# Patient Record
Sex: Female | Born: 1935 | Race: White | Hispanic: No | Marital: Married | Smoking: Never smoker
Health system: Southern US, Community
[De-identification: ages and names within clinical notes are randomized; demographics above are authoritative.]

## PROBLEM LIST (undated history)

## (undated) DIAGNOSIS — N189 Chronic kidney disease, unspecified: Secondary | ICD-10-CM

## (undated) DIAGNOSIS — E876 Hypokalemia: Secondary | ICD-10-CM

## (undated) DIAGNOSIS — G4733 Obstructive sleep apnea (adult) (pediatric): Principal | ICD-10-CM

## (undated) DIAGNOSIS — I4891 Unspecified atrial fibrillation: Secondary | ICD-10-CM

## (undated) DIAGNOSIS — I499 Cardiac arrhythmia, unspecified: Secondary | ICD-10-CM

## (undated) DIAGNOSIS — C49A2 Gastrointestinal stromal tumor of stomach: Secondary | ICD-10-CM

## (undated) DIAGNOSIS — Z9989 Dependence on other enabling machines and devices: Principal | ICD-10-CM

## (undated) DIAGNOSIS — D414 Neoplasm of uncertain behavior of bladder: Secondary | ICD-10-CM

## (undated) DIAGNOSIS — I10 Essential (primary) hypertension: Secondary | ICD-10-CM

## (undated) DIAGNOSIS — R7301 Impaired fasting glucose: Secondary | ICD-10-CM

## (undated) DIAGNOSIS — N841 Polyp of cervix uteri: Secondary | ICD-10-CM

## (undated) DIAGNOSIS — H269 Unspecified cataract: Secondary | ICD-10-CM

## (undated) DIAGNOSIS — I214 Non-ST elevation (NSTEMI) myocardial infarction: Secondary | ICD-10-CM

## (undated) DIAGNOSIS — M1990 Unspecified osteoarthritis, unspecified site: Secondary | ICD-10-CM

## (undated) DIAGNOSIS — K589 Irritable bowel syndrome without diarrhea: Secondary | ICD-10-CM

## (undated) DIAGNOSIS — M8580 Other specified disorders of bone density and structure, unspecified site: Secondary | ICD-10-CM

## (undated) DIAGNOSIS — I2510 Atherosclerotic heart disease of native coronary artery without angina pectoris: Secondary | ICD-10-CM

## (undated) DIAGNOSIS — K449 Diaphragmatic hernia without obstruction or gangrene: Secondary | ICD-10-CM

## (undated) DIAGNOSIS — E785 Hyperlipidemia, unspecified: Secondary | ICD-10-CM

## (undated) DIAGNOSIS — C801 Malignant (primary) neoplasm, unspecified: Secondary | ICD-10-CM

## (undated) DIAGNOSIS — R32 Unspecified urinary incontinence: Secondary | ICD-10-CM

## (undated) DIAGNOSIS — K219 Gastro-esophageal reflux disease without esophagitis: Secondary | ICD-10-CM

## (undated) HISTORY — DX: Non-ST elevation (NSTEMI) myocardial infarction: I21.4

## (undated) HISTORY — PX: COLONOSCOPY: SHX174

## (undated) HISTORY — DX: Irritable bowel syndrome, unspecified: K58.9

## (undated) HISTORY — DX: Dependence on other enabling machines and devices: Z99.89

## (undated) HISTORY — PX: UPPER GI ENDOSCOPY: SHX6162

## (undated) HISTORY — DX: Impaired fasting glucose: R73.01

## (undated) HISTORY — DX: Neoplasm of uncertain behavior of bladder: D41.4

## (undated) HISTORY — DX: Other specified disorders of bone density and structure, unspecified site: M85.80

## (undated) HISTORY — DX: Polyp of cervix uteri: N84.1

## (undated) HISTORY — DX: Essential (primary) hypertension: I10

## (undated) HISTORY — PX: CARDIAC CATHETERIZATION: SHX172

## (undated) HISTORY — DX: Hyperlipidemia, unspecified: E78.5

## (undated) HISTORY — PX: EYE SURGERY: SHX253

## (undated) HISTORY — DX: Unspecified urinary incontinence: R32

## (undated) HISTORY — DX: Obstructive sleep apnea (adult) (pediatric): G47.33

## (undated) HISTORY — DX: Unspecified atrial fibrillation: I48.91

## (undated) HISTORY — DX: Unspecified cataract: H26.9

## (undated) HISTORY — DX: Gastrointestinal stromal tumor of stomach: C49.A2

## (undated) HISTORY — DX: Diaphragmatic hernia without obstruction or gangrene: K44.9

## (undated) HISTORY — DX: Atherosclerotic heart disease of native coronary artery without angina pectoris: I25.10

## (undated) HISTORY — DX: Hypokalemia: E87.6

---

## 1997-09-16 ENCOUNTER — Ambulatory Visit (HOSPITAL_COMMUNITY): Admission: RE | Admit: 1997-09-16 | Discharge: 1997-09-16 | Payer: Self-pay | Admitting: Cardiology

## 1997-09-21 ENCOUNTER — Other Ambulatory Visit: Admission: RE | Admit: 1997-09-21 | Discharge: 1997-09-21 | Payer: Self-pay | Admitting: Cardiology

## 1998-09-26 ENCOUNTER — Other Ambulatory Visit: Admission: RE | Admit: 1998-09-26 | Discharge: 1998-09-26 | Payer: Self-pay | Admitting: Cardiology

## 1999-08-23 ENCOUNTER — Other Ambulatory Visit: Admission: RE | Admit: 1999-08-23 | Discharge: 1999-08-23 | Payer: Self-pay | Admitting: Cardiology

## 2001-03-06 ENCOUNTER — Encounter: Payer: Self-pay | Admitting: Internal Medicine

## 2004-02-27 HISTORY — PX: ANKLE FRACTURE SURGERY: SHX122

## 2005-12-11 ENCOUNTER — Encounter: Admission: RE | Admit: 2005-12-11 | Discharge: 2005-12-11 | Payer: Self-pay | Admitting: Internal Medicine

## 2006-10-15 ENCOUNTER — Ambulatory Visit: Payer: Self-pay | Admitting: Internal Medicine

## 2006-10-22 ENCOUNTER — Ambulatory Visit: Payer: Self-pay | Admitting: Internal Medicine

## 2006-11-26 ENCOUNTER — Ambulatory Visit: Payer: Self-pay | Admitting: Internal Medicine

## 2006-12-26 ENCOUNTER — Encounter: Payer: Self-pay | Admitting: Gastroenterology

## 2006-12-26 ENCOUNTER — Ambulatory Visit (HOSPITAL_COMMUNITY): Admission: RE | Admit: 2006-12-26 | Discharge: 2006-12-26 | Payer: Self-pay | Admitting: Gastroenterology

## 2007-01-01 ENCOUNTER — Ambulatory Visit: Payer: Self-pay | Admitting: Gastroenterology

## 2007-01-03 ENCOUNTER — Ambulatory Visit: Payer: Self-pay | Admitting: Internal Medicine

## 2007-01-13 ENCOUNTER — Ambulatory Visit: Payer: Self-pay | Admitting: Cardiology

## 2007-03-26 ENCOUNTER — Inpatient Hospital Stay (HOSPITAL_COMMUNITY): Admission: RE | Admit: 2007-03-26 | Discharge: 2007-03-30 | Payer: Self-pay | Admitting: Surgery

## 2007-03-26 ENCOUNTER — Encounter (INDEPENDENT_AMBULATORY_CARE_PROVIDER_SITE_OTHER): Payer: Self-pay | Admitting: Surgery

## 2007-03-26 HISTORY — PX: OTHER SURGICAL HISTORY: SHX169

## 2007-04-16 ENCOUNTER — Ambulatory Visit: Payer: Self-pay | Admitting: Oncology

## 2007-04-25 LAB — CBC WITH DIFFERENTIAL/PLATELET
Basophils Absolute: 0 10*3/uL (ref 0.0–0.1)
Eosinophils Absolute: 0.1 10*3/uL (ref 0.0–0.5)
HCT: 38.1 % (ref 34.8–46.6)
HGB: 13 g/dL (ref 11.6–15.9)
MCV: 85.3 fL (ref 81.0–101.0)
MONO%: 6.5 % (ref 0.0–13.0)
NEUT#: 3.4 10*3/uL (ref 1.5–6.5)
Platelets: 224 10*3/uL (ref 145–400)
RDW: 13.4 % (ref 11.3–14.5)

## 2007-04-25 LAB — COMPREHENSIVE METABOLIC PANEL
Albumin: 4.1 g/dL (ref 3.5–5.2)
Alkaline Phosphatase: 59 U/L (ref 39–117)
BUN: 18 mg/dL (ref 6–23)
Calcium: 9.6 mg/dL (ref 8.4–10.5)
Glucose, Bld: 98 mg/dL (ref 70–99)
Potassium: 3.6 mEq/L (ref 3.5–5.3)

## 2007-05-06 DIAGNOSIS — K219 Gastro-esophageal reflux disease without esophagitis: Secondary | ICD-10-CM

## 2007-05-06 DIAGNOSIS — E785 Hyperlipidemia, unspecified: Secondary | ICD-10-CM | POA: Insufficient documentation

## 2007-05-06 DIAGNOSIS — I10 Essential (primary) hypertension: Secondary | ICD-10-CM

## 2007-05-06 DIAGNOSIS — K589 Irritable bowel syndrome without diarrhea: Secondary | ICD-10-CM

## 2007-07-22 ENCOUNTER — Ambulatory Visit: Payer: Self-pay | Admitting: Oncology

## 2007-07-24 ENCOUNTER — Encounter: Payer: Self-pay | Admitting: Internal Medicine

## 2007-07-24 LAB — CBC WITH DIFFERENTIAL/PLATELET
Basophils Absolute: 0.1 10*3/uL (ref 0.0–0.1)
Eosinophils Absolute: 0.1 10*3/uL (ref 0.0–0.5)
HCT: 38.5 % (ref 34.8–46.6)
HGB: 12.9 g/dL (ref 11.6–15.9)
MCH: 28.8 pg (ref 26.0–34.0)
MONO#: 0.4 10*3/uL (ref 0.1–0.9)
NEUT#: 2.1 10*3/uL (ref 1.5–6.5)
NEUT%: 47.6 % (ref 39.6–76.8)
RDW: 13.5 % (ref 11.3–14.5)
WBC: 4.3 10*3/uL (ref 3.9–10.0)
lymph#: 1.7 10*3/uL (ref 0.9–3.3)

## 2007-07-24 LAB — COMPREHENSIVE METABOLIC PANEL
AST: 18 U/L (ref 0–37)
Alkaline Phosphatase: 51 U/L (ref 39–117)
BUN: 21 mg/dL (ref 6–23)
Calcium: 9.6 mg/dL (ref 8.4–10.5)
Creatinine, Ser: 0.88 mg/dL (ref 0.40–1.20)
Total Bilirubin: 0.5 mg/dL (ref 0.3–1.2)

## 2007-08-31 ENCOUNTER — Inpatient Hospital Stay (HOSPITAL_COMMUNITY): Admission: EM | Admit: 2007-08-31 | Discharge: 2007-09-04 | Payer: Self-pay

## 2007-09-02 ENCOUNTER — Encounter (INDEPENDENT_AMBULATORY_CARE_PROVIDER_SITE_OTHER): Payer: Self-pay | Admitting: General Surgery

## 2007-09-02 ENCOUNTER — Ambulatory Visit: Payer: Self-pay | Admitting: Vascular Surgery

## 2007-09-03 ENCOUNTER — Ambulatory Visit: Payer: Self-pay | Admitting: Physical Medicine & Rehabilitation

## 2007-09-04 ENCOUNTER — Inpatient Hospital Stay (HOSPITAL_COMMUNITY)
Admission: RE | Admit: 2007-09-04 | Discharge: 2007-09-11 | Payer: Self-pay | Admitting: Physical Medicine & Rehabilitation

## 2007-09-16 ENCOUNTER — Ambulatory Visit: Admission: RE | Admit: 2007-09-16 | Discharge: 2007-09-16 | Payer: Self-pay | Admitting: Orthopedic Surgery

## 2007-09-16 ENCOUNTER — Encounter (INDEPENDENT_AMBULATORY_CARE_PROVIDER_SITE_OTHER): Payer: Self-pay | Admitting: Orthopedic Surgery

## 2007-10-22 ENCOUNTER — Ambulatory Visit: Payer: Self-pay | Admitting: Oncology

## 2007-10-24 ENCOUNTER — Encounter: Payer: Self-pay | Admitting: Gastroenterology

## 2007-10-24 LAB — CBC WITH DIFFERENTIAL/PLATELET
BASO%: 2 % (ref 0.0–2.0)
Basophils Absolute: 0.1 10*3/uL (ref 0.0–0.1)
EOS%: 4.8 % (ref 0.0–7.0)
HCT: 36.4 % (ref 34.8–46.6)
LYMPH%: 34.1 % (ref 14.0–48.0)
MCH: 28.4 pg (ref 26.0–34.0)
MCHC: 33.2 g/dL (ref 32.0–36.0)
MONO#: 0.5 10*3/uL (ref 0.1–0.9)
NEUT%: 47.7 % (ref 39.6–76.8)
Platelets: 217 10*3/uL (ref 145–400)

## 2007-10-24 LAB — COMPREHENSIVE METABOLIC PANEL
ALT: <8 U/L (ref 0–35)
BUN: 19 mg/dL (ref 6–23)
CO2: 28 mEq/L (ref 19–32)
Creatinine, Ser: 0.87 mg/dL (ref 0.40–1.20)
Total Bilirubin: 0.5 mg/dL (ref 0.3–1.2)

## 2007-10-24 LAB — LACTATE DEHYDROGENASE: LDH: 150 U/L (ref 94–250)

## 2007-12-02 ENCOUNTER — Ambulatory Visit: Payer: Self-pay | Admitting: Internal Medicine

## 2007-12-10 ENCOUNTER — Ambulatory Visit: Payer: Self-pay | Admitting: Internal Medicine

## 2007-12-22 ENCOUNTER — Ambulatory Visit: Payer: Self-pay | Admitting: Internal Medicine

## 2008-01-20 ENCOUNTER — Ambulatory Visit: Payer: Self-pay | Admitting: Oncology

## 2008-03-02 ENCOUNTER — Encounter: Payer: Self-pay | Admitting: Internal Medicine

## 2008-03-02 LAB — COMPREHENSIVE METABOLIC PANEL
ALT: 8 U/L (ref 0–35)
Alkaline Phosphatase: 70 U/L (ref 39–117)
Sodium: 141 mEq/L (ref 135–145)
Total Bilirubin: 0.5 mg/dL (ref 0.3–1.2)
Total Protein: 7.4 g/dL (ref 6.0–8.3)

## 2008-03-02 LAB — CBC WITH DIFFERENTIAL/PLATELET
Basophils Absolute: 0 10*3/uL (ref 0.0–0.1)
Eosinophils Absolute: 0.1 10*3/uL (ref 0.0–0.5)
HGB: 13.4 g/dL (ref 11.6–15.9)
LYMPH%: 35.7 % (ref 14.0–48.0)
MCV: 86.7 fL (ref 81.0–101.0)
MONO%: 8 % (ref 0.0–13.0)
NEUT#: 2.9 10*3/uL (ref 1.5–6.5)
Platelets: 217 10*3/uL (ref 145–400)
RDW: 13.8 % (ref 11.3–14.5)

## 2008-03-22 ENCOUNTER — Ambulatory Visit (HOSPITAL_COMMUNITY): Admission: RE | Admit: 2008-03-22 | Discharge: 2008-03-22 | Payer: Self-pay | Admitting: Internal Medicine

## 2008-09-01 ENCOUNTER — Ambulatory Visit: Payer: Self-pay | Admitting: Oncology

## 2008-09-16 ENCOUNTER — Encounter: Payer: Self-pay | Admitting: Internal Medicine

## 2008-09-16 LAB — CBC WITH DIFFERENTIAL/PLATELET
Basophils Absolute: 0 10*3/uL (ref 0.0–0.1)
EOS%: 2 % (ref 0.0–7.0)
HCT: 38.3 % (ref 34.8–46.6)
HGB: 13 g/dL (ref 11.6–15.9)
MCH: 29.8 pg (ref 25.1–34.0)
MCV: 88 fL (ref 79.5–101.0)
MONO%: 7.8 % (ref 0.0–14.0)
NEUT%: 54.7 % (ref 38.4–76.8)
RDW: 12.9 % (ref 11.2–14.5)

## 2008-09-16 LAB — COMPREHENSIVE METABOLIC PANEL
AST: 20 U/L (ref 0–37)
Alkaline Phosphatase: 51 U/L (ref 39–117)
BUN: 19 mg/dL (ref 6–23)
Creatinine, Ser: 0.91 mg/dL (ref 0.40–1.20)
Total Bilirubin: 0.6 mg/dL (ref 0.3–1.2)
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
## 2009-02-01 ENCOUNTER — Inpatient Hospital Stay (HOSPITAL_COMMUNITY): Admission: EM | Admit: 2009-02-01 | Discharge: 2009-02-02 | Payer: Self-pay | Admitting: Internal Medicine

## 2009-03-10 ENCOUNTER — Ambulatory Visit: Payer: Self-pay | Admitting: Oncology

## 2009-03-14 ENCOUNTER — Ambulatory Visit (HOSPITAL_COMMUNITY): Admission: RE | Admit: 2009-03-14 | Discharge: 2009-03-14 | Payer: Self-pay | Admitting: Oncology

## 2009-03-14 LAB — CBC WITH DIFFERENTIAL/PLATELET
Basophils Absolute: 0 10*3/uL (ref 0.0–0.1)
Eosinophils Absolute: 0.1 10*3/uL (ref 0.0–0.5)
HCT: 37.8 % (ref 34.8–46.6)
HGB: 12.8 g/dL (ref 11.6–15.9)
LYMPH%: 30.8 % (ref 14.0–49.7)
MCV: 89.6 fL (ref 79.5–101.0)
MONO#: 0.4 10*3/uL (ref 0.1–0.9)
MONO%: 8.7 % (ref 0.0–14.0)
NEUT#: 2.6 10*3/uL (ref 1.5–6.5)
NEUT%: 57.4 % (ref 38.4–76.8)
Platelets: 211 10*3/uL (ref 145–400)
RBC: 4.21 10*6/uL (ref 3.70–5.45)
WBC: 4.5 10*3/uL (ref 3.9–10.3)

## 2009-03-14 LAB — COMPREHENSIVE METABOLIC PANEL
Alkaline Phosphatase: 42 U/L (ref 39–117)
BUN: 22 mg/dL (ref 6–23)
CO2: 31 mEq/L (ref 19–32)
Glucose, Bld: 88 mg/dL (ref 70–99)
Total Bilirubin: 0.7 mg/dL (ref 0.3–1.2)
Total Protein: 6.8 g/dL (ref 6.0–8.3)

## 2009-03-14 LAB — LACTATE DEHYDROGENASE: LDH: 146 U/L (ref 94–250)

## 2009-03-21 ENCOUNTER — Encounter: Payer: Self-pay | Admitting: Internal Medicine

## 2009-08-11 IMAGING — CT CT CHEST W/ CM
2 of 10 series · 10 of 46 positions shown, 17 images · IV contrast (APPLIED)
Comparison: 08/31/2007.  CT abdomen 01/13/2007.

CT CHEST

Addendum Begins

The first sentence of the second paragraph of the CT abdomen
findings section should read:
"The tip of the cecum is located at the level of the anatomic
pelvis - the iliac wings, and SO scanning had to be extended to the
pelvis in order to evaluate the appendix on this exam."
Addendum Ends
CLINICAL DATA: 73-year-old female with history of lung nodules,
thyroid nodules and questionable abnormality of the distal appendix
found on post trauma CT imaging 08/31/2007.  The patient reports
history of a small tumor (not otherwise specified) removed from her
abdomen just above the umbilicus.
CT CHEST, ABDOMEN AND PELVIS WITH CONTRAST
TECHNIQUE: Multidetector CT imaging of the chest, abdomen and
pelvis was performed following the standard protocol during bolus
administration of intravenous contrast.
Contrast: 100 ml [id number].

[Series 5: abdomen 5.0 b31f · axial · 0.66mm/px · z∈[-452,-222]mm · 9 of 58 slices shown, 15 images]
[im 6/58  soft-tissue]
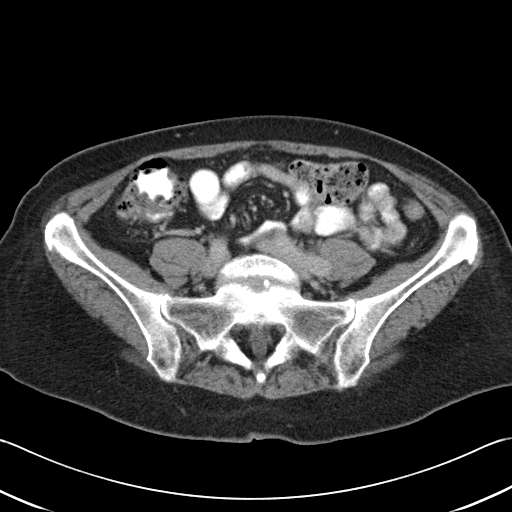
[im 6/58  bone]
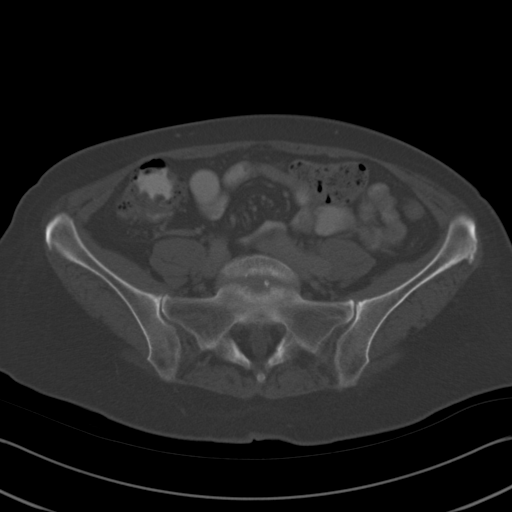
[im 11/58  soft-tissue]
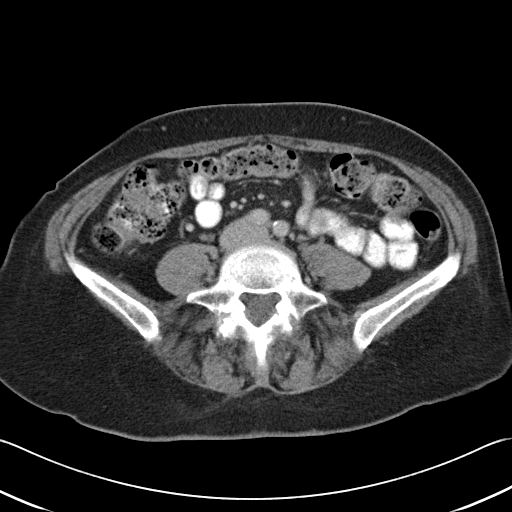
[im 16/58  soft-tissue]
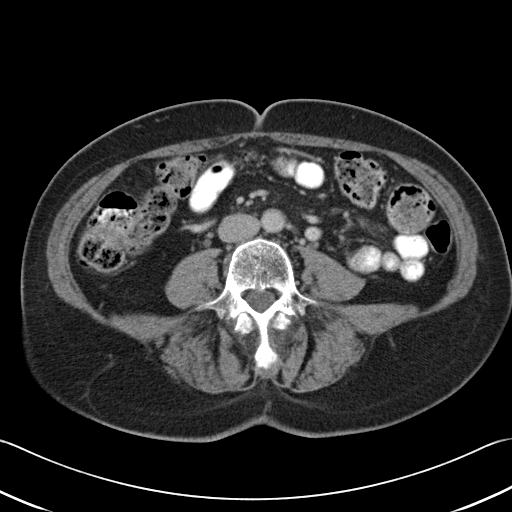
[im 21/58  soft-tissue]
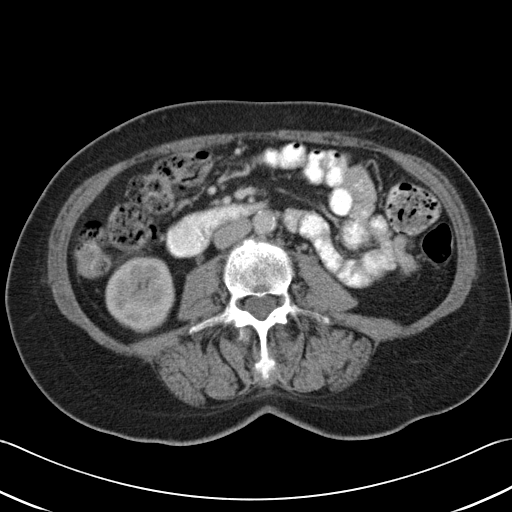
[im 32/58  soft-tissue]
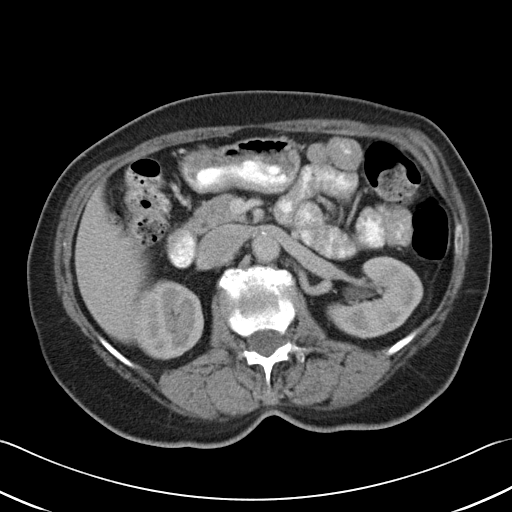
[im 37/58  soft-tissue]
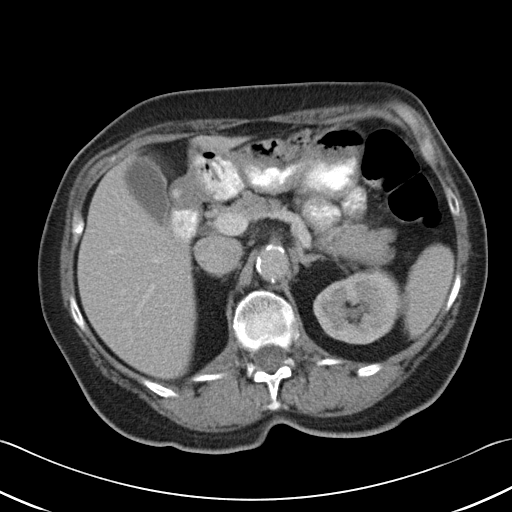
[im 37/58  lung]
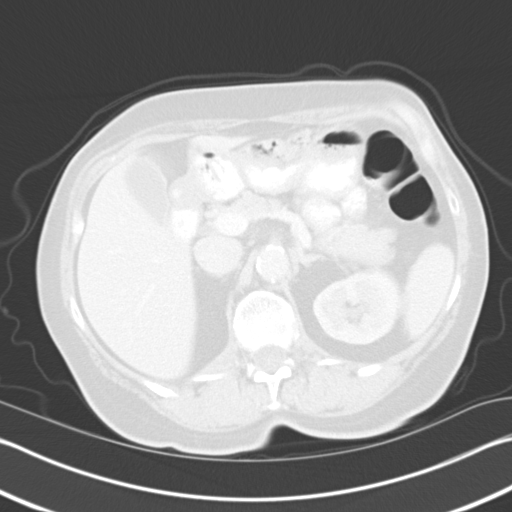
[im 42/58  soft-tissue]
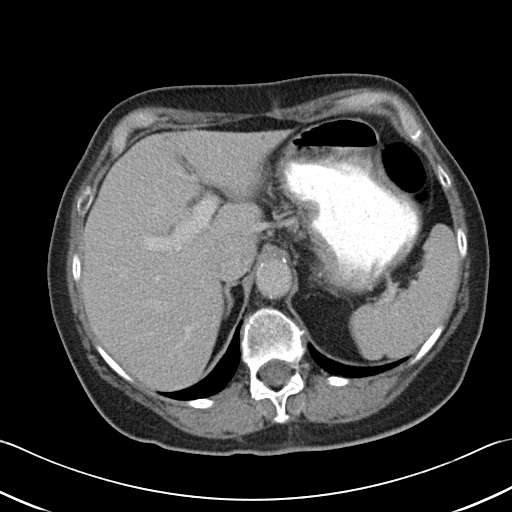
[im 42/58  lung]
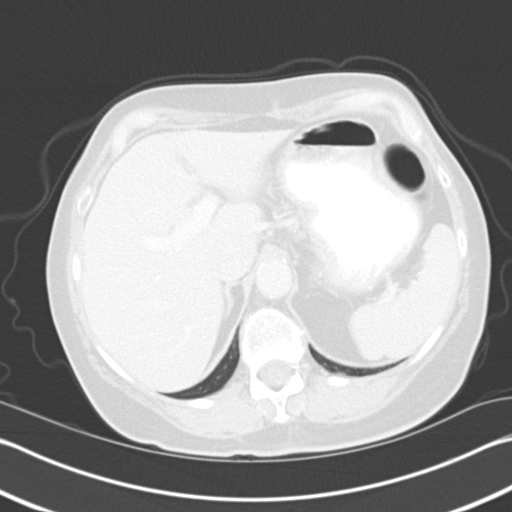
[im 47/58  soft-tissue]
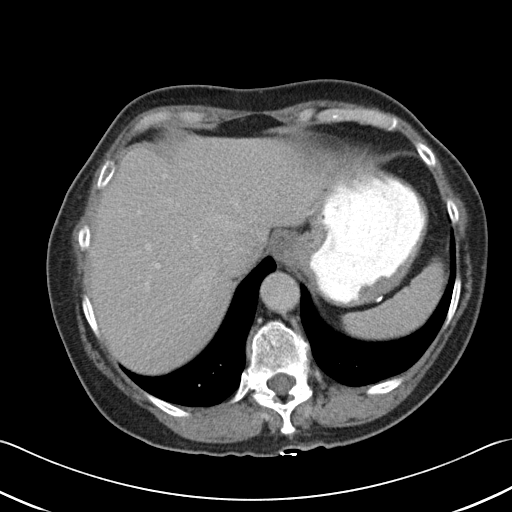
[im 47/58  lung]
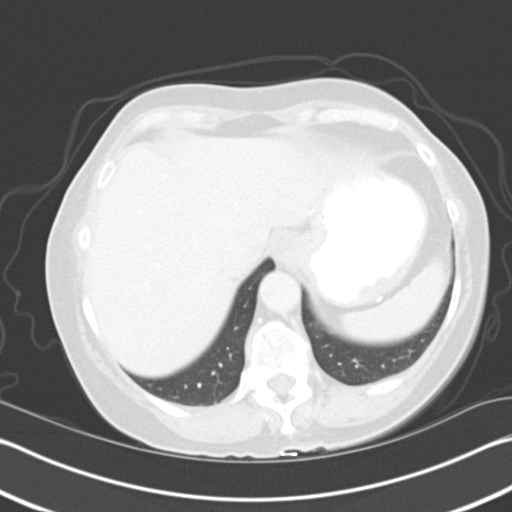
[im 52/58  soft-tissue]
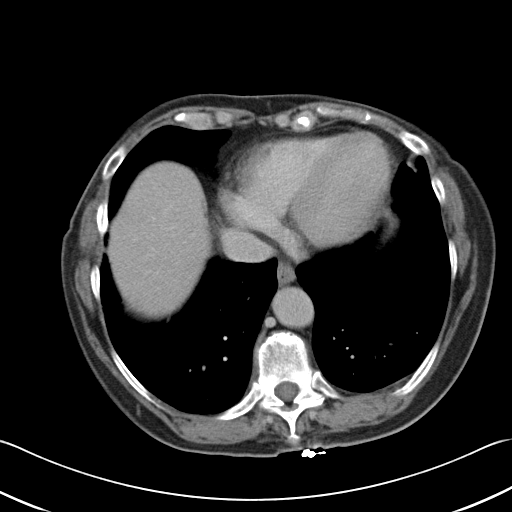
[im 52/58  lung]
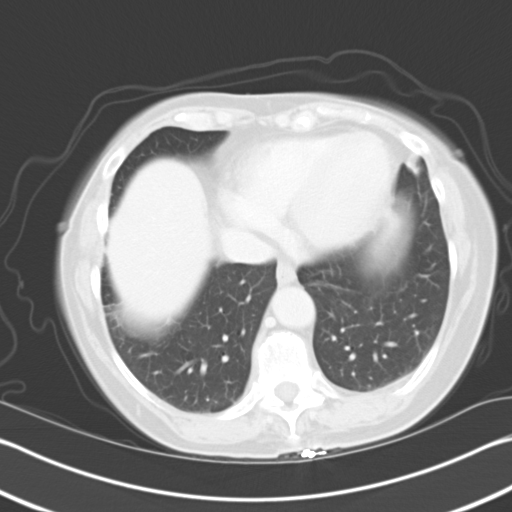
[im 52/58  bone]
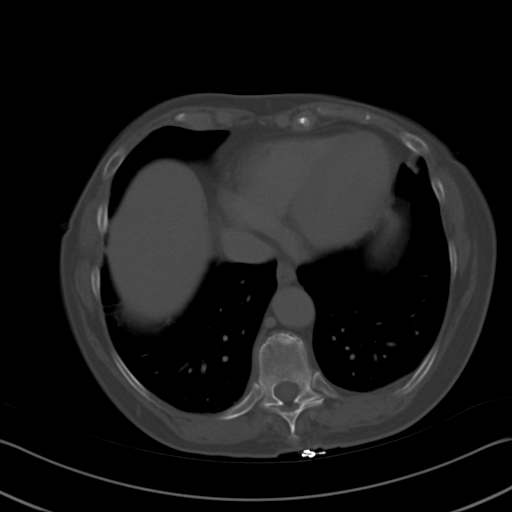

[Series 604: coronal abd. · coronal · 0.66mm/px · 1 of 66 slices shown, 2 images]
[im 33/66  soft-tissue]
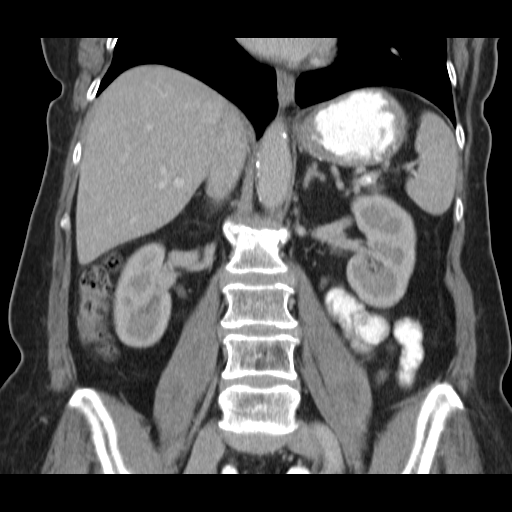
[im 33/66  bone]
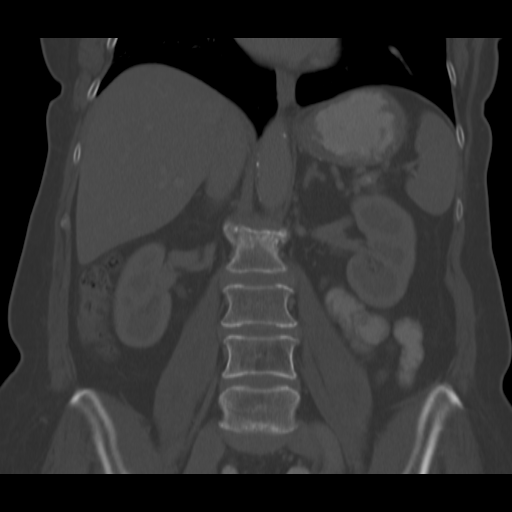

[10 of 46 positions shown; findings below may reference images not displayed]

FINDINGS: Bronchiectasis is evident in the posterior segment of
the right upper lobe, and on the prior study in these bronchi were
impacted with mucus or debris, now more clear.  Small tree in bud
opacities are now seen along the right major fissure just above the
level.  Unchanged appearance of the medial segment of the right
middle lobe with probable scarring or chronic atelectasis.  No
airspace consolidation in the right lung.  Decreased dependent
atelectasis.

Major airways are patent.

3 mm nodule in the lateral basal segment of the left lower lobe
appears new.  Decreased dependent atelectasis in the left lung.
Stable intrabronchial opacity in the superior segment of the left
lower lobe (series 3 image 23).  Stable scarring or atelectasis in
the lingula.  No airspace consolidation in the left lung.

No acute osseous abnormality identified.  Stable tiny right thyroid
nodule of doubtful clinical significance.  No pericardial or
pleural effusion.  No mediastinal or axillary lymphadenopathy.
Stable major vascular structures in the chest.  Stable breast
parenchymal soft tissue.
IMPRESSION: 1.  Changes in both lungs suggestive of post
infectious/inflammatory sequelae.  New tree in bud opacity in the
posterior segment of the right upper lobe is suspicious for acute
infection of distal airways.  Some of the nodules seen on the prior
exam appear to have represented distal impacted bronchiectatic
airways and some of those have cleared, while others have not.
Still, given the interval stability, infectious/inflammatory
etiology is favored. If the patient has a history smoking or other
known risk factors for lung cancer, follow-up chest CT is
recommended in 6 months to 1 year from now.

2.  Otherwise negative chest.
3.  See abdomen findings below.

CT ABDOMEN
FINDINGS: The liver, gallbladder, spleen, pancreas, adrenal
glands, kidneys, stomach, duodenum and proximal small bowel loops
are stable and within normal limits.  Portal venous system remains
patent.  Calcified atherosclerosis of the aorta and major branch
vessels is re-identified.  Visualized distal small bowel loops are
within normal limits.  There is retained stool throughout the colon
which is otherwise within normal limits.  Chronic spondylolisthesis
and spondylolysis at L5-L1 appears stable. No acute osseous
abnormality identified.

The tip of the cecum is located at the level of the anatomic pelvis
- the iliac wings, and no scanning had to be extended to the pelvis
in order to evaluate the appendix on this exam.  The appendix
appears within normal limits on today's exam, the tip is less
indistinct, and a remains nondilated without inflammatory changes.
No free fluid.  No lymphadenopathy.
IMPRESSION: 1.  No acute findings in the abdomen.
2.  The appendix has a normal appearance on today's exam.
3.  Stable chronic L5-S1 spondylolisthesis and spondylolysis.

CT PELVIS
FINDINGS: Contrast opacified distal small bowel loops primarily
occupies the pelvis and are within normal limits.  The uterus and
adnexa, bladder and distal colon are within normal limits.  No free
fluid.  No lymphadenopathy identified. No acute osseous abnormality
identified.
IMPRESSION: No acute findings in the pelvis.

## 2009-09-15 ENCOUNTER — Ambulatory Visit: Payer: Self-pay | Admitting: Oncology

## 2009-09-19 ENCOUNTER — Encounter: Payer: Self-pay | Admitting: Internal Medicine

## 2009-09-19 LAB — CBC WITH DIFFERENTIAL/PLATELET
BASO%: 0.9 % (ref 0.0–2.0)
Eosinophils Absolute: 0.1 10*3/uL (ref 0.0–0.5)
HCT: 37.1 % (ref 34.8–46.6)
LYMPH%: 34.6 % (ref 14.0–49.7)
MCHC: 34.3 g/dL (ref 31.5–36.0)
MONO#: 0.7 10*3/uL (ref 0.1–0.9)
NEUT#: 2.6 10*3/uL (ref 1.5–6.5)
NEUT%: 49.1 % (ref 38.4–76.8)
Platelets: 195 10*3/uL (ref 145–400)
RBC: 4.25 10*6/uL (ref 3.70–5.45)
WBC: 5.4 10*3/uL (ref 3.9–10.3)
lymph#: 1.9 10*3/uL (ref 0.9–3.3)

## 2009-09-19 LAB — COMPREHENSIVE METABOLIC PANEL
ALT: 12 U/L (ref 0–35)
CO2: 31 mEq/L (ref 19–32)
Calcium: 10 mg/dL (ref 8.4–10.5)
Chloride: 102 mEq/L (ref 96–112)
Glucose, Bld: 91 mg/dL (ref 70–99)
Sodium: 139 mEq/L (ref 135–145)
Total Bilirubin: 0.8 mg/dL (ref 0.3–1.2)
Total Protein: 7 g/dL (ref 6.0–8.3)

## 2009-09-19 LAB — LACTATE DEHYDROGENASE: LDH: 158 U/L (ref 94–250)

## 2009-11-21 ENCOUNTER — Ambulatory Visit: Payer: Self-pay | Admitting: Cardiovascular Disease

## 2010-02-01 ENCOUNTER — Ambulatory Visit: Payer: Self-pay | Admitting: Cardiovascular Disease

## 2010-02-01 DIAGNOSIS — I214 Non-ST elevation (NSTEMI) myocardial infarction: Secondary | ICD-10-CM

## 2010-02-01 HISTORY — DX: Non-ST elevation (NSTEMI) myocardial infarction: I21.4

## 2010-03-17 ENCOUNTER — Encounter
Admission: RE | Admit: 2010-03-17 | Discharge: 2010-03-17 | Payer: Self-pay | Source: Home / Self Care | Attending: Internal Medicine | Admitting: Internal Medicine

## 2010-03-17 ENCOUNTER — Ambulatory Visit: Payer: Self-pay | Admitting: Oncology

## 2010-03-19 ENCOUNTER — Encounter (HOSPITAL_COMMUNITY): Payer: Self-pay | Admitting: Oncology

## 2010-03-21 ENCOUNTER — Encounter: Payer: Self-pay | Admitting: Gastroenterology

## 2010-03-21 LAB — CBC WITH DIFFERENTIAL/PLATELET
BASO%: 1 % (ref 0.0–2.0)
EOS%: 3.1 % (ref 0.0–7.0)
LYMPH%: 33.1 % (ref 14.0–49.7)
MCH: 29 pg (ref 25.1–34.0)
MCHC: 33.2 g/dL (ref 31.5–36.0)
MONO#: 0.6 10*3/uL (ref 0.1–0.9)
Platelets: 212 10*3/uL (ref 145–400)
RBC: 4.21 10*6/uL (ref 3.70–5.45)
WBC: 4.8 10*3/uL (ref 3.9–10.3)
lymph#: 1.6 10*3/uL (ref 0.9–3.3)

## 2010-03-21 LAB — COMPREHENSIVE METABOLIC PANEL
ALT: 9 U/L (ref 0–35)
AST: 16 U/L (ref 0–37)
Alkaline Phosphatase: 44 U/L (ref 39–117)
Calcium: 9.7 mg/dL (ref 8.4–10.5)
Chloride: 102 mEq/L (ref 96–112)
Creatinine, Ser: 0.95 mg/dL (ref 0.40–1.20)

## 2010-03-21 LAB — LACTATE DEHYDROGENASE: LDH: 179 U/L (ref 94–250)

## 2010-03-28 ENCOUNTER — Ambulatory Visit (HOSPITAL_COMMUNITY)
Admission: RE | Admit: 2010-03-28 | Discharge: 2010-03-28 | Payer: Self-pay | Source: Home / Self Care | Attending: Oncology | Admitting: Oncology

## 2010-03-28 NOTE — Letter (Signed)
[facility]   Imported By: Lennie Odor 10/12/2009 14:44:04  _____________________________________________________________________  External Attachment:    Type:   Image     Comment:   External Document

## 2010-03-28 NOTE — Letter (Signed)
[facility]   Imported By: Lester Swan 04/12/2009 08:46:30  _____________________________________________________________________  External Attachment:    Type:   Image     Comment:   External Document

## 2010-04-25 NOTE — Letter (Signed)
[facility]   Imported By: Lester Rosenberg 04/18/2010 10:19:06  _____________________________________________________________________  External Attachment:    Type:   Image     Comment:   External Document

## 2010-05-30 LAB — CBC
HCT: 39.1 % (ref 36.0–46.0)
Hemoglobin: 12.6 g/dL (ref 12.0–15.0)
Hemoglobin: 13.4 g/dL (ref 12.0–15.0)
MCHC: 34.3 g/dL (ref 30.0–36.0)
MCHC: 34.7 g/dL (ref 30.0–36.0)
Platelets: 193 10*3/uL (ref 150–400)
Platelets: 197 10*3/uL (ref 150–400)
RBC: 4.07 MIL/uL (ref 3.87–5.11)
RBC: 4.22 MIL/uL (ref 3.87–5.11)
RDW: 12.8 % (ref 11.5–15.5)
WBC: 4.1 10*3/uL (ref 4.0–10.5)
WBC: 6.6 10*3/uL (ref 4.0–10.5)

## 2010-05-30 LAB — DIFFERENTIAL
Basophils Absolute: 0.1 10*3/uL (ref 0.0–0.1)
Basophils Relative: 2 % — ABNORMAL HIGH (ref 0–1)
Eosinophils Absolute: 0.2 10*3/uL (ref 0.0–0.7)
Monocytes Relative: 9 % (ref 3–12)
Neutro Abs: 4 10*3/uL (ref 1.7–7.7)
Neutrophils Relative %: 60 % (ref 43–77)

## 2010-05-30 LAB — CARDIAC PANEL(CRET KIN+CKTOT+MB+TROPI)
CK, MB: 11.4 ng/mL — ABNORMAL HIGH (ref 0.3–4.0)
CK, MB: 13 ng/mL — ABNORMAL HIGH (ref 0.3–4.0)
Relative Index: 8.1 — ABNORMAL HIGH (ref 0.0–2.5)
Troponin I: 2.31 ng/mL (ref 0.00–0.06)

## 2010-05-30 LAB — CK TOTAL AND CKMB (NOT AT ARMC)
CK, MB: 1.5 ng/mL (ref 0.3–4.0)
CK, MB: 2.2 ng/mL (ref 0.3–4.0)
CK, MB: 3.2 ng/mL (ref 0.3–4.0)
Relative Index: 3 — ABNORMAL HIGH (ref 0.0–2.5)
Relative Index: INVALID (ref 0.0–2.5)
Relative Index: INVALID (ref 0.0–2.5)
Total CK: 108 U/L (ref 7–177)
Total CK: 70 U/L (ref 7–177)
Total CK: 96 U/L (ref 7–177)

## 2010-05-30 LAB — TROPONIN I
Troponin I: 0.03 ng/mL (ref 0.00–0.06)
Troponin I: 0.82 ng/mL (ref 0.00–0.06)
Troponin I: 1.01 ng/mL (ref 0.00–0.06)

## 2010-05-30 LAB — HEPARIN LEVEL (UNFRACTIONATED)
Heparin Unfractionated: 0.21 IU/mL — ABNORMAL LOW (ref 0.30–0.70)
Heparin Unfractionated: 0.37 IU/mL (ref 0.30–0.70)

## 2010-05-30 LAB — BASIC METABOLIC PANEL
BUN: 20 mg/dL (ref 6–23)
CO2: 27 mEq/L (ref 19–32)
Calcium: 8.7 mg/dL (ref 8.4–10.5)
Calcium: 9.9 mg/dL (ref 8.4–10.5)
Creatinine, Ser: 0.77 mg/dL (ref 0.4–1.2)
Creatinine, Ser: 0.81 mg/dL (ref 0.4–1.2)
GFR calc Af Amer: >60 mL/min (ref >60)
GFR calc Af Amer: >60 mL/min (ref >60)
GFR calc non Af Amer: >60 mL/min (ref >60)
GFR calc non Af Amer: >60 mL/min (ref >60)
Sodium: 133 mEq/L — ABNORMAL LOW (ref 135–145)

## 2010-05-30 LAB — PROTIME-INR: Prothrombin Time: 12.9 seconds (ref 11.6–15.2)

## 2010-05-30 LAB — POCT CARDIAC MARKERS

## 2010-07-11 NOTE — Discharge Summary (Signed)
[name], [name]                 ACCOUNT NO.:  [id number]   MEDICAL RECORD NO.:  [id number]          PATIENT TYPE:  INP   LOCATION:  1531                         FACILITY:  [facility]   PHYSICIAN:  Wilmon Arms. Corliss Skains, M.D. DATE OF BIRTH:  Apr 12, 1934   DATE OF ADMISSION:  03/26/2007  DATE OF DISCHARGE:  03/30/2007                               DISCHARGE SUMMARY   ADMISSION DIAGNOSIS:  Gastrointestinal stromal tumor, posterior stomach.   DISCHARGE DIAGNOSIS:  Gastrointestinal stromal tumor, posterior stomach.   PROCEDURE:  Laparoscopic resection of a GI stromal tumor.   BRIEF HISTORY:  The patient is a 76 year old female who was undergoing a  workup for gastroesophageal reflux disease.  An EGD performed in May  showed a submucosal gastric nodule.  This was then examined with an  endoscopic ultrasound and fine-needle aspiration of this area.  The  pathology showed spindle cells consistent with a GI stromal tumor.  She  was then referred for surgical evaluation.   HOSPITAL COURSE:  The patient was brought to the operating room on  March 26, 2007.  She underwent a laparoscopic resection of the GI  stromal tumor.  This was performed with a stapled wedge resection.  Postoperatively the patient has done quite well.  She had some nausea  initially, which was controlled with p.r.n. pain medication.  This  subsequently improved.  The pathology confirmed that the margins were  clear.  Her diet was slowly advanced as her bowel function returned.  She was discharged on postop day #4.  She was tolerating a regular diet  and had had a bowel movement.  Her wounds were all healing well.   DISCHARGE INSTRUCTIONS:  The patient was given p.r.n. pain medication.  Follow up in 2 to 3 weeks in Dr. Fatima Sanger office.  She can resume a  regular diet.  No strenuous activity.      Wilmon Arms. Tsuei, M.D.  Electronically Signed     MKT/MEDQ  D:  04/18/2007  T:  04/19/2007  Job:  [id number]
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
## 2010-07-11 NOTE — Op Note (Signed)
NAMEGreidys, [name]                 ACCOUNT NO.:  [id number]   MEDICAL RECORD NO.:  [id number]          PATIENT TYPE:  INP   LOCATION:  0008                         FACILITY:  [facility]   PHYSICIAN:  Wilmon Arms. Corliss Skains, M.D. DATE OF BIRTH:  01-09-1935   DATE OF PROCEDURE:  03/26/2007  DATE OF DISCHARGE:                               OPERATIVE REPORT   PREOPERATIVE DIAGNOSIS:  Gastrointestinal stromal tumor posterior  stomach.   POSTOPERATIVE DIAGNOSIS:  Gastrointestinal stromal tumor posterior  stomach.   PROCEDURE PERFORMED:  Laparoscopic resection of gastrointestinal stromal  tumor of the stomach.   SURGEON:  Wilmon Arms. Corliss Skains, M.D.   ASSISTANT:  Lennie Muckle, M.D.   ANESTHESIA:  General endotracheal.   INDICATIONS:  The patient is a 76 year old female who recently underwent  a workup for gastroesophageal reflux disease.  She was noted to have a  proximal submucosal gastric nodule.  An endoscopic ultrasound was then  performed.  A fine needle aspiration was performed which showed a  spindle cell lesion consistent with a GI stromal tumor.  CT scan  confirmed these findings.  The mass seems to be about 2 cm across and  was completely submucosal.  The patient presents for elective resection.   DESCRIPTION OF PROCEDURE:  The patient was brought to the operating room  and placed in the supine position on the operating table.  After an  adequate level of general anesthesia was obtained, a Foley catheter was  placed under sterile technique.  Her legs were placed in the lithotomy  position in yellow fin stirrups.  Her abdomen was prepped with Betadine  and draped in a sterile fashion.  A time-out was taken to assure the  proper patient and proper procedure.  A 5 mm was incision was made just  to the left of midline above the umbilicus.  The 5 mm OptiVu trocar was  used to cannulate the peritoneal cavity.  CO2 was insufflated  maintaining a maximum pressure of 15 mmHg.  A 5 mm port was  then placed  in the subxiphoid position.  The trocar was then removed and a Nathanson  retractor was inserted.  This was used to retract the liver superiorly.  It was secured to the bed with the retractor holder.  An additional 5 mm  port was placed in the right upper quadrant.  An 11 mm port was placed  in the left anterior axillary line at the level of the umbilicus.  An  additional 5 mm port was placed in the left upper quadrant.  The stomach  was examined.   We began in the mid portion of the stomach and began dividing the short  gastric vessels.  We continued superiorly until we were at the left crus  of the diaphragm.  We divided some of the posterior attachments.  As we  were rotating the stomach medially to examine the posterior surface, an  exophytic lesion was identified near the greater curvature.  This was  the GI stromal tumor in question.  There seemed to be a subserosal  component to this area.  The area was grasped, clamped, and elevated.  We then used an Systems analyst with green thick tissue load to  divide the stomach to wedge out this area.  Three total loads were used.  The specimen was then placed in an EndoCatch sac and removed.  This was  sent for immediate gross examination by the pathologist.  This showed a  1.8 cm lesion with a 1.5 cm margin from the staple line.  We then  reinspected the staple line.  There was one bleeding area which was  cauterized.  Hemostasis was felt to be good.  We then sealed the entire  staple line with Tisseel.   We inspected for further bleeding.  The 11 mm as well as the 15 mm port  sites were then closed with fascial sutures of 0 Vicryl.  The trocars  were then removed.  We placed several deep sutures of 4-0 Monocryl.  Dermabond was then used to seal the skin incisions.  The patient was  then placed back in the supine position.  She was then extubated and  brought to recovery in stable condition.  All sponge, instrument, and   needle counts were correct.      Wilmon Arms. Tsuei, M.D.  Electronically Signed     MKT/MEDQ  D:  03/26/2007  T:  03/26/2007  Job:  [id number]   cc:   Loraine Leriche A. Perini, M.D.  Fax: [phone]   Rachael Fee, MD  [address]  [place], [place] [address]
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
## 2010-07-11 NOTE — Assessment & Plan Note (Signed)
[facility]                         GASTROENTEROLOGY OFFICE NOTE   NAME:[name], [name]                        MRN:          [id number]  DATE:11/26/2006                            DOB:          07-22-1934    HISTORY:  [name] presents today for followup. She is a 76 year old  evaluated October 15, 2006 for reflux symptoms. See that dictation for  details. She was also having complaints of dysphagia. On October 22, 2006, she underwent upper endoscopy. No obvious stricture noted though  the esophagus was dilated with a 54 [place] Maloney dilator. The  remainder of her examination was remarkable for an incidental proximal  gastric nodule which was submucosal and measured approximately 2-cm. She  was placed on Nexium and presents today for followup.   On Nexium, she has no heartburn or indigestion. No regurgitation. Her  dysphagia has resolved. I discussed with her the findings on endoscopy  as well as her diagnoses and treatment plan.   CURRENT MEDICATIONS:  1. Tiazac.  2. Atenolol.  3. Diovan/hydrochlorothiazide.  4. Lipitor.  5. Zetia.  6. Detrol LA.  7. Evista.  8. Nexium.  9. Aspirin.  10.Calcium.  11.Omega 3.  12.Garlic supplement.  13.Fiber Choice.   PHYSICAL EXAMINATION:  Finds a well-appearing female in no acute  distress. Blood pressure 122/80, heart rate 68. Weight is 172 pounds.  ABDOMEN: Was not reexamined.   IMPRESSION:  1. Gastroesophageal reflux disease. The patient is asymptomatic on      Nexium.  2. Problems with dysphagia. No obvious stricture. Symptoms resolved      post esophageal dilation. Suspect subtle narrowing not appreciated      on endoscopy.  3. Incidental proximal submucosal lesion, rule out a leiomyoma or      GIST.Discussed in detail with patient.   RECOMMENDATIONS:  1. Continue Nexium.  2. Reflux precautions.  3. Schedule endoscopic ultrasound with Dr.  Rob Bunting to further      characterize the  submucosal gastric lesion.She agrees.     [name]. Marina Goodell, MD  Electronically Signed    JNP/MedQ  DD: 11/27/2006  DT: 11/27/2006  Job #: [id number]   cc:   Rachael Fee, MD  [facility] Perini, [name].D.
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
## 2010-07-11 NOTE — Assessment & Plan Note (Signed)
[name]                         GASTROENTEROLOGY OFFICE NOTE   NAME:[name], [name]                        MRN:          [id number]  DATE:10/15/2006                            DOB:          1934-08-07    REASON FOR CONSULTATION:  Reflux symptoms.   HISTORY:  This is a 76 year old white female with a history of  hypertension and dyslipidemia who was referred through the courtesy of  Dr. Waynard [name] regarding reflux. The patient reports developing pyrosis with  water brash several months ago. She has also been having some problems  with chronic throat clearing. She was placed on Prilosec OTC for several  weeks without significant improvement. In May, she was placed on Nexium  40 mg daily. Heartburn and water brash resolved except for rare  occasions. Throat clearing problems continue. She does have minor  intermittent solid food dysphagia. Some occasional chest and epigastric  discomfort associated with these symptoms. She is known to have  irritable bowel syndrome and underwent colonoscopy in January 2003. This  was normal.   PAST MEDICAL HISTORY:  1. Hypertension.  2. Dyslipidemia.  3. Irritable bowel syndrome.   PAST SURGICAL HISTORY:  None.   ALLERGIES:  No known drug allergies.   CURRENT MEDICATIONS:  1. Tiazac 360 mg daily.  2. Atenolol 25 mg b.i.d.  3. Diovan HCT 160/12.5 mg b.i.d.  4. Lipitor 20 mg at night.  5. Zetia 10 mg at night.  6. Detrol LA 4 mg daily.  7. Evista 60 mg daily.  8. Nexium 40 mg before supper.  9. Aspirin 81 mg daily.  10.Calcium 1200 mg daily.  11.Omega 3 1000 mg b.i.d.  12.Garlic supplement.  13.Fiber choice.  14.Ibuprofen p.r.n.   FAMILY HISTORY:  No family history of colon cancer. Grandmother had  pancreatic cancer. Father and mother with heart disease.   SOCIAL HISTORY:  The patient is married and a Quarry manager. She  is retired from [employer]. She does not smoke. She rarely  drinks wine.   REVIEW OF SYSTEMS:  Per diagnostic evaluation form.   PHYSICAL EXAMINATION:  GENERAL:  Well appearing in no acute distress.  VITAL SIGNS:  Blood pressure 126/82, heart rate 60, weight 178 pounds.  She is 5 feet 5 inches in height.  HEENT:  Sclerae anicteric. Conjunctiva are pink. Oral mucosa is intact.  No adenopathy.  LUNGS:  Clear.  HEART:  Regular.  ABDOMEN:  Soft without tenderness, mass, or hernia.   IMPRESSION:  1. Gastroesophageal reflux disease with intermittent dysphagia. Rule      out peptic stricture. For the most part, symptoms are improved on      Nexium. It is unclear whether her throat clearing issues are      related to reflux though they certainly might be related.  2. History of irritable bowel syndrome.  3. General medical problems under the care of Dr. Waynard [name].   RECOMMENDATIONS:  1. Continue Nexium p.o. daily.  2. Reflux precautions.  3. Schedule upper endoscopy with possible esophageal dilation for      dysplasia. The nature of the  procedure, as well as the risks,      benefits, and alternatives have been reviewed. The patient      understood and agreed to proceed.  4. Ongoing general medical care with Dr. Waynard [name].     [name]. Marina Goodell, MD  Electronically Signed    JNP/MedQ  DD: 10/17/2006  DT: 10/19/2006  Job #: [id number]   cc:   Loraine Leriche A. Perini, [name].D.
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
## 2010-08-16 ENCOUNTER — Other Ambulatory Visit: Payer: Self-pay | Admitting: Cardiovascular Disease

## 2010-09-08 ENCOUNTER — Other Ambulatory Visit: Payer: Self-pay | Admitting: Internal Medicine

## 2010-09-08 DIAGNOSIS — R413 Other amnesia: Secondary | ICD-10-CM

## 2010-09-14 ENCOUNTER — Ambulatory Visit
Admission: RE | Admit: 2010-09-14 | Discharge: 2010-09-14 | Disposition: A | Discharge status: Home / Self Care | Payer: Medicare Other | Source: Ambulatory Visit | Attending: Internal Medicine | Admitting: Internal Medicine

## 2010-09-14 DIAGNOSIS — R413 Other amnesia: Secondary | ICD-10-CM

## 2010-09-15 ENCOUNTER — Encounter: Payer: Self-pay | Admitting: *Deleted

## 2010-09-15 DIAGNOSIS — E876 Hypokalemia: Secondary | ICD-10-CM | POA: Insufficient documentation

## 2010-09-15 DIAGNOSIS — C49A2 Gastrointestinal stromal tumor of stomach: Secondary | ICD-10-CM | POA: Insufficient documentation

## 2010-09-15 DIAGNOSIS — R32 Unspecified urinary incontinence: Secondary | ICD-10-CM | POA: Insufficient documentation

## 2010-09-15 DIAGNOSIS — I2510 Atherosclerotic heart disease of native coronary artery without angina pectoris: Secondary | ICD-10-CM | POA: Insufficient documentation

## 2010-09-18 ENCOUNTER — Encounter: Payer: Self-pay | Admitting: Cardiovascular Disease

## 2010-09-18 ENCOUNTER — Ambulatory Visit (INDEPENDENT_AMBULATORY_CARE_PROVIDER_SITE_OTHER): Payer: Medicare Other | Admitting: Cardiovascular Disease

## 2010-09-18 VITALS — BP 138/72 | HR 60 | Ht 64.5 in | Wt 167.4 lb

## 2010-09-18 DIAGNOSIS — R0602 Shortness of breath: Secondary | ICD-10-CM

## 2010-09-18 DIAGNOSIS — R0989 Other specified symptoms and signs involving the circulatory and respiratory systems: Secondary | ICD-10-CM

## 2010-09-18 DIAGNOSIS — R0600 Dyspnea, unspecified: Secondary | ICD-10-CM

## 2010-09-18 NOTE — Assessment & Plan Note (Signed)
And presents with episodes of shortness of breath that are somewhat atypical. She does water aerobics 3 times a week and has never had any episodes of shortness of breath. Her shortness of breath episodes seem to occur when she's bending over which I think is due to her abdomen pushing up her diaphragm. She's not  obese but I think that she is noticing some respiratory compromise due to  diaphragmatic compression.  She also notes severe shortness of breath when she is out in the heat of the day. I suspect this has more to do with the poor quality of the air during these days when it reaches 100. I've asked her to exercise when the temperature is cool. I have asked her to continue exercising in the water since she seems to do very well with that.  We'll get an echocardiogram for further evaluation of her cardiac function. We will also get a full set of pulmonary  function test. She relates being exposed to asbestos many years ago. We may need to explore that further.  We may need to perform a Myoview study if she continues to have symptoms if we do not have an explanation.

## 2010-09-18 NOTE — Progress Notes (Signed)
[name] Date of Birth  02-20-1935 [facility] / [facility] [address]     [address] [place], [place]  [address] [phone]  Fax  [phone]  History of Present Illness:  Mrs. [name] is an elderly female with a history of a non-ST segment elevation myocardial infarction. Cardiac catheterization at that time revealed only minor coronary artery irregularities. Shows a history of hypertension and hyperlipidemia.  Recently she's been having some episodes of worsening shortness of breath. This is especially pronounced when she bends over. Also is worse when she is out of heat. She denies any leg swelling. She denies any angina. She has not been eating any extra salt.  She does water aerobics 3 times a week and has never had any episodes of chest pain or shortness of breath.  She has chronic episodes of chest pain. These episodes are upper abdominal/lower chest pain. They radiate through to her back. He episodes of pain are there constantly and not associated with exercise.   Current Outpatient Prescriptions on File Prior to Visit  Medication Sig Dispense Refill  . alendronate (FOSAMAX) 70 MG tablet Take 70 mg by mouth every 7 (seven) days. Take with a full glass of water on an empty stomach.       [name] aspirin 81 MG tablet Take 81 mg by mouth daily.        [name] atenolol (TENORMIN) 25 MG tablet Take 25 mg by mouth daily.       [name] atorvastatin (LIPITOR) 20 MG tablet Take 20 mg by mouth daily.        . Calcium Carbonate (CALCIUM 600 PO) Take by mouth daily.        . cholecalciferol (VITAMIN D) 1000 UNITS tablet Take 1,000 Units by mouth daily.        [name] diltiazem (CARDIZEM CD) 360 MG 24 hr capsule Take 360 mg by mouth daily.        [name] doxazosin (CARDURA) 4 MG tablet Take 4 mg by mouth at bedtime. Taking 1/2 daily       . ezetimibe (ZETIA) 10 MG tablet Take 10 mg by mouth daily. Taking 1/2 daily       . Multiple Vitamin (MULTIVITAMIN) tablet Take 1 tablet by mouth  daily.        [name] NITROSTAT 0.4 MG SL tablet DISSOLVE 1 TABLET UNDER THE TONGUE AS NEEDED  25 tablet  3  . Nutritional Supplements (CHOICE DM FIBER-BURST PO) Take by mouth 2 (two) times daily.        . Omega-3 Fatty Acids (FISH OIL BURP-LESS) 1200 MG CAPS Take by mouth daily.        . potassium chloride (K-DUR) 10 MEQ tablet Take 10 mEq by mouth daily.        . valsartan-hydrochlorothiazide (DIOVAN HCT) 160-12.5 MG per tablet Take 1 tablet by mouth 2 (two) times daily.          No Known Allergies  Past Medical History  Diagnosis Date  . MI, acute, non ST segment elevation Dec.7,2011    cath  . Coronary artery disease     minimal  . Hypertension   . Hyperlipidemia   . Hypokalemia   . Incontinence of urine   . Stromal tumor of the stomach     Past Surgical History  Procedure Date  . Cardiac catheterization 12/07/20210    Dr.Kyro Joswick,minor cad  . Stromal stomach  tumor 03/26/2007    resection    History  Smoking status  . Never Smoker   Smokeless tobacco  . Not on file    History  Alcohol Use     No family history on file.  Reviw of Systems:  Reviewed in the HPI.  All other systems are negative.  Physical Exam: BP 138/72  Pulse 60  Ht 5' 4.5" (1.638 m)  Wt 167 lb 6.4 oz (75.932 kg)  BMI 28.29 kg/m2  SpO2 94% The patient is alert and oriented x 3.  The mood and affect are normal.   Skin: warm and dry.  Color is normal.    HEENT:   the sclera are nonicteric.  The mucous membranes are moist.  The carotids are 2+ without bruits.  There is no thyromegaly.  There is no JVD.    Lungs: clear.  The chest wall is non tender.    Heart: regular rate with a normal S1 and S2.  There are no murmurs, gallops, or rubs. The PMI is not displaced.     Abdomen: good bowel sounds.  There is no guarding or rebound.  There is no hepatosplenomegaly or tenderness.  There are no masses.   Extremities:  no clubbing, cyanosis, or edema.  The legs are without rashes.  The distal pulses are  intact.   Neuro:  Cranial nerves II - XII are intact.  Motor and sensory functions are intact.    The gait is normal.  ECG: Sinus bradycardia. She has poor R-wave progression consistent with possible anterior wall myocardial infarction. There are no significant changes from her previous EKG.  Assessment / Plan:

## 2010-09-19 ENCOUNTER — Other Ambulatory Visit (HOSPITAL_COMMUNITY): Payer: Self-pay | Admitting: Oncology

## 2010-09-19 ENCOUNTER — Encounter (HOSPITAL_BASED_OUTPATIENT_CLINIC_OR_DEPARTMENT_OTHER): Payer: Medicare Other | Admitting: Oncology

## 2010-09-19 ENCOUNTER — Encounter: Payer: Self-pay | Admitting: Cardiovascular Disease

## 2010-09-19 ENCOUNTER — Telehealth: Payer: Self-pay | Admitting: *Deleted

## 2010-09-19 DIAGNOSIS — J984 Other disorders of lung: Secondary | ICD-10-CM

## 2010-09-19 DIAGNOSIS — C166 Malignant neoplasm of greater curvature of stomach, unspecified: Secondary | ICD-10-CM

## 2010-09-19 LAB — CBC WITH DIFFERENTIAL/PLATELET
BASO%: 0.8 % (ref 0.0–2.0)
EOS%: 3.6 % (ref 0.0–7.0)
HCT: 37.8 % (ref 34.8–46.6)
MCH: 28.7 pg (ref 25.1–34.0)
MCHC: 33.1 g/dL (ref 31.5–36.0)
MONO#: 0.6 10*3/uL (ref 0.1–0.9)
NEUT%: 53.7 % (ref 38.4–76.8)
RBC: 4.35 10*6/uL (ref 3.70–5.45)
WBC: 5 10*3/uL (ref 3.9–10.3)
lymph#: 1.5 10*3/uL (ref 0.9–3.3)

## 2010-09-19 LAB — COMPREHENSIVE METABOLIC PANEL
ALT: 11 U/L (ref 0–35)
AST: 20 U/L (ref 0–37)
Chloride: 102 mEq/L (ref 96–112)
Creatinine, Ser: 1.03 mg/dL (ref 0.50–1.10)
Sodium: 140 mEq/L (ref 135–145)
Total Bilirubin: 0.5 mg/dL (ref 0.3–1.2)
Total Protein: 6.7 g/dL (ref 6.0–8.3)

## 2010-09-19 NOTE — Telephone Encounter (Signed)
PFT's 10/04/10 @11 :30 at [place], pt informed to be there 15 min early.Pt verbalized understanding. Alfonso Ramus RN

## 2010-09-27 ENCOUNTER — Encounter: Payer: Self-pay | Admitting: *Deleted

## 2010-10-02 ENCOUNTER — Ambulatory Visit (HOSPITAL_COMMUNITY): Payer: Medicare Other | Attending: Cardiovascular Disease | Admitting: Radiology

## 2010-10-02 DIAGNOSIS — E785 Hyperlipidemia, unspecified: Secondary | ICD-10-CM | POA: Insufficient documentation

## 2010-10-02 DIAGNOSIS — R5383 Other fatigue: Secondary | ICD-10-CM | POA: Insufficient documentation

## 2010-10-02 DIAGNOSIS — R5381 Other malaise: Secondary | ICD-10-CM | POA: Insufficient documentation

## 2010-10-02 DIAGNOSIS — R079 Chest pain, unspecified: Secondary | ICD-10-CM | POA: Insufficient documentation

## 2010-10-02 DIAGNOSIS — I10 Essential (primary) hypertension: Secondary | ICD-10-CM | POA: Insufficient documentation

## 2010-10-02 DIAGNOSIS — R0609 Other forms of dyspnea: Secondary | ICD-10-CM | POA: Insufficient documentation

## 2010-10-02 DIAGNOSIS — R0989 Other specified symptoms and signs involving the circulatory and respiratory systems: Secondary | ICD-10-CM | POA: Insufficient documentation

## 2010-10-02 DIAGNOSIS — I252 Old myocardial infarction: Secondary | ICD-10-CM | POA: Insufficient documentation

## 2010-10-02 DIAGNOSIS — R0602 Shortness of breath: Secondary | ICD-10-CM

## 2010-10-04 ENCOUNTER — Ambulatory Visit (HOSPITAL_COMMUNITY)
Admission: RE | Admit: 2010-10-04 | Discharge: 2010-10-04 | Disposition: A | Discharge status: Home / Self Care | Payer: Medicare Other | Source: Ambulatory Visit | Attending: Cardiovascular Disease | Admitting: Cardiovascular Disease

## 2010-10-04 DIAGNOSIS — R0602 Shortness of breath: Secondary | ICD-10-CM | POA: Insufficient documentation

## 2010-10-09 ENCOUNTER — Telehealth: Payer: Self-pay | Admitting: *Deleted

## 2010-10-09 DIAGNOSIS — R002 Palpitations: Secondary | ICD-10-CM

## 2010-10-09 DIAGNOSIS — R0602 Shortness of breath: Secondary | ICD-10-CM

## 2010-10-09 NOTE — Telephone Encounter (Signed)
Pt informed of results and will come in for event monitor

## 2010-10-09 NOTE — Telephone Encounter (Signed)
Message copied by Antony Odea on Mon Oct 09, 2010  1:16 PM ------      Message from: Vesta Mixer      Created: Mon Oct 09, 2010 11:23 AM       In reviewing patients notes, pulmonary funtion test is fairly normal.  Echo is fairly normal but notes that she was in A-Fib during the echo.  She was in sinus rhythm ( documented by ECG) during the last visit.  Perhaps she is having her episodes of dyspnea with a-fib.            Rec: event monitor. 30 day

## 2010-10-12 ENCOUNTER — Encounter (INDEPENDENT_AMBULATORY_CARE_PROVIDER_SITE_OTHER): Payer: Medicare Other | Admitting: *Deleted

## 2010-10-12 DIAGNOSIS — I498 Other specified cardiac arrhythmias: Secondary | ICD-10-CM

## 2010-10-23 ENCOUNTER — Encounter: Payer: Self-pay | Admitting: Cardiovascular Disease

## 2010-10-24 ENCOUNTER — Telehealth: Payer: Self-pay | Admitting: *Deleted

## 2010-10-24 NOTE — Telephone Encounter (Signed)
ecardio called with a 3 sec pause. Will fax here to office.

## 2010-10-24 NOTE — Telephone Encounter (Signed)
Ecardio called office/ Pt contacted/ denies feeling 3 sec pause. States her pulse has been low for awhile and just doesn't feel well in general, she feels it is too low for her in 56-60. Consulted Dawayne Patricia NP and then informed pt to hold atenolol and diltiazem till sees Dr [name],  ecardio results of pause and intermittent afib. Pt fit into tomorrows schedule and informed not to drive for safety of heart pause. Pt verbalized understanding. Alfonso Ramus RN

## 2010-10-25 ENCOUNTER — Encounter: Payer: Self-pay | Admitting: Cardiovascular Disease

## 2010-10-25 ENCOUNTER — Ambulatory Visit (INDEPENDENT_AMBULATORY_CARE_PROVIDER_SITE_OTHER): Payer: Medicare Other | Admitting: Cardiovascular Disease

## 2010-10-25 VITALS — BP 156/90 | HR 74 | Ht 75.0 in | Wt 164.2 lb

## 2010-10-25 DIAGNOSIS — R001 Bradycardia, unspecified: Secondary | ICD-10-CM

## 2010-10-25 DIAGNOSIS — R5383 Other fatigue: Secondary | ICD-10-CM

## 2010-10-25 DIAGNOSIS — I10 Essential (primary) hypertension: Secondary | ICD-10-CM

## 2010-10-25 DIAGNOSIS — I498 Other specified cardiac arrhythmias: Secondary | ICD-10-CM

## 2010-10-25 MED ORDER — AMLODIPINE BESYLATE 5 MG PO TABS: 5.0000 mg | ORAL_TABLET | Freq: Every day | ORAL | Status: DC

## 2010-10-25 NOTE — Assessment & Plan Note (Addendum)
She had a 3 second pause on her event monitor. She's been having lots of fatigue. She has measured her heart rate in the 40-50 range. We'll discontinue the diltiazem.  We'll start her on amlodipine 5 mg a day. I'll see her back in the office in several weeks.

## 2010-10-25 NOTE — Assessment & Plan Note (Signed)
We'll be discontinuing the diltiazem. We'll add amlodipine 5 mg a day

## 2010-10-25 NOTE — Progress Notes (Signed)
[name] Date of Birth  1934/09/08 [facility] / [facility] [address]     [address] [place], [place]  [address] [phone]  Fax  [phone]  History of Present Illness:  76 year old female with a history of hyperlipidemia and a history of a non-ST segment elevation myocardial infarction. She has normal coronary arteries by heart catheterization.  She's been having some problems with generalized fatigue.  She has noted that her heart rate has been a lot slower the summer.   She has  Measured it  in the 50 range and sometimes in the 40s.  She had a [employer] monitor and she was found to have a 3 second pause. Her Cardizem and her atenolol were held yesterday and she returns today for followup visit.  She is actually feeling a little bit better today.  Current Outpatient Prescriptions on File Prior to Visit  Medication Sig Dispense Refill  . alendronate (FOSAMAX) 70 MG tablet Take 70 mg by mouth every 7 (seven) days. Take with a full glass of water on an empty stomach.       [name] aspirin 81 MG tablet Take 81 mg by mouth daily.        [name] atenolol (TENORMIN) 25 MG tablet Take 25 mg by mouth daily.       [name] atorvastatin (LIPITOR) 20 MG tablet Take 20 mg by mouth daily.        . Calcium Carbonate (CALCIUM 600 PO) Take by mouth daily.        . cholecalciferol (VITAMIN D) 1000 UNITS tablet Take 1,000 Units by mouth daily.        [name] diltiazem (CARDIZEM CD) 360 MG 24 hr capsule Take 360 mg by mouth daily.        [name] doxazosin (CARDURA) 4 MG tablet Take 4 mg by mouth at bedtime. Taking 1/2 daily       . Esomeprazole Magnesium (NEXIUM PO) Take by mouth 4 (four) times a week.        . ezetimibe (ZETIA) 10 MG tablet Take 10 mg by mouth daily. Taking 1/2 daily       . Multiple Vitamin (MULTIVITAMIN) tablet Take 1 tablet by mouth daily.        [name] NITROSTAT 0.4 MG SL tablet DISSOLVE 1 TABLET UNDER THE TONGUE AS NEEDED  25 tablet  3  . Nutritional Supplements (CHOICE DM  FIBER-BURST PO) Take by mouth 2 (two) times daily.        . Omega-3 Fatty Acids (FISH OIL BURP-LESS) 1200 MG CAPS Take by mouth daily.        . potassium chloride (K-DUR) 10 MEQ tablet Take 10 mEq by mouth daily.        . valsartan-hydrochlorothiazide (DIOVAN HCT) 160-12.5 MG per tablet Take 1 tablet by mouth 2 (two) times daily.          No Known Allergies  Past Medical History  Diagnosis Date  . MI, acute, non ST segment elevation Dec.7,2011    cath  . Coronary artery disease     minimal  . Hypertension   . Hyperlipidemia   . Hypokalemia   . Incontinence of urine   . Stromal tumor of the stomach     Past Surgical History  Procedure Date  . Cardiac catheterization 12/07/20210    Dr.Nahser,minor cad  . Stromal stomach  tumor 03/26/2007    resection    History  Smoking status  . Never Smoker  Smokeless tobacco  . Not on file    History  Alcohol Use     No family history on file.  Reviw of Systems:  Reviewed in the HPI.  All other systems are negative.  Physical Exam: BP 156/90  Pulse 74  Ht 6\' 3"  (1.905 m)  Wt 164 lb 3.2 oz (74.481 kg)  BMI 20.52 kg/m2  SpO2 97% The patient is alert and oriented x 3.  The mood and affect are normal.   Skin: warm and dry.  Color is normal.    HEENT:   the sclera are nonicteric.  The mucous membranes are moist.  The carotids are 2+ without bruits.  There is no thyromegaly.  There is no JVD.    Lungs: clear.  The chest wall is non tender.    Heart: regular rate with a normal S1 and S2.  There are no murmurs, gallops, or rubs. The PMI is not displaced.     Abdomen: good bowel sounds.  There is no guarding or rebound.  There is no hepatosplenomegaly or tenderness.  There are no masses.   Extremities:  no clubbing, cyanosis, or edema.  The legs are without rashes.  The distal pulses are intact.   Neuro:  Cranial nerves II - XII are intact.  Motor and sensory functions are intact.    The gait is normal.  ECG:  Assessment  / Plan:

## 2010-10-25 NOTE — Assessment & Plan Note (Signed)
Her fatigue is fairly ill-defined but it is certainly possible that it is due to her bradycardia. We have discontinued her Cardizem. We may end up stopping her atenolol as well. I'll see her back in several weeks to see how she's doing with a slightly high heart rate.

## 2010-11-01 ENCOUNTER — Encounter: Payer: Self-pay | Admitting: Cardiovascular Disease

## 2010-11-14 ENCOUNTER — Encounter: Payer: Self-pay | Admitting: Cardiovascular Disease

## 2010-11-16 LAB — CBC
HCT: 33.9 — ABNORMAL LOW
HCT: 35.5 — ABNORMAL LOW
MCHC: 33.8
MCHC: 34.1
MCV: 84.6
MCV: 85.6
Platelets: 201
Platelets: 203
Platelets: 230
RBC: 4.01
RBC: 4.28
RDW: 13.8
WBC: 10.4
WBC: 5.7

## 2010-11-16 LAB — COMPREHENSIVE METABOLIC PANEL
ALT: 11
AST: 19
Albumin: 3.6
Calcium: 9.5
Creatinine, Ser: 0.91
GFR calc Af Amer: >60
Sodium: 136
Total Protein: 6.6

## 2010-11-16 LAB — BASIC METABOLIC PANEL
BUN: 5 — ABNORMAL LOW
BUN: 9
CO2: 25
Chloride: 90 — ABNORMAL LOW
Chloride: 98
Creatinine, Ser: 0.65
Creatinine, Ser: 0.79
GFR calc Af Amer: >60
GFR calc Af Amer: >60
GFR calc non Af Amer: >60
Potassium: 3 — ABNORMAL LOW
Potassium: 3.3 — ABNORMAL LOW

## 2010-11-16 LAB — TYPE AND SCREEN

## 2010-11-16 LAB — DIFFERENTIAL
Eosinophils Absolute: 0.2
Eosinophils Relative: 4
Lymphocytes Relative: 37
Lymphs Abs: 2.1
Monocytes Relative: 10

## 2010-11-16 LAB — ABO/RH: ABO/RH(D): A POS

## 2010-11-23 ENCOUNTER — Ambulatory Visit: Payer: Medicare Other | Admitting: Cardiovascular Disease

## 2010-11-23 LAB — BASIC METABOLIC PANEL
BUN: 15
BUN: 7
CO2: 27
CO2: 30
Calcium: 8.4
GFR calc non Af Amer: >60
GFR calc non Af Amer: >60
Glucose, Bld: 105 — ABNORMAL HIGH
Glucose, Bld: 156 — ABNORMAL HIGH
Potassium: 3.1 — ABNORMAL LOW
Potassium: 3.5

## 2010-11-23 LAB — CBC
HCT: 25.1 — ABNORMAL LOW
HCT: 25.8 — ABNORMAL LOW
HCT: 28.6 — ABNORMAL LOW
Hemoglobin: 8.8 — ABNORMAL LOW
Hemoglobin: 9.6 — ABNORMAL LOW
MCHC: 33.5
MCHC: 33.8
MCHC: 35.1
MCV: 87.4
MCV: 87.5
MCV: 88.1
Platelets: 143 — ABNORMAL LOW
Platelets: 144 — ABNORMAL LOW
Platelets: 220
Platelets: 237
RBC: 3.23 — ABNORMAL LOW
RDW: 13.4
RDW: 13.4
RDW: 13.5
WBC: 8.5

## 2010-11-23 LAB — POCT CARDIAC MARKERS: CKMB, poc: 4.6

## 2010-11-23 LAB — URINALYSIS, MICROSCOPIC ONLY
Hgb urine dipstick: NEGATIVE
Leukocytes, UA: NEGATIVE
Protein, ur: NEGATIVE
Specific Gravity, Urine: 1.011
Urobilinogen, UA: 0.2

## 2010-11-23 LAB — URINALYSIS, ROUTINE W REFLEX MICROSCOPIC
Bilirubin Urine: NEGATIVE
Hgb urine dipstick: NEGATIVE
Ketones, ur: NEGATIVE
Nitrite: NEGATIVE
pH: 6.5

## 2010-11-23 LAB — COMPREHENSIVE METABOLIC PANEL
ALT: 18
Albumin: 2.5 — ABNORMAL LOW
Albumin: 2.7 — ABNORMAL LOW
Alkaline Phosphatase: 36 — ABNORMAL LOW
Alkaline Phosphatase: 37 — ABNORMAL LOW
BUN: 12
Calcium: 8.5
Calcium: 8.8
Creatinine, Ser: 0.57
GFR calc Af Amer: >60
Glucose, Bld: 109 — ABNORMAL HIGH
Potassium: 2.9 — ABNORMAL LOW
Potassium: 4
Sodium: 130 — ABNORMAL LOW
Total Protein: 5.3 — ABNORMAL LOW
Total Protein: 5.4 — ABNORMAL LOW

## 2010-11-23 LAB — DIFFERENTIAL
Basophils Relative: 1
Basophils Relative: 1
Eosinophils Absolute: 0
Lymphocytes Relative: 21
Lymphs Abs: 1.3
Monocytes Absolute: 0.8
Monocytes Absolute: 0.9
Monocytes Relative: 12
Monocytes Relative: 14 — ABNORMAL HIGH
Neutro Abs: 4.1
Neutrophils Relative %: 63

## 2010-11-23 LAB — URINE CULTURE
Colony Count: 2000
Colony Count: NO GROWTH
Culture: NO GROWTH

## 2010-11-23 LAB — PROTIME-INR: Prothrombin Time: 13

## 2010-11-23 LAB — POCT I-STAT, CHEM 8
Glucose, Bld: 112 — ABNORMAL HIGH
HCT: 41
Hemoglobin: 13.9
Potassium: 3.2 — ABNORMAL LOW
Sodium: 141

## 2010-11-24 LAB — BASIC METABOLIC PANEL
BUN: 9
CO2: 29
Chloride: 98
GFR calc non Af Amer: >60
Glucose, Bld: 104 — ABNORMAL HIGH
Potassium: 3.9
Sodium: 133 — ABNORMAL LOW

## 2010-12-15 ENCOUNTER — Ambulatory Visit (INDEPENDENT_AMBULATORY_CARE_PROVIDER_SITE_OTHER): Payer: Medicare Other | Admitting: Cardiovascular Disease

## 2010-12-15 ENCOUNTER — Encounter: Payer: Self-pay | Admitting: Cardiovascular Disease

## 2010-12-15 DIAGNOSIS — I4891 Unspecified atrial fibrillation: Secondary | ICD-10-CM

## 2010-12-15 DIAGNOSIS — R000 Tachycardia, unspecified: Secondary | ICD-10-CM

## 2010-12-15 MED ORDER — DABIGATRAN ETEXILATE MESYLATE 150 MG PO CAPS: 150.0000 mg | ORAL_CAPSULE | Freq: Two times a day (BID) | ORAL | Status: DC

## 2010-12-15 NOTE — Patient Instructions (Signed)
Your physician wants you to follow-up in: 3 months with ekg You will receive a reminder letter in the mail two months in advance. If you don't receive a letter, please call our office to schedule the follow-up appointment.  Your physician has recommended you make the following change in your medication:   1) start Pradaxa 150 mg one tablet twice a day.  Your physician has requested that you have an echocardiogram. Echocardiography is a painless test that uses sound waves to create images of your heart. It provides your doctor with information about the size and shape of your heart and how well your heart's chambers and valves are working. This procedure takes approximately one hour. There are no restrictions for this procedure.

## 2010-12-15 NOTE — Progress Notes (Signed)
[name] Date of Birth  03-03-1934 [name] [address]    [address] [place], [place]  [address] [phone]  Fax  [phone]  History of Present Illness:  [name] is a 76 year old female with a history of hypertension and hypercholesterolemia. She has a history of a non-ST segment elevation myocardial infarction but had minimal coronary artery disease by heart catheterization.  She's done fairly well. Her heart rate has been little bit elevated today but otherwise she feels fine.  Current Outpatient Prescriptions  Medication Sig Dispense Refill  . alendronate (FOSAMAX) 70 MG tablet Take 70 mg by mouth every 7 (seven) days. Take with a full glass of water on an empty stomach.       [name] aspirin 81 MG tablet Take 81 mg by mouth daily.        [name] atenolol (TENORMIN) 25 MG tablet Take 50 mg by mouth daily.       [name] atorvastatin (LIPITOR) 20 MG tablet Take 20 mg by mouth daily.        . cholecalciferol (VITAMIN D) 1000 UNITS tablet Take 1,000 Units by mouth daily.        [name] DILTIAZEM HCL PO Take by mouth daily.        [name] doxazosin (CARDURA) 4 MG tablet Take 4 mg by mouth at bedtime. Taking 1/2 daily       . Esomeprazole Magnesium (NEXIUM PO) Take 40 mg by mouth 4 (four) times a week.       . ezetimibe (ZETIA) 10 MG tablet Take 10 mg by mouth daily. Taking 1/2 daily       . Multiple Vitamin (MULTIVITAMIN) tablet Take 1 tablet by mouth daily.        [name] NITROSTAT 0.4 MG SL tablet DISSOLVE 1 TABLET UNDER THE TONGUE AS NEEDED  25 tablet  3  . Nutritional Supplements (CHOICE DM FIBER-BURST PO) Take by mouth 2 (two) times daily.        . Omega-3 Fatty Acids (FISH OIL BURP-LESS) 1200 MG CAPS Take by mouth daily.        . potassium chloride (K-DUR) 10 MEQ tablet Take 10 mEq by mouth daily.        . valsartan-hydrochlorothiazide (DIOVAN HCT) 160-12.5 MG per tablet Take 1 tablet by mouth 2 (two) times daily.        [name] amLODipine (NORVASC) 5 MG tablet Take 1 tablet (5 mg total) by mouth daily.   30 tablet  11     No Known Allergies  Past Medical History  Diagnosis Date  . MI, acute, non ST segment elevation Dec.7,2011    cath  . Coronary artery disease     minimal  . Hypertension   . Hyperlipidemia   . Hypokalemia   . Incontinence of urine   . Stromal tumor of the stomach     Past Surgical History  Procedure Date  . Cardiac catheterization 12/07/20210    Dr.Kayia Billinger,minor cad  . Stromal stomach  tumor 03/26/2007    resection    History  Smoking status  . Never Smoker   Smokeless tobacco  . Not on file    History  Alcohol Use     No family history on file.  Reviw of Systems:  Reviewed in the HPI.  All other systems are negative.  Physical Exam: BP 130/80  Pulse 101  Ht 5' 4.5" (1.638 m)  Wt 166 lb (75.297 kg)  BMI 28.05 kg/m2 The patient is alert and oriented  x 3.  The mood and affect are normal.   Skin: warm and dry.  Color is normal.    HEENT:   Normal carotids, No JVD  Lungs: clear   Heart: Irregularly Irregularly, slightly tachycardic    Abdomen: normal , Good BS, no HSM  Extremities:  No edema  Neuro:  nonfocal     ECG:  Assessment / Plan:

## 2010-12-15 NOTE — Assessment & Plan Note (Addendum)
[name] presents today with unexplained tachycardia. Her rhythm was irregular on exam and a subsequent EKG reveals atrial fibrillation. She had a brief episode of transient atrial ablation during an event monitor last August. She was asymptomatic at the time. She clearly has had atrial ablation for the past 6 or 7 hours.  We discussed the scoring system for her atrial ablation (CHADS) . She has a history of hypertension and she 76 years old. She does not have any history of congestive heart failure, stroke, or diabetes mellitus.  We'll get an echocardiogram.  We will have her take an extra atenolol tablet for the next several days.  She will call us back in a week or so to let us know how her heart rate is.  We'll start her on Pradaxa 150 mg twice a day. I'll see her again in 3 months for followup visit and an EKG.

## 2010-12-26 ENCOUNTER — Encounter: Payer: Self-pay | Admitting: *Deleted

## 2010-12-27 ENCOUNTER — Ambulatory Visit (HOSPITAL_COMMUNITY): Payer: Medicare Other | Attending: Cardiology | Admitting: Radiology

## 2010-12-27 DIAGNOSIS — E785 Hyperlipidemia, unspecified: Secondary | ICD-10-CM | POA: Insufficient documentation

## 2010-12-27 DIAGNOSIS — I10 Essential (primary) hypertension: Secondary | ICD-10-CM | POA: Insufficient documentation

## 2010-12-27 DIAGNOSIS — R000 Tachycardia, unspecified: Secondary | ICD-10-CM | POA: Insufficient documentation

## 2010-12-27 DIAGNOSIS — I4891 Unspecified atrial fibrillation: Secondary | ICD-10-CM | POA: Insufficient documentation

## 2010-12-28 ENCOUNTER — Telehealth: Payer: Self-pay | Admitting: *Deleted

## 2010-12-28 NOTE — Telephone Encounter (Signed)
Patient called with echo results. Pt verbalized understanding. F/u app set, Alfonso Ramus RN

## 2010-12-28 NOTE — Telephone Encounter (Signed)
Message copied by Antony Odea on Thu Dec 28, 2010  4:27 PM ------      Message from: Vesta Mixer      Created: Thu Dec 28, 2010  9:37 AM       Nl LV systolic function.  Diastolic dysfunction.  No significant abn.

## 2011-01-01 ENCOUNTER — Telehealth: Payer: Self-pay | Admitting: Cardiovascular Disease

## 2011-01-01 NOTE — Telephone Encounter (Signed)
New message-pt was here last week and she was put on pradaxa. She is also taking low dose aspirin. Does she need to continue taking  Please call

## 2011-01-01 NOTE — Telephone Encounter (Signed)
Pt called, she is to take pradaxa and asa 81 mg

## 2011-01-15 ENCOUNTER — Other Ambulatory Visit: Payer: Self-pay | Admitting: Cardiovascular Disease

## 2011-04-03 ENCOUNTER — Ambulatory Visit (INDEPENDENT_AMBULATORY_CARE_PROVIDER_SITE_OTHER): Payer: Medicare Other | Admitting: Cardiovascular Disease

## 2011-04-03 ENCOUNTER — Encounter: Payer: Self-pay | Admitting: Cardiovascular Disease

## 2011-04-03 VITALS — BP 148/72 | HR 59 | Ht 64.5 in | Wt 166.8 lb

## 2011-04-03 DIAGNOSIS — I10 Essential (primary) hypertension: Secondary | ICD-10-CM

## 2011-04-03 DIAGNOSIS — E785 Hyperlipidemia, unspecified: Secondary | ICD-10-CM

## 2011-04-03 NOTE — Patient Instructions (Signed)
Your physician wants you to follow-up in: 6 MONTHS You will receive a reminder letter in the mail two months in advance. If you don't receive a letter, please call our office to schedule the follow-up appointment.  Your physician recommends that you return for a FASTING lipid profile: 6 MONTHS    

## 2011-04-03 NOTE — Assessment & Plan Note (Addendum)
[name]'s blood pressure is mildly elevated. I do not know the dose of her diltiazem. She is also on amlodipine. We will have her call us back later today or tomorrow to give [place] the exact dose of her diltiazem. I've encouraged her to stick to a good diet and exercise program and to watch her salt. I think that this will help control her blood pressure.

## 2011-04-03 NOTE — Progress Notes (Addendum)
[name] Date of Birth  [birth year] [facility]     [facility]  [address]    [address]   [address] [place], [place]  [address]    [place], [place]  [address] [phone]  Fax  [phone]  [phone]  Fax [phone]  Problem List: 1. Small NSTEMI by cardiac enzymes but minimal CAD by cath 2. LVH 3.HTN 4. Hyperlipidemia 5.  Hypokalemia 6. Atrial Fibrillation  History of Present Illness:  [name] is doing fairly well. She is walking and exercising some. She notes that her heart rate is still quite variable. She has a hx of  PAF but typically does not notice if her HR is irregular.  Current Outpatient Prescriptions on File Prior to Visit  Medication Sig Dispense Refill  . alendronate (FOSAMAX) 70 MG tablet Take 70 mg by mouth every 7 (seven) days. Take with a full glass of water on an empty stomach.       [name] amLODipine (NORVASC) 5 MG tablet Take 1 tablet (5 mg total) by mouth daily.  30 tablet  11  . aspirin 81 MG tablet Take 81 mg by mouth daily.        [name] atenolol (TENORMIN) 25 MG tablet Take 50 mg by mouth daily.       [name] atorvastatin (LIPITOR) 20 MG tablet Take 20 mg by mouth daily.        . cholecalciferol (VITAMIN D) 1000 UNITS tablet Take 1,000 Units by mouth daily.        . dabigatran (PRADAXA) 150 MG CAPS Take 1 capsule (150 mg total) by mouth every 12 (twelve) hours.  60 capsule  5  . DILTIAZEM HCL PO Take by mouth daily.        [name] doxazosin (CARDURA) 4 MG tablet TAKE 1 TABLET AT BEDTIME  30 tablet  5  . Esomeprazole Magnesium (NEXIUM PO) Take 40 mg by mouth 4 (four) times a week.       . ezetimibe (ZETIA) 10 MG tablet Take 10 mg by mouth daily. Taking 1/2 daily       . Multiple Vitamin (MULTIVITAMIN) tablet Take 1 tablet by mouth daily.        [name] NITROSTAT 0.4 MG SL tablet DISSOLVE 1 TABLET UNDER THE TONGUE AS NEEDED  25 tablet  3  . Nutritional Supplements (CHOICE DM FIBER-BURST PO) Take by mouth 2 (two) times daily.        . Omega-3 Fatty  Acids (FISH OIL BURP-LESS) 1200 MG CAPS Take by mouth daily.        . potassium chloride (K-DUR) 10 MEQ tablet Take 10 mEq by mouth daily.        . valsartan-hydrochlorothiazide (DIOVAN HCT) 160-12.5 MG per tablet Take 1 tablet by mouth 2 (two) times daily.          No Known Allergies  Past Medical History  Diagnosis Date  . MI, acute, non ST segment elevation Dec.7,2011    cath  . Coronary artery disease     minimal  . Hypertension   . Hyperlipidemia   . Hypokalemia   . Incontinence of urine   . Stromal tumor of the stomach     Past Surgical History  Procedure Date  . Cardiac catheterization 12/07/20210    Dr.Kewon Statler,minor cad  . Stromal stomach  tumor 03/26/2007    resection    History  Smoking status  . Never Smoker   Smokeless tobacco  . Not on  file    History  Alcohol Use     No family history on file.  Reviw of Systems:  Reviewed in the HPI.  All other systems are negative.  Physical Exam: Blood pressure 148/72, pulse 59, height 5' 4.5" (1.638 m), weight 166 lb 12.8 oz (75.66 kg). General: Well developed, well nourished, in no acute distress.  Head: Normocephalic, atraumatic, sclera non-icteric, mucus membranes are moist,   Neck: Supple. Negative for carotid bruits. JVD not elevated.  Lungs: Clear bilaterally to auscultation without wheezes, rales, or rhonchi. Breathing is unlabored.  Heart: RRR with S1 S2. No murmurs, rubs, or gallops appreciated.  Abdomen: Soft, non-tender, non-distended with normoactive bowel sounds. No hepatomegaly. No rebound/guarding. No obvious abdominal masses.  Msk:  Strength and tone appear normal for age.  Extremities: No clubbing or cyanosis. No edema.  Distal pedal pulses are 2+ and equal bilaterally.  Neuro: Alert and oriented X 3. Moves all extremities spontaneously.  Psych:  Responds to questions appropriately with a normal affect.  ECG:  Assessment / Plan:

## 2011-06-14 ENCOUNTER — Encounter: Payer: Self-pay | Admitting: Internal Medicine

## 2011-07-13 ENCOUNTER — Encounter: Payer: Self-pay | Admitting: Internal Medicine

## 2011-07-13 ENCOUNTER — Ambulatory Visit (INDEPENDENT_AMBULATORY_CARE_PROVIDER_SITE_OTHER): Payer: Medicare Other | Admitting: Internal Medicine

## 2011-07-13 VITALS — BP 126/62 | HR 100 | Ht 64.25 in | Wt 160.2 lb

## 2011-07-13 DIAGNOSIS — R195 Other fecal abnormalities: Secondary | ICD-10-CM

## 2011-07-13 DIAGNOSIS — R1310 Dysphagia, unspecified: Secondary | ICD-10-CM

## 2011-07-13 DIAGNOSIS — I482 Chronic atrial fibrillation, unspecified: Secondary | ICD-10-CM

## 2011-07-13 DIAGNOSIS — D689 Coagulation defect, unspecified: Secondary | ICD-10-CM

## 2011-07-13 DIAGNOSIS — K219 Gastro-esophageal reflux disease without esophagitis: Secondary | ICD-10-CM

## 2011-07-13 DIAGNOSIS — I4891 Unspecified atrial fibrillation: Secondary | ICD-10-CM

## 2011-07-13 MED ORDER — MOVIPREP 100 G PO SOLR: 1.0000 | Freq: Once | ORAL | Status: DC

## 2011-07-13 NOTE — Patient Instructions (Signed)
You have been scheduled for an endoscopy and colonoscopy with propofol. Please follow the written instructions given to you at your visit today. Please pick up your prep at the pharmacy within the next 1-3 days.  You may continue to take your aspirin before the procedure

## 2011-07-13 NOTE — Progress Notes (Signed)
HISTORY OF PRESENT ILLNESS:  [name] is a 77 y.o. female with multiple medical problems including hypertension, hyperlipidemia, coronary artery disease, GI stromal tumor of the stomach for which she underwent resection, and atrial fibrillation, diagnosed earlier this year, for which she is on Pradaxa. She is sent today by Dr. Waynard Edwards regarding Hemoccult-positive stool on outpatient home testing. This is a new problem. Patient denies abdominal pain, change in bowel habits, weight loss, melena, or hematochezia. She does have GERD for which she takes Nexium every other day. This seems to help. Occasional breakthrough. She also reports a new problem of dysphagia. She describes this vaguely. This has been going on for several years. More noticeable recently. She describes this as "strangling. Not clearly food sticking. Occasional coughing. Her last upper endoscopy was performed in 2009. This was for surveillance after removal of GI stromal tumor. The exam was unremarkable. Her last colonoscopy was in 2003. This was normal. No family history of colon cancer. Outside records have been requested for review (pending as the referring physicians faxed machine is apparently temporarily disabled). However, was able to pull records from July of 2012. Specifically, laboratories. Comprehensive metabolic panel was normal. CBC was normal with a hemoglobin of 12.5. In addition to Pradaxa. She is on aspirin.  REVIEW OF SYSTEMS:  All non-GI ROS negative except for Back pain, hearing problems, fatigue, irregular heart rate, shortness of breath, urinary frequency, urinary leakage  Past Medical History  Diagnosis Date  . MI, acute, non ST segment elevation Dec.7,2011    cath  . Coronary artery disease     minimal  . Hypertension   . Hyperlipidemia   . Hypokalemia   . Incontinence of urine   . Stromal tumor of the stomach     cancer  . Atrial fibrillation     Past Surgical History  Procedure Date  . Cardiac  catheterization 12/07/20210    Dr.Nahser,minor cad  . Stromal stomach  tumor 03/26/2007    resection  . Ankle fracture surgery     right, plate     Social History [name]  reports that she has never smoked. She has never used smokeless tobacco. She reports that she drinks alcohol. She reports that she does not use illicit drugs.  family history includes Diabetes in her paternal aunt; Heart disease in her paternal grandfather; and Pancreatic cancer in her paternal grandmother.  No Known Allergies     PHYSICAL EXAMINATION: Vital signs: BP 126/62  Pulse 100  Ht 5' 4.25" (1.632 m)  Wt 160 lb 4 oz (72.689 kg)  BMI 27.29 kg/m2  Constitutional: generally well-appearing, no acute distress Psychiatric: alert and oriented x3, cooperative Eyes: extraocular movements intact, anicteric, conjunctiva pink Mouth: oral pharynx moist, no lesions Neck: supple no lymphadenopathy Cardiovascular: irregular rate and rhythm, no murmur. Heart rate 80 by my assessment Lungs: clear to auscultation bilaterally Abdomen: soft, nontender, nondistended, no obvious ascites, no peritoneal signs, normal bowel sounds, no organomegaly Rectal:deferred until colonoscopy Extremities: no lower extremity edema bilaterally Skin: no lesions on visible extremities Neuro: No focal deficits. Normal reflexes     ASSESSMENT:  #1. Hemoccult-positive stool (new problem). Etiology unclear. Further workup warranted #2. GERD. Chronic. Stable on PPI. #3. Vague dysphagia. (New problem). Etiology unclear. Does not sound obstructive. Maybe spasm. Further workup warranted #4. Multiple general medical problems including atrial fibrillation for which she is on Pradaxa and aspirin  PLAN:  #1. Colonoscopy to evaluate Hemoccult-positive stool. The patient is high-risk given the need  to address Pradaxa and her chronic atrial fibrillation.The nature of the procedure, as well as the risks, benefits, and alternatives were carefully  and thoroughly reviewed with the patient. Ample time for discussion and questions allowed. The patient understood, was satisfied, and agreed to proceed. #2. Movi prep prescribed. The patient instructed on its use #3. Upper endoscopy with possible esophageal dilation.The nature of the procedure, as well as the risks, benefits, and alternatives were carefully and thoroughly reviewed with the patient. Ample time for discussion and questions allowed. The patient understood, was satisfied, and agreed to proceed.  #4. Continue PPI to control GERD symptoms. #5. Long discussion today regarding the pros and cons of interrupted Pradaxa therapy for her procedures. As they are potentially therapeutic, this would be best. I would recommend coming off her Pradaxa for 3 days prior to the procedures, while remaining on aspirin. However, the ultimate recommendation will be approved by her cardiologist, Dr. [name]. We will correspond with him directly.

## 2011-07-16 ENCOUNTER — Telehealth: Payer: Self-pay

## 2011-07-16 NOTE — Telephone Encounter (Signed)
  07/16/2011    RE: [name] DOB: Oct 07, 1934 MRN: [id number]   Dear [name],    We have scheduled the above patient for an endoscopic procedure. Our records show that she is on anticoagulation therapy.   Please advise as to how long the patient may come off her therapy of Pradaxa prior to the procedure, which is scheduled for 08-23-11.  Please fax back/ or route the completed form to [place] at [phone].   Sincerely,  Gracy Racer

## 2011-07-17 ENCOUNTER — Encounter: Payer: Self-pay | Admitting: Gastroenterology

## 2011-07-17 ENCOUNTER — Encounter: Payer: Self-pay | Admitting: *Deleted

## 2011-07-17 ENCOUNTER — Encounter: Payer: Self-pay | Admitting: Internal Medicine

## 2011-08-22 ENCOUNTER — Telehealth: Payer: Self-pay | Admitting: Oncology

## 2011-08-22 NOTE — Telephone Encounter (Signed)
lmonvm for pt re appt for 8/6. Schedule mailed.

## 2011-08-23 ENCOUNTER — Encounter: Payer: Medicare Other | Admitting: Internal Medicine

## 2011-08-27 ENCOUNTER — Other Ambulatory Visit: Payer: Self-pay | Admitting: *Deleted

## 2011-08-27 MED ORDER — DABIGATRAN ETEXILATE MESYLATE 150 MG PO CAPS: 150.0000 mg | ORAL_CAPSULE | Freq: Two times a day (BID) | ORAL | Status: DC

## 2011-08-27 NOTE — Telephone Encounter (Signed)
Fax Received. Refill Completed. [name] (R.M.A)   

## 2011-09-04 ENCOUNTER — Ambulatory Visit (AMBULATORY_SURGERY_CENTER): Payer: Medicare Other | Admitting: Internal Medicine

## 2011-09-04 ENCOUNTER — Encounter: Payer: Self-pay | Admitting: Internal Medicine

## 2011-09-04 VITALS — BP 138/75 | HR 92 | Temp 98.4°F | Resp 20 | Ht 64.0 in | Wt 160.0 lb

## 2011-09-04 DIAGNOSIS — Z1211 Encounter for screening for malignant neoplasm of colon: Secondary | ICD-10-CM

## 2011-09-04 DIAGNOSIS — K219 Gastro-esophageal reflux disease without esophagitis: Secondary | ICD-10-CM

## 2011-09-04 DIAGNOSIS — R195 Other fecal abnormalities: Secondary | ICD-10-CM

## 2011-09-04 DIAGNOSIS — R1310 Dysphagia, unspecified: Secondary | ICD-10-CM

## 2011-09-04 MED ORDER — SODIUM CHLORIDE 0.9 % IV SOLN: 500.0000 mL | INTRAVENOUS | Status: DC

## 2011-09-04 NOTE — Op Note (Signed)
[facility] [address] [place], [place]  [address]  COLONOSCOPY PROCEDURE REPORT  PATIENT:  [name], [name]  MR#:  [id number] BIRTHDATE:  [birth year], 76 yrs. old  GENDER:  female ENDOSCOPIST:  [name]. Eda Keys, MD REF. BY:  Rodrigo Ran, M.D. PROCEDURE DATE:  09/04/2011 PROCEDURE:  Average-risk screening colonoscopy G0121 ASA CLASS:  Class III INDICATIONS:  Screening, heme positive stool ; last exam 2003 - PATIENT DID NOT STOP PRADAXA MEDICATIONS:   MAC sedation, administered by CRNA, propofol (Diprivan) 100 mg IV  DESCRIPTION OF PROCEDURE:   After the risks benefits and alternatives of the procedure were thoroughly explained, informed consent was obtained.  Digital rectal exam was performed and revealed no abnormalities.   The LB [id number] endoscope was introduced through the anus and advanced to the cecum, which was identified by both the appendix and ileocecal valve, without limitations.  The quality of the prep was excellent, using MoviPrep.  The instrument was then slowly withdrawn as the colon was fully examined. <<PROCEDUREIMAGES>>  FINDINGS:  A normal appearing cecum, ileocecal valve, and appendiceal orifice were identified. The ascending, hepatic flexure, transverse, splenic flexure, descending, sigmoid colon, and rectum appeared unremarkable.  No polyps or cancers were seen. Retroflexed views in the rectum revealed no abnormalities.    The time to cecum =   4:33  minutes. The scope was then withdrawn in 8:10  minutes from the cecum and the procedure completed.  COMPLICATIONS:  None  ENDOSCOPIC IMPRESSION: 1) Normal colon 2) No polyps or cancers  RECOMMENDATIONS: 1) Upper endoscopy TODAY  ______________________________ [name]. Eda Keys, MD  CC:  Rodrigo Ran, MD;  The Patient  n. eSIGNED:   [name]. Eda Keys at 09/04/2011 02:55 PM  Alen Bleacher, [phone]

## 2011-09-04 NOTE — Progress Notes (Signed)
Patient did not experience any of the following events: a burn prior to discharge; a fall within the facility; wrong site/side/patient/procedure/implant event; or a hospital transfer or hospital admission upon discharge from the facility. (G8907) Patient did not have preoperative order for IV antibiotic SSI prophylaxis. (G8918)  

## 2011-09-04 NOTE — Patient Instructions (Addendum)
Discharge instructions given with verbal understanding. Handout on a hiatal hernia. Resume previous medications. YOU HAD AN ENDOSCOPIC PROCEDURE TODAY AT THE [facility]: Refer to the procedure report that was given to you for any specific questions about what was found during the examination.  If the procedure report does not answer your questions, please call your gastroenterologist to clarify.  If you requested that your care partner not be given the details of your procedure findings, then the procedure report has been included in a sealed envelope for you to review at your convenience later.  YOU SHOULD EXPECT: Some feelings of bloating in the abdomen. Passage of more gas than usual.  Walking can help get rid of the air that was put into your GI tract during the procedure and reduce the bloating. If you had a lower endoscopy (such as a colonoscopy or flexible sigmoidoscopy) you may notice spotting of blood in your stool or on the toilet paper. If you underwent a bowel prep for your procedure, then you may not have a normal bowel movement for a few days.  DIET: Your first meal following the procedure should be a light meal and then it is ok to progress to your normal diet.  A half-sandwich or bowl of soup is an example of a good first meal.  Heavy or fried foods are harder to digest and may make you feel nauseous or bloated.  Likewise meals heavy in dairy and vegetables can cause extra gas to form and this can also increase the bloating.  Drink plenty of fluids but you should avoid alcoholic beverages for 24 hours.  ACTIVITY: Your care partner should take you home directly after the procedure.  You should plan to take it easy, moving slowly for the rest of the day.  You can resume normal activity the day after the procedure however you should NOT DRIVE or use heavy machinery for 24 hours (because of the sedation medicines used during the test).    SYMPTOMS TO REPORT IMMEDIATELY: A  gastroenterologist can be reached at any hour.  During normal business hours, 8:30 AM to 5:00 PM Monday through Friday, call [phone].  After hours and on weekends, please call the GI answering service at [phone] who will take a message and have the physician on call contact you.   Following lower endoscopy (colonoscopy or flexible sigmoidoscopy):  Excessive amounts of blood in the stool  Significant tenderness or worsening of abdominal pains  Swelling of the abdomen that is new, acute  Fever of 100F or higher  Following upper endoscopy (EGD)  Vomiting of blood or coffee ground material  New chest pain or pain under the shoulder blades  Painful or persistently difficult swallowing  New shortness of breath  Fever of 100F or higher  Black, tarry-looking stools  FOLLOW UP: If any biopsies were taken you will be contacted by phone or by letter within the next 1-3 weeks.  Call your gastroenterologist if you have not heard about the biopsies in 3 weeks.  Our staff will call the home number listed on your records the next business day following your procedure to check on you and address any questions or concerns that you may have at that time regarding the information given to you following your procedure. This is a courtesy call and so if there is no answer at the home number and we have not heard from you through the emergency physician on call, we will assume that you have  have returned to your regular daily activities without incident.  SIGNATURES/CONFIDENTIALITY: You and/or your care partner have signed paperwork which will be entered into your electronic medical record.  These signatures attest to the fact that that the information above on your After Visit Summary has been reviewed and is understood.  Full responsibility of the confidentiality of this discharge information lies with you and/or your care-partner.  

## 2011-09-04 NOTE — Op Note (Signed)
[facility] [address] [place], [place]  [address]  ENDOSCOPY PROCEDURE REPORT  PATIENT:  [name], [name]  MR#:  [id number] BIRTHDATE:  [birth year], 76 yrs. old  GENDER:  female  ENDOSCOPIST:  [name]. Eda Keys, MD Referred by:  Rodrigo Ran, M.D.  PROCEDURE DATE:  09/04/2011 PROCEDURE:  EGD, diagnostic 43235 ASA CLASS:  Class III INDICATIONS:  Vague dysphagia, hemoccult positive stool . History of Gastric GIST resected  MEDICATIONS:   MAC sedation, administered by CRNA, propofol (Diprivan) 100 mg IV TOPICAL ANESTHETIC:  none  DESCRIPTION OF PROCEDURE:   After the risks benefits and alternatives of the procedure were thoroughly explained, informed consent was obtained.  The LB-GIF Q180 [id number] endoscope was introduced through the mouth and advanced to the second portion of the duodenum, without limitations.  The instrument was slowly withdrawn as the mucosa was fully examined. <<PROCEDUREIMAGES>>  The upper, middle, and distal third of the esophagus were carefully inspected and no abnormalities were noted. The z-line was well seen at the GEJ. The endoscope was pushed into the fundus which was normal including a retroflexed view. Evidence of prior resection of portion of proximal stomach. The antrum,gastric body, first and second part of the duodenum were unremarkable. Retroflexed views revealed a hiatal hernia.    The scope was then withdrawn from the patient and the procedure completed.  COMPLICATIONS:  None  ENDOSCOPIC IMPRESSION: 1) Normal EGD (save prior post-op changes) 2) A small hiatal hernia  RECOMMENDATIONS: 1) GI follow-up as needed  ______________________________ [name]. Eda Keys, MD  CC:  Rodrigo Ran, MD;  The Patient  n. eSIGNED:   [name]. Eda Keys at 09/04/2011 03:03 PM  Alen Bleacher, [id number]

## 2011-09-05 ENCOUNTER — Telehealth: Payer: Self-pay | Admitting: *Deleted

## 2011-09-05 NOTE — Telephone Encounter (Signed)
  Follow up Call-  Call back number 09/04/2011  Post procedure Call Back phone  # [phone]  Permission to leave phone message Yes     Patient questions:  Do you have a fever, pain , or abdominal swelling? no Pain Score  0 *  Have you tolerated food without any problems? yes  Have you been able to return to your normal activities? yes  Do you have any questions about your discharge instructions: Diet   no Medications  no Follow up visit  no  Do you have questions or concerns about your Care? no  Actions: * If pain score is 4 or above: No action needed, pain <4.

## 2011-10-02 ENCOUNTER — Other Ambulatory Visit: Payer: Medicare Other | Admitting: Lab

## 2011-10-02 ENCOUNTER — Ambulatory Visit: Payer: Medicare Other | Admitting: Oncology

## 2011-10-04 ENCOUNTER — Telehealth: Payer: Self-pay | Admitting: Oncology

## 2011-10-04 NOTE — Telephone Encounter (Signed)
pt had l/,m to r/s missed appt,l/m with new appt 9/26     aom

## 2011-10-30 ENCOUNTER — Encounter: Payer: Self-pay | Admitting: Cardiovascular Disease

## 2011-10-30 ENCOUNTER — Other Ambulatory Visit (INDEPENDENT_AMBULATORY_CARE_PROVIDER_SITE_OTHER): Payer: Medicare Other

## 2011-10-30 ENCOUNTER — Ambulatory Visit (INDEPENDENT_AMBULATORY_CARE_PROVIDER_SITE_OTHER): Payer: Medicare Other | Admitting: Cardiovascular Disease

## 2011-10-30 VITALS — BP 148/88 | HR 65 | Ht 64.0 in | Wt 157.1 lb

## 2011-10-30 DIAGNOSIS — I10 Essential (primary) hypertension: Secondary | ICD-10-CM

## 2011-10-30 DIAGNOSIS — E785 Hyperlipidemia, unspecified: Secondary | ICD-10-CM

## 2011-10-30 DIAGNOSIS — I4891 Unspecified atrial fibrillation: Secondary | ICD-10-CM

## 2011-10-30 LAB — LIPID PANEL
LDL Cholesterol: 59 mg/dL (ref 0–99)
Total CHOL/HDL Ratio: 2

## 2011-10-30 LAB — HEPATIC FUNCTION PANEL
AST: 19 U/L (ref 0–37)
Albumin: 3.8 g/dL (ref 3.5–5.2)
Alkaline Phosphatase: 33 U/L — ABNORMAL LOW (ref 39–117)
Bilirubin, Direct: 0.2 mg/dL (ref 0.0–0.3)
Total Bilirubin: 0.8 mg/dL (ref 0.3–1.2)

## 2011-10-30 LAB — BASIC METABOLIC PANEL
BUN: 19 mg/dL (ref 6–23)
CO2: 27 mEq/L (ref 19–32)
Calcium: 9.5 mg/dL (ref 8.4–10.5)
Chloride: 99 mEq/L (ref 96–112)
Creatinine, Ser: 1 mg/dL (ref 0.4–1.2)
Glucose, Bld: 89 mg/dL (ref 70–99)

## 2011-10-30 MED ORDER — RIVAROXABAN 20 MG PO TABS: 20.0000 mg | ORAL_TABLET | Freq: Every day | ORAL | Status: DC

## 2011-10-30 NOTE — Patient Instructions (Addendum)
Your physician recommends that you schedule a follow-up appointment in: 3 MONTHS   Your physician has recommended you make the following change in your medication:   STOP ATENOLOL DUE TO FATIGUE STOP PRADAXA DUE TO ESOPHAGEAL REFLUX START XARELTO 89M MG DAILY   DILTIAZEM WAS TAKEN OFF YOUR LIST, PHARMACY CONFIRMED YOU HAVE NOT BEEN ON IT FOR OVER 6 MONTHS

## 2011-10-30 NOTE — Progress Notes (Signed)
[name] Date of Birth  1934/09/08 [facility]     [place] Office  [address]    [address]   [address] [place], [place]  [address]    [place], [place]  [address] [phone]  Fax  [phone]  [phone]  Fax [phone]  Problem List: 1. Small NSTEMI by cardiac enzymes but minimal CAD by cath 2. LVH 3.HTN 4. Hyperlipidemia 5.  Hypokalemia 6. Atrial Fibrillation  History of Present Illness:  [name] is doing fairly well. She is walking and exercising some. She notes that her heart rate is still quite variable. She has a hx of  PAF but typically does not notice if her HR is irregular.  She continues to have fatigue.  We tried cutting her atenolol to 25 mg daily which may have helped.    Current Outpatient Prescriptions on File Prior to Visit  Medication Sig Dispense Refill  . alendronate (FOSAMAX) 70 MG tablet Take 70 mg by mouth every 7 (seven) days. Take with a full glass of water on an empty stomach.       [name] aspirin 81 MG tablet Take 81 mg by mouth daily.        [name] atenolol (TENORMIN) 25 MG tablet Take 25 mg by mouth daily.       [name] atorvastatin (LIPITOR) 20 MG tablet Take 20 mg by mouth daily.        . cholecalciferol (VITAMIN D) 1000 UNITS tablet Take 1,000 Units by mouth daily.        . dabigatran (PRADAXA) 150 MG CAPS Take 1 capsule (150 mg total) by mouth every 12 (twelve) hours.  60 capsule  5  . DILTIAZEM HCL PO Take by mouth daily.        [name] doxazosin (CARDURA) 4 MG tablet TAKE 1 TABLET AT BEDTIME  30 tablet  5  . Esomeprazole Magnesium (NEXIUM PO) Take 40 mg by mouth 4 (four) times a week.       . ezetimibe (ZETIA) 10 MG tablet Take 10 mg by mouth daily. Taking 1/2 daily       . KLOR-CON M10 10 MEQ tablet Take 10 mEq by mouth.       . Multiple Vitamin (MULTIVITAMIN) tablet Take 1 tablet by mouth daily.        [name] NITROSTAT 0.4 MG SL tablet DISSOLVE 1 TABLET UNDER THE TONGUE AS NEEDED  25 tablet  3  . Nutritional Supplements (CHOICE DM  FIBER-BURST PO) Take by mouth 2 (two) times daily.        . Omega-3 Fatty Acids (FISH OIL BURP-LESS) 1200 MG CAPS Take by mouth daily.       . potassium chloride (K-DUR) 10 MEQ tablet Take 10 mEq by mouth daily.        . valsartan-hydrochlorothiazide (DIOVAN HCT) 160-12.5 MG per tablet Take 1 tablet by mouth 2 (two) times daily.          No Known Allergies  Past Medical History  Diagnosis Date  . MI, acute, non ST segment elevation Dec.7,2011    cath  . Coronary artery disease     minimal  . Hypertension   . Hyperlipidemia   . Hypokalemia   . Incontinence of urine   . Stromal tumor of the stomach     cancer  . Atrial fibrillation     Past Surgical History  Procedure Date  . Cardiac catheterization 12/07/20210    Dr.Derrick Orris,minor cad  . Stromal stomach  tumor 03/26/2007    resection  . Ankle fracture surgery     right, plate     History  Smoking status  . Never Smoker   Smokeless tobacco  . Never Used    History  Alcohol Use  . 1.2 oz/week  . 2 Glasses of wine per week    one per week    Family History  Problem Relation Age of Onset  . Pancreatic cancer Paternal Grandmother   . Diabetes Paternal Aunt   . Heart disease Paternal Grandfather     both sides of the family    Reviw of Systems:  Reviewed in the HPI.  All other systems are negative.  Physical Exam: Blood pressure 148/88, pulse 65, height 5\' 4"  (1.626 m), weight 157 lb 1.9 oz (71.269 kg). General: Well developed, well nourished, in no acute distress.  Head: Normocephalic, atraumatic, sclera non-icteric, mucus membranes are moist,   Neck: Supple. Negative for carotid bruits. JVD not elevated.  Lungs: Clear bilaterally to auscultation without wheezes, rales, or rhonchi. Breathing is unlabored.  Heart: RRR with S1 S2. No murmurs, rubs, or gallops appreciated.  Abdomen: Soft, non-tender, non-distended with normoactive bowel sounds. No hepatomegaly. No rebound/guarding. No obvious abdominal  masses.  Msk:  Strength and tone appear normal for age.  Extremities: No clubbing or cyanosis. No edema.  Distal pedal pulses are 2+ and equal bilaterally.  Neuro: Alert and oriented X 3. Moves all extremities spontaneously.  Psych:  Responds to questions appropriately with a normal affect.  ECG:  Assessment / Plan:

## 2011-10-30 NOTE — Assessment & Plan Note (Addendum)
[name] seems to be doing well. She's not had any significant palpitations. She does have some fatigue. We'll try holding the atenolol to see if that helps. We'll continue with the diltiazem. I'll see her in 3 months for followup visit

## 2011-10-31 ENCOUNTER — Other Ambulatory Visit: Payer: Self-pay | Admitting: Medical Oncology

## 2011-10-31 ENCOUNTER — Other Ambulatory Visit: Payer: Medicare Other

## 2011-11-01 ENCOUNTER — Telehealth: Payer: Self-pay | Admitting: *Deleted

## 2011-11-01 NOTE — Telephone Encounter (Signed)
Patient confirmed over the phone the same date just different time

## 2011-11-08 ENCOUNTER — Other Ambulatory Visit: Payer: Self-pay | Admitting: Cardiovascular Disease

## 2011-11-08 NOTE — Telephone Encounter (Signed)
Fax Received. Refill Completed. [name] (R.M.A)   

## 2011-11-22 ENCOUNTER — Encounter: Payer: Self-pay | Admitting: Family

## 2011-11-22 ENCOUNTER — Telehealth: Payer: Self-pay | Admitting: Medical Oncology

## 2011-11-22 ENCOUNTER — Telehealth: Payer: Self-pay | Admitting: Oncology

## 2011-11-22 ENCOUNTER — Ambulatory Visit (HOSPITAL_BASED_OUTPATIENT_CLINIC_OR_DEPARTMENT_OTHER): Payer: Medicare Other | Admitting: Family

## 2011-11-22 ENCOUNTER — Other Ambulatory Visit (HOSPITAL_BASED_OUTPATIENT_CLINIC_OR_DEPARTMENT_OTHER): Payer: Medicare Other | Admitting: Lab

## 2011-11-22 ENCOUNTER — Ambulatory Visit: Payer: Medicare Other | Admitting: Oncology

## 2011-11-22 VITALS — BP 137/81 | HR 96 | Temp 97.0°F | Resp 18 | Ht 64.0 in | Wt 159.2 lb

## 2011-11-22 DIAGNOSIS — C49A2 Gastrointestinal stromal tumor of stomach: Secondary | ICD-10-CM

## 2011-11-22 DIAGNOSIS — C166 Malignant neoplasm of greater curvature of stomach, unspecified: Secondary | ICD-10-CM

## 2011-11-22 LAB — CBC WITH DIFFERENTIAL/PLATELET
BASO%: 1.1 % (ref 0.0–2.0)
Basophils Absolute: 0.1 10*3/uL (ref 0.0–0.1)
EOS%: 1.1 % (ref 0.0–7.0)
HCT: 39.4 % (ref 34.8–46.6)
HGB: 13.3 g/dL (ref 11.6–15.9)
LYMPH%: 23.2 % (ref 14.0–49.7)
MCH: 29.7 pg (ref 25.1–34.0)
MCHC: 33.8 g/dL (ref 31.5–36.0)
NEUT%: 63.8 % (ref 38.4–76.8)
Platelets: 215 10*3/uL (ref 145–400)
lymph#: 1.3 10*3/uL (ref 0.9–3.3)

## 2011-11-22 LAB — COMPREHENSIVE METABOLIC PANEL (CC13)
ALT: 10 U/L (ref 0–55)
AST: 19 U/L (ref 5–34)
Alkaline Phosphatase: 47 U/L (ref 40–150)
BUN: 20 mg/dL (ref 7.0–26.0)
Chloride: 100 mEq/L (ref 98–107)
Creatinine: 1 mg/dL (ref 0.6–1.1)
Total Bilirubin: 0.9 mg/dL (ref 0.20–1.20)

## 2011-11-22 NOTE — Patient Instructions (Addendum)
Talk with Dr. Waynard Edwards about shortness of breath with mild exertion, possible sleep apnea, chronic fatigue and nocturia.

## 2011-11-22 NOTE — Progress Notes (Signed)
Not a billable encounter.  A repeat LDH was order due to today's LDH being 252.

## 2011-11-22 NOTE — Telephone Encounter (Signed)
I called pt to inform her that her potassium is 3.4. She states that she is only take 5 meq of K-dur daily. She will increase to 10 meq for a couple of days.

## 2011-11-22 NOTE — Telephone Encounter (Signed)
PRN per 9/26 pof

## 2011-11-22 NOTE — Progress Notes (Signed)
Patient ID: [name], female   DOB: 20-Sep-1934, 77 y.o.   MRN: [id number] CSN: [id number]  Cc:  Sabrina Pastures, MD [name]. Sabrina Skains, MD [name]. Sabrina Edwards, MD [name]. Sabrina Goodell, MD Sabrina Fee, MD  Problem List: [name] is a 77 y.o. Caucasian female with a problem list consisting of:  1.  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  The tumor measured 3 cm.  there was no vascular or perineural invasion. Margins were negative. There was 7 mitotic figures per 50 high-powered fields. There was no necrosis. There was no involvement of the overlying mucosa. It was felt that the patient did not require any adjuvant therapy. 2.  MI (non ST elevation) 3.  HTN 4.  A-fib 5.  Incontinence of urine/nocturia 6.  Hypokalemia 7.  Dyslipidemia 8.  Chronic fatigue 9.   Cardiomegaly 10.  IBS  [name] is a 77 year-old Caucasian female with GIST who presents for follow up today.  The patient was last seen by [place] on 09/19/2010.  Since her last office visit the patient states that she has experienced constant fatigue, SOB with mild exertion, nocturia (urinates approximately 5 times nightly affecting her sleep) and she states that her husband tells her she is missing breaths when she does sleep. The patient has had endoscopy and a colonoscopy on 10/01/2011 in which both are essentially unremarkable.  The patient denies any other symptomatology including no unusual bleeding/bruising, no pain, no chills, no fever, no N/V/D or constipation.   Past Medical History: Past Medical History  Diagnosis Date  . MI, acute, non ST segment elevation Dec.7,2011    cath  . Coronary artery disease     minimal  . Hypertension   . Hyperlipidemia   . Hypokalemia   . Incontinence of urine   . Stromal tumor of the stomach     cancer  . Atrial fibrillation     Surgical History: Past Surgical History  Procedure Date  . Cardiac catheterization 12/07/20210      Dr.Nahser,minor cad  . Stromal stomach  tumor 03/26/2007    resection  . Ankle fracture surgery     right, plate     Current Medications: Current Outpatient Prescriptions  Medication Sig Dispense Refill  . alendronate (FOSAMAX) 70 MG tablet Take 70 mg by mouth every 7 (seven) days. Take with a full glass of water on an empty stomach.       [name] amLODipine (NORVASC) 5 MG tablet TAKE 1 TABLET BY MOUTH EVERY DAY  30 tablet  5  . aspirin 81 MG tablet Take 81 mg by mouth daily.        [name] atorvastatin (LIPITOR) 20 MG tablet Take 20 mg by mouth daily.        . cholecalciferol (VITAMIN D) 1000 UNITS tablet Take 1,000 Units by mouth daily.        [name] doxazosin (CARDURA) 4 MG tablet TAKE 1 TABLET AT BEDTIME  30 tablet  5  . Esomeprazole Magnesium (NEXIUM PO) Take 40 mg by mouth 4 (four) times a week.       . ezetimibe (ZETIA) 10 MG tablet Take 10 mg by mouth daily. Taking 1/2 daily       . KLOR-CON M10 10 MEQ tablet Take 5 mEq by mouth daily.       . Multiple Vitamin (MULTIVITAMIN) tablet Take 1 tablet by mouth daily.        [name]  NITROSTAT 0.4 MG SL tablet DISSOLVE 1 TABLET UNDER THE TONGUE AS NEEDED  25 tablet  3  . Nutritional Supplements (CHOICE DM FIBER-BURST PO) Take by mouth 2 (two) times daily.        . Omega-3 Fatty Acids (FISH OIL BURP-LESS) 1200 MG CAPS Take by mouth daily.       . Rivaroxaban (XARELTO) 20 MG TABS Take 1 tablet (20 mg total) by mouth daily.  30 tablet  5  . valsartan-hydrochlorothiazide (DIOVAN HCT) 160-12.5 MG per tablet Take 1 tablet by mouth 2 (two) times daily.          Allergies: No Known Allergies  Family History: Family History  Problem Relation Age of Onset  . Pancreatic cancer Paternal Grandmother   . Diabetes Paternal Aunt   . Heart disease Paternal Grandfather     both sides of the family    Social History: History  Substance Use Topics  . Smoking status: Never Smoker   . Smokeless tobacco: Never Used  . Alcohol Use: 1.2 oz/week    2 Glasses of wine  per week     one per week    Review of Systems: 10 Point review of systems was completed and is negative except as noted above.   Physical Exam:   Blood pressure 137/81, pulse 96, temperature 97 F (36.1 C), temperature source Oral, resp. rate 18, height 5\' 4"  (1.626 m), weight 159 lb 3.2 oz (72.213 kg).  General appearance: Alert, cooperative, well nourished, no apparent distress Head: Normocephalic, without obvious abnormality, atraumatic Eyes: Conjunctivae/corneas clear, PERRLA, EOMI Nose: Nares, septum and mucosa are normal, no drainage or sinus tenderness Neck: No adenopathy, supple, symmetrical, trachea midline, no tenderness Resp: Diminished bibasilar breath sounds, CTA Cardio: Irregularly irregular rhythm GI: Soft, non-tender, distended, hypoactive bowel sounds, no organomegaly Extremities: Extremities normal, atraumatic, no cyanosis or edema Lymph nodes: Cervical, supraclavicular, and axillary nodes normal Neurologic: Grossly normal   Laboratory Data: Results for orders placed in visit on 11/22/11 (from the past 48 hour(s))  CBC WITH DIFFERENTIAL     Status: Normal   Collection Time   11/22/11  2:30 PM      Component Value Range Comment   WBC 5.7  3.9 - 10.3 10e3/uL    NEUT# 3.6  1.5 - 6.5 10e3/uL    HGB 13.3  11.6 - 15.9 g/dL    HCT 45.4  09.8 - 11.9 %    Platelets 215  145 - 400 10e3/uL    MCV 87.8  79.5 - 101.0 fL    MCH 29.7  25.1 - 34.0 pg    MCHC 33.8  31.5 - 36.0 g/dL    RBC 1.47  8.29 - 5.62 10e6/uL    RDW 13.8  11.2 - 14.5 %    lymph# 1.3  0.9 - 3.3 10e3/uL    MONO# 0.6  0.1 - 0.9 10e3/uL    Eosinophils Absolute 0.1  0.0 - 0.5 10e3/uL    Basophils Absolute 0.1  0.0 - 0.1 10e3/uL    NEUT% 63.8  38.4 - 76.8 %    LYMPH% 23.2  14.0 - 49.7 %    MONO% 10.8  0.0 - 14.0 %    EOS% 1.1  0.0 - 7.0 %    BASO% 1.1  0.0 - 2.0 %   COMPREHENSIVE METABOLIC PANEL (CC13)     Status: Abnormal   Collection Time   11/22/11  2:30 PM      Component Value Range Comment  Sodium 138  136 - 145 mEq/L    Potassium 3.4 (*) 3.5 - 5.1 mEq/L    Chloride 100  98 - 107 mEq/L    CO2 26  22 - 29 mEq/L    Glucose 103 (*) 70 - 99 mg/dl    BUN 29.5  7.0 - 28.4 mg/dL    Creatinine 1.0  0.6 - 1.1 mg/dL    Total Bilirubin 1.32  0.20 - 1.20 mg/dL    Alkaline Phosphatase 47  40 - 150 U/L    AST 19  5 - 34 U/L    ALT 10  0 - 55 U/L    Total Protein 6.8  6.4 - 8.3 g/dL    Albumin 3.9  3.5 - 5.0 g/dL    Calcium 44.0  8.4 - 10.4 mg/dL      Imaging Studies: 1.  03/28/2010  Diagnostic Chest Xray - Cardiomegaly without edema. Stable. 2.  03/28/2010 CT Abd/Pelvis with Contrast -  No recurrent malignancy identified. Cannot exclude pancreas divisum.  Pars defects at L5 with grade 1 anterolisthesis of L5 on S1. 3.  09/14/2010 MR Brain w/o Contrast - Tiny foci of chronic hemorrhage as described without interval development of hydrocephalus. Chronic microvascular ischemic change has progressed since 2009.  No acute intracranial findings. 4. 10/01/2011  EGD - The antrum, gastric body, first and second part of the duodenum were unremarkable.  Retroflexed views revealed a hiatal hernia. 5. 10/01/2011  Colonoscopy - Normal colon, no polyps or cancers.  Impression/Plan: [name] is a 77 year old, Caucasian female who is now almost 5 years from the time of diagnosis. There is no evidence for recurrent disease and she has an excellent prognosis. Dr. Arline Asp gave the patient the option of continuing annual GIST surveillance or contacting us on an as-needed basis if she has questions or concerns. The patient opted for the latter and will contact us on an as-needed basis if she has any questions or concerns.  The patient was asked to contact her primary care physician regarding her chronic fatigue, shortness of breath with mild exertion, nocturia and possible sleep apnea.   Larina Bras, NP-C 11/22/2011, 4:47 PM

## 2011-11-23 ENCOUNTER — Telehealth: Payer: Self-pay | Admitting: *Deleted

## 2011-11-23 NOTE — Telephone Encounter (Signed)
Gave patient appointment for lab only appointment for 12-20-2011 at 10:15am

## 2011-12-11 ENCOUNTER — Telehealth: Payer: Self-pay | Admitting: Internal Medicine

## 2011-12-11 NOTE — Telephone Encounter (Signed)
LMTCB

## 2011-12-11 NOTE — Telephone Encounter (Signed)
New problem:  Need clarification of low dose -ASA  & Xarelto.

## 2011-12-18 NOTE — Telephone Encounter (Signed)
Left msg, she is to be on both per Dr [name].

## 2011-12-18 NOTE — Telephone Encounter (Signed)
Pt states she has called three time and no one answered the phone, so what she needs to know is if she is to continue to take her 81mg  asa with her xarelto ? pls call [phone] ok to leave message

## 2011-12-20 ENCOUNTER — Other Ambulatory Visit: Payer: Medicare Other | Admitting: Lab

## 2011-12-26 ENCOUNTER — Other Ambulatory Visit (HOSPITAL_BASED_OUTPATIENT_CLINIC_OR_DEPARTMENT_OTHER): Payer: Medicare Other | Admitting: Lab

## 2011-12-26 DIAGNOSIS — D481 Neoplasm of uncertain behavior of connective and other soft tissue: Secondary | ICD-10-CM

## 2011-12-26 DIAGNOSIS — C49A2 Gastrointestinal stromal tumor of stomach: Secondary | ICD-10-CM

## 2011-12-28 ENCOUNTER — Telehealth: Payer: Self-pay | Admitting: *Deleted

## 2011-12-28 NOTE — Telephone Encounter (Signed)
Called pt at home and left message on voice mail re:  LDH level ok now  209  as per md's instructions.

## 2012-01-08 ENCOUNTER — Other Ambulatory Visit: Payer: Self-pay | Admitting: *Deleted

## 2012-01-08 MED ORDER — DOXAZOSIN MESYLATE 4 MG PO TABS: 4.0000 mg | ORAL_TABLET | Freq: Every day | ORAL | Status: DC

## 2012-01-08 NOTE — Telephone Encounter (Signed)
Pt needs appointment then refill can be made Fax Received. Refill Completed. [name] (R.M.A)   

## 2012-01-29 ENCOUNTER — Ambulatory Visit: Payer: Medicare Other | Admitting: Cardiovascular Disease

## 2012-03-11 ENCOUNTER — Telehealth: Payer: Self-pay | Admitting: Cardiovascular Disease

## 2012-03-11 NOTE — Telephone Encounter (Signed)
Walk in Pt Form " Pt Dropped off papers For Dr.Nahser Sent to [facility] 03/11/12/KM

## 2012-03-18 ENCOUNTER — Other Ambulatory Visit: Payer: Self-pay | Admitting: *Deleted

## 2012-03-18 NOTE — Telephone Encounter (Signed)
Prior auth obtained for xarelto 20 mg daily ref # [id number], pt was left a msg and i called a msg to [facility] they needed to call the Help desk of her insurance for correct billing numbers @ [phone].

## 2012-05-06 ENCOUNTER — Other Ambulatory Visit: Payer: Self-pay | Admitting: *Deleted

## 2012-05-06 DIAGNOSIS — E785 Hyperlipidemia, unspecified: Secondary | ICD-10-CM

## 2012-05-06 DIAGNOSIS — I10 Essential (primary) hypertension: Secondary | ICD-10-CM

## 2012-05-06 DIAGNOSIS — I4891 Unspecified atrial fibrillation: Secondary | ICD-10-CM

## 2012-05-06 MED ORDER — RIVAROXABAN 20 MG PO TABS: 20.0000 mg | ORAL_TABLET | Freq: Every day | ORAL | Status: DC

## 2012-05-06 NOTE — Telephone Encounter (Signed)
Fax Received. Refill Completed. [place] Miyoshi Chowoe (R.M.A)   

## 2012-05-13 ENCOUNTER — Other Ambulatory Visit: Payer: Self-pay | Admitting: *Deleted

## 2012-05-13 MED ORDER — AMLODIPINE BESYLATE 5 MG PO TABS: 5.0000 mg | ORAL_TABLET | Freq: Every day | ORAL | Status: DC

## 2012-05-13 NOTE — Telephone Encounter (Signed)
Fax Received. Refill Completed. [name] (R.M.A)   

## 2012-05-19 ENCOUNTER — Encounter: Payer: Self-pay | Admitting: *Deleted

## 2012-05-22 ENCOUNTER — Other Ambulatory Visit: Payer: Self-pay | Admitting: Orthopedic Surgery

## 2012-05-22 ENCOUNTER — Encounter (HOSPITAL_BASED_OUTPATIENT_CLINIC_OR_DEPARTMENT_OTHER): Payer: Self-pay | Admitting: *Deleted

## 2012-05-22 NOTE — Progress Notes (Signed)
To come in for bmet-ekg-sees dr Melburn Popper for a small mi yrs ago-still some fatigue-hx AF-is on xarelto-to stop 3 days proir to surgery ,echo 10/12-ef 60% Does snore-had sleep study but never heard back-will ck

## 2012-05-23 ENCOUNTER — Encounter (HOSPITAL_BASED_OUTPATIENT_CLINIC_OR_DEPARTMENT_OTHER)
Admission: RE | Admit: 2012-05-23 | Discharge: 2012-05-23 | Disposition: A | Discharge status: Home / Self Care | Payer: Medicare Other | Source: Ambulatory Visit | Attending: Orthopedic Surgery | Admitting: Orthopedic Surgery

## 2012-05-23 LAB — BASIC METABOLIC PANEL
CO2: 31 mEq/L (ref 19–32)
Calcium: 9.9 mg/dL (ref 8.4–10.5)
Chloride: 98 mEq/L (ref 96–112)
GFR calc Af Amer: 70 mL/min — ABNORMAL LOW (ref >90)
Sodium: 136 mEq/L (ref 135–145)

## 2012-05-23 NOTE — Progress Notes (Signed)
EKG reviewed by Dr Crews. 

## 2012-05-23 NOTE — Progress Notes (Signed)
Got sleep study from dr perini-it did have severe sleep apnea-was not able to tolerate mask-she said she never heard back from dr perini-will bring all meds may need to stay post op if any resp problems Pt aware

## 2012-05-28 ENCOUNTER — Encounter (HOSPITAL_BASED_OUTPATIENT_CLINIC_OR_DEPARTMENT_OTHER): Payer: Self-pay | Admitting: *Deleted

## 2012-05-28 ENCOUNTER — Encounter (HOSPITAL_BASED_OUTPATIENT_CLINIC_OR_DEPARTMENT_OTHER)
Admission: RE | Disposition: A | Discharge status: Home / Self Care | Payer: Self-pay | Source: Ambulatory Visit | Attending: Orthopedic Surgery

## 2012-05-28 ENCOUNTER — Ambulatory Visit (HOSPITAL_BASED_OUTPATIENT_CLINIC_OR_DEPARTMENT_OTHER)
Admission: RE | Admit: 2012-05-28 | Discharge: 2012-05-28 | Disposition: A | Discharge status: Home / Self Care | Payer: Medicare Other | Source: Ambulatory Visit | Attending: Orthopedic Surgery | Admitting: Orthopedic Surgery

## 2012-05-28 ENCOUNTER — Encounter (HOSPITAL_BASED_OUTPATIENT_CLINIC_OR_DEPARTMENT_OTHER): Payer: Self-pay | Admitting: Anesthesiology

## 2012-05-28 ENCOUNTER — Ambulatory Visit (HOSPITAL_BASED_OUTPATIENT_CLINIC_OR_DEPARTMENT_OTHER): Payer: Medicare Other | Admitting: Anesthesiology

## 2012-05-28 DIAGNOSIS — I2510 Atherosclerotic heart disease of native coronary artery without angina pectoris: Secondary | ICD-10-CM | POA: Insufficient documentation

## 2012-05-28 DIAGNOSIS — I252 Old myocardial infarction: Secondary | ICD-10-CM | POA: Insufficient documentation

## 2012-05-28 DIAGNOSIS — I4891 Unspecified atrial fibrillation: Secondary | ICD-10-CM | POA: Insufficient documentation

## 2012-05-28 DIAGNOSIS — M23329 Other meniscus derangements, posterior horn of medial meniscus, unspecified knee: Secondary | ICD-10-CM | POA: Insufficient documentation

## 2012-05-28 DIAGNOSIS — M2240 Chondromalacia patellae, unspecified knee: Secondary | ICD-10-CM | POA: Insufficient documentation

## 2012-05-28 DIAGNOSIS — M23302 Other meniscus derangements, unspecified lateral meniscus, unspecified knee: Secondary | ICD-10-CM | POA: Insufficient documentation

## 2012-05-28 DIAGNOSIS — I10 Essential (primary) hypertension: Secondary | ICD-10-CM | POA: Insufficient documentation

## 2012-05-28 HISTORY — PX: KNEE ARTHROSCOPY: SHX127

## 2012-05-28 HISTORY — DX: Unspecified osteoarthritis, unspecified site: M19.90

## 2012-05-28 HISTORY — DX: Chronic kidney disease, unspecified: N18.9

## 2012-05-28 HISTORY — DX: Gastro-esophageal reflux disease without esophagitis: K21.9

## 2012-05-28 LAB — POCT HEMOGLOBIN-HEMACUE: Hemoglobin: 12.6 g/dL (ref 12.0–15.0)

## 2012-05-28 SURGERY — ARTHROSCOPY, KNEE
Anesthesia: General | Site: Knee | Laterality: Right | Wound class: Clean

## 2012-05-28 MED ORDER — MIDAZOLAM HCL 2 MG/2ML IJ SOLN: 1.0000 mg | INTRAMUSCULAR | Status: DC | PRN

## 2012-05-28 MED ORDER — ONDANSETRON HCL 4 MG/2ML IJ SOLN
INTRAMUSCULAR | Status: DC | PRN
  Administered 2012-05-28: 4 mg via INTRAVENOUS

## 2012-05-28 MED ORDER — KETOROLAC TROMETHAMINE 30 MG/ML IJ SOLN
INTRAMUSCULAR | Status: DC | PRN
  Administered 2012-05-28: 30 mg via INTRAVENOUS

## 2012-05-28 MED ORDER — CEFAZOLIN SODIUM-DEXTROSE 2-3 GM-% IV SOLR: 2.0000 g | INTRAVENOUS | Status: DC

## 2012-05-28 MED ORDER — FENTANYL CITRATE 0.05 MG/ML IJ SOLN: 50.0000 ug | INTRAMUSCULAR | Status: DC | PRN

## 2012-05-28 MED ORDER — DEXAMETHASONE SODIUM PHOSPHATE 4 MG/ML IJ SOLN
INTRAMUSCULAR | Status: DC | PRN
  Administered 2012-05-28: 5 mg via INTRAVENOUS

## 2012-05-28 MED ORDER — OXYCODONE-ACETAMINOPHEN 5-325 MG PO TABS: 1.0000 | ORAL_TABLET | Freq: Four times a day (QID) | ORAL | Status: DC | PRN

## 2012-05-28 MED ORDER — LACTATED RINGERS IV SOLN
INTRAVENOUS | Status: DC
  Administered 2012-05-28 (×2): via INTRAVENOUS

## 2012-05-28 MED ORDER — PROPOFOL 10 MG/ML IV BOLUS
INTRAVENOUS | Status: DC | PRN
  Administered 2012-05-28: 120 mg via INTRAVENOUS

## 2012-05-28 MED ORDER — BUPIVACAINE HCL (PF) 0.5 % IJ SOLN
INTRAMUSCULAR | Status: DC | PRN
  Administered 2012-05-28: 30 mL

## 2012-05-28 MED ORDER — CEFAZOLIN SODIUM-DEXTROSE 2-3 GM-% IV SOLR
INTRAVENOUS | Status: DC | PRN
  Administered 2012-05-28: 2 g via INTRAVENOUS

## 2012-05-28 MED ORDER — SODIUM CHLORIDE 0.9 % IR SOLN
Status: DC | PRN
  Administered 2012-05-28: 3000 mL

## 2012-05-28 MED ORDER — OXYCODONE HCL 5 MG/5ML PO SOLN: 5.0000 mg | Freq: Once | ORAL | Status: DC | PRN

## 2012-05-28 MED ORDER — PROPOFOL INFUSION 10 MG/ML OPTIME
INTRAVENOUS | Status: DC | PRN
  Administered 2012-05-28: 100 ug/kg/min via INTRAVENOUS

## 2012-05-28 MED ORDER — OXYCODONE HCL 5 MG PO TABS: 5.0000 mg | ORAL_TABLET | Freq: Once | ORAL | Status: DC | PRN

## 2012-05-28 MED ORDER — LIDOCAINE HCL (CARDIAC) 20 MG/ML IV SOLN
INTRAVENOUS | Status: DC | PRN
  Administered 2012-05-28: 40 mg via INTRAVENOUS

## 2012-05-28 MED ORDER — HYDROMORPHONE HCL PF 1 MG/ML IJ SOLN: 0.2500 mg | INTRAMUSCULAR | Status: DC | PRN

## 2012-05-28 MED ORDER — ACETAMINOPHEN 10 MG/ML IV SOLN
1000.0000 mg | Freq: Once | INTRAVENOUS | Status: AC
  Administered 2012-05-28: 1000 mg via INTRAVENOUS

## 2012-05-28 MED ORDER — POVIDONE-IODINE 7.5 % EX SOLN: Freq: Once | CUTANEOUS | Status: DC

## 2012-05-28 MED ORDER — FENTANYL CITRATE 0.05 MG/ML IJ SOLN
INTRAMUSCULAR | Status: DC | PRN
  Administered 2012-05-28: 50 ug via INTRAVENOUS
  Administered 2012-05-28: 25 ug via INTRAVENOUS

## 2012-05-28 SURGICAL SUPPLY — 40 items
BANDAGE ELASTIC 6 VELCRO ST LF (GAUZE/BANDAGES/DRESSINGS) ×2 IMPLANT
BLADE 4.2CUDA (BLADE) IMPLANT
BLADE GREAT WHITE 4.2 (BLADE) ×2 IMPLANT
CANISTER OMNI JUG 16 LITER (MISCELLANEOUS) IMPLANT
CANISTER SUCTION 2500CC (MISCELLANEOUS) IMPLANT
CLOTH BEACON ORANGE TIMEOUT ST (SAFETY) ×2 IMPLANT
CUTTER MENISCUS  4.2MM (BLADE)
CUTTER MENISCUS 4.2MM (BLADE) IMPLANT
DRAPE ARTHROSCOPY W/POUCH 114 (DRAPES) ×2 IMPLANT
DRSG EMULSION OIL 3X3 NADH (GAUZE/BANDAGES/DRESSINGS) ×2 IMPLANT
DURAPREP 26ML APPLICATOR (WOUND CARE) ×2 IMPLANT
ELECT MENISCUS 165MM 90D (ELECTRODE) IMPLANT
ELECT REM PT RETURN 9FT ADLT (ELECTROSURGICAL)
ELECTRODE REM PT RTRN 9FT ADLT (ELECTROSURGICAL) IMPLANT
GLOVE BIO SURGEON STRL SZ 6.5 (GLOVE) ×2 IMPLANT
GLOVE BIO SURGEON STRL SZ7.5 (GLOVE) ×2 IMPLANT
GLOVE BIOGEL PI IND STRL 8 (GLOVE) ×3 IMPLANT
GLOVE BIOGEL PI INDICATOR 8 (GLOVE) ×3
GLOVE ECLIPSE 7.5 STRL STRAW (GLOVE) ×4 IMPLANT
GLOVE INDICATOR 7.0 STRL GRN (GLOVE) ×2 IMPLANT
GOWN BRE IMP PREV XXLGXLNG (GOWN DISPOSABLE) IMPLANT
GOWN PREVENTION PLUS XLARGE (GOWN DISPOSABLE) ×4 IMPLANT
GOWN PREVENTION PLUS XXLARGE (GOWN DISPOSABLE) ×4 IMPLANT
HOLDER KNEE FOAM BLUE (MISCELLANEOUS) ×2 IMPLANT
KNEE WRAP E Z 3 GEL PACK (MISCELLANEOUS) ×2 IMPLANT
NDL SAFETY ECLIPSE 18X1.5 (NEEDLE) IMPLANT
NEEDLE HYPO 18GX1.5 SHARP (NEEDLE)
PACK ARTHROSCOPY DSU (CUSTOM PROCEDURE TRAY) ×2 IMPLANT
PACK BASIN DAY SURGERY FS (CUSTOM PROCEDURE TRAY) ×2 IMPLANT
PAD CAST 4YDX4 CTTN HI CHSV (CAST SUPPLIES) ×1 IMPLANT
PADDING CAST COTTON 4X4 STRL (CAST SUPPLIES) ×1
PENCIL BUTTON HOLSTER BLD 10FT (ELECTRODE) IMPLANT
SET ARTHROSCOPY TUBING (MISCELLANEOUS) ×1
SET ARTHROSCOPY TUBING LN (MISCELLANEOUS) ×1 IMPLANT
SPONGE GAUZE 4X4 12PLY (GAUZE/BANDAGES/DRESSINGS) ×2 IMPLANT
SUT ETHILON 4 0 PS 2 18 (SUTURE) IMPLANT
SYR 5ML LL (SYRINGE) ×2 IMPLANT
TOWEL OR 17X24 6PK STRL BLUE (TOWEL DISPOSABLE) ×2 IMPLANT
TOWEL OR NON WOVEN STRL DISP B (DISPOSABLE) ×2 IMPLANT
WATER STERILE IRR 1000ML POUR (IV SOLUTION) ×2 IMPLANT

## 2012-05-28 NOTE — Anesthesia Preprocedure Evaluation (Addendum)
Anesthesia Evaluation  Patient identified by MRN, date of birth, ID band Patient awake    Reviewed: Allergy & Precautions, H&P , NPO status , Patient's Chart, lab work & pertinent test results  Airway Mallampati: II TM Distance: >3 FB Neck ROM: Full    Dental no notable dental hx. (+) Teeth Intact and Dental Advisory Given   Pulmonary shortness of breath,  breath sounds clear to auscultation  Pulmonary exam normal       Cardiovascular hypertension, On Medications + CAD and + Past MI + dysrhythmias Atrial Fibrillation Rhythm:Irregular Rate:Normal     Neuro/Psych negative neurological ROS  negative psych ROS   GI/Hepatic Neg liver ROS, GERD-  Medicated and Controlled,  Endo/Other  negative endocrine ROS  Renal/GU negative Renal ROS  negative genitourinary   Musculoskeletal   Abdominal   Peds  Hematology negative hematology ROS (+)   Anesthesia Other Findings   Reproductive/Obstetrics negative OB ROS                          Anesthesia Physical Anesthesia Plan  ASA: III  Anesthesia Plan: General   Post-op Pain Management:    Induction: Intravenous  Airway Management Planned: LMA  Additional Equipment:   Intra-op Plan:   Post-operative Plan: Extubation in OR  Informed Consent: I have reviewed the patients History and Physical, chart, labs and discussed the procedure including the risks, benefits and alternatives for the proposed anesthesia with the patient or authorized representative who has indicated his/her understanding and acceptance.   Dental advisory given  Plan Discussed with: CRNA  Anesthesia Plan Comments:         Anesthesia Quick Evaluation

## 2012-05-28 NOTE — Anesthesia Procedure Notes (Signed)
Procedure Name: LMA Insertion Date/Time: 05/28/2012 11:42 AM Performed by: Meyer Russel Pre-anesthesia Checklist: Patient identified, Emergency Drugs available, Suction available and Patient being monitored Patient Re-evaluated:Patient Re-evaluated prior to inductionOxygen Delivery Method: Circle System Utilized Preoxygenation: Pre-oxygenation with 100% oxygen Intubation Type: IV induction Ventilation: Mask ventilation without difficulty LMA: LMA inserted LMA Size: 4.0 Number of attempts: 1 Airway Equipment and Method: bite block Placement Confirmation: positive ETCO2 and breath sounds checked- equal and bilateral Tube secured with: Tape Dental Injury: Teeth and Oropharynx as per pre-operative assessment

## 2012-05-28 NOTE — Anesthesia Postprocedure Evaluation (Signed)
  Anesthesia Post-op Note  [name]  Procedure(s) Performed: Procedure(s): RIGHT KNEE ARTHROSCOPY PARTIAL MEDIAL AND LATERAL MENISECTOMY WITH CHONDROPLASTY (Right)  Patient Location: PACU  Anesthesia Type:General  Level of Consciousness: awake and alert   Airway and Oxygen Therapy: Patient Spontanous Breathing and Patient connected to face mask oxygen  Post-op Pain: none  Post-op Assessment: Post-op Vital signs reviewed, Patient's Cardiovascular Status Stable, Respiratory Function Stable, Patent Airway and No signs of Nausea or vomiting  Post-op Vital Signs: Reviewed and stable  Complications: No apparent anesthesia complications

## 2012-05-28 NOTE — Transfer of Care (Signed)
Immediate Anesthesia Transfer of Care Note  [name]  Procedure(s) Performed: Procedure(s): RIGHT KNEE ARTHROSCOPY PARTIAL MEDIAL AND LATERAL MENISECTOMY WITH CHONDROPLASTY (Right)  Patient Location: PACU  Anesthesia Type:General  Level of Consciousness: sedated and unresponsive  Airway & Oxygen Therapy: Patient Spontanous Breathing and Patient connected to face mask oxygen  Post-op Assessment: Report given to PACU RN and Post -op Vital signs reviewed and stable  Post vital signs: Reviewed and stable  Complications: No apparent anesthesia complications

## 2012-05-28 NOTE — H&P (Signed)
PREOPERATIVE H&P  Chief Complaint: r knee pain  HPI: [name] is a 78 y.o. female who presents for evaluation of r. Knee pain. It has been present for greater than 3 months and has been worsening. She has failed conservative measures. Pain is rated as moderate.  Past Medical History  Diagnosis Date  . MI, acute, non ST segment elevation Dec.7,2011    cath  . Coronary artery disease     minimal  . Hypertension   . Hyperlipidemia   . Hypokalemia   . Incontinence of urine   . Stromal tumor of the stomach     cancer  . Atrial fibrillation   . Arthritis   . GERD (gastroesophageal reflux disease)   . Chronic kidney disease     urinary incont-   Past Surgical History  Procedure Laterality Date  . Stromal stomach  tumor  03/26/2007    resection  . Ankle fracture surgery  2006    right, plate   . Cardiac catheterization  12/07/20210    Dr.Nahser,minor cad  . Eye surgery      both cataracts  . Colonoscopy    . Upper gi endoscopy     History   Social History  . Marital Status: Married    Spouse Name: N/A    Number of Children: 2  . Years of Education: N/A   Occupational History  . retired    Social History Main Topics  . Smoking status: Never Smoker   . Smokeless tobacco: Never Used  . Alcohol Use: 1.2 oz/week    2 Glasses of wine per week     Comment: one per week  . Drug Use: No  . Sexually Active: None   Other Topics Concern  . None   Social History Narrative  . None   Family History  Problem Relation Age of Onset  . Pancreatic cancer Paternal Grandmother   . Diabetes Paternal Aunt   . Heart disease Paternal Grandfather     both sides of the family   Allergies  Allergen Reactions  . Pradaxa (Dabigatran Etexilate Mesylate) Nausea Only   Prior to Admission medications   Medication Sig Start Date End Date Taking? Authorizing Provider  alendronate (FOSAMAX) 70 MG tablet Take 70 mg by mouth every 7 (seven) days. Take with a full glass of water on  an empty stomach.    Yes Historical Provider, MD  amLODipine (NORVASC) 5 MG tablet Take 1 tablet (5 mg total) by mouth daily. 05/13/12  Yes Vesta Mixer, MD  aspirin 81 MG tablet Take 81 mg by mouth daily.     Yes Historical Provider, MD  atorvastatin (LIPITOR) 20 MG tablet Take 20 mg by mouth daily.     Yes Historical Provider, MD  cholecalciferol (VITAMIN D) 1000 UNITS tablet Take 1,000 Units by mouth daily.     Yes Historical Provider, MD  doxazosin (CARDURA) 4 MG tablet Take 1 tablet (4 mg total) by mouth at bedtime. 01/08/12  Yes Vesta Mixer, MD  Esomeprazole Magnesium (NEXIUM PO) Take 40 mg by mouth 4 (four) times a week.    Yes Historical Provider, MD  ezetimibe (ZETIA) 10 MG tablet Take 10 mg by mouth daily. Taking 1/2 daily    Yes Historical Provider, MD  KLOR-CON M10 10 MEQ tablet Take 5 mEq by mouth daily.  08/16/11  Yes Historical Provider, MD  Multiple Vitamin (MULTIVITAMIN) tablet Take 1 tablet by mouth daily.     Yes Historical Provider,  MD  Nutritional Supplements (CHOICE DM FIBER-BURST PO) Take by mouth 2 (two) times daily.     Yes Historical Provider, MD  Rivaroxaban (XARELTO) 20 MG TABS Take 1 tablet (20 mg total) by mouth daily. 05/06/12  Yes Vesta Mixer, MD  valsartan-hydrochlorothiazide (DIOVAN HCT) 160-12.5 MG per tablet Take 1 tablet by mouth 2 (two) times daily.     Yes Historical Provider, MD  NITROSTAT 0.4 MG SL tablet DISSOLVE 1 TABLET UNDER THE TONGUE AS NEEDED 08/16/10   Vesta Mixer, MD  Omega-3 Fatty Acids (FISH OIL BURP-LESS) 1200 MG CAPS Take by mouth daily.     Historical Provider, MD     Positive ROS: none  All other systems have been reviewed and were otherwise negative with the exception of those mentioned in the HPI and as above.  Physical Exam: Filed Vitals:   05/28/12 0916  BP: 144/95  Pulse: 88  Temp: 98.3 F (36.8 C)  Resp: 18    General: Alert, no acute distress Cardiovascular: No pedal edema Respiratory: No cyanosis, no use of  accessory musculature GI: No organomegaly, abdomen is soft and non-tender Skin: No lesions in the area of chief complaint Neurologic: Sensation intact distally Psychiatric: Patient is competent for consent with normal mood and affect Lymphatic: No axillary or cervical lymphadenopathy  MUSCULOSKELETAL: R knee pain:with med jt line tender  MRI + for med and lat meniscal tears  Assessment/Plan: MEDIAL AND LATERAL MENISCUS TEARS RIGHT KNEE  Plan for Procedure(s): RIGHT KNEE ARTHROSCOPY  The risks benefits and alternatives were discussed with the patient including but not limited to the risks of nonoperative treatment, versus surgical intervention including infection, bleeding, nerve injury, malunion, nonunion, hardware prominence, hardware failure, need for hardware removal, blood clots, cardiopulmonary complications, morbidity, mortality, among others, and they were willing to proceed.  Predicted outcome is good, although there will be at least a six to nine month expected recovery.  [name] L, MD 05/28/2012 11:02 AM

## 2012-05-28 NOTE — Brief Op Note (Signed)
05/28/2012  12:15 PM  PATIENT:  [name]  78 y.o. female  PRE-OPERATIVE DIAGNOSIS:  MEDIAL AND LATERAL MENISCUS TEARS RIGHT KNEE   POST-OPERATIVE DIAGNOSIS:  MEDIAL AND LATERAL MENISCUS TEARS WITH CHONDROMALACIA RIGHT KNEE  PROCEDURE:  Procedure(s): RIGHT KNEE ARTHROSCOPY PARTIAL MEDIAL AND LATERAL MENISECTOMY WITH CHONDROPLASTY (Right)  SURGEON:  Surgeon(s) and Role:    * Harvie Junior, MD - Primary  PHYSICIAN ASSISTANT:   ASSISTANTS: bethune   ANESTHESIA:   general  EBL:  Total I/O In: 1000 [I.V.:1000] Out: -   BLOOD ADMINISTERED:none  DRAINS: none   LOCAL MEDICATIONS USED:  MARCAINE     SPECIMEN:  No Specimen  DISPOSITION OF SPECIMEN:  N/A  COUNTS:  YES  TOURNIQUET:  * No tourniquets in log *  DICTATION: .Other Dictation: Dictation Number [id number]  PLAN OF CARE: Discharge to home after PACU  PATIENT DISPOSITION:  PACU - hemodynamically stable.   Delay start of Pharmacological VTE agent (>24hrs) due to surgical blood loss or risk of bleeding: no

## 2012-05-29 ENCOUNTER — Encounter (HOSPITAL_BASED_OUTPATIENT_CLINIC_OR_DEPARTMENT_OTHER): Payer: Self-pay | Admitting: Orthopedic Surgery

## 2012-05-30 NOTE — Op Note (Signed)
[name], [name]               ACCOUNT NO.:  [id number]  MEDICAL RECORD NO.:  [id number]  LOCATION:                               FACILITY:  [facility]  PHYSICIAN:  Harvie Junior, M.D.   DATE OF BIRTH:  08/25/1934  DATE OF PROCEDURE:  05/28/2012 DATE OF DISCHARGE:  05/28/2012                              OPERATIVE REPORT   PREOPERATIVE DIAGNOSIS:  Medial and lateral meniscal tears with chondromalacia patella.  POSTOPERATIVE DIAGNOSES:  Medial and lateral meniscal tears, chondromalacia patella, and chondromalacia in both the medial lateral compartments.  PROCEDURE: 1. Partial medial and partial lateral meniscectomy with corresponding     debridement of medial and lateral compartments. 2. Chondroplasty patellofemoral joint.  SURGEON:  Harvie Junior, M.D.  ASSISTANT:  Marshia Ly, P.A.  ANESTHESIA:  General.  BRIEF HISTORY:  Ms. Petrak is a 78 year old female with a history of having significant complaints of right knee pain.  We treated conservatively for a period of time.  After failure of all conservative care, she was taken to the operating room for operative knee arthroscopy.  Preoperative MRI showed she had both medial and lateral meniscus tears.  Preoperative weightbearing film showed narrowing, but no bone-on-bone changes.  She understood that there was a possibility that intraoperatively we will find significant bone-on-bone change and she was willing to take this risk, and came to the operating room for operative knee arthroscopy.  PROCEDURE IN DETAIL:  The patient was taken to the operating room. After adequate anesthesia was obtained with general anesthetic, the patient was placed supine on the operating table.  The right leg was prepped and draped in the usual sterile fashion.  Following this routine arthroscopic examination of the knee revealed there was an obvious posterior horn medial meniscal tear which was debrided back to a smooth and stable rim.   Attention was turned to the anterior cruciate which was normal.  Attention was turned to the lateral side which had a mid body to posterior horn medial meniscal tear which was debrided back to a smooth and stable rim.  There was chondromalacia both in the medial and lateral compartments which were debrided back to a smooth and stable rim.  There was no bone-on-bone change in either medial or lateral compartment.  Attention was turned to the patellofemoral compartment, there was grade 2 and 3 change which was debrided back to a smooth and stable rim. At this point, the knee was copiously and thoroughly lavaged, suctioned, dried all sterile portals were closed with bandage.  Sterile compressive dressing was applied.  The patient was taken to recovery, was noted to be in satisfactory condition.  Estimated blood loss for the procedure was none.     Harvie Junior, M.D.     Ranae Plumber  D:  05/28/2012  T:  05/29/2012  Job:  [id number]

## 2012-09-09 ENCOUNTER — Ambulatory Visit (INDEPENDENT_AMBULATORY_CARE_PROVIDER_SITE_OTHER): Payer: Medicare Other | Admitting: Internal Medicine

## 2012-09-09 ENCOUNTER — Encounter: Payer: Self-pay | Admitting: Internal Medicine

## 2012-09-09 VITALS — BP 122/66 | HR 95 | Ht 64.0 in | Wt 164.0 lb

## 2012-09-09 DIAGNOSIS — K219 Gastro-esophageal reflux disease without esophagitis: Secondary | ICD-10-CM

## 2012-09-09 DIAGNOSIS — R195 Other fecal abnormalities: Secondary | ICD-10-CM

## 2012-09-09 NOTE — Progress Notes (Signed)
HISTORY OF PRESENT ILLNESS:  Sabrina ABBRUZZESE is a 78 y.o. female with multiple significant medical problems who is sent today by Dr. Waynard Edwards regarding Hemoccult-positive stool on repeat outpatient testing. The patient was evaluated in May of 2013 for the same. See that dictation for details. At that time she was on aspirin and Pradaxa. She does have a history of a GI stromal tumor resection remotely. Her most recent colonoscopy and upper endoscopy were performed 09/04/2011. Complete colonoscopy with excellent preparation was found to be normal. Upper endoscopy was normal except for expected postoperative changes. Her hemoglobin last year was 12.5. Review of laboratories from June of 2014 reveal normal hemoglobin of 13.1 and normal MCV of 87.2. Hemoccult testing returned positive on 2 separate occasions. She is currently on Xarelto in addition to aspirin for atrial fibrillation. She takes Nexium daily for GERD.[name] Her GI review of systems is entirely negative.  REVIEW OF SYSTEMS:  All non-GI ROS negative except upon review  Past Medical History  Diagnosis Date  . MI, acute, non ST segment elevation Dec.7,2011    cath  . Coronary artery disease     minimal  . Hypertension   . Hyperlipidemia   . Hypokalemia   . Incontinence of urine   . Stromal tumor of the stomach     cancer  . Atrial fibrillation   . Arthritis   . GERD (gastroesophageal reflux disease)   . Chronic kidney disease     urinary incont-  . Hiatal hernia   . Osteopenia   . Bladder polyps   . IBS (irritable bowel syndrome)   . Cervical polyp   . Cataracts, bilateral   . IFG (impaired fasting glucose)     Past Surgical History  Procedure Laterality Date  . Stromal stomach  tumor  03/26/2007    resection  . Ankle fracture surgery  2006    right, plate   . Cardiac catheterization  12/07/20210    Dr.Nahser,minor cad  . Eye surgery      both cataracts  . Colonoscopy    . Upper gi endoscopy    . Knee arthroscopy Right  05/28/2012    Procedure: RIGHT KNEE ARTHROSCOPY PARTIAL MEDIAL AND LATERAL MENISECTOMY WITH CHONDROPLASTY;  Surgeon: Harvie Junior, MD;  Location: [facility];  Service: Orthopedics;  Laterality: Right;    Social History [name]  reports that she has never smoked. She has never used smokeless tobacco. She reports that she drinks about 1.2 ounces of alcohol per week. She reports that she does not use illicit drugs.  family history includes Diabetes in her paternal aunt; Heart disease in her paternal grandfather; and Pancreatic cancer in her paternal grandmother.  Allergies  Allergen Reactions  . Pradaxa (Dabigatran Etexilate Mesylate) Nausea Only       PHYSICAL EXAMINATION: Vital signs: BP 122/66  Pulse 95  Ht 5\' 4"  (1.626 m)  Wt 164 lb (74.39 kg)  BMI 28.14 kg/m2  SpO2 96% General: Well-developed, well-nourished, no acute distress HEENT: Sclerae are anicteric, conjunctiva pink. Oral mucosa intact Lungs: Clear Heart: Regular Abdomen: soft, nontender, nondistended, no obvious ascites, no peritoneal signs, normal bowel sounds. No organomegaly. Extremities: No edema Psychiatric: alert and oriented x3. Cooperative   ASSESSMENT:  #1. Hemoccult-positive stool. This in an asymptomatic 78 year old on Xarelto and aspirin. Normal colonoscopy and upper endoscopy one year ago to evaluate the same. Continues with normal hemoglobin and MCV. As well, she remains on chronic PPI therapy which provides protection to  the upper GI mucosa. I suspect that she may have nonspecific mucosal oozing from her blood thinning agents. We have observed this in recent years with the advent of capsule endoscopy. Generally, this phenomenon occurs in the small bowel. Fortunately, for this patient, she is having no clinically relevant issues (such as bleeding or anemia). We discussed in detail today the risks, benefits, and alternatives to repeating endoscopic procedures and/or considering capsule  endoscopy. Mutually, we have agreed for a strategy of expectant management. As such, she will resume general medical care with Dr. Waynard Edwards. He will monitor her clinically for any evidence of significant GI bleeding and/or the development of significant anemia. In either circumstance, she could be reassessed, if needed. I agree with Dr. Waynard Edwards, that in patients like this, routine Hemoccult studies as part of the annual physical exam are probably not helpful or indicated.

## 2012-09-09 NOTE — Patient Instructions (Addendum)
Please follow up with Dr. Perry as needed 

## 2012-09-16 ENCOUNTER — Telehealth: Payer: Self-pay | Admitting: *Deleted

## 2012-09-16 NOTE — Telephone Encounter (Signed)
Spoke with this patient today regarding her sleep study results from November 2013.  She had a split night study which documented an AHI of 46.8/hr, CPAP was applied but [name] proved intolerant to CPAP therapy and was never able to sleep while wearing it.  Dr. Vickey Huger recommended a desensitization procedure by a DME company and then a full night of titration after this had been done.  Patient never heard from anyone.  I was apologetic today and explained that's not how we do things.  We discussed her initial study and some of her issues, she explains she has trouble during dental appointments as well, because she feels like she has trouble breathing through her nose at times.  She also explains she has quite a bit of claustrophobia.  We discussed Dr. Oliva Bustard recommendations and I explained the desensitization procedure.  She sounded agreeable and liked that we would take plenty of time with her to help her and that there was no pressure to sleep.  She will check her schedule (because she has some company coming in the next two weeks) and call me back with a time that works best for her.  I explained that depending on the outcome of this desensitization, we would then proceed to CPAP titration in the sleep lab to try and find an ideal pressure setting or discussion with Dr. Vickey Huger about alternative therapies which would not be ideal treatment but would still be treatment.    Due to Medicare criteria for CPAP, [name] will need to meet with Dr. Vickey Huger prior to her second sleep study (CPAP Titration appointment), so I will be sure and schedule that after our desensitization.  This will be a good opportunity for her to have all questions answered and to discuss possible alternatives to CPAP if necesesary.   I mailed a copy of study to patient's home today and enclosed my card so she can call when she is ready to schedule the desensitization.

## 2012-11-10 ENCOUNTER — Other Ambulatory Visit: Payer: Self-pay | Admitting: *Deleted

## 2012-11-10 DIAGNOSIS — I10 Essential (primary) hypertension: Secondary | ICD-10-CM

## 2012-11-10 DIAGNOSIS — I4891 Unspecified atrial fibrillation: Secondary | ICD-10-CM

## 2012-11-10 DIAGNOSIS — E785 Hyperlipidemia, unspecified: Secondary | ICD-10-CM

## 2012-11-10 MED ORDER — RIVAROXABAN 20 MG PO TABS: 20.0000 mg | ORAL_TABLET | Freq: Every day | ORAL | Status: DC

## 2012-11-14 ENCOUNTER — Other Ambulatory Visit: Payer: Self-pay

## 2012-11-14 MED ORDER — AMLODIPINE BESYLATE 5 MG PO TABS: 5.0000 mg | ORAL_TABLET | Freq: Every day | ORAL | Status: DC

## 2012-12-08 ENCOUNTER — Telehealth: Payer: Self-pay | Admitting: Cardiovascular Disease

## 2012-12-08 DIAGNOSIS — I4891 Unspecified atrial fibrillation: Secondary | ICD-10-CM

## 2012-12-08 DIAGNOSIS — I10 Essential (primary) hypertension: Secondary | ICD-10-CM

## 2012-12-08 DIAGNOSIS — E785 Hyperlipidemia, unspecified: Secondary | ICD-10-CM

## 2012-12-08 MED ORDER — RIVAROXABAN 20 MG PO TABS: 20.0000 mg | ORAL_TABLET | Freq: Every day | ORAL | Status: DC

## 2012-12-08 NOTE — Telephone Encounter (Signed)
New problem      Pt has a question about her Xarelto and pt thinks it is working very well.    Please call pt.    Thanks!

## 2012-12-08 NOTE — Telephone Encounter (Signed)
Pt was made an app/ refills to get her to app.

## 2013-01-16 ENCOUNTER — Other Ambulatory Visit: Payer: Self-pay | Admitting: Cardiovascular Disease

## 2013-01-27 ENCOUNTER — Ambulatory Visit (INDEPENDENT_AMBULATORY_CARE_PROVIDER_SITE_OTHER): Payer: Medicare Other | Admitting: Cardiovascular Disease

## 2013-01-27 ENCOUNTER — Encounter: Payer: Self-pay | Admitting: Cardiovascular Disease

## 2013-01-27 VITALS — BP 134/80 | HR 74 | Ht 64.0 in | Wt 162.1 lb

## 2013-01-27 DIAGNOSIS — I2510 Atherosclerotic heart disease of native coronary artery without angina pectoris: Secondary | ICD-10-CM

## 2013-01-27 DIAGNOSIS — I10 Essential (primary) hypertension: Secondary | ICD-10-CM

## 2013-01-27 DIAGNOSIS — I4891 Unspecified atrial fibrillation: Secondary | ICD-10-CM

## 2013-01-27 NOTE — Patient Instructions (Signed)
Your physician has recommended you make the following change in your medication: STOP ASPIRIN  Your physician wants you to follow-up in: 1 YEAR You will receive a reminder letter in the mail two months in advance. If you don't receive a letter, please call our office to schedule the follow-up appointment.

## 2013-01-27 NOTE — Assessment & Plan Note (Signed)
[name] is back in atrial fibrillation today. She really cannot tell whether or not she is in atrial fibrillation versus normal sinus rhythm. I suspect that she will he is basically symptomatically she does have some generalized fatigue but I'm not convinced that this is related to her atrial fib.  She does relate that she's having a little bit of blood in her stool. Given that information, we will DC the aspirin and continue the Xarelto.  Continue with her other medications. I'll see her back in the office in one year for followup visit.

## 2013-01-27 NOTE — Assessment & Plan Note (Signed)
Stable

## 2013-01-27 NOTE — Progress Notes (Signed)
[name] Date of Birth  [birth year] [facility]     [place] [facility]  [address]    [address]   [address] [place], [place]  [address]    [place], [place]  [address] [phone]  Fax  [phone]  [phone]  Fax [phone]  Problem List: 1. Small NSTEMI by cardiac enzymes but minimal CAD by cath 2. LVH 3.HTN 4. Hyperlipidemia 5.  Hypokalemia 6. Atrial Fibrillation  History of Present Illness:  Bela is doing fairly well. She is walking and exercising some. She notes that her heart rate is still quite variable. She has a hx of  PAF but typically does not notice if her HR is irregular.  She continues to have fatigue.  We tried cutting her atenolol to 25 mg daily which may have helped.    Dec. 2, 2014:  Shawnita is doing well.   No specific cardiac issues.  Occasionally has some dyspnea.   She is more fatigued than she would like to be.  She has had a sleep study and is being considered for CPAP.  She has had some bleeding in her stool - just occult bleeding.  GI work up was negative.    She cannot tell when she is in A-fib or NSR  Current Outpatient Prescriptions on File Prior to Visit  Medication Sig Dispense Refill  . amLODipine (NORVASC) 5 MG tablet Take 1 tablet (5 mg total) by mouth daily.  30 tablet  3  . aspirin 81 MG tablet Take 81 mg by mouth daily.        [name] atorvastatin (LIPITOR) 20 MG tablet Take 20 mg by mouth daily. Patient states that she has only been taking 10mg  daily  Patient decided to cut dose in half herself      . cholecalciferol (VITAMIN D) 1000 UNITS tablet Take 1,000 Units by mouth daily.        [name] doxazosin (CARDURA) 4 MG tablet TAKE 1 TABLET BY MOUTH EVERY DAY AT BEDTIME  30 tablet  0  . Esomeprazole Magnesium (NEXIUM PO) Take 40 mg by mouth 4 (four) times a week.       . ezetimibe (ZETIA) 10 MG tablet Take 10 mg by mouth daily. Taking 1/2 daily       . KLOR-CON M10 10 MEQ tablet Take 5 mEq by mouth daily.       . Multiple  Vitamin (MULTIVITAMIN) tablet Take 1 tablet by mouth daily.        [name] NITROSTAT 0.4 MG SL tablet DISSOLVE 1 TABLET UNDER THE TONGUE AS NEEDED  25 tablet  3  . Nutritional Supplements (CHOICE DM FIBER-BURST PO) Take by mouth daily.       [name] oxyCODONE-acetaminophen (PERCOCET/ROXICET) 5-325 MG per tablet Take 1-2 tablets by mouth every 6 (six) hours as needed for pain.  30 tablet  0  . Rivaroxaban (XARELTO) 20 MG TABS tablet Take 1 tablet (20 mg total) by mouth daily.  30 tablet  3  . valsartan-hydrochlorothiazide (DIOVAN HCT) 160-12.5 MG per tablet Take 1 tablet by mouth 2 (two) times daily.         No current facility-administered medications on file prior to visit.    Allergies  Allergen Reactions  . Pradaxa [Dabigatran Etexilate Mesylate] Nausea Only    Past Medical History  Diagnosis Date  . MI, acute, non ST segment elevation Dec.7,2011    cath  . Coronary artery disease     minimal  .  Hypertension   . Hyperlipidemia   . Hypokalemia   . Incontinence of urine   . Stromal tumor of the stomach     cancer  . Atrial fibrillation   . Arthritis   . GERD (gastroesophageal reflux disease)   . Chronic kidney disease     urinary incont-  . Hiatal hernia   . Osteopenia   . Bladder polyps   . IBS (irritable bowel syndrome)   . Cervical polyp   . Cataracts, bilateral   . IFG (impaired fasting glucose)     Past Surgical History  Procedure Laterality Date  . Stromal stomach  tumor  03/26/2007    resection  . Ankle fracture surgery  2006    right, plate   . Cardiac catheterization  12/07/20210    Dr.Nahser,minor cad  . Eye surgery      both cataracts  . Colonoscopy    . Upper gi endoscopy    . Knee arthroscopy Right 05/28/2012    Procedure: RIGHT KNEE ARTHROSCOPY PARTIAL MEDIAL AND LATERAL MENISECTOMY WITH CHONDROPLASTY;  Surgeon: Harvie Junior, MD;  Location: [facility];  Service: Orthopedics;  Laterality: Right;    History  Smoking status  . Never Smoker    Smokeless tobacco  . Never Used    History  Alcohol Use  . 1.2 oz/week  . 2 Glasses of wine per week    Comment: one per week    Family History  Problem Relation Age of Onset  . Pancreatic cancer Paternal Grandmother   . Diabetes Paternal Aunt   . Heart disease Paternal Grandfather     both sides of the family    Reviw of Systems:  Reviewed in the HPI.  All other systems are negative.  Physical Exam: Blood pressure 134/80, pulse 74, height 5\' 4"  (1.626 m), weight 162 lb 1.9 oz (73.537 kg). General: Well developed, well nourished, in no acute distress.  Head: Normocephalic, atraumatic, sclera non-icteric, mucus membranes are moist,   Neck: Supple. Negative for carotid bruits. JVD not elevated.  Lungs: Clear bilaterally to auscultation without wheezes, rales, or rhonchi. Breathing is unlabored.  Heart: Irreg. Irreg.  with S1 S2. No murmurs, rubs, or gallops appreciated.  Abdomen: Soft, non-tender, non-distended with normoactive bowel sounds. No hepatomegaly. No rebound/guarding. No obvious abdominal masses.  Msk:  Strength and tone appear normal for age.  Extremities: No clubbing or cyanosis. No edema.  Distal pedal pulses are 2+ and equal bilaterally.  Neuro: Alert and oriented X 3. Moves all extremities spontaneously.  Psych:  Responds to questions appropriately with a normal affect.  ECG: Dec. 2, 2014:  Atrial fib with rate of 74.  No ST or T wave changes.  Assessment / Plan:

## 2013-02-02 ENCOUNTER — Encounter: Payer: Self-pay | Admitting: Neurology

## 2013-02-02 ENCOUNTER — Ambulatory Visit (INDEPENDENT_AMBULATORY_CARE_PROVIDER_SITE_OTHER): Payer: Medicare Other | Admitting: Neurology

## 2013-02-02 VITALS — BP 146/90 | HR 81 | Resp 17 | Ht 65.0 in | Wt 161.0 lb

## 2013-02-02 DIAGNOSIS — G4733 Obstructive sleep apnea (adult) (pediatric): Secondary | ICD-10-CM

## 2013-02-02 NOTE — Patient Instructions (Signed)

## 2013-02-02 NOTE — Progress Notes (Signed)
[facility]  Provider:  Melvyn Novas, M D  Referring Provider: Ezequiel Kayser, MD Primary Care Physician:  Ezequiel Kayser, MD  UNTREATED SEVERE SLEEP APNEA, CLAUSTROPHOBIA.  HPI:  [name] is a 78 y.o. female  Is seen here as a referral/ revisit  from Dr. Waynard Edwards for a sleep visit  , follow up after intended SPLIT night in 12-2011.      Underwent a polysomnographic study on 01-01-12. She had presented with dyspnea on exertion some chest pain memory loss incontinence and nocturia IBS fatigue but not necessarily a high degree of Epworth sleepiness. Dr. Yehuda Budd referred the patient following a normal pulse oximetry which showed hypoxemia. The sleep study showed that the patient indeed had frequent apneas her AHI was 46.8 RDI 51.2 there was milligrams 16 to all events were current in non-REM sleep and for a significantly accelerated during supine sleep with an AHI of 66.2. The patient's oxygen saturation was its lowest at 86% at night. She stayed desaturated was 17.3 minutes which is not a very long time was a conative sleep time. The technician appropriately placed the patient on CPAP at night but 3 different marks were interfaces were tried and could not be tolerated by the patient the patient did awake for over 3 hours and finally would only only go back to sleep without any interface. Intended to send the patient for a desensitization. The patient states today that she never received the results and that a desensitization was therefore not initiated. I explained to her that a CPAP based on a study but could not sure the benefit of CPAP would be would not have been covered in 2013. Important as also that she had mainly hypotony and is sore her apnea is obstructive in nature.  My plan for the patient is  to use  different interfaces at home and attached to a CPAP machine to get used to wearing them. If this can be achieved I would then like for her to be titrated. This could be done  in the sleep lab or up as an  auto titration , the according  machine can be ordered for a month.  The patient describes her sleep time habits as follows she would go to bed between 10 and 11 PM some nights of take 30 minutes some nights is listening to books at night. Pulse would not keep her from falling asleep there is no late in the dense. She does not use a sleep aid at home. She wakes up between 3 and 6 times at night to go to the bathroom. Overall sleep time estimated subjectively at 6 hours. She will rise in the morning between 7:30 and 8 AM, specie break up spontaneously on days where she may have an early appointment she could use a clock radio. She does deny to have any morning headaches, he wakes up with a very dry mouth. She will notice a dry mouth even during her bathroom break. There is no nocturnal diaphoresis, no palpitations that she is aware of.  Do feel a cold during daytime but not at night. She will drink decaffeinated beverages only decaffeinated coffee in the morning decaffeinated tea or sodas during the afternoons. She drinks rarely alcohol, she does not smoke and is not exposed to passive smoke. She has no history of shift working, and her 10 days in retirement allow a regular sleep/wake times. There is no family history of sleep disorders.    medication reviewed.  No recent surgeries or hospitalization. She underwent an arthroscopic right knee surgery in May 2014 .   Review of Systems: Out of a complete 14 system review, the patient complains of only the following symptoms, and all other reviewed systems are negative. Epworth sleepiness score today is and was at 9 points. The patient further and Doris to fatigue I itching, light sensitivity, slight shortness of breath, frequent waking, snoring, urge incontinence cold intolerance, back pain and some memory loss.  History   Social History  . Marital Status: Married    Spouse Name: N/A    Number of Children: 2  . Years  of Education: N/A   Occupational History  . retired    Social History Main Topics  . Smoking status: Never Smoker   . Smokeless tobacco: Never Used  . Alcohol Use: 1.2 oz/week    2 Glasses of wine per week     Comment: one per week  . Drug Use: No  . Sexual Activity: No   Other Topics Concern  . Not on file   Social History Narrative  . No narrative on file    Family History  Problem Relation Age of Onset  . Pancreatic cancer Paternal Grandmother   . Diabetes Paternal Aunt   . Heart disease Paternal Grandfather     both sides of the family    Past Medical History  Diagnosis Date  . MI, acute, non ST segment elevation Dec.7,2011    cath  . Coronary artery disease     minimal  . Hypertension   . Hyperlipidemia   . Hypokalemia   . Incontinence of urine   . Stromal tumor of the stomach     cancer  . Atrial fibrillation   . Arthritis   . GERD (gastroesophageal reflux disease)   . Chronic kidney disease     urinary incont-  . Hiatal hernia   . Osteopenia   . Bladder polyps   . IBS (irritable bowel syndrome)   . Cervical polyp   . Cataracts, bilateral   . IFG (impaired fasting glucose)   . OSA (obstructive sleep apnea)     hypoxia , not CO 2 retainer,     Past Surgical History  Procedure Laterality Date  . Stromal stomach  tumor  03/26/2007    resection  . Ankle fracture surgery  2006    right, plate   . Cardiac catheterization  12/07/20210    Dr.Nahser,minor cad  . Eye surgery      both cataracts  . Colonoscopy    . Upper gi endoscopy    . Knee arthroscopy Right 05/28/2012    Procedure: RIGHT KNEE ARTHROSCOPY PARTIAL MEDIAL AND LATERAL MENISECTOMY WITH CHONDROPLASTY;  Surgeon: Harvie Junior, MD;  Location: [facility];  Service: Orthopedics;  Laterality: Right;    Current Outpatient Prescriptions  Medication Sig Dispense Refill  . alendronate (FOSAMAX) 70 MG tablet       . amLODipine (NORVASC) 5 MG tablet Take 1 tablet (5 mg total) by  mouth daily.  30 tablet  3  . atenolol (TENORMIN) 25 MG tablet Take 25 mg by mouth daily.      [name] atorvastatin (LIPITOR) 20 MG tablet Take 20 mg by mouth daily. Patient states that she has only been taking 10mg  daily  Patient decided to cut dose in half herself      . cholecalciferol (VITAMIN D) 1000 UNITS tablet Take 1,000 Units by mouth daily.        [name]  doxazosin (CARDURA) 4 MG tablet TAKE 1 TABLET BY MOUTH EVERY DAY AT BEDTIME  30 tablet  0  . Esomeprazole Magnesium (NEXIUM PO) Take 40 mg by mouth 4 (four) times a week.       . ezetimibe (ZETIA) 10 MG tablet Take 10 mg by mouth daily. Taking 1/2 daily       . KLOR-CON M10 10 MEQ tablet Take 5 mEq by mouth daily.       . Multiple Vitamin (MULTIVITAMIN) tablet Take 1 tablet by mouth daily.        [name] NITROSTAT 0.4 MG SL tablet DISSOLVE 1 TABLET UNDER THE TONGUE AS NEEDED  25 tablet  3  . Nutritional Supplements (CHOICE DM FIBER-BURST PO) Take by mouth daily.       [name] oxyCODONE-acetaminophen (PERCOCET/ROXICET) 5-325 MG per tablet Take 1-2 tablets by mouth every 6 (six) hours as needed for pain.  30 tablet  0  . Rivaroxaban (XARELTO) 20 MG TABS tablet Take 1 tablet (20 mg total) by mouth daily.  30 tablet  3  . valsartan-hydrochlorothiazide (DIOVAN HCT) 160-12.5 MG per tablet Take 1 tablet by mouth 2 (two) times daily.         No current facility-administered medications for this visit.    Allergies as of 02/02/2013 - Review Complete 02/02/2013  Allergen Reaction Noted  . Pradaxa [dabigatran etexilate mesylate] Nausea Only 10/30/2011    Vitals: BP 146/90  Pulse 81  Resp 17  Ht 5\' 5"  (1.651 m)  Wt 161 lb (73.029 kg)  BMI 26.79 kg/m2 Last Weight:  Wt Readings from Last 1 Encounters:  02/02/13 161 lb (73.029 kg)   Last Height:   Ht Readings from Last 1 Encounters:  02/02/13 5\' 5"  (1.651 m)    Physical exam:  General: The patient is awake, alert and appears not in acute distress. The patient is well groomed. Head: Normocephalic,  atraumatic. Neck is supple. Mallampati 2 , neck circumference: Cardiovascular:  Regular rate and rhythm, without  murmurs or carotid bruit, and without distended neck veins. Respiratory: Lungs are clear to auscultation. Skin:  Without evidence of edema, or rash Trunk: BMI is  elevated and patient  has normal posture.  Neurologic exam : The patient is awake and alert, oriented to place and time.  Memory subjective described as impaired for short term memory , recall .  There is a normal attention span & concentration ability. Speech is fluent without  dysarthria, dysphonia or aphasia.  Mood and affect are appropriate.  Cranial nerves: Pupils are equal and briskly reactive to light. Funduscopic exam without evidence of pallor or edema. Extraocular movements in vertical and horizontal planes with nystagmus. Patient has a history of diplopia- corrected by  glasses, r esotropia.  Horizontal diplopia.   Visual fields by finger perimetry are intact. Hearing to finger rub intact.   Facial sensation intact to fine touch. Facial motor strength is symmetric and tongue and uvula move midline.mallompoti 3-4 , large uvula . TMJ- clicking .   Motor exam:  * tone and muscle bulk are symmetric , normal strength in all extremities.  Sensory:  Fine touch, pinprick and vibration were tested in all extremities. Proprioception is  normal.  Coordination: Rapid alternating movements in the fingers/hands is tested and normal.  Finger-to-nose maneuver tested and normal without evidence of ataxia, dysmetria or tremor.  Gait and station: Patient walks without assistive device . Strength within normal limits. Stance is stable and normal. Steps are unfragmented. Romberg testing is  normal.  Deep tendon reflexes: in the  upper and lower extremities are symmetric and intact. Babinski maneuver  downgoing.   Assessment:  After physical and neurologic examination, review of laboratory studies, imaging, neurophysiology  testing and pre-existing records, assessment is   1) OSA confirmed in PSG 12-2011, hypoxia by ONO before confirmed. Claustrophobia.   Will send for densensitization to interface , she will take a nasal pillow with her to use at home, unattached to CPAP, nasal P10 preferred .  after 14 days visit with [facility] and arrange for titration ether in lab or with auto-set, depending on insurance Coverage.

## 2013-02-04 ENCOUNTER — Telehealth: Payer: Self-pay | Admitting: *Deleted

## 2013-02-04 NOTE — Telephone Encounter (Signed)
Message copied by Daryll Drown on Wed Feb 04, 2013  1:11 PM ------      Message from: Janey Greaser      Created: Mon Feb 02, 2013 12:36 PM       Hi, Dr.Dohmeier wants this patient to have a P10 mask between small and medium.  Please call patient.                        Thanks            Jasmine December ------

## 2013-02-04 NOTE — Telephone Encounter (Signed)
Called and LM for patient, explained the situation of Dr. Vickey Huger wanting her to try one mask in particular.  Asked her to call me to arrange a time that we could fit it for her and allow her to take it home and use it for a trial period.

## 2013-02-11 ENCOUNTER — Telehealth: Payer: Self-pay | Admitting: *Deleted

## 2013-02-11 DIAGNOSIS — G4733 Obstructive sleep apnea (adult) (pediatric): Secondary | ICD-10-CM

## 2013-02-11 DIAGNOSIS — F419 Anxiety disorder, unspecified: Secondary | ICD-10-CM

## 2013-02-11 NOTE — Telephone Encounter (Signed)
Tried patient at home and left a detailed message.  Tried Counselling psychologist hadn't been setup yet.  Asked pt to call me or come in tomorrow between 7 am and 2 pm and I could fit her for a mask.  Informed her of time off for the holidays so she was aware of my schedule and when I would return if she can't come in tomorrow. -sh

## 2013-02-11 NOTE — Telephone Encounter (Signed)
Message copied by Daryll Drown on Wed Feb 11, 2013  4:30 PM ------      Message from: [facility]      Created: Wed Feb 11, 2013  3:09 PM      Regarding: Titus Mould, please return call to pt.  She was out of town when you called the last time.  Either today, or tomorrow before 10am or after 3pm. ------

## 2013-02-12 ENCOUNTER — Encounter: Payer: Self-pay | Admitting: *Deleted

## 2013-02-12 MED ORDER — ZOLPIDEM TARTRATE ER 6.25 MG PO TBCR: 6.2500 mg | EXTENDED_RELEASE_TABLET | Freq: Every evening | ORAL | Status: DC | PRN

## 2013-02-12 NOTE — Telephone Encounter (Signed)
Met with patient today and fit her for a P10 mask size XS and SM were given.  The message I received stated that she needed to try this mask, I didn't realize at the time that she did not yet have a CPAP to try it with.  So, during our conversation, I explained that we would just need to forward orders for CPAP for her and she could try with this mask for a trial period to see how she does.   I went back to put the orders in and her study was done in November 2013 and she will be required by her insurance company to have new testing done in order to receive CPAP equipment.  This will be in her best interests however because she experienced great difficulty with CPAP therapy in 2013 and we now have to opportunity to help her in the sleep lab to desensitize knowing of her previous experience.  Dr. Vickey Huger also recommended the use of a sleep aid during the study which was not used previously, perhaps she would order one for her to use as needed for the return visit.  I called and LM for patient relaying this information to her and explaining that a CPAP Titration would be necessary and was an insurance requirement that testing had to be obtained 6 months prior to starting CPAP therapy.  She wanted to wait until after Christmas to get a CPAP due to a trip she is taking.  I expressed that we can schedule her study in January at her convenience and that once the study was done, getting the CPAP ordered wouldn't be an issue.  She will likely have to have a Split Night study done and obtain new diagnostic information for Medicare, but a thorough desensitization should be tried prior to bedtime.  She may be a good one on one patient on a Sunday in order to give her plenty of time and care prior to setup. -sh  Orders have been entered for Split Night study for this patient.

## 2013-02-12 NOTE — Addendum Note (Signed)
Addended by: Bonita Quin B on: 02/12/2013 10:08 AM   Modules accepted: Orders

## 2013-02-12 NOTE — Addendum Note (Signed)
Addended by: Melvyn Novas on: 02/12/2013 10:55 AM   Modules accepted: Orders

## 2013-02-13 ENCOUNTER — Telehealth: Payer: Self-pay

## 2013-02-13 NOTE — Telephone Encounter (Signed)
Rx faxed Ambien CR

## 2013-03-02 ENCOUNTER — Telehealth: Payer: Self-pay | Admitting: *Deleted

## 2013-03-02 NOTE — Telephone Encounter (Signed)
Message copied by Cathi Roan on Mon Mar 02, 2013  1:49 PM ------      Message from: Dory Horn      Created: Mon Mar 02, 2013  8:50 AM      Regarding: Pt returning your call       Please return call to [phone] ------

## 2013-03-02 NOTE — Telephone Encounter (Signed)
Spoke to patient and scheduled her sleep study for Sunday 11th at 9:30 PM on my schedule.  She did not have a good experience previously and was reluctant to schedule.  She felt better once we put her on a Sunday with me.  She will bring the mask I gave her and come prepared.  I will contact insurance company and send pt a packet Tuesday 03/03/12. -sh

## 2013-03-08 ENCOUNTER — Ambulatory Visit (INDEPENDENT_AMBULATORY_CARE_PROVIDER_SITE_OTHER): Payer: Medicare Other | Admitting: Neurology

## 2013-03-08 DIAGNOSIS — G4733 Obstructive sleep apnea (adult) (pediatric): Secondary | ICD-10-CM

## 2013-03-08 DIAGNOSIS — G4761 Periodic limb movement disorder: Secondary | ICD-10-CM

## 2013-03-09 NOTE — Sleep Study (Signed)
See media tab for full report  

## 2013-03-17 ENCOUNTER — Other Ambulatory Visit: Payer: Self-pay | Admitting: Cardiovascular Disease

## 2013-03-19 ENCOUNTER — Telehealth: Payer: Self-pay | Admitting: Neurology

## 2013-03-19 DIAGNOSIS — G4733 Obstructive sleep apnea (adult) (pediatric): Secondary | ICD-10-CM

## 2013-03-19 NOTE — Telephone Encounter (Signed)
I called and left  a message for the patient about her recent sleep study results. I informed the patient that the study confirmed the diagnosis of severe obstructive sleep apnea and Dr. Brett Fairy recommend CPAP therapy. I will send the order to [facility] who will contact her once they receive benefits for the CPAP. I will fax a copy of the report to Dr. Silvestre Mesi office and mail a copy of the report along with a follow instruction letter to the patient.

## 2013-03-20 ENCOUNTER — Other Ambulatory Visit: Payer: Self-pay

## 2013-03-20 MED ORDER — AMLODIPINE BESYLATE 5 MG PO TABS: 5.0000 mg | ORAL_TABLET | Freq: Every day | ORAL | Status: DC

## 2013-03-23 ENCOUNTER — Encounter: Payer: Self-pay | Admitting: *Deleted

## 2013-04-02 ENCOUNTER — Telehealth: Payer: Self-pay | Admitting: Cardiovascular Disease

## 2013-04-02 NOTE — Telephone Encounter (Signed)
Walk In pt Form "[employer]" letter Dropped Off gave to [facility]

## 2013-04-06 ENCOUNTER — Telehealth: Payer: Self-pay | Admitting: *Deleted

## 2013-04-06 MED ORDER — HYDROCHLOROTHIAZIDE 12.5 MG PO CAPS: 12.5000 mg | ORAL_CAPSULE | Freq: Two times a day (BID) | ORAL | Status: DC

## 2013-04-06 MED ORDER — VALSARTAN 160 MG PO TABS: 160.0000 mg | ORAL_TABLET | Freq: Two times a day (BID) | ORAL | Status: DC

## 2013-04-06 NOTE — Telephone Encounter (Signed)
Pt dropped off [employer] letter stating they will not pay for valsart/hctz 160-12.5,  I will send as separate meds, to see if it can be covered. I left a msg for the pt to update her.

## 2013-04-17 ENCOUNTER — Other Ambulatory Visit: Payer: Self-pay

## 2013-04-17 DIAGNOSIS — I4891 Unspecified atrial fibrillation: Secondary | ICD-10-CM

## 2013-04-17 DIAGNOSIS — E785 Hyperlipidemia, unspecified: Secondary | ICD-10-CM

## 2013-04-17 DIAGNOSIS — I10 Essential (primary) hypertension: Secondary | ICD-10-CM

## 2013-04-17 MED ORDER — RIVAROXABAN 20 MG PO TABS: 20.0000 mg | ORAL_TABLET | Freq: Every day | ORAL | Status: DC

## 2013-05-04 ENCOUNTER — Telehealth: Payer: Self-pay | Admitting: *Deleted

## 2013-05-04 NOTE — Telephone Encounter (Signed)
PA to [employer] for [employer]

## 2013-05-06 NOTE — Telephone Encounter (Signed)
Error from previous note med is xarelto not pradaxa  Xarelto approved by [employer]  through 05/05/2014, PA # [id number]

## 2013-05-19 ENCOUNTER — Encounter: Payer: Self-pay | Admitting: Neurology

## 2013-06-16 ENCOUNTER — Ambulatory Visit (INDEPENDENT_AMBULATORY_CARE_PROVIDER_SITE_OTHER): Payer: Medicare Other | Admitting: Neurology

## 2013-06-16 ENCOUNTER — Encounter (INDEPENDENT_AMBULATORY_CARE_PROVIDER_SITE_OTHER): Payer: Self-pay

## 2013-06-16 ENCOUNTER — Encounter: Payer: Self-pay | Admitting: Neurology

## 2013-06-16 VITALS — BP 132/80 | HR 84 | Resp 16 | Ht 65.0 in | Wt 165.0 lb

## 2013-06-16 DIAGNOSIS — Z9989 Dependence on other enabling machines and devices: Principal | ICD-10-CM

## 2013-06-16 DIAGNOSIS — G4733 Obstructive sleep apnea (adult) (pediatric): Secondary | ICD-10-CM

## 2013-06-16 HISTORY — DX: Obstructive sleep apnea (adult) (pediatric): G47.33

## 2013-06-16 NOTE — Progress Notes (Signed)
[facility]  Provider:  Larey Seat, M D  Referring Provider: Jerlyn Ly, MD Primary Care Physician:  Jerlyn Ly, MD   SEVERE SLEEP APNEA, CLAUSTROPHOBIA.  HPI:  [name] is a 79 y.o. female  Is seen here as a  revisit  from Dr. Joylene Draft for a sleep visit , follow up after intended SPLIT night in 03-08-13, with [facility] ,    Last visit note : Underwent a polysomnographic study on 01-01-12. She had presented with dyspnea on exertion some chest pain memory loss incontinence and nocturia IBS fatigue but not necessarily a high degree of Epworth sleepiness. Dr. Lawanda Cousins referred the patient following a normal pulse oximetry which showed hypoxemia. The sleep study showed that the patient indeed had frequent apneas her AHI was 46.8 RDI 51.2 there was milligrams 16 to all events were current in non-REM sleep and for a significantly accelerated during supine sleep with an AHI of 66.2. The patient's oxygen saturation was its lowest at 86% at night. She stayed desaturated was 17.3 minutes which is not a very long time was a conative sleep time. The technician appropriately placed the patient on CPAP at night but 3 different marks were interfaces were tried and could not be tolerated by the patient the patient did awake for over 3 hours and finally would only only go back to sleep without any interface. Intended to send the patient for a desensitization. The patient states today that she never received the results and that a desensitization was therefore not initiated. I explained to her that a CPAP based on a study but could not sure the benefit of CPAP would be would not have been covered in 2013. Important as also that she had mainly hypotony and is sore her apnea is obstructive in nature.  My plan for the patient is  to use  different interfaces at home and attached to a CPAP machine to get used to wearing them. If this can be achieved I would then like for her to be titrated.  This could be done in the sleep lab or up as an  auto titration , the according  machine can be ordered for a month.  The patient describes her sleep time habits as follows she would go to bed between 10 and 11 PM some nights of take 30 minutes some nights is listening to books at night. Pulse would not keep her from falling asleep there is no late in the dense. She does not use a sleep aid at home. She wakes up between 3 and 6 times at night to go to the bathroom. Overall sleep time estimated subjectively at 6 hours. She will rise in the morning between 7:30 and 8 AM, specie break up spontaneously on days where she may have an early appointment she could use a clock radio. She does deny to have any morning headaches, he wakes up with a very dry mouth. She will notice a dry mouth even during her bathroom break. There is no nocturnal diaphoresis, no palpitations that she is aware of.  Do feel a cold during daytime but not at night. She will drink decaffeinated beverages only decaffeinated coffee in the morning decaffeinated tea or sodas during the afternoons. She drinks rarely alcohol, she does not smoke and is not exposed to passive smoke. She has no history of shift working, and her 10 days in retirement allow a regular sleep/wake times. There is no family history of sleep disorders.  Today, on 06-16-13, [name] is here for a revisit. In the meantime she has undergone annual split night polysomnography. The date of the study was 03-08-13. Her Epworth sleepiness score was endorsed at 11 points, she tested again positive for sleep apnea with an AHI of 61.7 and an RDI of 69.7. The lowest oxygen saturation was 85% with a total of 27.9 minutes of desaturation. She had fewer periodic limb movements with arousal index of 4.6 per hour she also had atrial fibrillation with irregular heart beat is green she was attached to the original study. She was titrated to 9 cm water with 2 cm EPR and has done very well  at the setting.  Today's download is reviewed for a period of 30 days, her compliance is 97%. She missed one single day of CPAP use 2 to a rhinitis and nasal congestion.  The residual AHI is 2.0, she has no central apneas, the setting of still 9 cm water. She uses her CPAP for 7 hours and 27 minutes nightly on average.  The machine is quiet and she has just started to travel with it.  The mask is comfortable and leaves no pressure marks.  All medication reviewed.  No recent surgeries or hospitalization. She underwent an arthroscopic right knee surgery in May 2014 .   Review of Systems: Out of a complete 14 system review, the patient complains of only the following symptoms, and all other reviewed systems are negative. , light sensitivity, slight shortness of breath, frequent waking,  urge incontinence , cold intolerance, back pain and some memory loss.  Epworth 8, FSS 40 points, GDS  2.5 points.   History   Social History  . Marital Status: Married    Spouse Name: [name]    Number of Children: 2  . Years of Education: Masters   Occupational History  . retired    Social History Main Topics  . Smoking status: Never Smoker   . Smokeless tobacco: Never Used  . Alcohol Use: 1.2 oz/week    2 Glasses of wine per week     Comment: one per week  . Drug Use: No  . Sexual Activity: No   Other Topics Concern  . Not on file   Social History Narrative   Patient is married [name]) and lives at home with her husband.   Patient is retired.   Patient has a Oceanographer in Education   Patient is right-handed.   Patient does not drink any caffeine.   Patient has one living child and one child is deceased.    Family History  Problem Relation Age of Onset  . Pancreatic cancer Paternal Grandmother   . Diabetes Paternal Aunt   . Heart disease Paternal Grandfather     both sides of the family    Past Medical History  Diagnosis Date  . MI, acute, non ST segment elevation Dec.7,2011     cath  . Coronary artery disease     minimal  . Hypertension   . Hyperlipidemia   . Hypokalemia   . Incontinence of urine   . Stromal tumor of the stomach     cancer  . Atrial fibrillation   . Arthritis   . GERD (gastroesophageal reflux disease)   . Chronic kidney disease     urinary incont-  . Hiatal hernia   . Osteopenia   . Bladder polyps   . IBS (irritable bowel syndrome)   . Cervical polyp   . Cataracts, bilateral   .  IFG (impaired fasting glucose)   . OSA (obstructive sleep apnea)     hypoxia , not CO 2 retainer,   . OSA on CPAP 06/16/2013     Sleep study 1- 11-15 with an AHI of 61 , now titrated to CPAP 9 cm water with 2 cm EPR.     Past Surgical History  Procedure Laterality Date  . Stromal stomach  tumor  03/26/2007    resection  . Ankle fracture surgery  2006    right, plate   . Cardiac catheterization  12/07/20210    Dr.Nahser,minor cad  . Eye surgery      both cataracts  . Colonoscopy    . Upper gi endoscopy    . Knee arthroscopy Right 05/28/2012    Procedure: RIGHT KNEE ARTHROSCOPY PARTIAL MEDIAL AND LATERAL MENISECTOMY WITH CHONDROPLASTY;  Surgeon: Alta Corning, MD;  Location: [facility];  Service: Orthopedics;  Laterality: Right;    Current Outpatient Prescriptions  Medication Sig Dispense Refill  . alendronate (FOSAMAX) 70 MG tablet       . amLODipine (NORVASC) 5 MG tablet Take 1 tablet (5 mg total) by mouth daily.  30 tablet  6  . atenolol (TENORMIN) 25 MG tablet Take 25 mg by mouth daily.      [name] atorvastatin (LIPITOR) 20 MG tablet Take 20 mg by mouth daily. Patient states that she has only been taking 10mg  daily  Patient decided to cut dose in half herself      . cholecalciferol (VITAMIN D) 1000 UNITS tablet Take 1,000 Units by mouth daily.        [name] doxazosin (CARDURA) 4 MG tablet TAKE 1 TABLET BY MOUTH EVERY DAY AT BEDTIME  30 tablet  5  . Esomeprazole Magnesium (NEXIUM PO) Take 40 mg by mouth 4 (four) times a week.       . ezetimibe  (ZETIA) 10 MG tablet Take 10 mg by mouth daily. Taking 1/2 daily       . hydrochlorothiazide (MICROZIDE) 12.5 MG capsule Take 1 capsule (12.5 mg total) by mouth 2 (two) times daily.  180 capsule  3  . KLOR-CON M10 10 MEQ tablet Take 5 mEq by mouth daily.       . Multiple Vitamin (MULTIVITAMIN) tablet Take 1 tablet by mouth daily.        [name] NITROSTAT 0.4 MG SL tablet DISSOLVE 1 TABLET UNDER THE TONGUE AS NEEDED  25 tablet  3  . Nutritional Supplements (CHOICE DM FIBER-BURST PO) Take by mouth daily.       . Omega 3 1200 MG CAPS Take 1 capsule by mouth daily.      . Rivaroxaban (XARELTO) 20 MG TABS tablet Take 1 tablet (20 mg total) by mouth daily.  30 tablet  6  . valsartan (DIOVAN) 160 MG tablet Take 1 tablet (160 mg total) by mouth 2 (two) times daily.  180 tablet  3  . zolpidem (AMBIEN CR) 6.25 MG CR tablet Take 1 tablet (6.25 mg total) by mouth at bedtime as needed for sleep.  30 tablet  0   No current facility-administered medications for this visit.    Allergies as of 06/16/2013 - Review Complete 06/16/2013  Allergen Reaction Noted  . Pradaxa [dabigatran etexilate mesylate] Nausea Only 10/30/2011    Vitals: BP 132/80  Pulse 84  Resp 16  Ht 5\' 5"  (1.651 m)  Wt 165 lb (74.844 kg)  BMI 27.46 kg/m2 Last Weight:  Wt Readings from Last 1 Encounters:  06/16/13 165 lb (74.844 kg)   Last Height:   Ht Readings from Last 1 Encounters:  06/16/13 5\' 5"  (1.651 m)    Physical exam:  General: The patient is awake, alert and appears not in acute distress. The patient is well groomed. Head: Normocephalic, atraumatic. Neck is supple. Mallampati 3 , neck circumference: 15.5. No nasal obstruction.   Cardiovascular:  Regular rate and rhythm, without  murmurs or carotid bruit, and without distended neck veins. Respiratory: Lungs are clear to auscultation. Skin:  Without evidence of  Rash, right ankle swelling.  Trunk: BMI is  elevated and patient  has normal posture.  Neurologic exam : The  patient is awake and alert, oriented to place and time.  Memory subjective described as improved with better sleep.   There is a normal attention span & concentration ability.  Speech is fluent without  dysarthria, dysphonia or aphasia.  Mood and affect are appropriate.  Cranial nerves: Pupils are equal and briskly reactive to light. Funduscopic exam without evidence of pallor or edema.  Extraocular movements in vertical and horizontal planes with nystagmus. Patient has a history of diplopia- corrected by  glasses,  esotropia.  Horizontal diplopia.   Visual fields by finger perimetry are intact. Hearing to finger rub intact.   Facial sensation intact to fine touch. Facial motor strength is symmetric and tongue and uvula move midline.mallompoti 3-4 , large uvula . TMJ- clicking .   Motor exam: Muscle tone and muscle bulk are symmetric ,  She has crepitation over both knees and the right ankle . Sensory:  Fine touch, pinprick and vibration were tested in all extremities. Proprioception is  normal.  Coordination: Rapid alternating movements in the fingers/hands is tested and normal.  Finger-to-nose maneuver tested and normal without evidence of ataxia, dysmetria or tremor.  Gait and station: Patient walks without assistive device . Strength within normal limits.  Stance is stable and normal. Steps are unfragmented. Romberg testing is  normal.  Deep tendon reflexes: in the  upper and lower extremities are symmetric and intact. Babinski maneuver  downgoing.   Assessment:  After physical and neurologic examination, review of laboratory studies, imaging, neurophysiology testing and pre-existing records, assessment is   1) OSA confirmed in PSG 03-08-13 , AHi 61 , RDI 67, titrated ot 9 cm water CPAP, now excellent compliance.  hypoxia by ONO . She feels that she sleeps better, longer, deeper and has no nocturia, but she likes to be evaluated for memory loss in the next visit.    RV in 6 month with  NP for compliance , CPAP and MMSE /MOCA .

## 2013-07-23 ENCOUNTER — Other Ambulatory Visit: Payer: Self-pay | Admitting: *Deleted

## 2013-07-23 MED ORDER — NITROGLYCERIN 0.4 MG SL SUBL: SUBLINGUAL_TABLET | SUBLINGUAL | Status: DC

## 2013-07-27 ENCOUNTER — Encounter (HOSPITAL_COMMUNITY): Payer: Self-pay

## 2013-07-28 ENCOUNTER — Encounter (HOSPITAL_COMMUNITY)
Admission: RE | Admit: 2013-07-28 | Discharge: 2013-07-28 | Disposition: A | Discharge status: Home / Self Care | Payer: Medicare Other | Source: Ambulatory Visit | Attending: Orthopedic Surgery | Admitting: Orthopedic Surgery

## 2013-07-28 ENCOUNTER — Telehealth: Payer: Self-pay | Admitting: Internal Medicine

## 2013-07-28 ENCOUNTER — Other Ambulatory Visit (HOSPITAL_COMMUNITY): Payer: Medicare Other

## 2013-07-28 ENCOUNTER — Encounter (HOSPITAL_COMMUNITY): Payer: Self-pay

## 2013-07-28 ENCOUNTER — Other Ambulatory Visit: Payer: Self-pay | Admitting: Orthopedic Surgery

## 2013-07-28 DIAGNOSIS — Z01812 Encounter for preprocedural laboratory examination: Secondary | ICD-10-CM | POA: Insufficient documentation

## 2013-07-28 DIAGNOSIS — Z01818 Encounter for other preprocedural examination: Secondary | ICD-10-CM | POA: Insufficient documentation

## 2013-07-28 DIAGNOSIS — J438 Other emphysema: Secondary | ICD-10-CM | POA: Insufficient documentation

## 2013-07-28 DIAGNOSIS — I10 Essential (primary) hypertension: Secondary | ICD-10-CM | POA: Insufficient documentation

## 2013-07-28 HISTORY — DX: Malignant (primary) neoplasm, unspecified: C80.1

## 2013-07-28 HISTORY — DX: Cardiac arrhythmia, unspecified: I49.9

## 2013-07-28 LAB — BASIC METABOLIC PANEL
BUN: 16 mg/dL (ref 6–23)
CHLORIDE: 98 meq/L (ref 96–112)
CO2: 29 mEq/L (ref 19–32)
Calcium: 10 mg/dL (ref 8.4–10.5)
Creatinine, Ser: 0.78 mg/dL (ref 0.50–1.10)
GFR calc non Af Amer: 79 mL/min — ABNORMAL LOW (ref >90)
GLUCOSE: 86 mg/dL (ref 70–99)
Potassium: 3.2 mEq/L — ABNORMAL LOW (ref 3.7–5.3)
SODIUM: 138 meq/L (ref 137–147)

## 2013-07-28 LAB — CBC
HCT: 37.7 % (ref 36.0–46.0)
HEMOGLOBIN: 12.6 g/dL (ref 12.0–15.0)
MCH: 28.7 pg (ref 26.0–34.0)
MCHC: 33.4 g/dL (ref 30.0–36.0)
MCV: 85.9 fL (ref 78.0–100.0)
Platelets: 199 10*3/uL (ref 150–400)
RBC: 4.39 MIL/uL (ref 3.87–5.11)
RDW: 13.5 % (ref 11.5–15.5)
WBC: 5.3 10*3/uL (ref 4.0–10.5)

## 2013-07-28 LAB — PROTIME-INR
INR: 1.49 (ref 0.00–1.49)
Prothrombin Time: 17.6 seconds — ABNORMAL HIGH (ref 11.6–15.2)

## 2013-07-28 LAB — SURGICAL PCR SCREEN
MRSA, PCR: NEGATIVE
Staphylococcus aureus: NEGATIVE

## 2013-07-28 LAB — APTT: aPTT: 41 seconds — ABNORMAL HIGH (ref 24–37)

## 2013-07-28 NOTE — Telephone Encounter (Signed)
lmtcb

## 2013-07-28 NOTE — Pre-Procedure Instructions (Signed)
[name]  07/28/2013   Your procedure is scheduled on:  08/07/13  Report to [facility] Admitting at 845 AM.  Call this number if you have problems the morning of surgery: [phone]   Remember:   Do not eat food or drink liquids after midnight.   Take these medicines the morning of surgery with A SIP OF WATER: norvasc,atenolol,cardura,nexium   Do not wear jewelry, make-up or nail polish.  Do not wear lotions, powders, or perfumes. You may wear deodorant.  Do not shave 48 hours prior to surgery. Men may shave face and neck.  Do not bring valuables to the hospital.  [facility] is not responsible                  for any belongings or valuables.               Contacts, dentures or bridgework may not be worn into surgery.  Leave suitcase in the car. After surgery it may be brought to your room.  For patients admitted to the hospital, discharge time is determined by your                treatment team.               Patients discharged the day of surgery will not be allowed to drive  home.  Name and phone number of your driver: family  Special Instructions: Shower using CHG 2 nights before surgery and the night before surgery.  If you shower the day of surgery use CHG.  Use special wash - you have one bottle of CHG for all showers.  You should use approximately 1/3 of the bottle for each shower.   Please read over the following fact sheets that you were given: Pain Booklet, Coughing and Deep Breathing, Blood Transfusion Information, MRSA Information and Surgical Site Infection Prevention

## 2013-07-28 NOTE — Telephone Encounter (Signed)
New problem    Pt called to report she is scheduled to have a knee  Replacement 6/12 Dr Berenice Primas.  Pt needs a call back on what she should not do medication.

## 2013-07-29 ENCOUNTER — Encounter (HOSPITAL_COMMUNITY): Payer: Self-pay

## 2013-07-29 ENCOUNTER — Encounter (HOSPITAL_COMMUNITY): Payer: Medicare Other | Admitting: Vascular Surgery

## 2013-07-29 ENCOUNTER — Telehealth: Payer: Self-pay | Admitting: Cardiovascular Disease

## 2013-07-29 ENCOUNTER — Inpatient Hospital Stay (HOSPITAL_COMMUNITY): Payer: Medicare Other | Admitting: Anesthesiology

## 2013-07-29 NOTE — Telephone Encounter (Signed)
Patient has questions regarding medication. She is having a knee replacement 08/07/13. Please call and advise.

## 2013-07-29 NOTE — Telephone Encounter (Signed)
Spoke with patient and reviewed Dr. Elmarie Shiley instructions regarding Xarelto in relation to knee surgery.  I advised patient that I have faxed a copy to Dr. Berenice Primas office via epic and advised her to call back if anything else is needed.  Patient thanked me for the call.

## 2013-07-29 NOTE — Telephone Encounter (Signed)
I received a separate telephone message from patient requesting instructions regarding Xarelto for orthopedic surgery and this issue has been resolved

## 2013-07-29 NOTE — Telephone Encounter (Signed)
Patient may hold xarelto for 2 days prior to ortho surgery.  Restart when OK with Ortho - presumably the day after surgery.

## 2013-07-29 NOTE — Telephone Encounter (Signed)
Spoke with patient who is scheduled for knee replacement 6/12 and was asked at her preop visit to follow up with Dr. Acie Fredrickson about taking Xarelto pre and post-operatively.  I advised patient that I will route message to Dr. Acie Fredrickson for advice and we will fax his note to Dr. Berenice Primas at [facility] per patient's request.  Patient also requests a call back when this is complete.

## 2013-07-29 NOTE — Progress Notes (Addendum)
Anesthesia Chart Review:  Patient is a 79 year old female scheduled for right TKA on 08/07/13 by Dr. Berenice Primas.  History includes non-smoker, NSTEMI 01/2009 with minimal CAD by cath, HTN, HLD, afib, GERD, arthritis, hiatal hernia, CKD, IBS, OSA with CPAP, stromal gastric tumor resection, impaired fasting glucose. Dr. Acie Fredrickson is her cardiologist and instructed her to hold Xarelto 2 days prior to her surgery. PCP is listed as Dr. Crist Infante.  EKG on 01/27/13 showed: Afib at 74 bpm, RSR prime or QR pattern in V1 suggests RV conduction delay.  Echo on 12/27/10 showed: - Left ventricle: The cavity size was normal. Wall thickness was increased in a pattern of mild LVH. Systolic function was normal. The estimated ejection fraction was in the range of 55% to 60%. Wall motion was normal; there were no regional wall motion abnormalities. Features are consistent with a pseudonormal left ventricular filling pattern, with concomitant abnormal relaxation and increased filling pressure (grade 2 diastolic dysfunction). - Aortic valve: Trivial regurgitation. - Mitral valve: Moderately calcified annulus. - Left atrium: The atrium was mildly dilated.  Cardiac cath on 02/01/09 showed: NL LM, 10-20% proximal LAD, mild irregularities proximal LCX and RCA. Normal LVF, EF 65%.  CXR on 07/28/13 showed: The underlying emphysematous change. No edema or consolidation. Heart is mildly prominent but stable.  Preoperative labs noted. Repeat PT/PTT on arrival.  Orders were pending at the time of her PAT visit, so additional surgeon orders will be done on the day of surgery as well.  If no acute changes then I would anticipate that she could proceed as planned.  George Hugh [facility] Short Stay Center/Anesthesiology Phone [phone] 07/29/2013 4:21 PM  Addendum: 08/04/2013 4:55 PM Received a call from Manuela Schwartz at Dr. Berenice Primas office.  Patient called requesting spinal anesthesia.  Patient is on Xarelto for afib, but by notes was  told to hold 2 days prior to surgery. I reviewed above with anesthesiologist Dr. Ermalene Postin. She will be getting a repeat PT/PTT on arrival, so spinal anesthesia may be contraindicated based on those results. If those results are WNL, then Dr. Ermalene Postin felt spinal anesthesia could at least be considered, but would ultimately defer decision to her assigned anesthesiologist who may decide that GA is the safer option due to her history of Xarelto use. I left a voice message with Manuela Schwartz relaying this.

## 2013-08-04 NOTE — Anesthesia Preprocedure Evaluation (Addendum)
Anesthesia Evaluation    Airway       Dental   Pulmonary          Cardiovascular hypertension,     Neuro/Psych    GI/Hepatic   Endo/Other    Renal/GU      Musculoskeletal   Abdominal   Peds  Hematology   Anesthesia Other Findings   Reproductive/Obstetrics                         Anesthesia Physical Anesthesia Plan  ASA:   Anesthesia Plan:    Post-op Pain Management:    Induction:   Airway Management Planned:   Additional Equipment:   Intra-op Plan:   Post-operative Plan:   Informed Consent:   Plan Discussed with:   Anesthesia Plan Comments: (Requests spinal anesthesia. See my note.  Myra Gianotti, PA-C)       Anesthesia Quick Evaluation

## 2013-08-06 MED ORDER — CEFAZOLIN SODIUM-DEXTROSE 2-3 GM-% IV SOLR
2.0000 g | INTRAVENOUS | Status: DC
  Filled 2013-08-06: qty 50

## 2013-08-07 ENCOUNTER — Encounter (HOSPITAL_COMMUNITY)
Admission: RE | Disposition: A | Discharge status: Home / Self Care | Payer: Self-pay | Source: Ambulatory Visit | Attending: Orthopedic Surgery

## 2013-08-07 ENCOUNTER — Ambulatory Visit (HOSPITAL_COMMUNITY)
Admission: RE | Admit: 2013-08-07 | Discharge: 2013-08-07 | Disposition: A | Discharge status: Home / Self Care | Payer: Medicare Other | Source: Ambulatory Visit | Attending: Orthopedic Surgery | Admitting: Orthopedic Surgery

## 2013-08-07 ENCOUNTER — Encounter (HOSPITAL_COMMUNITY): Payer: Self-pay | Admitting: Pharmacy Technician

## 2013-08-07 ENCOUNTER — Encounter (HOSPITAL_COMMUNITY): Payer: Self-pay | Admitting: *Deleted

## 2013-08-07 DIAGNOSIS — G4733 Obstructive sleep apnea (adult) (pediatric): Secondary | ICD-10-CM | POA: Insufficient documentation

## 2013-08-07 DIAGNOSIS — I2510 Atherosclerotic heart disease of native coronary artery without angina pectoris: Secondary | ICD-10-CM | POA: Insufficient documentation

## 2013-08-07 DIAGNOSIS — K219 Gastro-esophageal reflux disease without esophagitis: Secondary | ICD-10-CM | POA: Insufficient documentation

## 2013-08-07 DIAGNOSIS — I10 Essential (primary) hypertension: Secondary | ICD-10-CM | POA: Insufficient documentation

## 2013-08-07 DIAGNOSIS — M1710 Unilateral primary osteoarthritis, unspecified knee: Secondary | ICD-10-CM | POA: Insufficient documentation

## 2013-08-07 DIAGNOSIS — E785 Hyperlipidemia, unspecified: Secondary | ICD-10-CM | POA: Insufficient documentation

## 2013-08-07 DIAGNOSIS — Z538 Procedure and treatment not carried out for other reasons: Secondary | ICD-10-CM | POA: Insufficient documentation

## 2013-08-07 DIAGNOSIS — K589 Irritable bowel syndrome without diarrhea: Secondary | ICD-10-CM | POA: Insufficient documentation

## 2013-08-07 DIAGNOSIS — I252 Old myocardial infarction: Secondary | ICD-10-CM | POA: Insufficient documentation

## 2013-08-07 LAB — CBC WITH DIFFERENTIAL/PLATELET
BASOS ABS: 0.1 10*3/uL (ref 0.0–0.1)
Basophils Relative: 1 % (ref 0–1)
EOS PCT: 10 % — AB (ref 0–5)
Eosinophils Absolute: 0.5 10*3/uL (ref 0.0–0.7)
HCT: 38.7 % (ref 36.0–46.0)
Hemoglobin: 12.7 g/dL (ref 12.0–15.0)
LYMPHS PCT: 31 % (ref 12–46)
Lymphs Abs: 1.4 10*3/uL (ref 0.7–4.0)
MCH: 28.3 pg (ref 26.0–34.0)
MCHC: 32.8 g/dL (ref 30.0–36.0)
MCV: 86.4 fL (ref 78.0–100.0)
MONO ABS: 0.5 10*3/uL (ref 0.1–1.0)
MONOS PCT: 12 % (ref 3–12)
Neutro Abs: 2.1 10*3/uL (ref 1.7–7.7)
Neutrophils Relative %: 46 % (ref 43–77)
Platelets: 201 10*3/uL (ref 150–400)
RBC: 4.48 MIL/uL (ref 3.87–5.11)
RDW: 13.8 % (ref 11.5–15.5)
WBC: 4.5 10*3/uL (ref 4.0–10.5)

## 2013-08-07 LAB — TYPE AND SCREEN
ABO/RH(D): A POS
Antibody Screen: NEGATIVE

## 2013-08-07 LAB — ABO/RH: ABO/RH(D): A POS

## 2013-08-07 LAB — COMPREHENSIVE METABOLIC PANEL
ALK PHOS: 62 U/L (ref 39–117)
ALT: 8 U/L (ref 0–35)
AST: 18 U/L (ref 0–37)
Albumin: 3.7 g/dL (ref 3.5–5.2)
BILIRUBIN TOTAL: 0.7 mg/dL (ref 0.3–1.2)
BUN: 20 mg/dL (ref 6–23)
CALCIUM: 9.8 mg/dL (ref 8.4–10.5)
CO2: 24 meq/L (ref 19–32)
CREATININE: 0.76 mg/dL (ref 0.50–1.10)
Chloride: 102 mEq/L (ref 96–112)
GFR calc non Af Amer: 80 mL/min — ABNORMAL LOW (ref >90)
Glucose, Bld: 104 mg/dL — ABNORMAL HIGH (ref 70–99)
Potassium: 3.9 mEq/L (ref 3.7–5.3)
Sodium: 140 mEq/L (ref 137–147)
Total Protein: 7 g/dL (ref 6.0–8.3)

## 2013-08-07 LAB — APTT: aPTT: 32 seconds (ref 24–37)

## 2013-08-07 LAB — PROTIME-INR
INR: 1.11 (ref 0.00–1.49)
PROTHROMBIN TIME: 14.1 s (ref 11.6–15.2)

## 2013-08-07 SURGERY — ARTHROPLASTY, KNEE, TOTAL
Anesthesia: Spinal | Laterality: Right

## 2013-08-07 MED ORDER — SODIUM CHLORIDE 0.9 % IV SOLN: 1000.0000 mg | INTRAVENOUS | Status: DC

## 2013-08-07 MED ORDER — CHLORHEXIDINE GLUCONATE 4 % EX LIQD
60.0000 mL | Freq: Once | CUTANEOUS | Status: DC
  Filled 2013-08-07: qty 60

## 2013-08-07 MED ORDER — FENTANYL CITRATE 0.05 MG/ML IJ SOLN
INTRAMUSCULAR | Status: AC
  Filled 2013-08-07: qty 5

## 2013-08-07 MED ORDER — MIDAZOLAM HCL 2 MG/2ML IJ SOLN
INTRAMUSCULAR | Status: AC
  Filled 2013-08-07: qty 2

## 2013-08-07 SURGICAL SUPPLY — 56 items
BANDAGE ESMARK 6X9 LF (GAUZE/BANDAGES/DRESSINGS) ×1 IMPLANT
BENZOIN TINCTURE PRP APPL 2/3 (GAUZE/BANDAGES/DRESSINGS) ×3 IMPLANT
BLADE SAGITTAL 25.0X1.19X90 (BLADE) ×2 IMPLANT
BLADE SAGITTAL 25.0X1.19X90MM (BLADE) ×1
BLADE SAW SAG 90X13X1.27 (BLADE) ×3 IMPLANT
BNDG ESMARK 6X9 LF (GAUZE/BANDAGES/DRESSINGS) ×3
BOWL SMART MIX CTS (DISPOSABLE) ×3 IMPLANT
CLOSURE WOUND 1/2 X4 (GAUZE/BANDAGES/DRESSINGS) ×1
COVER SURGICAL LIGHT HANDLE (MISCELLANEOUS) ×3 IMPLANT
CUFF TOURNIQUET SINGLE 34IN LL (TOURNIQUET CUFF) ×3 IMPLANT
CUFF TOURNIQUET SINGLE 44IN (TOURNIQUET CUFF) IMPLANT
DRAPE EXTREMITY T 121X128X90 (DRAPE) ×3 IMPLANT
DRAPE U-SHAPE 47X51 STRL (DRAPES) ×3 IMPLANT
DRSG PAD ABDOMINAL 8X10 ST (GAUZE/BANDAGES/DRESSINGS) ×3 IMPLANT
DURAPREP 26ML APPLICATOR (WOUND CARE) ×3 IMPLANT
ELECT REM PT RETURN 9FT ADLT (ELECTROSURGICAL) ×3
ELECTRODE REM PT RTRN 9FT ADLT (ELECTROSURGICAL) ×1 IMPLANT
EVACUATOR 1/8 PVC DRAIN (DRAIN) ×3 IMPLANT
FACESHIELD WRAPAROUND (MASK) ×3 IMPLANT
GAUZE XEROFORM 5X9 LF (GAUZE/BANDAGES/DRESSINGS) ×3 IMPLANT
GLOVE BIOGEL PI IND STRL 8 (GLOVE) ×2 IMPLANT
GLOVE BIOGEL PI INDICATOR 8 (GLOVE) ×4
GLOVE ECLIPSE 7.5 STRL STRAW (GLOVE) ×6 IMPLANT
GOWN STRL REUS W/ TWL LRG LVL3 (GOWN DISPOSABLE) ×1 IMPLANT
GOWN STRL REUS W/ TWL XL LVL3 (GOWN DISPOSABLE) ×2 IMPLANT
GOWN STRL REUS W/TWL LRG LVL3 (GOWN DISPOSABLE) ×2
GOWN STRL REUS W/TWL XL LVL3 (GOWN DISPOSABLE) ×4
HANDPIECE INTERPULSE COAX TIP (DISPOSABLE) ×2
HOOD PEEL AWAY FACE SHEILD DIS (HOOD) ×9 IMPLANT
IMMOBILIZER KNEE 20 (SOFTGOODS) IMPLANT
IMMOBILIZER KNEE 22 UNIV (SOFTGOODS) ×3 IMPLANT
KIT BASIN OR (CUSTOM PROCEDURE TRAY) ×3 IMPLANT
KIT ROOM TURNOVER OR (KITS) ×3 IMPLANT
MANIFOLD NEPTUNE II (INSTRUMENTS) ×3 IMPLANT
NEEDLE HYPO 25GX1X1/2 BEV (NEEDLE) IMPLANT
NS IRRIG 1000ML POUR BTL (IV SOLUTION) ×3 IMPLANT
PACK TOTAL JOINT (CUSTOM PROCEDURE TRAY) ×3 IMPLANT
PAD ARMBOARD 7.5X6 YLW CONV (MISCELLANEOUS) ×6 IMPLANT
PAD CAST 4YDX4 CTTN HI CHSV (CAST SUPPLIES) ×1 IMPLANT
PADDING CAST COTTON 4X4 STRL (CAST SUPPLIES) ×2
SET HNDPC FAN SPRY TIP SCT (DISPOSABLE) ×1 IMPLANT
SPONGE GAUZE 4X4 12PLY (GAUZE/BANDAGES/DRESSINGS) ×3 IMPLANT
STAPLER VISISTAT 35W (STAPLE) IMPLANT
STRIP CLOSURE SKIN 1/2X4 (GAUZE/BANDAGES/DRESSINGS) ×2 IMPLANT
SUCTION FRAZIER TIP 10 FR DISP (SUCTIONS) ×3 IMPLANT
SUT MNCRL AB 3-0 PS2 18 (SUTURE) IMPLANT
SUT VIC AB 0 CTB1 27 (SUTURE) ×6 IMPLANT
SUT VIC AB 1 CT1 27 (SUTURE) ×4
SUT VIC AB 1 CT1 27XBRD ANBCTR (SUTURE) ×2 IMPLANT
SUT VIC AB 2-0 CTB1 (SUTURE) ×6 IMPLANT
SYR 50ML LL SCALE MARK (SYRINGE) ×3 IMPLANT
SYR CONTROL 10ML LL (SYRINGE) IMPLANT
TOWEL OR 17X24 6PK STRL BLUE (TOWEL DISPOSABLE) ×3 IMPLANT
TOWEL OR 17X26 10 PK STRL BLUE (TOWEL DISPOSABLE) ×3 IMPLANT
TRAY FOLEY CATH 16FRSI W/METER (SET/KITS/TRAYS/PACK) ×3 IMPLANT
WATER STERILE IRR 1000ML POUR (IV SOLUTION) ×6 IMPLANT

## 2013-08-07 NOTE — Pre-Procedure Instructions (Signed)
[name]  08/07/2013   Your procedure is scheduled on:  *Monday, June 15th  Report to [facility] at 0945 AM.  Call this number if you have problems the morning of surgery: [phone]   Remember:   Do not eat food or drink liquids after midnight.   Take these medicines the morning of surgery with A SIP OF WATER: norvasc, atenolol, nexium   Do not wear jewelry, make-up or nail polish.  Do not wear lotions, powders, or perfumes. You may wear deodorant.  Do not shave 48 hours prior to surgery. Men may shave face and neck.  Do not bring valuables to the hospital.  [facility] is not responsible   for any belongings or valuables.               Contacts, dentures or bridgework may not be worn into surgery.  Leave suitcase in the car. After surgery it may be brought to your room.  For patients admitted to the hospital, discharge time is determined by your  treatment team.             [facility] - Preparing for Surgery  Before surgery, you can play an important role.  Because skin is not sterile, your skin needs to be as free of germs as possible.  You can reduce the number of germs on you skin by washing with CHG (chlorahexidine gluconate) soap before surgery.  CHG is an antiseptic cleaner which kills germs and bonds with the skin to continue killing germs even after washing.  Please DO NOT use if you have an allergy to CHG or antibacterial soaps.  If your skin becomes reddened/irritated stop using the CHG and inform your nurse when you arrive at Short Stay.  Do not shave (including legs and underarms) for at least 48 hours prior to the first CHG shower.  You may shave your face.  Please follow these instructions carefully:   1.  Shower with CHG Soap the night before surgery and the morning of Surgery.  2.  If you choose to wash your hair, wash your hair first as usual with your normal shampoo.  3.  After you shampoo, rinse your hair and body thoroughly to  remove the shampoo.  4.  Use CHG as you would any other liquid soap.  You can apply CHG directly to the skin and wash gently with scrungie or a clean washcloth.  5.  Apply the CHG Soap to your body ONLY FROM THE NECK DOWN.  Do not use on open wounds or open sores.  Avoid contact with your eyes, ears, mouth and genitals (private parts).  Wash genitals (private parts) with your normal soap.  6.  Wash thoroughly, paying special attention to the area where your surgery will be performed.  7.  Thoroughly rinse your body with warm water from the neck down.  8.  DO NOT shower/wash with your normal soap after using and rinsing off the CHG Soap.  9.  Pat yourself dry with a clean towel.            10.  Wear clean pajamas.            11.  Place clean sheets on your bed the night of your first shower and do not sleep with pets.  Day of Surgery  Do not apply any lotions/deoderants the morning of surgery.  Please wear clean clothes to the hospital/surgery center.

## 2013-08-07 NOTE — Progress Notes (Signed)
Patient given all new instructions for surgery on Monday. Given new chg soap and instructions reviewed. patient verbalized understanding of when to arrive.

## 2013-08-07 NOTE — H&P (Signed)
TOTAL KNEE ADMISSION H&P Filed Vitals:   08/07/13 0838  BP: 142/81  Pulse: 81  Temp: 97.7 F (36.5 C)  Resp: 18   Patient is being admitted for right total knee arthroplasty.  Subjective:  Chief Complaint:right knee pain.  HPI: [name], 79 y.o. female, has a history of pain and functional disability in the right knee due to arthritis and has failed non-surgical conservative treatments for greater than 12 weeks to includeNSAID's and/or analgesics, corticosteriod injections, viscosupplementation injections, flexibility and strengthening excercises, weight reduction as appropriate and activity modification.  Onset of symptoms was gradual, starting 8 years ago with gradually worsening course since that time. The patient noted no past surgery on the right knee(s).  Patient currently rates pain in the right knee(s) at 5 out of 10 with activity. Patient has night pain, worsening of pain with activity and weight bearing, pain that interferes with activities of daily living, pain with passive range of motion, crepitus and joint swelling.  Patient has evidence of subchondral sclerosis, periarticular osteophytes and joint space narrowing by imaging studies. This patient has had failure of all conservative care. There is no active infection.  Patient Active Problem List   Diagnosis Date Noted  . OSA on CPAP 06/16/2013  . OSA (obstructive sleep apnea)   . Atrial fibrillation 12/15/2010  . Bradycardia 10/25/2010  . Fatigue 10/25/2010  . Dyspnea 09/18/2010  . MI, acute, non ST segment elevation   . Coronary artery disease   . Hypokalemia   . Incontinence of urine   . Stromal tumor of the stomach   . DYSLIPIDEMIA 05/06/2007  . HYPERTENSION 05/06/2007  . GERD 05/06/2007  . IRRITABLE BOWEL SYNDROME 05/06/2007   Past Medical History  Diagnosis Date  . MI, acute, non ST segment elevation Dec.7,2011    cath  . Coronary artery disease     minimal  . Hypertension   . Hyperlipidemia   .  Hypokalemia   . Incontinence of urine   . Stromal tumor of the stomach     cancer  . Atrial fibrillation   . Arthritis   . GERD (gastroesophageal reflux disease)   . Chronic kidney disease     urinary incont-  . Hiatal hernia   . Osteopenia   . Bladder polyps   . IBS (irritable bowel syndrome)   . Cervical polyp   . Cataracts, bilateral   . IFG (impaired fasting glucose)   . OSA (obstructive sleep apnea)     hypoxia , not CO 2 retainer,   . OSA on CPAP 06/16/2013     Sleep study 1- 11-15 with an AHI of 61 , now titrated to CPAP 9 cm water with 2 cm EPR.   . Cancer     stomach  . Dysrhythmia      AF  hx     dr Acie Fredrickson    Past Surgical History  Procedure Laterality Date  . Stromal stomach  tumor  03/26/2007    resection  . Ankle fracture surgery  2006    right, plate   . Cardiac catheterization  12/07/20210    Dr.Nahser,minor cad  . Eye surgery      both cataracts  . Colonoscopy    . Upper gi endoscopy    . Knee arthroscopy Right 05/28/2012    Procedure: RIGHT KNEE ARTHROSCOPY PARTIAL MEDIAL AND LATERAL MENISECTOMY WITH CHONDROPLASTY;  Surgeon: Alta Corning, MD;  Location: [place];  Service: Orthopedics;  Laterality: Right;  No prescriptions prior to admission   Allergies  Allergen Reactions  . Pradaxa [Dabigatran Etexilate Mesylate] Nausea Only    History  Substance Use Topics  . Smoking status: Never Smoker   . Smokeless tobacco: Never Used  . Alcohol Use: 1.2 oz/week    2 Glasses of wine per week     Comment: one per week    Family History  Problem Relation Age of Onset  . Pancreatic cancer Paternal Grandmother   . Diabetes Paternal Aunt   . Heart disease Paternal Grandfather     both sides of the family     ROS ROS: I have reviewed the patient's review of systems thoroughly and there are no positive responses as relates to the HPI. Objective:  Physical Exam  Vital signs in last 24 hours:   Well-developed well-nourished patient in  no acute distress. Alert and oriented x3 HEENT:within normal limits Cardiac: Regular rate and rhythm Pulmonary: Lungs clear to auscultation Abdomen: Soft and nontender.  Normal active bowel sounds  Musculoskeletal: (right knee: Tender to palpation over the medial joint line.  Mild varus malalignment.  Pain grinding through range of motion.  Range of motion 0-95. Labs: Recent Results (from the past 2160 hour(s))  SURGICAL PCR SCREEN     Status: None   Collection Time    07/28/13  2:30 PM      Result Value Ref Range   MRSA, PCR NEGATIVE  NEGATIVE   Staphylococcus aureus NEGATIVE  NEGATIVE   Comment:            The Xpert SA Assay (FDA     approved for NASAL specimens     in patients over 75 years of age),     is one component of     a comprehensive surveillance     program.  Test performance has     been validated by [employer] for patients greater     than or equal to 35 year old.     It is not intended     to diagnose infection nor to     guide or monitor treatment.  BASIC METABOLIC PANEL     Status: Abnormal   Collection Time    07/28/13  2:30 PM      Result Value Ref Range   Sodium 138  137 - 147 mEq/L   Potassium 3.2 (*) 3.7 - 5.3 mEq/L   Chloride 98  96 - 112 mEq/L   CO2 29  19 - 32 mEq/L   Glucose, Bld 86  70 - 99 mg/dL   BUN 16  6 - 23 mg/dL   Creatinine, Ser 0.78  0.50 - 1.10 mg/dL   Calcium 10.0  8.4 - 10.5 mg/dL   GFR calc non Af Amer 79 (*) >90 mL/min   GFR calc Af Amer >90  >90 mL/min   Comment: (NOTE)     The eGFR has been calculated using the CKD EPI equation.     This calculation has not been validated in all clinical situations.     eGFR's persistently <90 mL/min signify possible Chronic Kidney     Disease.  CBC     Status: None   Collection Time    07/28/13  2:30 PM      Result Value Ref Range   WBC 5.3  4.0 - 10.5 K/uL   RBC 4.39  3.87 - 5.11 MIL/uL   Hemoglobin 12.6  12.0 - 15.0 g/dL  HCT 37.7  36.0 - 46.0 %   MCV 85.9  78.0 - 100.0 fL    MCH 28.7  26.0 - 34.0 pg   MCHC 33.4  30.0 - 36.0 g/dL   RDW 13.5  11.5 - 15.5 %   Platelets 199  150 - 400 K/uL  PROTIME-INR     Status: Abnormal   Collection Time    07/28/13  2:30 PM      Result Value Ref Range   Prothrombin Time 17.6 (*) 11.6 - 15.2 seconds   INR 1.49  0.00 - 1.49  APTT     Status: Abnormal   Collection Time    07/28/13  2:30 PM      Result Value Ref Range   aPTT 41 (*) 24 - 37 seconds   Comment:            IF BASELINE aPTT IS ELEVATED,     SUGGEST PATIENT RISK ASSESSMENT     BE USED TO DETERMINE APPROPRIATE     ANTICOAGULANT THERAPY.    Estimated body mass index is 27.46 kg/(m^2) as calculated from the following:   Height as of 06/16/13: $RemoveBef'5\' 5"'[id number]$  (1.651 m).   Weight as of 06/16/13: 165 lb (74.844 kg).   Imaging Review Plain radiographs demonstrate severe degenerative joint disease of the right knee(s). The overall alignment ismild varus. The bone quality appears to be good for age and reported activity level.  Assessment/Plan:  End stage arthritis, right knee   The patient history, physical examination, clinical judgment of the provider and imaging studies are consistent with end stage degenerative joint disease of the right knee(s) and total knee arthroplasty is deemed medically necessary. The treatment options including medical management, injection therapy arthroscopy and arthroplasty were discussed at length. The risks and benefits of total knee arthroplasty were presented and reviewed. The risks due to aseptic loosening, infection, stiffness, patella tracking problems, thromboembolic complications and other imponderables were discussed. The patient acknowledged the explanation, agreed to proceed with the plan and consent was signed. Patient is being admitted for inpatient treatment for surgery, pain control, PT, OT, prophylactic antibiotics, VTE prophylaxis, progressive ambulation and ADL's and discharge planning. The patient is planning to be discharged home  with home health services

## 2013-08-10 ENCOUNTER — Encounter (HOSPITAL_COMMUNITY): Payer: Medicare Other | Admitting: Certified Registered"

## 2013-08-10 ENCOUNTER — Encounter (HOSPITAL_COMMUNITY): Payer: Self-pay | Admitting: *Deleted

## 2013-08-10 ENCOUNTER — Inpatient Hospital Stay (HOSPITAL_COMMUNITY)
Admission: RE | Admit: 2013-08-10 | Discharge: 2013-08-12 | DRG: 470 | Disposition: A | Discharge status: Home Health | Payer: Medicare Other | Source: Ambulatory Visit | Attending: Orthopedic Surgery | Admitting: Orthopedic Surgery

## 2013-08-10 ENCOUNTER — Inpatient Hospital Stay (HOSPITAL_COMMUNITY): Payer: Medicare Other | Admitting: Certified Registered"

## 2013-08-10 ENCOUNTER — Encounter (HOSPITAL_COMMUNITY)
Admission: RE | Disposition: A | Discharge status: Home Health | Payer: Self-pay | Source: Ambulatory Visit | Attending: Orthopedic Surgery

## 2013-08-10 DIAGNOSIS — I2510 Atherosclerotic heart disease of native coronary artery without angina pectoris: Secondary | ICD-10-CM | POA: Diagnosis present

## 2013-08-10 DIAGNOSIS — I129 Hypertensive chronic kidney disease with stage 1 through stage 4 chronic kidney disease, or unspecified chronic kidney disease: Secondary | ICD-10-CM | POA: Diagnosis present

## 2013-08-10 DIAGNOSIS — I4891 Unspecified atrial fibrillation: Secondary | ICD-10-CM | POA: Diagnosis present

## 2013-08-10 DIAGNOSIS — R7301 Impaired fasting glucose: Secondary | ICD-10-CM | POA: Diagnosis present

## 2013-08-10 DIAGNOSIS — E876 Hypokalemia: Secondary | ICD-10-CM | POA: Diagnosis present

## 2013-08-10 DIAGNOSIS — E785 Hyperlipidemia, unspecified: Secondary | ICD-10-CM | POA: Diagnosis present

## 2013-08-10 DIAGNOSIS — G4733 Obstructive sleep apnea (adult) (pediatric): Secondary | ICD-10-CM | POA: Diagnosis present

## 2013-08-10 DIAGNOSIS — M1711 Unilateral primary osteoarthritis, right knee: Secondary | ICD-10-CM | POA: Diagnosis present

## 2013-08-10 DIAGNOSIS — N189 Chronic kidney disease, unspecified: Secondary | ICD-10-CM | POA: Diagnosis present

## 2013-08-10 DIAGNOSIS — I252 Old myocardial infarction: Secondary | ICD-10-CM

## 2013-08-10 DIAGNOSIS — M1710 Unilateral primary osteoarthritis, unspecified knee: Principal | ICD-10-CM | POA: Diagnosis present

## 2013-08-10 DIAGNOSIS — K219 Gastro-esophageal reflux disease without esophagitis: Secondary | ICD-10-CM | POA: Diagnosis present

## 2013-08-10 HISTORY — PX: TOTAL KNEE ARTHROPLASTY: SHX125

## 2013-08-10 SURGERY — ARTHROPLASTY, KNEE, TOTAL
Anesthesia: Monitor Anesthesia Care | Site: Knee | Laterality: Right

## 2013-08-10 MED ORDER — NITROGLYCERIN 0.4 MG SL SUBL: 0.4000 mg | SUBLINGUAL_TABLET | SUBLINGUAL | Status: DC | PRN

## 2013-08-10 MED ORDER — RIVAROXABAN 20 MG PO TABS
20.0000 mg | ORAL_TABLET | Freq: Every day | ORAL | Status: DC
  Administered 2013-08-11: 20 mg via ORAL
  Filled 2013-08-10 (×2): qty 1

## 2013-08-10 MED ORDER — ACETAMINOPHEN 325 MG PO TABS: 650.0000 mg | ORAL_TABLET | Freq: Four times a day (QID) | ORAL | Status: DC | PRN

## 2013-08-10 MED ORDER — EPHEDRINE SULFATE 50 MG/ML IJ SOLN
INTRAMUSCULAR | Status: DC | PRN
  Administered 2013-08-10 (×3): 10 mg via INTRAVENOUS

## 2013-08-10 MED ORDER — BUPIVACAINE LIPOSOME 1.3 % IJ SUSP
20.0000 mL | INTRAMUSCULAR | Status: DC
  Filled 2013-08-10: qty 20

## 2013-08-10 MED ORDER — OXYCODONE-ACETAMINOPHEN 5-325 MG PO TABS: 1.0000 | ORAL_TABLET | Freq: Four times a day (QID) | ORAL | Status: DC | PRN

## 2013-08-10 MED ORDER — SODIUM CHLORIDE 0.9 % IR SOLN
Status: DC | PRN
  Administered 2013-08-10: 3000 mL

## 2013-08-10 MED ORDER — PHENYLEPHRINE 40 MCG/ML (10ML) SYRINGE FOR IV PUSH (FOR BLOOD PRESSURE SUPPORT)
PREFILLED_SYRINGE | INTRAVENOUS | Status: AC
  Filled 2013-08-10: qty 10

## 2013-08-10 MED ORDER — OXYCODONE HCL 5 MG PO TABS
ORAL_TABLET | ORAL | Status: AC
  Filled 2013-08-10: qty 1

## 2013-08-10 MED ORDER — POTASSIUM CHLORIDE CRYS ER 10 MEQ PO TBCR
5.0000 meq | EXTENDED_RELEASE_TABLET | Freq: Every day | ORAL | Status: DC
  Administered 2013-08-10: 5 meq via ORAL
  Filled 2013-08-10 (×4): qty 1

## 2013-08-10 MED ORDER — DOCUSATE SODIUM 100 MG PO CAPS
100.0000 mg | ORAL_CAPSULE | Freq: Two times a day (BID) | ORAL | Status: DC
  Administered 2013-08-10 – 2013-08-12 (×4): 100 mg via ORAL
  Filled 2013-08-10 (×5): qty 1

## 2013-08-10 MED ORDER — BUPIVACAINE IN DEXTROSE 0.75-8.25 % IT SOLN
INTRATHECAL | Status: DC | PRN
  Administered 2013-08-10: 1.8 mL via INTRATHECAL

## 2013-08-10 MED ORDER — OXYCODONE HCL 5 MG/5ML PO SOLN: 5.0000 mg | Freq: Once | ORAL | Status: AC | PRN

## 2013-08-10 MED ORDER — ACETAMINOPHEN 160 MG/5ML PO SOLN
325.0000 mg | ORAL | Status: DC | PRN
  Filled 2013-08-10: qty 20.3

## 2013-08-10 MED ORDER — MIDAZOLAM HCL 2 MG/2ML IJ SOLN
INTRAMUSCULAR | Status: AC
  Filled 2013-08-10: qty 2

## 2013-08-10 MED ORDER — METHOCARBAMOL 500 MG PO TABS
500.0000 mg | ORAL_TABLET | Freq: Four times a day (QID) | ORAL | Status: DC | PRN
  Administered 2013-08-11: 500 mg via ORAL
  Filled 2013-08-10 (×2): qty 1

## 2013-08-10 MED ORDER — FENTANYL CITRATE 0.05 MG/ML IJ SOLN
INTRAMUSCULAR | Status: AC
  Administered 2013-08-10: 50 ug via INTRAVENOUS
  Filled 2013-08-10: qty 2

## 2013-08-10 MED ORDER — BISACODYL 5 MG PO TBEC: 5.0000 mg | DELAYED_RELEASE_TABLET | Freq: Every day | ORAL | Status: DC | PRN

## 2013-08-10 MED ORDER — FENTANYL CITRATE 0.05 MG/ML IJ SOLN
25.0000 ug | INTRAMUSCULAR | Status: DC | PRN
  Administered 2013-08-10 (×2): 50 ug via INTRAVENOUS

## 2013-08-10 MED ORDER — MIDAZOLAM HCL 5 MG/5ML IJ SOLN
INTRAMUSCULAR | Status: DC | PRN
  Administered 2013-08-10 (×2): 1 mg via INTRAVENOUS

## 2013-08-10 MED ORDER — ATENOLOL 25 MG PO TABS
25.0000 mg | ORAL_TABLET | Freq: Every day | ORAL | Status: DC
  Administered 2013-08-11 – 2013-08-12 (×2): 25 mg via ORAL
  Filled 2013-08-10 (×2): qty 1

## 2013-08-10 MED ORDER — BUPIVACAINE LIPOSOME 1.3 % IJ SUSP
INTRAMUSCULAR | Status: DC | PRN
  Administered 2013-08-10: 20 mL

## 2013-08-10 MED ORDER — ACETAMINOPHEN 325 MG PO TABS: 325.0000 mg | ORAL_TABLET | ORAL | Status: DC | PRN

## 2013-08-10 MED ORDER — LACTATED RINGERS IV SOLN
INTRAVENOUS | Status: DC
  Administered 2013-08-10 (×2): via INTRAVENOUS

## 2013-08-10 MED ORDER — PANTOPRAZOLE SODIUM 40 MG PO TBEC
80.0000 mg | DELAYED_RELEASE_TABLET | Freq: Every day | ORAL | Status: DC
  Administered 2013-08-11 – 2013-08-12 (×2): 80 mg via ORAL
  Filled 2013-08-10 (×2): qty 2

## 2013-08-10 MED ORDER — SODIUM CHLORIDE 0.9 % IV SOLN
INTRAVENOUS | Status: DC | PRN
  Administered 2013-08-10: 40 mL via INTRAMUSCULAR

## 2013-08-10 MED ORDER — ONDANSETRON HCL 4 MG PO TABS
4.0000 mg | ORAL_TABLET | Freq: Four times a day (QID) | ORAL | Status: DC | PRN
  Administered 2013-08-10: 4 mg via ORAL
  Filled 2013-08-10: qty 1

## 2013-08-10 MED ORDER — ALUM & MAG HYDROXIDE-SIMETH 200-200-20 MG/5ML PO SUSP: 30.0000 mL | ORAL | Status: DC | PRN

## 2013-08-10 MED ORDER — FENTANYL CITRATE 0.05 MG/ML IJ SOLN: 50.0000 ug | INTRAMUSCULAR | Status: DC | PRN

## 2013-08-10 MED ORDER — CEFAZOLIN SODIUM-DEXTROSE 2-3 GM-% IV SOLR
INTRAVENOUS | Status: DC | PRN
  Administered 2013-08-10: 2 g via INTRAVENOUS

## 2013-08-10 MED ORDER — ACETAMINOPHEN 650 MG RE SUPP: 650.0000 mg | Freq: Four times a day (QID) | RECTAL | Status: DC | PRN

## 2013-08-10 MED ORDER — HYDROCHLOROTHIAZIDE 12.5 MG PO CAPS
12.5000 mg | ORAL_CAPSULE | Freq: Two times a day (BID) | ORAL | Status: DC
  Administered 2013-08-10 – 2013-08-12 (×4): 12.5 mg via ORAL
  Filled 2013-08-10 (×5): qty 1

## 2013-08-10 MED ORDER — OXYCODONE-ACETAMINOPHEN 5-325 MG PO TABS
1.0000 | ORAL_TABLET | ORAL | Status: DC | PRN
  Administered 2013-08-10: 1 via ORAL
  Administered 2013-08-11 – 2013-08-12 (×3): 2 via ORAL
  Filled 2013-08-10: qty 2
  Filled 2013-08-10: qty 1
  Filled 2013-08-10 (×2): qty 2

## 2013-08-10 MED ORDER — BUPIVACAINE HCL (PF) 0.5 % IJ SOLN
INTRAMUSCULAR | Status: DC | PRN
  Administered 2013-08-10: 15 mL via PERINEURAL

## 2013-08-10 MED ORDER — PHENYLEPHRINE HCL 10 MG/ML IJ SOLN
INTRAMUSCULAR | Status: DC | PRN
  Administered 2013-08-10: 120 ug via INTRAVENOUS
  Administered 2013-08-10: 160 ug via INTRAVENOUS
  Administered 2013-08-10: 120 ug via INTRAVENOUS

## 2013-08-10 MED ORDER — METHOCARBAMOL 500 MG PO TABS: 750.0000 mg | ORAL_TABLET | Freq: Three times a day (TID) | ORAL | Status: DC | PRN

## 2013-08-10 MED ORDER — METHOCARBAMOL 1000 MG/10ML IJ SOLN
500.0000 mg | Freq: Four times a day (QID) | INTRAVENOUS | Status: DC | PRN
  Filled 2013-08-10: qty 5

## 2013-08-10 MED ORDER — ONDANSETRON HCL 4 MG/2ML IJ SOLN
4.0000 mg | Freq: Four times a day (QID) | INTRAMUSCULAR | Status: DC | PRN
  Administered 2013-08-11: 4 mg via INTRAVENOUS
  Filled 2013-08-10: qty 2

## 2013-08-10 MED ORDER — ATORVASTATIN CALCIUM 20 MG PO TABS
20.0000 mg | ORAL_TABLET | Freq: Every day | ORAL | Status: DC
  Administered 2013-08-11 – 2013-08-12 (×2): 20 mg via ORAL
  Filled 2013-08-10 (×3): qty 1

## 2013-08-10 MED ORDER — MIDAZOLAM HCL 2 MG/2ML IJ SOLN: 1.0000 mg | INTRAMUSCULAR | Status: DC | PRN

## 2013-08-10 MED ORDER — PROPOFOL INFUSION 10 MG/ML OPTIME
INTRAVENOUS | Status: DC | PRN
  Administered 2013-08-10: 75 ug/kg/min via INTRAVENOUS

## 2013-08-10 MED ORDER — POLYETHYLENE GLYCOL 3350 17 G PO PACK: 17.0000 g | PACK | Freq: Every day | ORAL | Status: DC | PRN

## 2013-08-10 MED ORDER — IRBESARTAN 150 MG PO TABS
150.0000 mg | ORAL_TABLET | Freq: Two times a day (BID) | ORAL | Status: DC
  Administered 2013-08-10 – 2013-08-12 (×4): 150 mg via ORAL
  Filled 2013-08-10 (×6): qty 1

## 2013-08-10 MED ORDER — CEFAZOLIN SODIUM-DEXTROSE 2-3 GM-% IV SOLR
2.0000 g | Freq: Four times a day (QID) | INTRAVENOUS | Status: AC
  Administered 2013-08-10 – 2013-08-11 (×2): 2 g via INTRAVENOUS
  Filled 2013-08-10 (×2): qty 50

## 2013-08-10 MED ORDER — SODIUM CHLORIDE 0.9 % IV SOLN
INTRAVENOUS | Status: DC
  Administered 2013-08-10 – 2013-08-11 (×2): via INTRAVENOUS

## 2013-08-10 MED ORDER — FENTANYL CITRATE 0.05 MG/ML IJ SOLN
INTRAMUSCULAR | Status: AC
  Filled 2013-08-10: qty 5

## 2013-08-10 MED ORDER — PROMETHAZINE HCL 25 MG/ML IJ SOLN: 6.2500 mg | INTRAMUSCULAR | Status: DC | PRN

## 2013-08-10 MED ORDER — AMLODIPINE BESYLATE 5 MG PO TABS
5.0000 mg | ORAL_TABLET | Freq: Every day | ORAL | Status: DC
  Administered 2013-08-11 – 2013-08-12 (×2): 5 mg via ORAL
  Filled 2013-08-10 (×2): qty 1

## 2013-08-10 MED ORDER — DOXAZOSIN MESYLATE 4 MG PO TABS
4.0000 mg | ORAL_TABLET | Freq: Every day | ORAL | Status: DC
  Administered 2013-08-10 – 2013-08-11 (×2): 4 mg via ORAL
  Filled 2013-08-10 (×3): qty 1

## 2013-08-10 MED ORDER — DIPHENHYDRAMINE HCL 12.5 MG/5ML PO ELIX: 12.5000 mg | ORAL_SOLUTION | ORAL | Status: DC | PRN

## 2013-08-10 MED ORDER — HYDROMORPHONE HCL PF 1 MG/ML IJ SOLN: 0.5000 mg | INTRAMUSCULAR | Status: DC | PRN

## 2013-08-10 MED ORDER — FENTANYL CITRATE 0.05 MG/ML IJ SOLN
INTRAMUSCULAR | Status: DC | PRN
  Administered 2013-08-10: 50 ug via INTRAVENOUS

## 2013-08-10 MED ORDER — OXYCODONE HCL 5 MG PO TABS
5.0000 mg | ORAL_TABLET | Freq: Once | ORAL | Status: AC | PRN
  Administered 2013-08-10: 5 mg via ORAL

## 2013-08-10 SURGICAL SUPPLY — 60 items
BANDAGE ESMARK 6X9 LF (GAUZE/BANDAGES/DRESSINGS) ×1 IMPLANT
BENZOIN TINCTURE PRP APPL 2/3 (GAUZE/BANDAGES/DRESSINGS) ×3 IMPLANT
BLADE SAGITTAL 25.0X1.19X90 (BLADE) ×2 IMPLANT
BLADE SAGITTAL 25.0X1.19X90MM (BLADE) ×1
BLADE SAW SAG 90X13X1.27 (BLADE) ×3 IMPLANT
BNDG ESMARK 6X9 LF (GAUZE/BANDAGES/DRESSINGS) ×3
BOWL SMART MIX CTS (DISPOSABLE) ×3 IMPLANT
CAPT RP KNEE ×3 IMPLANT
CEMENT HV SMART SET (Cement) ×6 IMPLANT
CLOSURE WOUND 1/2 X4 (GAUZE/BANDAGES/DRESSINGS) ×1
COVER SURGICAL LIGHT HANDLE (MISCELLANEOUS) ×3 IMPLANT
CUFF TOURNIQUET SINGLE 34IN LL (TOURNIQUET CUFF) ×3 IMPLANT
CUFF TOURNIQUET SINGLE 44IN (TOURNIQUET CUFF) IMPLANT
DRAPE EXTREMITY T 121X128X90 (DRAPE) ×3 IMPLANT
DRAPE U-SHAPE 47X51 STRL (DRAPES) ×3 IMPLANT
DRSG MEPILEX BORDER 4X12 (GAUZE/BANDAGES/DRESSINGS) ×3 IMPLANT
DRSG PAD ABDOMINAL 8X10 ST (GAUZE/BANDAGES/DRESSINGS) ×3 IMPLANT
DURAPREP 26ML APPLICATOR (WOUND CARE) ×3 IMPLANT
ELECT REM PT RETURN 9FT ADLT (ELECTROSURGICAL) ×3
ELECTRODE REM PT RTRN 9FT ADLT (ELECTROSURGICAL) ×1 IMPLANT
EVACUATOR 1/8 PVC DRAIN (DRAIN) ×3 IMPLANT
FACESHIELD WRAPAROUND (MASK) ×3 IMPLANT
GAUZE XEROFORM 1X8 LF (GAUZE/BANDAGES/DRESSINGS) ×3 IMPLANT
GAUZE XEROFORM 5X9 LF (GAUZE/BANDAGES/DRESSINGS) ×3 IMPLANT
GLOVE BIOGEL PI IND STRL 8 (GLOVE) ×2 IMPLANT
GLOVE BIOGEL PI INDICATOR 8 (GLOVE) ×4
GLOVE ECLIPSE 7.5 STRL STRAW (GLOVE) ×6 IMPLANT
GOWN STRL REUS W/ TWL LRG LVL3 (GOWN DISPOSABLE) ×1 IMPLANT
GOWN STRL REUS W/ TWL XL LVL3 (GOWN DISPOSABLE) ×2 IMPLANT
GOWN STRL REUS W/TWL LRG LVL3 (GOWN DISPOSABLE) ×2
GOWN STRL REUS W/TWL XL LVL3 (GOWN DISPOSABLE) ×4
HANDPIECE INTERPULSE COAX TIP (DISPOSABLE) ×2
HOOD PEEL AWAY FACE SHEILD DIS (HOOD) ×9 IMPLANT
IMMOBILIZER KNEE 20 (SOFTGOODS) IMPLANT
IMMOBILIZER KNEE 22 UNIV (SOFTGOODS) ×3 IMPLANT
KIT BASIN OR (CUSTOM PROCEDURE TRAY) ×3 IMPLANT
KIT ROOM TURNOVER OR (KITS) ×3 IMPLANT
MANIFOLD NEPTUNE II (INSTRUMENTS) ×3 IMPLANT
NEEDLE HYPO 25GX1X1/2 BEV (NEEDLE) IMPLANT
NS IRRIG 1000ML POUR BTL (IV SOLUTION) ×3 IMPLANT
PACK TOTAL JOINT (CUSTOM PROCEDURE TRAY) ×3 IMPLANT
PAD ARMBOARD 7.5X6 YLW CONV (MISCELLANEOUS) ×6 IMPLANT
PAD CAST 4YDX4 CTTN HI CHSV (CAST SUPPLIES) ×1 IMPLANT
PADDING CAST COTTON 4X4 STRL (CAST SUPPLIES) ×2
SET HNDPC FAN SPRY TIP SCT (DISPOSABLE) ×1 IMPLANT
SPONGE GAUZE 4X4 12PLY (GAUZE/BANDAGES/DRESSINGS) ×3 IMPLANT
STAPLER VISISTAT 35W (STAPLE) IMPLANT
STRIP CLOSURE SKIN 1/2X4 (GAUZE/BANDAGES/DRESSINGS) ×2 IMPLANT
SUCTION FRAZIER TIP 10 FR DISP (SUCTIONS) ×3 IMPLANT
SUT MNCRL AB 3-0 PS2 18 (SUTURE) IMPLANT
SUT VIC AB 0 CTB1 27 (SUTURE) ×6 IMPLANT
SUT VIC AB 1 CT1 27 (SUTURE) ×4
SUT VIC AB 1 CT1 27XBRD ANBCTR (SUTURE) ×2 IMPLANT
SUT VIC AB 2-0 CTB1 (SUTURE) ×6 IMPLANT
SYR 50ML LL SCALE MARK (SYRINGE) ×3 IMPLANT
SYR CONTROL 10ML LL (SYRINGE) IMPLANT
TOWEL OR 17X24 6PK STRL BLUE (TOWEL DISPOSABLE) ×3 IMPLANT
TOWEL OR 17X26 10 PK STRL BLUE (TOWEL DISPOSABLE) ×3 IMPLANT
TRAY FOLEY CATH 16FRSI W/METER (SET/KITS/TRAYS/PACK) ×3 IMPLANT
WATER STERILE IRR 1000ML POUR (IV SOLUTION) ×6 IMPLANT

## 2013-08-10 NOTE — Brief Op Note (Signed)
08/10/2013  3:37 PM  PATIENT:  [name]  79 y.o. female  PRE-OPERATIVE DIAGNOSIS:  DJD   POST-OPERATIVE DIAGNOSIS:  degenerative joint disease right knee  PROCEDURE:  Procedure(s): TOTAL KNEE ARTHROPLASTY (Right)  SURGEON:  Surgeon(s) and Role:    * Alta Corning, MD - Primary  PHYSICIAN ASSISTANT:   ASSISTANTS: bethune    ANESTHESIA:   spinal  EBL:  Total I/O In: 1400 [I.V.:1400] Out: 400 [Urine:350; Blood:50]  BLOOD ADMINISTERED:none  DRAINS: (1) Hemovact drain(s) in the r knee with  Suction Open   LOCAL MEDICATIONS USED:  OTHER experel   SPECIMEN:  No Specimen  DISPOSITION OF SPECIMEN:  N/A  COUNTS:  YES  TOURNIQUET:   Total Tourniquet Time Documented: Thigh (Right) - 55 minutes Total: Thigh (Right) - 55 minutes   DICTATION: .Other Dictation: Dictation Number [id number]  PLAN OF CARE: Admit to inpatient   PATIENT DISPOSITION:  PACU - hemodynamically stable.   Delay start of Pharmacological VTE agent (>24hrs) due to surgical blood loss or risk of bleeding: no

## 2013-08-10 NOTE — Anesthesia Preprocedure Evaluation (Addendum)
Anesthesia Evaluation  Patient identified by MRN, date of birth, ID band Patient awake    Reviewed: Allergy & Precautions, H&P , NPO status , Patient's Chart, lab work & pertinent test results  History of Anesthesia Complications Negative for: history of anesthetic complications  Airway Mallampati: II TM Distance: >3 FB Neck ROM: Full    Dental  (+) Teeth Intact   Pulmonary shortness of breath and with exertion, sleep apnea and Continuous Positive Airway Pressure Ventilation ,  breath sounds clear to auscultation        Cardiovascular hypertension, Pt. on medications - angina+ CAD, + Past MI and +CHF + dysrhythmias Atrial Fibrillation Rhythm:Regular     Neuro/Psych negative neurological ROS  negative psych ROS   GI/Hepatic Neg liver ROS, hiatal hernia, GERD-  Medicated and Controlled,  Endo/Other  negative endocrine ROS  Renal/GU negative Renal ROS     Musculoskeletal   Abdominal   Peds  Hematology   Anesthesia Other Findings   Reproductive/Obstetrics                          Anesthesia Physical Anesthesia Plan  ASA: III  Anesthesia Plan: MAC, Regional and Spinal   Post-op Pain Management:    Induction:   Airway Management Planned: Natural Airway  Additional Equipment: None  Intra-op Plan:   Post-operative Plan:   Informed Consent: I have reviewed the patients History and Physical, chart, labs and discussed the procedure including the risks, benefits and alternatives for the proposed anesthesia with the patient or authorized representative who has indicated his/her understanding and acceptance.   Dental advisory given  Plan Discussed with: CRNA and Surgeon  Anesthesia Plan Comments:         Anesthesia Quick Evaluation
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
## 2013-08-10 NOTE — Anesthesia Procedure Notes (Addendum)
Procedure Name: MAC Date/Time: 08/10/2013 11:55 AM Performed by: Julian Reil Pre-anesthesia Checklist: Patient identified, Emergency Drugs available, Suction available and Patient being monitored Patient Re-evaluated:Patient Re-evaluated prior to inductionOxygen Delivery Method: Simple face mask Intubation Type: IV induction Placement Confirmation: positive ETCO2 and breath sounds checked- equal and bilateral   Anesthesia Regional Block:  Adductor canal block  Pre-Anesthetic Checklist: ,, timeout performed, Correct Patient, Correct Site, Correct Laterality, Correct Procedure, Correct Position, site marked, Risks and benefits discussed,  Surgical consent,  Pre-op evaluation,  At surgeon's request and post-op pain management  Laterality: Lower, Left and Right  Prep: chloraprep       Needles:  Injection technique: Single-shot  Needle Type: Echogenic Needle          Additional Needles:  Procedures: ultrasound guided (picture in chart) Adductor canal block Narrative:  Start time: 08/10/2013 12:07 PM End time: 08/10/2013 12:11 PM Injection made incrementally with aspirations every 5 mL.  Performed by: Personally  Anesthesiologist: Analyah Mcconnon  Additional Notes: H+P and labs reviewed, risks and benefits discussed with patient, procedure tolerated well without complications   Spinal  Patient location during procedure: OB Start time: 08/10/2013 11:58 AM End time: 08/10/2013 12:02 PM Staffing Anesthesiologist: Sukari Grist, CHRIS Preanesthetic Checklist Completed: patient identified, surgical consent, pre-op evaluation, timeout performed, IV checked, risks and benefits discussed and monitors and equipment checked Spinal Block Patient position: sitting Prep: site prepped and draped and DuraPrep Patient monitoring: heart rate, cardiac monitor, continuous pulse ox and blood pressure Approach: midline Location: L3-4 Injection technique: single-shot Needle Needle type: Pencan  Needle  gauge: 24 G Needle length: 10 cm Assessment Sensory level: T6

## 2013-08-10 NOTE — Discharge Instructions (Signed)
Total Knee Replacement Care After Refer to this sheet in the next few weeks. These instructions provide you with information on caring for yourself after your procedure. Your caregiver also may give you specific instructions. Your treatment has been planned according to the most current medical practices, but problems sometimes occur. Call your caregiver if you have any problems or questions after your procedure. HOME CARE INSTRUCTIONS   See a physical therapist as directed by your caregiver.  Take over-the-counter or prescription medicines for pain, discomfort, or fever only as directed by your caregiver.  Avoid lifting or driving until you are instructed otherwise.  If you have been sent home with a continuous passive motion machine, use it as directed by your caregiver. SEEK MEDICAL CARE IF:  You have difficulty breathing.  Your wound is red, swollen, or has become increasingly painful.  You have pus draining from your wound.  You have a bad smell coming from your wound.  You have persistent bleeding from your wound.  Your wound breaks open after sutures (stitches) or staples have been removed. SEEK IMMEDIATE MEDICAL CARE IF:   You have a fever.  You have a rash.  You have pain or swelling in your calf or thigh.  You have shortness of breath or chest pain.  Your range of motion in your knee is decreasing rather than increasing. MAKE SURE YOU:   Understand these instructions.  Will watch your condition.  Will get help right away if you are not doing well or get worse. Document Released: 09/01/2004 Document Revised: 08/14/2011 Document Reviewed: 04/03/2011 [facility] Patient Information 2014 [employer].

## 2013-08-10 NOTE — Progress Notes (Signed)
Orthopedic Tech Progress Note Patient Details:  [name] January 02, 1935 [id number] CPM applied to RLE with appropriate settings. OHF applied to bed. Footsie roll provided. CPM Right Knee CPM Right Knee: On Right Knee Flexion (Degrees): 60 Right Knee Extension (Degrees): 0   Asia R Thompson 08/10/2013, 2:54 PM

## 2013-08-10 NOTE — Progress Notes (Signed)
Utilization review completed.  

## 2013-08-10 NOTE — Anesthesia Postprocedure Evaluation (Signed)
  Anesthesia Post-op Note  [name]  Procedure(s) Performed: Procedure(s): TOTAL KNEE ARTHROPLASTY (Right)  Patient Location: PACU  Anesthesia Type:Spinal  Level of Consciousness: awake, alert  and oriented  Airway and Oxygen Therapy: Patient Spontanous Breathing  Post-op Pain: none  Post-op Assessment: Post-op Vital signs reviewed, Patient's Cardiovascular Status Stable and Respiratory Function Stable  Post-op Vital Signs: Reviewed and stable  Last Vitals:  Filed Vitals:   08/10/13 1445  BP: 125/72  Pulse: 76  Temp:   Resp: 18    Complications: No apparent anesthesia complications

## 2013-08-10 NOTE — Transfer of Care (Signed)
Immediate Anesthesia Transfer of Care Note  [name]  Procedure(s) Performed: Procedure(s): TOTAL KNEE ARTHROPLASTY (Right)  Patient Location: PACU  Anesthesia Type:Spinal and MAC combined with regional for post-op pain  Level of Consciousness: awake, alert , oriented and patient cooperative  Airway & Oxygen Therapy: Patient Spontanous Breathing and Patient connected to nasal cannula oxygen  Post-op Assessment: Report given to PACU RN and Post -op Vital signs reviewed and stable  Post vital signs: Reviewed and stable  Complications: No apparent anesthesia complications

## 2013-08-11 ENCOUNTER — Encounter (HOSPITAL_COMMUNITY): Payer: Self-pay | Admitting: Orthopedic Surgery

## 2013-08-11 LAB — BASIC METABOLIC PANEL
BUN: 14 mg/dL (ref 6–23)
CHLORIDE: 97 meq/L (ref 96–112)
CO2: 25 meq/L (ref 19–32)
CREATININE: 0.63 mg/dL (ref 0.50–1.10)
Calcium: 8.5 mg/dL (ref 8.4–10.5)
GFR calc Af Amer: >90 mL/min (ref >90)
GFR calc non Af Amer: 85 mL/min — ABNORMAL LOW (ref >90)
GLUCOSE: 135 mg/dL — AB (ref 70–99)
Potassium: 3.4 mEq/L — ABNORMAL LOW (ref 3.7–5.3)
Sodium: 135 mEq/L — ABNORMAL LOW (ref 137–147)

## 2013-08-11 LAB — CBC
HCT: 29.2 % — ABNORMAL LOW (ref 36.0–46.0)
Hemoglobin: 9.5 g/dL — ABNORMAL LOW (ref 12.0–15.0)
MCH: 27.9 pg (ref 26.0–34.0)
MCHC: 32.5 g/dL (ref 30.0–36.0)
MCV: 85.6 fL (ref 78.0–100.0)
Platelets: 177 10*3/uL (ref 150–400)
RBC: 3.41 MIL/uL — AB (ref 3.87–5.11)
RDW: 13.9 % (ref 11.5–15.5)
WBC: 7.6 10*3/uL (ref 4.0–10.5)

## 2013-08-11 MED ORDER — POTASSIUM CHLORIDE CRYS ER 10 MEQ PO TBCR
10.0000 meq | EXTENDED_RELEASE_TABLET | ORAL | Status: DC
  Administered 2013-08-11: 10 meq via ORAL
  Filled 2013-08-11: qty 1
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
## 2013-08-11 NOTE — Progress Notes (Signed)
Per RN Patient does not to wear a CPAP here at the hospital because she does not want to wear our CPAP from the hospital.

## 2013-08-11 NOTE — Op Note (Signed)
NAMEMIKAILI, FLIPPIN                 ACCOUNT NO.:  [id number]  MEDICAL RECORD NO.:  [id number]  LOCATION:  5N19C                        FACILITY:  [facility]  PHYSICIAN:  Alta Corning, M.D.   DATE OF BIRTH:  [birth year]  DATE OF PROCEDURE:  08/10/2013 DATE OF DISCHARGE:                              OPERATIVE REPORT   PREOPERATIVE DIAGNOSIS:  End-stage degenerative joint disease, right knee.  POSTOPERATIVE DIAGNOSIS:  End-stage degenerative joint disease, right knee.  PROCEDURE:  Right total knee replacement with Sigma system, size 4 narrow femur, size 4 tibia, 35-mm all polyethylene patella, and a 10-mm bridging bearing.  SURGEON:  Alta Corning, M.D.  ASSISTANT:  Gary Fleet, P.A.  ANESTHESIA:  Spinal.  BRIEF HISTORY:  Ms. Mcginnis is a 79 year old female with a history of having significant complaints of right degenerative joint disease of the knee.  She had failed all conservative care, and she was scheduled for right total knee replacement.  She had concerns about general anesthesia, wanted to have a spinal anesthetic.  She was taken off her Xarelto 2 days prior to presenting for surgery and then Anesthesia was uncomfortable doing a spinal in that scenario.  She was cancelled on Friday and moved to Monday where she would be off for 5 days which they felt more comfortable with, and she was brought to the operating room on Monday for right total knee replacement.  DESCRIPTION OF PROCEDURE:  The patient was taken to the operating room. After adequate anesthesia was obtained with the spinal anesthetic, the patient was placed supine on the operating table.  The right leg was prepped and draped in the usual sterile fashion.  Following this, the leg was exsanguinated.  Blood pressure tourniquet inflated to 350 mmHg. Following this, midline incision was made in the subcutaneous tissue, dissected down to the level of extensor mechanism and a medial parapatellar arthrotomy was  undertaken.  Following this, medial and lateral menisci were removed, retropatellar fat pad, synovium in the anterior aspect of the femur, and anterior and posterior cruciates.  At this point, the tibia was exposed and cut perpendicular to its long axis.  Intramedullary hole was then drilled in the femur and it was cut perpendicular to anatomic axis with a 5-degree valgus alignment and 10 mm of bone was resected.  Once this was done, a spacer block was put in place and a 10 spacer block went in place.  At this point, attention was turned to the femur, sized to a 4.  It did not fit as well in the AP plane and felt that 4 narrow would be appropriate, sized to a 4, and 4 block was chosen.  Anterior and posterior cuts were made, chamfer and box.  Attention was then turned to the tibia, sized to a 4, was drilled and keeled.  Trial 4s were put in place, 4 narrow on the femur, this is a very nice fit.  A 10-mm bridging bearing place was trialed and this gave easy full extension.  The lugs were drilled for the femur. Attention was turned to the patella, cut down to a level of 14 mm and a 35 paddle was chosen and the  lugs were drilled for the patella and the trial patella was put in place.  Knee was put through a range of motion. Excellent stability at this point was achieved and at this point, the trial components were all removed.  The knee was copiously and thoroughly lavaged and suctioned dry.  The final components were then cemented into place, size 4 narrow femur, size 4 tibia, 10 mm bridging bearing trial was placed, and a 35 all poly patella was placed and held with a clamp.  All excess bone cement was removed.  Then, the cement was allowed to completely harden.  At this point, tourniquet was let down. All bleeding was controlled with electrocautery.  The trial poly was removed.  Trialed to 12.5, was too tight, went with a 10 and at this point, the medium Hemovac drain was placed.  60 mL of  Exparel which was 20 mL of Exparel diluted to 60 with saline was instilled into the tissues around the knee for help control postoperative pain.  The medial parapatellar arthrotomy was closed with 1 Vicryl running, the skin with 0 and 2-0 Vicryl and 3-0 Monocryl subcuticular.  Benzoin and Steri- Strips were applied.  Sterile compressive dressing was applied.  The patient was taken to the recovery room and she was noted to be in satisfactory condition.  Estimated blood loss for the procedure was minimal.     Alta Corning, M.D.     Corliss Skains  D:  08/10/2013  T:  08/11/2013  Job:  [id number]

## 2013-08-11 NOTE — Plan of Care (Signed)
Problem: Consults Goal: Diagnosis- Total Joint Replacement Primary Total Knee     

## 2013-08-11 NOTE — Progress Notes (Signed)
Subjective: 1 Day Post-Op Procedure(s) (LRB): TOTAL KNEE ARTHROPLASTY (Right) Patient reports pain as 3 on 0-10 scale.  The patient has some nausea.  It does not seem to be related to medication.  She is taking fluids and voiding okay.  Objective: Vital signs in last 24 hours: Temp:  [97.4 F (36.3 C)-98.5 F (36.9 C)] 98.5 F (36.9 C) (06/16 0614) Pulse Rate:  [64-121] 96 (06/16 0932) Resp:  [9-24] 18 (06/16 0614) BP: (102-136)/(57-95) 113/57 mmHg (06/16 0932) SpO2:  [95 %-100 %] 95 % (06/16 0614) Weight:  [73.936 kg (163 lb)] 73.936 kg (163 lb) (06/16 0100)  Intake/Output from previous day: 06/15 0701 - 06/16 0700 In: 1400 [I.V.:1400] Out: 2075 [Urine:1550; Drains:475; Blood:50] Intake/Output this shift:     Recent Labs  08/11/13 0616  HGB 9.5*    Recent Labs  08/11/13 0616  WBC 7.6  RBC 3.41*  HCT 29.2*  PLT 177    Recent Labs  08/11/13 0616  NA 135*  K 3.4*  CL 97  CO2 25  BUN 14  CREATININE 0.63  GLUCOSE 135*  CALCIUM 8.5   No results found for this basename: LABPT, INR,  in the last 72 hours Right knee exam: Neurovascular intact Sensation intact distally Intact pulses distally Dorsiflexion/Plantar flexion intact Incision: dressing C/D/I Compartment soft  Assessment/Plan: 1 Day Post-Op Procedure(s) (LRB): TOTAL KNEE ARTHROPLASTY (Right) Plan: Drain pulled Up with therapy Discharge home with home health tomorrow. Continue Xarelto which she was taking preoperativelywith SCDs for DVT prophylaxis.  BETHUNE,JAMES G 08/11/2013, 9:55 AM

## 2013-08-11 NOTE — Progress Notes (Signed)
Clinical Social Worker received referral for possible ST-SNF placement.  Chart reviewed.  PT/OT recommending home with home health.  CSW will pass this information on to the Case Manager who will follow up with patient to discuss home health needs.    CSW signing off - please re consult if social work needs arise.  Jeanette Caprice, MSW, [place]

## 2013-08-12 LAB — CBC
HEMATOCRIT: 27.1 % — AB (ref 36.0–46.0)
Hemoglobin: 8.9 g/dL — ABNORMAL LOW (ref 12.0–15.0)
MCH: 28.1 pg (ref 26.0–34.0)
MCHC: 32.8 g/dL (ref 30.0–36.0)
MCV: 85.5 fL (ref 78.0–100.0)
PLATELETS: 148 10*3/uL — AB (ref 150–400)
RBC: 3.17 MIL/uL — ABNORMAL LOW (ref 3.87–5.11)
RDW: 13.6 % (ref 11.5–15.5)
WBC: 8.3 10*3/uL (ref 4.0–10.5)

## 2013-08-12 NOTE — Progress Notes (Signed)
Patient is discharged from room 5N19 at this time. Alert and in stable condition. IV site d/c'd. Instructions read to patient and understanding verbalized. Left unit via wheelchair with husband and belongings at side.
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
## 2013-08-12 NOTE — Progress Notes (Signed)
Subjective: 2 Days Post-Op Procedure(s) (LRB): TOTAL KNEE ARTHROPLASTY (Right) Patient reports pain as 3 on 0-10 scale.  The patient is taking by mouth and voiding okay. Good progress with physical therapy. She is ready for discharge home. Her knee dressing has been changed.  Objective: Vital signs in last 24 hours: Temp:  [99.4 F (37.4 C)-100.1 F (37.8 C)] 99.4 F (37.4 C) (06/17 0700) Pulse Rate:  [110-111] 110 (06/17 0700) Resp:  [16-18] 16 (06/17 0700) BP: (115-119)/(52-65) 115/52 mmHg (06/17 0700) SpO2:  [90 %-94 %] 94 % (06/17 0700)  Intake/Output from previous day: 06/16 0701 - 06/17 0700 In: 480 [P.O.:480] Out: -  Intake/Output this shift: Total I/O In: 240 [P.O.:240] Out: -    Recent Labs  08/11/13 0616 08/12/13 0500  HGB 9.5* 8.9*    Recent Labs  08/11/13 0616 08/12/13 0500  WBC 7.6 8.3  RBC 3.41* 3.17*  HCT 29.2* 27.1*  PLT 177 148*    Recent Labs  08/11/13 0616  NA 135*  K 3.4*  CL 97  CO2 25  BUN 14  CREATININE 0.63  GLUCOSE 135*  CALCIUM 8.5   Right knee exam: Neurovascular intact Sensation intact distally Intact pulses distally Dorsiflexion/Plantar flexion intact Incision: no drainage Compartment soft  Assessment/Plan: 2 Days Post-Op Procedure(s) (LRB): TOTAL KNEE ARTHROPLASTY (Right) Plan: Dressing changed Discharge home with home health Followup with Dr. Berenice Primas in 2 weeks. [name] Heeney G 08/12/2013, 2:00 PM
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
## 2013-08-13 NOTE — Care Management Note (Signed)
CARE MANAGEMENT NOTE 08/13/2013  Patient:  [name], [name]   Account Number:  [id number]  Date Initiated:  08/11/2013  Documentation initiated by:  Ricki Miller  Subjective/Objective Assessment:   79 yr old female s/p right total knee arthroplasty.     Action/Plan:   Case manager spoke with patient concerning home health and DME needs at discharge. Patient already has rolling walker. 3in1 and CPM will be delivered.   Anticipated DC Date:  08/12/2013   Anticipated DC Plan:  [place]  CM consult      [facility] Choice  HOME HEALTH  DURABLE MEDICAL EQUIPMENT   Choice offered to / List presented to:  C-1 Patient   DME arranged  3-N-1  CPM      DME agency  [employer]     [place] arranged  HH-2 PT      [place].   Status of service:  Completed, signed off Medicare Important Message given?  NA - LOS <3 / Initial given by admissions (If response is "NO", the following Medicare IM given date fields will be blank) Date Medicare IM given:  08/10/2013 Date Additional Medicare IM given:    Discharge Disposition:  [place]  Per UR Regulation:  Reviewed for med. necessity/level of care/duration of stay

## 2013-10-01 ENCOUNTER — Telehealth: Payer: Self-pay

## 2013-10-05 ENCOUNTER — Encounter: Payer: Self-pay | Admitting: Cardiovascular Disease

## 2013-10-05 MED ORDER — NITROGLYCERIN 0.4 MG SL SUBL: 0.4000 mg | SUBLINGUAL_TABLET | SUBLINGUAL | Status: DC | PRN

## 2013-10-05 NOTE — Telephone Encounter (Signed)
It will be OK for her to have NTG on hand

## 2013-10-05 NOTE — Telephone Encounter (Signed)
This encounter was created in error - please disregard.

## 2013-10-05 NOTE — Telephone Encounter (Signed)
Advised patient and sent to pharmacy  

## 2013-10-05 NOTE — Telephone Encounter (Signed)
Left message to call back  

## 2013-10-05 NOTE — Telephone Encounter (Signed)
Patient is returning your call. Please call back.  °

## 2013-10-31 ENCOUNTER — Other Ambulatory Visit: Payer: Self-pay | Admitting: Cardiovascular Disease

## 2013-12-19 ENCOUNTER — Other Ambulatory Visit: Payer: Self-pay | Admitting: Cardiovascular Disease

## 2013-12-23 ENCOUNTER — Telehealth: Payer: Self-pay | Admitting: *Deleted

## 2013-12-23 NOTE — Telephone Encounter (Signed)
Tried calling patient no answer. Left a message that her appt scheduled for 11-2 with cm needs to be r/s. Patient can be scheduled with MM because CM has never seen the patient before. Left message for patient to call the office back.

## 2013-12-28 ENCOUNTER — Ambulatory Visit: Payer: Medicare Other | Admitting: Nurse Practitioner

## 2013-12-28 NOTE — Telephone Encounter (Signed)
Patient scheduled with MM

## 2013-12-29 ENCOUNTER — Other Ambulatory Visit: Payer: Self-pay | Admitting: Cardiovascular Disease

## 2013-12-31 ENCOUNTER — Other Ambulatory Visit: Payer: Self-pay

## 2013-12-31 MED ORDER — RIVAROXABAN 20 MG PO TABS: 20.0000 mg | ORAL_TABLET | Freq: Every day | ORAL | Status: DC

## 2013-12-31 NOTE — Telephone Encounter (Signed)
Refill sent for xarelto 20

## 2014-01-24 ENCOUNTER — Other Ambulatory Visit: Payer: Self-pay | Admitting: Cardiovascular Disease

## 2014-01-28 ENCOUNTER — Encounter: Payer: Self-pay | Admitting: Cardiovascular Disease

## 2014-01-28 ENCOUNTER — Ambulatory Visit (INDEPENDENT_AMBULATORY_CARE_PROVIDER_SITE_OTHER): Payer: Medicare Other | Admitting: Cardiovascular Disease

## 2014-01-28 VITALS — BP 126/70 | HR 92 | Ht 64.0 in | Wt 156.5 lb

## 2014-01-28 DIAGNOSIS — I482 Chronic atrial fibrillation, unspecified: Secondary | ICD-10-CM

## 2014-01-28 DIAGNOSIS — I10 Essential (primary) hypertension: Secondary | ICD-10-CM

## 2014-01-28 MED ORDER — METOPROLOL SUCCINATE ER 25 MG PO TB24: 25.0000 mg | ORAL_TABLET | Freq: Every day | ORAL | Status: DC

## 2014-01-28 NOTE — Progress Notes (Signed)
[name] Date of Birth  [birth year] [place] [address]    [place], [place]  [address]    [place], [place]  [phone]  Fax  [phone]  [phone]  Fax [phone]  Problem List: 1. Small NSTEMI by cardiac enzymes but minimal CAD by cath 2. LVH 3.HTN 4. Hyperlipidemia 5.  Hypokalemia 6. Atrial Fibrillation  History of Present Illness:  Noriah is doing fairly well. She is walking and exercising some. She notes that her heart rate is still quite variable. She has a hx of  PAF but typically does not notice if her HR is irregular.  She continues to have fatigue.  We tried cutting her atenolol to 25 mg daily which may have helped.    Dec. 2, 2014:  Yasmine is doing well.   No specific cardiac issues.  Occasionally has some dyspnea.   She is more fatigued than she would like to be.  She has had a sleep study and is being considered for CPAP.  She has had some bleeding in her stool - just occult bleeding.  GI work up was negative.    She cannot tell when she is in A-fib or NSR  Dec. 3, 2015: She is seen for follow up of her atrial fib.  . Fatigue seems to be better off the atenolol.     Current Outpatient Prescriptions on File Prior to Visit  Medication Sig Dispense Refill  . alendronate (FOSAMAX) 70 MG tablet Take 70 mg by mouth once a week. Every Wednesdays    . amLODipine (NORVASC) 5 MG tablet TAKE 1 TABLET EVERY DAY 30 tablet 11  . atenolol (TENORMIN) 25 MG tablet Take 25 mg by mouth daily.    [name] atorvastatin (LIPITOR) 20 MG tablet Take 20 mg by mouth daily.     [name] CALCIUM-VITAMIN D PO Take 1 tablet by mouth daily.    [name] doxazosin (CARDURA) 4 MG tablet TAKE 1 TABLET BY MOUTH EVERY DAY AT BEDTIME 30 tablet 5  . esomeprazole (NEXIUM) 40 MG capsule Take 40 mg by mouth 4 (four) times a week.    . ezetimibe (ZETIA) 10 MG tablet Take 5 mg by mouth daily.     . hydrochlorothiazide (MICROZIDE) 12.5 MG  capsule Take 1 capsule (12.5 mg total) by mouth 2 (two) times daily. 180 capsule 3  . KLOR-CON M10 10 MEQ tablet Take 5 mEq by mouth daily.     . Multiple Vitamin (MULTIVITAMIN) tablet Take 1 tablet by mouth daily.      . Multiple Vitamins-Minerals (ICAPS) CAPS Take 1 capsule by mouth daily.    . nitroGLYCERIN (NITROSTAT) 0.4 MG SL tablet Place 1 tablet (0.4 mg total) under the tongue every 5 (five) minutes as needed for chest pain. 25 tablet prn  . Nutritional Supplements (CHOICE DM FIBER-BURST PO) Take by mouth daily.     . Omega 3 1200 MG CAPS Take 1 capsule by mouth daily.    . rivaroxaban (XARELTO) 20 MG TABS tablet Take 1 tablet (20 mg total) by mouth daily. 30 tablet 6  . valsartan (DIOVAN) 160 MG tablet Take 1 tablet (160 mg total) by mouth 2 (two) times daily. 180 tablet 3  . cholecalciferol (VITAMIN D) 1000 UNITS tablet Take 1,000 Units by mouth daily.      . methocarbamol (ROBAXIN-750) 500 MG tablet Take 1.5 tablets (750 mg total) by mouth every  8 (eight) hours as needed for muscle spasms. (Patient not taking: Reported on 01/28/2014) 40 tablet 0  . oxyCODONE-acetaminophen (PERCOCET/ROXICET) 5-325 MG per tablet Take 1-2 tablets by mouth every 6 (six) hours as needed for severe pain. (Patient not taking: Reported on 01/28/2014) 50 tablet 0  . zolpidem (AMBIEN CR) 6.25 MG CR tablet Take 1 tablet (6.25 mg total) by mouth at bedtime as needed for sleep. (Patient not taking: Reported on 01/28/2014) 30 tablet 0   No current facility-administered medications on file prior to visit.    Allergies  Allergen Reactions  . Pradaxa [Dabigatran Etexilate Mesylate] Nausea Only  . Tranexamic Acid     Patient ineligible for Tranexamic acid due to hx of NSTEMI and CAD.    Past Medical History  Diagnosis Date  . MI, acute, non ST segment elevation Dec.7,2011    cath  . Coronary artery disease     minimal  . Hypertension   . Hyperlipidemia   . Hypokalemia   . Incontinence of urine   . Stromal  tumor of the stomach     cancer  . Atrial fibrillation   . Arthritis   . GERD (gastroesophageal reflux disease)   . Hiatal hernia   . Osteopenia   . Bladder polyps   . IBS (irritable bowel syndrome)   . Cervical polyp   . Cataracts, bilateral   . IFG (impaired fasting glucose)   . OSA (obstructive sleep apnea)     hypoxia , not CO 2 retainer,   . OSA on CPAP 06/16/2013     Sleep study 1- 11-15 with an AHI of 61 , now titrated to CPAP 9 cm water with 2 cm EPR.   . Cancer     stomach  . Dysrhythmia      AF  hx     dr Acie Fredrickson  . Chronic kidney disease     urinary incont-    Past Surgical History  Procedure Laterality Date  . Stromal stomach  tumor  03/26/2007    resection  . Ankle fracture surgery  2006    right, plate   . Cardiac catheterization  12/07/20210    Dr.Nahser,minor cad  . Eye surgery      both cataracts  . Colonoscopy    . Upper gi endoscopy    . Knee arthroscopy Right 05/28/2012    Procedure: RIGHT KNEE ARTHROSCOPY PARTIAL MEDIAL AND LATERAL MENISECTOMY WITH CHONDROPLASTY;  Surgeon: Alta Corning, MD;  Location: [facility];  Service: Orthopedics;  Laterality: Right;  . Total knee arthroplasty Right 08/10/2013    DR GRAVES  . Total knee arthroplasty Right 08/10/2013    Procedure: TOTAL KNEE ARTHROPLASTY;  Surgeon: Alta Corning, MD;  Location: [facility];  Service: Orthopedics;  Laterality: Right;    History  Smoking status  . Never Smoker   Smokeless tobacco  . Never Used    History  Alcohol Use  . 1.2 oz/week  . 2 Glasses of wine per week    Comment: one per week    Family History  Problem Relation Age of Onset  . Pancreatic cancer Paternal Grandmother   . Diabetes Paternal Aunt   . Heart disease Paternal Grandfather     both sides of the family    Reviw of Systems:  Reviewed in the HPI.  All other systems are negative.  Physical Exam: Blood pressure 126/70, pulse 92, height 5\' 4"  (1.626 m), weight 156 lb 8 oz (70.988  kg). General:  Well developed, well nourished, in no acute distress.  Head: Normocephalic, atraumatic, sclera non-icteric, mucus membranes are moist,   Neck: Supple. Negative for carotid bruits. JVD not elevated.  Lungs: Clear bilaterally to auscultation without wheezes, rales, or rhonchi. Breathing is unlabored.  Heart: Irreg. Irreg.  with S1 S2. No murmurs, rubs, or gallops appreciated.  Abdomen: Soft, non-tender, non-distended with normoactive bowel sounds. No hepatomegaly. No rebound/guarding. No obvious abdominal masses.  Msk:  Strength and tone appear normal for age.  Extremities: No clubbing or cyanosis. No edema.  Distal pedal pulses are 2+ and equal bilaterally.  Neuro: Alert and oriented X 3. Moves all extremities spontaneously.  Psych:  Responds to questions appropriately with a normal affect.  ECG: Dec. 3, 2015:  Atrial fib at rate if 92. No ST or T wave changes Assessment / Plan:

## 2014-01-28 NOTE — Assessment & Plan Note (Signed)
[name] is doing well. She was having some fatigue and seemed to improve as we decreased the atenolol dose. Her rate however has increased a little bit when what would like.  We will try her on Toprol-XL 25 mg a day and see if that helps control her rate better while hopefully avoiding severe fatigue.  I'll see her again in one year. I've advised her to call me sooner if she has  fatigue or an abnormal HR.

## 2014-01-28 NOTE — Patient Instructions (Addendum)
Your physician has recommended you make the following change in your medication:  START Toprol XL 25 mg once daily STOP Atenolol   Your physician wants you to follow-up in: 1 year with Dr. Acie Fredrickson.  You will receive a reminder letter in the mail two months in advance. If you don't receive a letter, please call our office to schedule the follow-up appointment.

## 2014-02-10 ENCOUNTER — Ambulatory Visit (INDEPENDENT_AMBULATORY_CARE_PROVIDER_SITE_OTHER): Payer: Medicare Other | Admitting: Adult Health

## 2014-02-10 ENCOUNTER — Encounter: Payer: Self-pay | Admitting: Adult Health

## 2014-02-10 ENCOUNTER — Encounter: Payer: Self-pay | Admitting: Neurology

## 2014-02-10 VITALS — BP 131/72 | HR 94 | Temp 98.2°F | Ht 64.0 in | Wt 156.0 lb

## 2014-02-10 DIAGNOSIS — R413 Other amnesia: Secondary | ICD-10-CM

## 2014-02-10 DIAGNOSIS — Z9989 Dependence on other enabling machines and devices: Principal | ICD-10-CM

## 2014-02-10 DIAGNOSIS — G4733 Obstructive sleep apnea (adult) (pediatric): Secondary | ICD-10-CM

## 2014-02-10 NOTE — Progress Notes (Signed)
[name] DOB: 11-Aug-1934  REASON FOR VISIT: follow up HISTORY FROM: patient  HISTORY OF PRESENT ILLNESS: [name] is a 79 year old female with history of obstructive sleep Apnea. She returns today for a 90 day compliance download. She brought her machine with her today and the reports shows an AHI of 0.6 at 9 cm of water with EPR 2, uses her machine for  6 hours 49 minutes a night, with 87% compliance.   Patient reports that she gets about  6-7 hours of sleep a night. She goes to bed around 10:30-11:30 PM and arises at 7:30. She denies having trouble falling asleep or staying a sleep. States that the she gets up about 3 times a night to urinate. Since the last visit the patient has had no new medical issues. The patient states that she has been having some memory issues. She notices that she can't remember street names and has been living in [place] for years. She can read a book but not be able to tell someone else about it. She has trouble with people's names as well. She does still operate a motor vehicle but has not gotten lost. She is able to complete all ADLs without difficulty. She cooks at home without difficulty.  HISTORY 06/16/13 [facility]): Underwent a polysomnographic study on 01-01-12. She had presented with dyspnea on exertion some chest pain memory loss incontinence and nocturia IBS fatigue but not necessarily a high degree of Epworth sleepiness. Dr. Lawanda Cousins referred the patient following a normal pulse oximetry which showed hypoxemia. The sleep study showed that the patient indeed had frequent apneas her AHI was 46.8 RDI 51.2 there was milligrams 16 to all events were current in non-REM sleep and for a significantly accelerated during supine sleep with an AHI of 66.2. The patient's oxygen saturation was its lowest at 86% at night. She stayed desaturated was 17.3 minutes which is not a very long time was a conative sleep time. The technician appropriately placed the  patient on CPAP at night but 3 different marks were interfaces were tried and could not be tolerated by the patient the patient did awake for over 3 hours and finally would only only go back to sleep without any interface. Intended to send the patient for a desensitization. The patient states today that she never received the results and that a desensitization was therefore not initiated. I explained to her that a CPAP based on a study but could not sure the benefit of CPAP would be would not have been covered in 2013. Important as also that she had mainly hypotony and is sore her apnea is obstructive in nature. My plan for the patient is to use different interfaces at home and attached to a CPAP machine to get used to wearing them. If this can be achieved I would then like for her to be titrated. This could be done in the sleep lab or up as an auto titration , the according machine can be ordered for a month.The patient describes her sleep time habits as follows she would go to bed between 10 and 11 PM some nights of take 30 minutes some nights is listening to books at night. Pulse would not keep her from falling asleep there is no late in the dense. She does not use a sleep aid at home. She wakes up between 3 and 6 times at night to go to the bathroom. Overall sleep time estimated subjectively at 6 hours. She  will rise in the morning between 7:30 and 8 AM, specie break up spontaneously on days where she may have an early appointment she could use a clock radio.She does deny to have any morning headaches, he wakes up with a very dry mouth. She will notice a dry mouth even during her bathroom break. There is no nocturnal diaphoresis, no palpitations that she is aware of.Do feel a cold during daytime but not at night. She will drink decaffeinated beverages only decaffeinated coffee in the morning decaffeinated tea or sodas during the afternoons.She drinks rarely alcohol, she does not smoke and is not exposed  to passive smoke. She has no history of shift working, and her 10 days in retirement allow a regular sleep/wake times. There is no family history of sleep disorders.   Today, on 06-16-13, [name] is here for a revisit. In the meantime she has undergone annual split night polysomnography. The date of the study was 03-08-13. Her Epworth sleepiness score was endorsed at 11 points, she tested again positive for sleep apnea with an AHI of 61.7 and an RDI of 69.7. The lowest oxygen saturation was 85% with a total of 27.9 minutes of desaturation. She had fewer periodic limb movements with arousal index of 4.6 per hour she also had atrial fibrillation with irregular heart beat is green she was attached to the original study. She was titrated to 9 cm water with 2 cm EPR and has done very well at the setting.  Today's download is reviewed for a period of 30 days, her compliance is 97%. She missed one single day of CPAP use 2 to a rhinitis and nasal congestion. The residual AHI is 2.0, she has no central apneas, the setting of still 9 cm water. She uses her CPAP for 7 hours and 27 minutes nightly on average.  The machine is quiet and she has just started to travel with it. The mask is comfortable and leaves no pressure marks.All medication reviewed. No recent surgeries or hospitalization. She underwent an arthroscopic right knee surgery in May 2014 .    REVIEW OF SYSTEMS: Out of a complete 14 system review of symptoms, the patient complains only of the following symptoms, and all other reviewed systems are negative.  Incontinence of bladder Memory loss Cold intolerance  ALLERGIES: Allergies  Allergen Reactions  . Pradaxa [Dabigatran Etexilate Mesylate] Nausea Only  . Tranexamic Acid     Patient ineligible for Tranexamic acid due to hx of NSTEMI and CAD.    HOME MEDICATIONS: Outpatient Prescriptions Prior to Visit  Medication Sig Dispense Refill  . alendronate (FOSAMAX) 70 MG tablet Take 70 mg  by mouth once a week. Every Wednesdays    . amLODipine (NORVASC) 5 MG tablet TAKE 1 TABLET EVERY DAY 30 tablet 11  . atorvastatin (LIPITOR) 20 MG tablet Take 20 mg by mouth daily.     [name] CALCIUM-VITAMIN D PO Take 1 tablet by mouth daily.    . cholecalciferol (VITAMIN D) 1000 UNITS tablet Take 1,000 Units by mouth daily.      [name] doxazosin (CARDURA) 4 MG tablet TAKE 1 TABLET BY MOUTH EVERY DAY AT BEDTIME 30 tablet 5  . esomeprazole (NEXIUM) 40 MG capsule Take 40 mg by mouth 4 (four) times a week.    . ezetimibe (ZETIA) 10 MG tablet Take 5 mg by mouth daily.     . hydrochlorothiazide (MICROZIDE) 12.5 MG capsule Take 1 capsule (12.5 mg total) by mouth 2 (two) times daily. 180 capsule 3  .  KLOR-CON M10 10 MEQ tablet Take 5 mEq by mouth daily.     . methocarbamol (ROBAXIN-750) 500 MG tablet Take 1.5 tablets (750 mg total) by mouth every 8 (eight) hours as needed for muscle spasms. 40 tablet 0  . metoprolol succinate (TOPROL XL) 25 MG 24 hr tablet Take 1 tablet (25 mg total) by mouth daily. 31 tablet 11  . Multiple Vitamin (MULTIVITAMIN) tablet Take 1 tablet by mouth daily.      . Multiple Vitamins-Minerals (ICAPS) CAPS Take 1 capsule by mouth daily.    . nitroGLYCERIN (NITROSTAT) 0.4 MG SL tablet Place 1 tablet (0.4 mg total) under the tongue every 5 (five) minutes as needed for chest pain. 25 tablet prn  . Nutritional Supplements (CHOICE DM FIBER-BURST PO) Take by mouth daily.     . Omega 3 1200 MG CAPS Take 1 capsule by mouth daily.    [name] oxyCODONE-acetaminophen (PERCOCET/ROXICET) 5-325 MG per tablet Take 1-2 tablets by mouth every 6 (six) hours as needed for severe pain. 50 tablet 0  . rivaroxaban (XARELTO) 20 MG TABS tablet Take 1 tablet (20 mg total) by mouth daily. 30 tablet 6  . valsartan (DIOVAN) 160 MG tablet Take 1 tablet (160 mg total) by mouth 2 (two) times daily. 180 tablet 3  . zolpidem (AMBIEN CR) 6.25 MG CR tablet Take 1 tablet (6.25 mg total) by mouth at bedtime as needed for sleep. 30  tablet 0   No facility-administered medications prior to visit.    PAST MEDICAL HISTORY: Past Medical History  Diagnosis Date  . MI, acute, non ST segment elevation Dec.7,2011    cath  . Coronary artery disease     minimal  . Hypertension   . Hyperlipidemia   . Hypokalemia   . Incontinence of urine   . Stromal tumor of the stomach     cancer  . Atrial fibrillation   . Arthritis   . GERD (gastroesophageal reflux disease)   . Hiatal hernia   . Osteopenia   . Bladder polyps   . IBS (irritable bowel syndrome)   . Cervical polyp   . Cataracts, bilateral   . IFG (impaired fasting glucose)   . OSA (obstructive sleep apnea)     hypoxia , not CO 2 retainer,   . OSA on CPAP 06/16/2013     Sleep study 1- 11-15 with an AHI of 61 , now titrated to CPAP 9 cm water with 2 cm EPR.   . Cancer     stomach  . Dysrhythmia      AF  hx     dr Acie Fredrickson  . Chronic kidney disease     urinary incont-    PAST SURGICAL HISTORY: Past Surgical History  Procedure Laterality Date  . Stromal stomach  tumor  03/26/2007    resection  . Ankle fracture surgery  2006    right, plate   . Cardiac catheterization  12/07/20210    Dr.Nahser,minor cad  . Eye surgery      both cataracts  . Colonoscopy    . Upper gi endoscopy    . Knee arthroscopy Right 05/28/2012    Procedure: RIGHT KNEE ARTHROSCOPY PARTIAL MEDIAL AND LATERAL MENISECTOMY WITH CHONDROPLASTY;  Surgeon: Alta Corning, MD;  Location: [facility];  Service: Orthopedics;  Laterality: Right;  . Total knee arthroplasty Right 08/10/2013    DR GRAVES  . Total knee arthroplasty Right 08/10/2013    Procedure: TOTAL KNEE ARTHROPLASTY;  Surgeon: Alta Corning,  MD;  Location: [facility];  Service: Orthopedics;  Laterality: Right;    FAMILY HISTORY: Family History  Problem Relation Age of Onset  . Pancreatic cancer Paternal Grandmother   . Diabetes Paternal Aunt   . Heart disease Paternal Grandfather     both sides of the family    SOCIAL  HISTORY: History   Social History  . Marital Status: Married    Spouse Name: Joneen Boers    Number of Children: 2  . Years of Education: Masters   Occupational History  . retired    Social History Main Topics  . Smoking status: Never Smoker   . Smokeless tobacco: Never Used  . Alcohol Use: 1.2 oz/week    2 Glasses of wine per week     Comment: one per week  . Drug Use: No  . Sexual Activity: No   Other Topics Concern  . Not on file   Social History Narrative   Patient is married Joneen Boers) and lives at home with her husband.   Patient is retired.   Patient has a Oceanographer in Education   Patient is right-handed.   Patient does not drink any caffeine.   Patient has one living child and one child is deceased.      PHYSICAL EXAM  Filed Vitals:   02/10/14 1316  BP: 131/72  Pulse: 94  Temp: 98.2 F (36.8 C)  TempSrc: Oral  Height: 5\' 4"  (1.626 m)  Weight: 156 lb (70.761 kg)   Body mass index is 26.76 kg/(m^2).  Generalized: Well developed, in no acute distress Neck: Circumference 15 inches, Mallampati 3+   Neurological examination  Mentation: Alert oriented to time, place, history taking. Follows all commands speech and language fluent. MMSE is 28/30, MOCA 24/30. Cranial nerve II-XII: Pupils were equal round reactive to light. Extraocular movements were full, visual field were full on confrontational test. Facial sensation and strength were normal. Uvula tongue midline. Head turning and shoulder shrug  were normal and symmetric. Motor: The motor testing reveals 5 over 5 strength of all 4 extremities. Good symmetric motor tone is noted throughout.  Sensory: Sensory testing is intact to soft touch on all 4 extremities. No evidence of extinction is noted.  Coordination: Cerebellar testing reveals good finger-nose-finger and heel-to-shin bilaterally.  Gait and station: Gait is normal. Tandem gait is normal. Romberg is negative. No drift is seen.  Reflexes: Deep tendon reflexes  are symmetric and normal bilaterally.     DIAGNOSTIC DATA (LABS, IMAGING, TESTING) - I reviewed patient records, labs, notes, testing and imaging myself where available.  Lab Results  Component Value Date   WBC 8.3 08/12/2013   HGB 8.9* 08/12/2013   HCT 27.1* 08/12/2013   MCV 85.5 08/12/2013   PLT 148* 08/12/2013      Component Value Date/Time   NA 135* 08/11/2013 0616   NA 138 11/22/2011 1430   K 3.4* 08/11/2013 0616   K 3.4* 11/22/2011 1430   CL 97 08/11/2013 0616   CL 100 11/22/2011 1430   CO2 25 08/11/2013 0616   CO2 26 11/22/2011 1430   GLUCOSE 135* 08/11/2013 0616   GLUCOSE 103* 11/22/2011 1430   BUN 14 08/11/2013 0616   BUN 20.0 11/22/2011 1430   CREATININE 0.63 08/11/2013 0616   CREATININE 1.0 11/22/2011 1430   CALCIUM 8.5 08/11/2013 0616   CALCIUM 10.0 11/22/2011 1430   PROT 7.0 08/07/2013 0835   PROT 6.8 11/22/2011 1430   ALBUMIN 3.7 08/07/2013 0835   ALBUMIN 3.9 11/22/2011  1430   AST 18 08/07/2013 0835   AST 19 11/22/2011 1430   ALT 8 08/07/2013 0835   ALT 10 11/22/2011 1430   ALKPHOS 62 08/07/2013 0835   ALKPHOS 47 11/22/2011 1430   BILITOT 0.7 08/07/2013 0835   BILITOT 0.90 11/22/2011 1430   GFRNONAA 85* 08/11/2013 0616   GFRAA >90 08/11/2013 0616   Lab Results  Component Value Date   CHOL 123 10/30/2011   HDL 56.60 10/30/2011   LDLCALC 59 10/30/2011   TRIG 38.0 10/30/2011   CHOLHDL 2 10/30/2011      ASSESSMENT AND PLAN 79 y.o. year old female  has a past medical history of MI, acute, non ST segment elevation (Dec.7,2011); Coronary artery disease; Hypertension; Hyperlipidemia; Hypokalemia; Incontinence of urine; Stromal tumor of the stomach; Atrial fibrillation; Arthritis; GERD (gastroesophageal reflux disease); Hiatal hernia; Osteopenia; Bladder polyps; IBS (irritable bowel syndrome); Cervical polyp; Cataracts, bilateral; IFG (impaired fasting glucose); OSA (obstructive sleep apnea); OSA on CPAP (06/16/2013); Cancer; Dysrhythmia; and Chronic  kidney disease. here with:  1. Obstructive sleep apnea on CPAP 2. Memory deficit  The patient CPAP download is excellent today. She had 87% compliance however she missed using it for 4 days due being out of town. Her AHI is 0.6. The patient's memory has remained relatively stable. Her MMSE is 27/30 and her MOCA is 24/30. We will continue to monitor this over time. In the future the patient may be considered for memory medication. If the patient's symptoms worsen or she develops new symptoms she should let us know. Otherwise she'll follow up in 6 months.    Ward Givens, MSN, NP-C 02/10/2014, 1:30 PM [facility] [address] [place], [place] [address] [phone]  Note: This document was prepared with digital dictation and possible smart phrase technology. Any transcriptional errors that result from this process are unintentional.

## 2014-02-10 NOTE — Patient Instructions (Signed)
Continue to use CPAP machine.  We will continue to check/ monitor your memory at the next visit.  If your symptoms worsen or you develop new symptoms please let us know.

## 2014-02-10 NOTE — Progress Notes (Signed)
I agree with the assessment and plan as directed by NP .The patient is known to me .   Orvil Faraone, MD  

## 2014-03-11 ENCOUNTER — Encounter (HOSPITAL_COMMUNITY): Payer: Self-pay | Admitting: Orthopedic Surgery

## 2014-04-26 ENCOUNTER — Other Ambulatory Visit: Payer: Self-pay | Admitting: Cardiovascular Disease

## 2014-05-04 ENCOUNTER — Telehealth: Payer: Self-pay | Admitting: Cardiovascular Disease

## 2014-05-04 NOTE — Telephone Encounter (Signed)
Spoke with patient who reports feeling extremely exhausted when stepping out of shower this morning at approximately 1115; states she sat down and rested in the bathroom for a few minutes   States she then attempted to go around corner to closet to put robe on and as she reached for robe she felt like she was going down; states went down to the ground but did not faint and was able to get herself down safely Patient states she laid on floor for a little while then felt the urge to vomit; vomited x 1 States after resting awhile, she was able to get up and move around the house slowly and without difficulty Denies loss of consciousness, memory or any other motor functions; states she called her husband who came home and took her BP -  120/57 at 1400; patient does not know heart rate Patient reports she had taken Valsartan 160 mg, HCTZ, Amlodipine, Toprol this morning approximately 2 hours before this episode Patient reports she is feeling well now; states just concerned that this event occurred and would like to be evaluated tomorrow  Also reports recently taking Coricidin recently for sneezing States she has never been able to tell when her heart is in atrial fibrillation; states she is uncertain as to whether her heart rate is irregular at present I advised patient that Dr. Acie Fredrickson is not in the office again until Friday and scheduled her to see Tarri Fuller, PA-C tomorrow at 74.  Patient states she will rest today and will have her husband drive her tomorrow.

## 2014-05-04 NOTE — Telephone Encounter (Signed)
New message       Talk to the nurse regarding an "episode" she had earlier today

## 2014-05-05 ENCOUNTER — Ambulatory Visit (INDEPENDENT_AMBULATORY_CARE_PROVIDER_SITE_OTHER): Payer: Medicare Other | Admitting: Physician Assistant

## 2014-05-05 ENCOUNTER — Encounter: Payer: Self-pay | Admitting: Physician Assistant

## 2014-05-05 VITALS — BP 140/86 | HR 86 | Ht 64.0 in | Wt 156.2 lb

## 2014-05-05 DIAGNOSIS — I482 Chronic atrial fibrillation, unspecified: Secondary | ICD-10-CM

## 2014-05-05 DIAGNOSIS — R55 Syncope and collapse: Secondary | ICD-10-CM

## 2014-05-05 DIAGNOSIS — I2510 Atherosclerotic heart disease of native coronary artery without angina pectoris: Secondary | ICD-10-CM

## 2014-05-05 DIAGNOSIS — E785 Hyperlipidemia, unspecified: Secondary | ICD-10-CM

## 2014-05-05 DIAGNOSIS — I10 Essential (primary) hypertension: Secondary | ICD-10-CM

## 2014-05-05 NOTE — Assessment & Plan Note (Signed)
Continue statin and zetia ?

## 2014-05-05 NOTE — Patient Instructions (Signed)
Your physician has recommended that you wear an event monitor for 2 weeks. Event monitors are medical devices that record the heart's electrical activity. Doctors most often [place] these monitors to diagnose arrhythmias. Arrhythmias are problems with the speed or rhythm of the heartbeat. The monitor is a small, portable device. You can wear one while you do your normal daily activities. This is usually used to diagnose what is causing palpitations/syncope (passing out).  Your physician recommends that you schedule a follow-up appointment in: 3-4 weeks with Dr. Acie Fredrickson  Your physician recommends that you continue on your current medications as directed. Please refer to the Current Medication list given to you today.

## 2014-05-05 NOTE — Progress Notes (Signed)
Patient ID: [name], female   DOB: [birth year], 80 y.o.   MRN: [id number]    Date:  05/05/2014   ID:  [name], DOB 1934/11/12, MRN [id number]  PCP:  Jerlyn Ly, MD  Primary Cardiologist:  Nahser   CC:  Generalized weakness   History of Present Illness: [name] is a 80 y.o. female  With a history of chronic afib, OSA, HTN, HLD, CAD, MI 2011, CKD.  Yesterday around 11 AM the patient was taking a hot shower.  She got out of the shower and when reaching for her robe in the closet she developed a "sinking feeling".  Her knees got weak.  She sat down on the floor where she developed nausea and vomiting once.  She remained there for about 20-30 minutes before getting up.  She feels as though she has returned to baseline although she has had some fatigue for the last few months.  Her atenolol was changed to metoprolol during the [place].    The patient currently denies fever, chest pain, shortness of breath, orthopnea, PND, cough, congestion, abdominal pain, hematochezia, melena, lower extremity edema, claudication.  Wt Readings from Last 3 Encounters:  05/05/14 156 lb 3.2 oz (70.852 kg)  02/10/14 156 lb (70.761 kg)  01/28/14 156 lb 8 oz (70.988 kg)     Past Medical History  Diagnosis Date  . MI, acute, non ST segment elevation Dec.7,2011    cath  . Coronary artery disease     minimal  . Hypertension   . Hyperlipidemia   . Hypokalemia   . Incontinence of urine   . Stromal tumor of the stomach     cancer  . Atrial fibrillation   . Arthritis   . GERD (gastroesophageal reflux disease)   . Hiatal hernia   . Osteopenia   . Bladder polyps   . IBS (irritable bowel syndrome)   . Cervical polyp   . Cataracts, bilateral   . IFG (impaired fasting glucose)   . OSA (obstructive sleep apnea)     hypoxia , not CO 2 retainer,   . OSA on CPAP 06/16/2013     Sleep study 1- 11-15 with an AHI of 61 , now titrated to CPAP 9 cm water with 2 cm EPR.   . Cancer     stomach  .  Dysrhythmia      AF  hx     dr Acie Fredrickson  . Chronic kidney disease     urinary incont-    Current Outpatient Prescriptions  Medication Sig Dispense Refill  . alendronate (FOSAMAX) 70 MG tablet Take 70 mg by mouth once a week. Every Wednesdays    . amLODipine (NORVASC) 5 MG tablet TAKE 1 TABLET EVERY DAY 30 tablet 11  . amoxicillin (AMOXIL) 500 MG capsule Take 500 mg by mouth as needed. TAKE 4 CAPSULES 30 MINUTES BEFORE DENTAL PROCEDURES  1  . atorvastatin (LIPITOR) 20 MG tablet Take 20 mg by mouth daily.     [name] CALCIUM-VITAMIN D PO Take 1 tablet by mouth daily.    [name] doxazosin (CARDURA) 4 MG tablet TAKE 1 TABLET BY MOUTH EVERY DAY AT BEDTIME 30 tablet 5  . ezetimibe (ZETIA) 10 MG tablet Take 5 mg by mouth daily.     . hydrochlorothiazide (MICROZIDE) 12.5 MG capsule TAKE 1 CAPSULE (12.5 MG TOTAL) BY MOUTH 2 (TWO) TIMES DAILY. 180 capsule 3  . KLOR-CON M10 10 MEQ tablet Take 5 mEq by mouth daily.     [name]  metoprolol succinate (TOPROL XL) 25 MG 24 hr tablet Take 1 tablet (25 mg total) by mouth daily. 31 tablet 11  . Multiple Vitamin (MULTIVITAMIN) tablet Take 1 tablet by mouth daily.      . nitroGLYCERIN (NITROSTAT) 0.4 MG SL tablet Place 1 tablet (0.4 mg total) under the tongue every 5 (five) minutes as needed for chest pain. 25 tablet prn  . Nutritional Supplements (CHOICE DM FIBER-BURST PO) Take by mouth daily.     . Omega 3 1200 MG CAPS Take 1 capsule by mouth daily.    [name] oxyCODONE-acetaminophen (PERCOCET/ROXICET) 5-325 MG per tablet Take 1-2 tablets by mouth every 6 (six) hours as needed for severe pain. 50 tablet 0  . rivaroxaban (XARELTO) 20 MG TABS tablet Take 1 tablet (20 mg total) by mouth daily. 30 tablet 6  . valsartan (DIOVAN) 160 MG tablet TAKE 1 TABLET BY MOUTH TWICE A DAY 180 tablet 3   No current facility-administered medications for this visit.    Allergies:    Allergies  Allergen Reactions  . Pradaxa [Dabigatran Etexilate Mesylate] Nausea Only  . Tranexamic Acid     Patient  ineligible for Tranexamic acid due to hx of NSTEMI and CAD.    Social History:  The patient  reports that she has never smoked. She has never used smokeless tobacco. She reports that she drinks about 1.2 oz of alcohol per week. She reports that she does not use illicit drugs.   Family history:   Family History  Problem Relation Age of Onset  . Pancreatic cancer Paternal Grandmother   . Diabetes Paternal Aunt   . Heart disease Paternal Grandfather     both sides of the family    ROS:  Please see the history of present illness.  All other systems reviewed and negative.   PHYSICAL EXAM: VS:  BP 140/86 mmHg  Pulse 86  Ht 5\' 4"  (1.626 m)  Wt 156 lb 3.2 oz (70.852 kg)  BMI 26.80 kg/m2 Well nourished, well developed, in no acute distress HEENT: Pupils are equal round react to light accommodation extraocular movements are intact.  Neck: no JVDNo cervical lymphadenopathy. Cardiac: Regular rate and rhythm without murmurs rubs or gallops. Lungs:  clear to auscultation bilaterally, no wheezing, rhonchi or rales Abd: soft, nontender, positive bowel sounds all quadrants, no hepatosplenomegaly Ext: no lower extremity edema.  2+ radial and dorsalis pedis pulses. Skin: warm and dry Neuro:  Grossly normal. CN 2-12 intact  EKG:  Atrial fibrillation with a rate of 86 bpm  ASSESSMENT AND PLAN:  Problem List Items Addressed This Visit    Pre-syncope     This episode the patient describes sounds like a vagal mediated event.   She just got out of a hot shower.   We'll put an event monitor on her for 2 weeks.   Currently she is in atrial fibrillation, which is chronic. Rate is 86 bpm.      Relevant Orders   EKG 12-Lead   Cardiac event monitor   Essential hypertension     Blood pressure is mildly elevated at this time. No changes      Dyslipidemia    Continue statin and zetia      Coronary artery disease   Atrial fibrillation - Primary   Relevant Orders   EKG 12-Lead

## 2014-05-05 NOTE — Assessment & Plan Note (Signed)
This episode the patient describes sounds like a vagal mediated event.   She just got out of a hot shower.   We'll put an event monitor on her for 2 weeks.   Currently she is in atrial fibrillation, which is chronic. Rate is 86 bpm.

## 2014-05-05 NOTE — Assessment & Plan Note (Signed)
Blood pressure is mildly elevated at this time. No changes

## 2014-05-05 NOTE — Telephone Encounter (Signed)
I note that she is scheduled to see Tarri Fuller, PA today Her symptoms sound more like a GI issue than cardiac

## 2014-05-10 ENCOUNTER — Encounter (INDEPENDENT_AMBULATORY_CARE_PROVIDER_SITE_OTHER): Payer: Medicare Other

## 2014-05-10 ENCOUNTER — Encounter: Payer: Self-pay | Admitting: *Deleted

## 2014-05-10 DIAGNOSIS — R55 Syncope and collapse: Secondary | ICD-10-CM

## 2014-05-10 NOTE — Progress Notes (Signed)
Patient ID: [name], female   DOB: [birth year], 80 y.o.   MRN: [id number] [employer] 14 day cardiac event monitor applied to patient.

## 2014-05-12 ENCOUNTER — Telehealth: Payer: Self-pay | Admitting: Nurse Practitioner

## 2014-05-12 NOTE — Telephone Encounter (Signed)
Received monitor reports from [facility] required physician notification.  Dr. Acie Fredrickson has reviewed and monitor reveals only atrial fibrillation (patient has known hx) and artifact; no further orders

## 2014-05-21 ENCOUNTER — Encounter: Payer: Self-pay | Admitting: Cardiovascular Disease

## 2014-05-21 ENCOUNTER — Ambulatory Visit (INDEPENDENT_AMBULATORY_CARE_PROVIDER_SITE_OTHER): Payer: Medicare Other | Admitting: Cardiovascular Disease

## 2014-05-21 VITALS — BP 130/84 | HR 113 | Ht 64.0 in | Wt 158.8 lb

## 2014-05-21 DIAGNOSIS — R55 Syncope and collapse: Secondary | ICD-10-CM

## 2014-05-21 DIAGNOSIS — I482 Chronic atrial fibrillation, unspecified: Secondary | ICD-10-CM

## 2014-05-21 NOTE — Patient Instructions (Signed)
Your physician recommends that you continue on your current medications as directed. Please refer to the Current Medication list given to you today.  Your physician wants you to follow-up in: 1 year with Dr. Nahser.  You will receive a reminder letter in the mail two months in advance. If you don't receive a letter, please call our office to schedule the follow-up appointment.  

## 2014-05-21 NOTE — Progress Notes (Signed)
Cardiology Office Note   Date:  05/21/2014   ID:  [name], DOB [birth year], MRN [id number]  PCP:  Jerlyn Ly, MD  Cardiologist:   Thayer Headings, MD   Chief Complaint  Patient presents with  . Atrial Fibrillation   1. Small NSTEMI by cardiac enzymes but minimal CAD by cath 2. LVH 3.HTN 4. Hyperlipidemia 5. Hypokalemia 6. Atrial Fibrillation  History of Present Illness:  [name] is doing fairly well. She is walking and exercising some. She notes that her heart rate is still quite variable. She has a hx of  PAF but typically does not notice if her HR is irregular. She continues to have fatigue. We tried cutting her atenolol to 25 mg daily which may have helped.   Dec. 2, 2014:  [name] is doing well. No specific cardiac issues. Occasionally has some dyspnea. She is more fatigued than she would like to be. She has had a sleep study and is being considered for CPAP.  She has had some bleeding in her stool - just occult bleeding. GI work up was negative.  She cannot tell when she is in A-fib or NSR  Dec. 3, 2015: She is seen for follow up of her atrial fib. . Fatigue seems to be better off the atenolol.   May 21, 2014:  [name] is a 80 y.o. female who presents for follow-up of her recent syncopal episode.   The  episode of pre-symcope occurred after she got out of the shower.  She had an episode of syncope that may have been due to a vasovagal reaction. She is 1 monitor the past several weeks she's not had any slow heart rates that would explain the cause of syncope.  She has had RUQ pain.  Seems to be worse after eating lunch today.  Associated with belching.  Exercising regularly.  Feeling well   Past Medical History  Diagnosis Date  . MI, acute, non ST segment elevation Dec.7,2011    cath  . Coronary artery disease     minimal  . Hypertension   . Hyperlipidemia   . Hypokalemia   . Incontinence of urine   . Stromal tumor of the stomach    cancer  . Atrial fibrillation   . Arthritis   . GERD (gastroesophageal reflux disease)   . Hiatal hernia   . Osteopenia   . Bladder polyps   . IBS (irritable bowel syndrome)   . Cervical polyp   . Cataracts, bilateral   . IFG (impaired fasting glucose)   . OSA (obstructive sleep apnea)     hypoxia , not CO 2 retainer,   . OSA on CPAP 06/16/2013     Sleep study 1- 11-15 with an AHI of 61 , now titrated to CPAP 9 cm water with 2 cm EPR.   . Cancer     stomach  . Dysrhythmia      AF  hx     dr Acie Fredrickson  . Chronic kidney disease     urinary incont-    Past Surgical History  Procedure Laterality Date  . Stromal stomach  tumor  03/26/2007    resection  . Ankle fracture surgery  2006    right, plate   . Cardiac catheterization  12/07/20210    Dr.Nahser,minor cad  . Eye surgery      both cataracts  . Colonoscopy    . Upper gi endoscopy    . Knee arthroscopy Right 05/28/2012  Procedure: RIGHT KNEE ARTHROSCOPY PARTIAL MEDIAL AND LATERAL MENISECTOMY WITH CHONDROPLASTY;  Surgeon: Alta Corning, MD;  Location: [facility];  Service: Orthopedics;  Laterality: Right;  . Total knee arthroplasty Right 08/10/2013    DR GRAVES  . Total knee arthroplasty Right 08/10/2013    Procedure: TOTAL KNEE ARTHROPLASTY;  Surgeon: Alta Corning, MD;  Location: [facility];  Service: Orthopedics;  Laterality: Right;     Current Outpatient Prescriptions  Medication Sig Dispense Refill  . alendronate (FOSAMAX) 70 MG tablet Take 70 mg by mouth once a week. Every Wednesdays    . amLODipine (NORVASC) 5 MG tablet TAKE 1 TABLET EVERY DAY 30 tablet 11  . amoxicillin (AMOXIL) 500 MG capsule Take 500 mg by mouth as needed. TAKE 4 CAPSULES 30 MINUTES BEFORE DENTAL PROCEDURES  1  . atorvastatin (LIPITOR) 20 MG tablet Take 20 mg by mouth daily.     [name] CALCIUM-VITAMIN D PO Take 1 tablet by mouth daily.    [name] doxazosin (CARDURA) 4 MG tablet TAKE 1 TABLET BY MOUTH EVERY DAY AT BEDTIME 30 tablet 5  . ezetimibe  (ZETIA) 10 MG tablet Take 5 mg by mouth daily.     . hydrochlorothiazide (MICROZIDE) 12.5 MG capsule TAKE 1 CAPSULE (12.5 MG TOTAL) BY MOUTH 2 (TWO) TIMES DAILY. 180 capsule 3  . KLOR-CON M10 10 MEQ tablet Take 5 mEq by mouth daily.     . metoprolol succinate (TOPROL XL) 25 MG 24 hr tablet Take 1 tablet (25 mg total) by mouth daily. 31 tablet 11  . Multiple Vitamin (MULTIVITAMIN) tablet Take 1 tablet by mouth daily.      . nitroGLYCERIN (NITROSTAT) 0.4 MG SL tablet Place 1 tablet (0.4 mg total) under the tongue every 5 (five) minutes as needed for chest pain. 25 tablet prn  . Nutritional Supplements (CHOICE DM FIBER-BURST PO) Take by mouth daily.     . Omega 3 1200 MG CAPS Take 1 capsule by mouth daily.    [name] oxyCODONE-acetaminophen (PERCOCET/ROXICET) 5-325 MG per tablet Take 1-2 tablets by mouth every 6 (six) hours as needed for severe pain. 50 tablet 0  . rivaroxaban (XARELTO) 20 MG TABS tablet Take 1 tablet (20 mg total) by mouth daily. 30 tablet 6  . valsartan (DIOVAN) 160 MG tablet TAKE 1 TABLET BY MOUTH TWICE A DAY 180 tablet 3   No current facility-administered medications for this visit.    Allergies:   Pradaxa and Tranexamic acid    Social History:  The patient  reports that she has never smoked. She has never used smokeless tobacco. She reports that she drinks about 1.2 oz of alcohol per week. She reports that she does not use illicit drugs.   Family History:  The patient's family history includes Diabetes in her paternal aunt; Heart disease in her paternal grandfather; Pancreatic cancer in her paternal grandmother.    ROS:  Please see the history of present illness.    Review of Systems: Constitutional:  denies fever, chills, diaphoresis, appetite change and fatigue.  HEENT: denies photophobia, eye pain, redness, hearing loss, ear pain, congestion, sore throat, rhinorrhea, sneezing, neck pain, neck stiffness and tinnitus.  Respiratory: denies SOB, DOE, cough, chest tightness,  and wheezing.  Cardiovascular: denies chest pain, palpitations and leg swelling.  Gastrointestinal: denies nausea, vomiting, abdominal pain, diarrhea, constipation, blood in stool.  Genitourinary: denies dysuria, urgency, frequency, hematuria, flank pain and difficulty urinating.  Musculoskeletal: denies  myalgias, back pain, joint swelling, arthralgias and gait problem.  Skin: denies pallor, rash and wound.  Neurological: denies dizziness, seizures, syncope, weakness, light-headedness, numbness and headaches.   Hematological: denies adenopathy, easy bruising, personal or family bleeding history.  Psychiatric/ Behavioral: denies suicidal ideation, mood changes, confusion, nervousness, sleep disturbance and agitation.       All other systems are reviewed and negative.    PHYSICAL EXAM: VS:  BP 130/84 mmHg  Pulse 113  Ht 5\' 4"  (1.626 m)  Wt 158 lb 12.8 oz (72.031 kg)  BMI 27.24 kg/m2  SpO2 96% , BMI Body mass index is 27.24 kg/(m^2). GEN: Well nourished, well developed, in no acute distress HEENT: normal Neck: no JVD, carotid bruits, or masses Cardiac: Irreg. Irreg. ; no murmurs, rubs, or gallops,no edema  Respiratory:  clear to auscultation bilaterally, normal work of breathing GI: soft, nontender, nondistended, + BS MS: no deformity or atrophy Skin: warm and dry, no rash Neuro:  Strength and sensation are intact Psych: normal   EKG:  EKG is not ordered today.    Recent Labs: 08/07/2013: ALT 8 08/11/2013: BUN 14; Creatinine 0.63; Potassium 3.4*; Sodium 135* 08/12/2013: Hemoglobin 8.9*; Platelets 148*    Lipid Panel    Component Value Date/Time   CHOL 123 10/30/2011 0928   TRIG 38.0 10/30/2011 0928   HDL 56.60 10/30/2011 0928   CHOLHDL 2 10/30/2011 0928   VLDL 7.6 10/30/2011 0928   LDLCALC 59 10/30/2011 0928      Wt Readings from Last 3 Encounters:  05/21/14 158 lb 12.8 oz (72.031 kg)  05/05/14 156 lb 3.2 oz (70.852 kg)  02/10/14 156 lb (70.761 kg)       Other studies Reviewed: Additional studies/ records that were reviewed today include: . Review of the above records demonstrates:    ASSESSMENT AND PLAN:  1. Small NSTEMI by cardiac enzymes but minimal CAD by cath 2. LVH 3.HTN 4. Hyperlipidemia 5. Hypokalemia 6. Atrial Fibrillation 7. Patient can be: The patient presents for follow-up of her presyncopal episode. She's had an event monitor on. She has atrial fibrillation but has not had any pauses to explain her episode of presyncope. I suspect that this was due to orthostatic hypertension. The episode occurred right as she was getting up from the chair.  I've reassured her that evening looks okay. I'll see her back in one year for follow-up visit. She'll call sooner if she has any additional problems.  Current medicines are reviewed at length with the patient today.  The patient does not have concerns regarding medicines.  The following changes have been made:  no change  Labs/ tests ordered today include:  No orders of the defined types were placed in this encounter.     Disposition:   FU with me in 1 year     Signed, Nahser, Wonda Cheng, MD  05/21/2014 4:00 PM    [address], [place], [place]  [address] Phone: [phone]; Fax: [phone]

## 2014-06-18 ENCOUNTER — Encounter: Payer: Self-pay | Admitting: Neurology

## 2014-06-21 ENCOUNTER — Telehealth: Payer: Self-pay | Admitting: *Deleted

## 2014-06-21 ENCOUNTER — Telehealth: Payer: Self-pay | Admitting: Internal Medicine

## 2014-06-21 ENCOUNTER — Other Ambulatory Visit (INDEPENDENT_AMBULATORY_CARE_PROVIDER_SITE_OTHER): Payer: Medicare Other

## 2014-06-21 ENCOUNTER — Telehealth: Payer: Self-pay | Admitting: Cardiovascular Disease

## 2014-06-21 ENCOUNTER — Ambulatory Visit (INDEPENDENT_AMBULATORY_CARE_PROVIDER_SITE_OTHER): Payer: Medicare Other | Admitting: Nurse Practitioner

## 2014-06-21 ENCOUNTER — Encounter: Payer: Self-pay | Admitting: Nurse Practitioner

## 2014-06-21 VITALS — BP 118/60 | HR 100 | Ht 63.75 in | Wt 160.1 lb

## 2014-06-21 DIAGNOSIS — I482 Chronic atrial fibrillation, unspecified: Secondary | ICD-10-CM

## 2014-06-21 DIAGNOSIS — K921 Melena: Secondary | ICD-10-CM

## 2014-06-21 LAB — CBC
HEMATOCRIT: 24 % — AB (ref 36.0–46.0)
Hemoglobin: 8 g/dL — CL (ref 12.0–15.0)
MCHC: 33.2 g/dL (ref 30.0–36.0)
MCV: 84.7 fl (ref 78.0–100.0)
Platelets: 211 10*3/uL (ref 150.0–400.0)
RBC: 2.84 Mil/uL — AB (ref 3.87–5.11)
RDW: 15 % (ref 11.5–15.5)
WBC: 4.4 10*3/uL (ref 4.0–10.5)

## 2014-06-21 LAB — BASIC METABOLIC PANEL
BUN: 23 mg/dL (ref 6–23)
CALCIUM: 9.2 mg/dL (ref 8.4–10.5)
CHLORIDE: 101 meq/L (ref 96–112)
CO2: 30 mEq/L (ref 19–32)
Creatinine, Ser: 0.8 mg/dL (ref 0.40–1.20)
GFR: 73.78 mL/min (ref >60.00)
GLUCOSE: 92 mg/dL (ref 70–99)
Potassium: 3.7 mEq/L (ref 3.5–5.1)
Sodium: 134 mEq/L — ABNORMAL LOW (ref 135–145)

## 2014-06-21 MED ORDER — NA SULFATE-K SULFATE-MG SULF 17.5-3.13-1.6 GM/177ML PO SOLN: ORAL | Status: DC

## 2014-06-21 NOTE — Telephone Encounter (Signed)
Lm that Dr. Acie Fredrickson will be in office on Tuesday and will address question of stopping Xarelto.

## 2014-06-21 NOTE — Telephone Encounter (Signed)
New message    Request for surgical clearance:  1. What type of surgery is being performed? colonoscopy   2. When is this surgery scheduled? 4.29.2016 @ 11 am   3. Are there any medications that need to be held prior to surgery and how long? xarelto - due to having active bleeding now   4. Name of physician performing surgery? Dr. Henrene Pastor   5. What is your office phone and fax number? [phone]

## 2014-06-21 NOTE — Telephone Encounter (Signed)
Pt scheduled to see Paula Guenther NP today at 2:30pm. Pt aware of appt. 

## 2014-06-21 NOTE — Patient Instructions (Addendum)
Your physician has requested that you go to the basement for the following lab work before leaving today: CBC, BMP  You have been scheduled for a colonoscopy. Please follow written instructions given to you at your visit today.  Please pick up your prep supplies at the pharmacy within the next 1-3 days. If you use inhalers (even only as needed), please bring them with you on the day of your procedure.

## 2014-06-21 NOTE — Telephone Encounter (Signed)
May hold Xarelto the night before colonoscopy

## 2014-06-21 NOTE — Progress Notes (Signed)
     History of Present Illness:   Patient is a 80 year old female with multiple significant medical problems. She is known to Dr. Henrene Pastor. Patient has a remote history of a GI stromal tumor status post resection. She had a normal complete colonoscopy July 2013. Upper endoscopy was normal except for postoperative changes. These studies were done for workup of Hemoccult-positive stools. Patient was reevaluated by Dr. Henrene Pastor in July 2014, again for Hemoccult-positive stool on Xarelto. Capsule endoscopy was discussed but the patient and Dr. Henrene Pastor. agreed that monitoring with yearly hemoccults / labs was reasonable. Patient never had any overt GI bleeding.  Patient was recently seen by her PCP. His note mentions some recent loose stool with possible blood.  Patient has continued to have stools over the last several days. Stools are mixed with blood.  She has been having some intermittent lower abdominal cramping as well.    Current Medications, Allergies, Past Medical History, Past Surgical History, Family History and Social History were reviewed in [employer] record. Physical Exam: General: Pleasant, well developed , white female in no acute distress Head: Normocephalic and atraumatic Eyes:  sclerae anicteric, conjunctiva pink  Ears: Normal auditory acuity Lungs: Clear throughout to auscultation Heart: irregular rhythm, slightly tachy Abdomen: Soft, non distended, non-tender. No masses, no hepatomegaly. Normal bowel sounds Rectal: No fissures of inflamed external hemorrhoids. On anoscopy there was a mildly inflamed hemorrhoid just inside the anal canal. Proximal to that there was some formed stool which was mixed with blood. Stool actually red itself. Musculoskeletal: Symmetrical with no gross deformities  Extremities: No edema  Neurological: Alert oriented x 4, grossly nonfocal Psychological:  Alert and cooperative. Normal mood and affect  Assessment and  Recommendations:  1.  Pleasant 80 year old female with multiple, significant medical problems as listed above and now referred by PCP for overt GI bleeding on Xarelto. She was evaluated in 2013 for heme positive stools without anemia. EGD and colonoscopy negative. Reevaluated for the same in 2014. Repeat endoscopy not done. Small bowel video capsule study discussed but not pursued. Patient was monitored with labs and yearly Hemoccults. Now with several-day history of mild lower abdominal cramping and blood mixed with stool. She is  hemodynamically stable, feels okay.  This could be ischemic. Diverticular seems unlikely with a cramping but still possible  We will check a CBC.   Contact PCP as Xarelto needs to be held.  Patient needs repeat colonoscopy which can be done on Friday. If negative, consider small bowel capsule study. The risks, benefits, and alternatives to colonoscopy with possible biopsy and possible polypectomy were discussed with the patient and she consents to proceed.   Patient may need blood thinning on hemoglobin. We will call her with results  2. Atrial fibrillation, on Xarelto. Heart rate 102, irregular. Will contact PCP for holding the Xarelto. Patient realizes this does increase her risk of stroke but her gastric intestinal bleeding is an immediate risk    Addendum 06/21/14  5:40pm: Spoke with Dr. Virgina Jock at [facility] (covering for Dr. Joylene Draft). We agreed Xarelto must be held. Their office will check patient's CBC Wed. She may require blood transfusion prior to colonoscopy. Depending on clinical course she may end up in hospital with colonoscopy being done inpatient.

## 2014-06-21 NOTE — Telephone Encounter (Signed)
06/21/2014   RE: [name] DOB: 07/12/1934 MRN: [id number]   Dear Dr. Acie Fredrickson,    We have scheduled the above patient for an endoscopic procedure. Our records show that she is on anticoagulation therapy.   Please advise as to how long the patient may come off her therapy of Xarelto prior to the procedure, which is scheduled for 06-25-14.  Please fax back/ or route the completed form to [facility] at [phone].   Sincerely,    Gerlean Ren

## 2014-06-22 ENCOUNTER — Encounter: Payer: Self-pay | Admitting: Nurse Practitioner

## 2014-06-22 NOTE — Progress Notes (Signed)
Reviewed. Case discussed with the nurse practitioner. The patient will see Dr. Virgina Jock tomorrow for clinical evaluation. Tentative plans for colonoscopy later this week off anticoagulation.

## 2014-06-22 NOTE — Telephone Encounter (Signed)
I called [facility] to get fax number and ATTN: Note of Xarelto clearance faxed to [phone], ATTN:  Magda Paganini Confirmation received

## 2014-06-23 ENCOUNTER — Telehealth: Payer: Self-pay

## 2014-06-23 NOTE — Telephone Encounter (Signed)
Spoke with patient and let her know that, per Dr. Acie Fredrickson, she should hold her Xarelto the day before her procedure.  Patient acknowledged and understood.

## 2014-06-23 NOTE — Telephone Encounter (Signed)
[name] may hold Xarelto for 2 days prior to endoscopy procedure

## 2014-06-24 ENCOUNTER — Observation Stay (HOSPITAL_COMMUNITY)
Admission: EM | Admit: 2014-06-24 | Discharge: 2014-06-26 | Disposition: A | Discharge status: Home / Self Care | Payer: Medicare Other | Attending: Internal Medicine | Admitting: Internal Medicine

## 2014-06-24 ENCOUNTER — Encounter (HOSPITAL_COMMUNITY): Payer: Self-pay | Admitting: Emergency Medicine

## 2014-06-24 DIAGNOSIS — G4733 Obstructive sleep apnea (adult) (pediatric): Secondary | ICD-10-CM | POA: Insufficient documentation

## 2014-06-24 DIAGNOSIS — I4891 Unspecified atrial fibrillation: Secondary | ICD-10-CM | POA: Insufficient documentation

## 2014-06-24 DIAGNOSIS — K644 Residual hemorrhoidal skin tags: Secondary | ICD-10-CM | POA: Diagnosis not present

## 2014-06-24 DIAGNOSIS — I2510 Atherosclerotic heart disease of native coronary artery without angina pectoris: Secondary | ICD-10-CM | POA: Insufficient documentation

## 2014-06-24 DIAGNOSIS — I129 Hypertensive chronic kidney disease with stage 1 through stage 4 chronic kidney disease, or unspecified chronic kidney disease: Secondary | ICD-10-CM | POA: Diagnosis not present

## 2014-06-24 DIAGNOSIS — I482 Chronic atrial fibrillation, unspecified: Secondary | ICD-10-CM | POA: Diagnosis present

## 2014-06-24 DIAGNOSIS — I10 Essential (primary) hypertension: Secondary | ICD-10-CM | POA: Insufficient documentation

## 2014-06-24 DIAGNOSIS — D62 Acute posthemorrhagic anemia: Secondary | ICD-10-CM | POA: Diagnosis not present

## 2014-06-24 DIAGNOSIS — K648 Other hemorrhoids: Secondary | ICD-10-CM | POA: Insufficient documentation

## 2014-06-24 DIAGNOSIS — E876 Hypokalemia: Secondary | ICD-10-CM | POA: Insufficient documentation

## 2014-06-24 DIAGNOSIS — E871 Hypo-osmolality and hyponatremia: Secondary | ICD-10-CM | POA: Insufficient documentation

## 2014-06-24 DIAGNOSIS — K922 Gastrointestinal hemorrhage, unspecified: Secondary | ICD-10-CM

## 2014-06-24 DIAGNOSIS — E785 Hyperlipidemia, unspecified: Secondary | ICD-10-CM | POA: Insufficient documentation

## 2014-06-24 DIAGNOSIS — K921 Melena: Secondary | ICD-10-CM | POA: Diagnosis not present

## 2014-06-24 DIAGNOSIS — I252 Old myocardial infarction: Secondary | ICD-10-CM | POA: Insufficient documentation

## 2014-06-24 DIAGNOSIS — K219 Gastro-esophageal reflux disease without esophagitis: Secondary | ICD-10-CM | POA: Insufficient documentation

## 2014-06-24 DIAGNOSIS — Z8719 Personal history of other diseases of the digestive system: Secondary | ICD-10-CM | POA: Diagnosis present

## 2014-06-24 DIAGNOSIS — Z7901 Long term (current) use of anticoagulants: Secondary | ICD-10-CM | POA: Diagnosis not present

## 2014-06-24 DIAGNOSIS — D500 Iron deficiency anemia secondary to blood loss (chronic): Secondary | ICD-10-CM

## 2014-06-24 DIAGNOSIS — I5032 Chronic diastolic (congestive) heart failure: Secondary | ICD-10-CM | POA: Diagnosis present

## 2014-06-24 DIAGNOSIS — N189 Chronic kidney disease, unspecified: Secondary | ICD-10-CM | POA: Diagnosis not present

## 2014-06-24 DIAGNOSIS — G4730 Sleep apnea, unspecified: Secondary | ICD-10-CM

## 2014-06-24 LAB — COMPREHENSIVE METABOLIC PANEL
ALBUMIN: 3.5 g/dL (ref 3.5–5.2)
ALT: 10 U/L (ref 0–35)
ANION GAP: 7 (ref 5–15)
AST: 17 U/L (ref 0–37)
Alkaline Phosphatase: 40 U/L (ref 39–117)
BUN: 17 mg/dL (ref 6–23)
CALCIUM: 9.2 mg/dL (ref 8.4–10.5)
CO2: 24 mmol/L (ref 19–32)
CREATININE: 0.76 mg/dL (ref 0.50–1.10)
Chloride: 104 mmol/L (ref 96–112)
GFR calc Af Amer: >90 mL/min (ref >90)
GFR calc non Af Amer: 79 mL/min — ABNORMAL LOW (ref >90)
GLUCOSE: 98 mg/dL (ref 70–99)
Potassium: 3.4 mmol/L — ABNORMAL LOW (ref 3.5–5.1)
Sodium: 135 mmol/L (ref 135–145)
Total Bilirubin: 0.9 mg/dL (ref 0.3–1.2)
Total Protein: 5.9 g/dL — ABNORMAL LOW (ref 6.0–8.3)

## 2014-06-24 LAB — PREPARE RBC (CROSSMATCH)

## 2014-06-24 LAB — PROTIME-INR
INR: 2.1 — ABNORMAL HIGH (ref 0.00–1.49)
Prothrombin Time: 23.8 seconds — ABNORMAL HIGH (ref 11.6–15.2)

## 2014-06-24 LAB — CBC
HEMATOCRIT: 25.1 % — AB (ref 36.0–46.0)
HEMOGLOBIN: 8.3 g/dL — AB (ref 12.0–15.0)
MCH: 27.7 pg (ref 26.0–34.0)
MCHC: 33.1 g/dL (ref 30.0–36.0)
MCV: 83.7 fL (ref 78.0–100.0)
Platelets: 206 10*3/uL (ref 150–400)
RBC: 3 MIL/uL — ABNORMAL LOW (ref 3.87–5.11)
RDW: 14.4 % (ref 11.5–15.5)
WBC: 4.3 10*3/uL (ref 4.0–10.5)

## 2014-06-24 LAB — CBC WITH DIFFERENTIAL/PLATELET
BASOS ABS: 0 10*3/uL (ref 0.0–0.1)
BASOS PCT: 1 % (ref 0–1)
Eosinophils Absolute: 0.1 10*3/uL (ref 0.0–0.7)
Eosinophils Relative: 5 % (ref 0–5)
HEMATOCRIT: 21.8 % — AB (ref 36.0–46.0)
Hemoglobin: 7.1 g/dL — ABNORMAL LOW (ref 12.0–15.0)
Lymphocytes Relative: 32 % (ref 12–46)
Lymphs Abs: 1 10*3/uL (ref 0.7–4.0)
MCH: 27.8 pg (ref 26.0–34.0)
MCHC: 32.6 g/dL (ref 30.0–36.0)
MCV: 85.5 fL (ref 78.0–100.0)
Monocytes Absolute: 0.4 10*3/uL (ref 0.1–1.0)
Monocytes Relative: 12 % (ref 3–12)
Neutro Abs: 1.6 10*3/uL — ABNORMAL LOW (ref 1.7–7.7)
Neutrophils Relative %: 50 % (ref 43–77)
Platelets: 187 10*3/uL (ref 150–400)
RBC: 2.55 MIL/uL — ABNORMAL LOW (ref 3.87–5.11)
RDW: 14.3 % (ref 11.5–15.5)
WBC: 3.1 10*3/uL — ABNORMAL LOW (ref 4.0–10.5)

## 2014-06-24 LAB — BRAIN NATRIURETIC PEPTIDE: B Natriuretic Peptide: 401.1 pg/mL — ABNORMAL HIGH (ref 0.0–100.0)

## 2014-06-24 MED ORDER — SODIUM CHLORIDE 0.9 % IV SOLN
Freq: Once | INTRAVENOUS | Status: AC
  Administered 2014-06-24: 16:00:00 via INTRAVENOUS

## 2014-06-24 MED ORDER — ONDANSETRON HCL 4 MG PO TABS: 4.0000 mg | ORAL_TABLET | Freq: Four times a day (QID) | ORAL | Status: DC | PRN

## 2014-06-24 MED ORDER — ONDANSETRON HCL 4 MG/2ML IJ SOLN: 4.0000 mg | Freq: Four times a day (QID) | INTRAMUSCULAR | Status: DC | PRN

## 2014-06-24 MED ORDER — PANTOPRAZOLE SODIUM 40 MG IV SOLR
40.0000 mg | INTRAVENOUS | Status: DC
  Administered 2014-06-24: 40 mg via INTRAVENOUS
  Filled 2014-06-24 (×2): qty 40

## 2014-06-24 MED ORDER — SODIUM CHLORIDE 0.9 % IV SOLN
INTRAVENOUS | Status: DC
  Administered 2014-06-24: 20:00:00 via INTRAVENOUS
  Administered 2014-06-25: 1000 mL via INTRAVENOUS
  Administered 2014-06-26: 12:00:00 via INTRAVENOUS

## 2014-06-24 MED ORDER — SODIUM CHLORIDE 0.9 % IJ SOLN
3.0000 mL | Freq: Two times a day (BID) | INTRAMUSCULAR | Status: DC
  Administered 2014-06-24 – 2014-06-26 (×2): 3 mL via INTRAVENOUS

## 2014-06-24 NOTE — Progress Notes (Signed)
Pt. Refused cpap. Pt. States she doesn't want to wear cpap tonight and that if she stays another night, she will get her own from home. RT informed pt. To notify if she changes her mind.

## 2014-06-24 NOTE — H&P (Signed)
History and Physical  [name]:[id number] DOB: 01/07/1935 DOA: 06/24/2014  Referring physician: Tori Milks, ER physician PCP: Jerlyn Ly, MD   Chief Complaint: Low blood  HPI: [name] is a 80 y.o. female  With past medical history of atrial fibrillation and chronic diastolic heart failure on xarelto whose hemoglobin has normally been around 12-13, but in following her hemoglobin, had a 7 drop into the 9-10 range. In June. Since that time, she has had a slow downward trend which has been followed as an outpatient and patient was actually scheduled to have colonoscopy done tomorrow, Friday, 4/29 as an outpatient by [facility].  Patient had a hemoglobin done 3 days ago which is noted to be 8.0. When she went to go see the [place] doctors today follow-up lab work, hemoglobin was noted to be down to 7.5 and then she was sent over to emergency room noted even lower at 7.1. Patient initially was set up for transfusion but when she was noted to be borderline tachycardic, it was felt best that she come in for further evaluation and get her colonoscopy done as an inpatient. Patient's labs are otherwise normal. Hospitalists were called for further evaluation and admission.   Review of Systems:  Patient seen in the emergency room. Pt complains of mild change in her bowel habits. While she does not know diarrhea, she says her stools have been somewhat looser in the past few weeks. They still occasionally pink tinged  Pt denies any headaches, vision changes, dysphagia, chest pain, palpitations, shortness of breath, wheeze, cough, abdominal pain, hematuria, dysuria, constipation or diarrhea, focal extremity numbness weakness or pain.  Review of systems are otherwise negative  Past Medical History  Diagnosis Date  . MI, acute, non ST segment elevation Dec.7,2011    cath  . Coronary artery disease     minimal  . Hypertension   . Hyperlipidemia   . Hypokalemia   . Incontinence of  urine   . Stromal tumor of the stomach     cancer  . Atrial fibrillation   . Arthritis   . GERD (gastroesophageal reflux disease)   . Hiatal hernia   . Osteopenia   . Bladder polyps   . IBS (irritable bowel syndrome)   . Cervical polyp   . Cataracts, bilateral   . IFG (impaired fasting glucose)   . OSA (obstructive sleep apnea)     hypoxia , not CO 2 retainer,   . OSA on CPAP 06/16/2013     Sleep study 1- 11-15 with an AHI of 61 , now titrated to CPAP 9 cm water with 2 cm EPR.   . Cancer     stomach  . Dysrhythmia      AF  hx     dr Acie Fredrickson  . Chronic kidney disease     urinary incont-   Past Surgical History  Procedure Laterality Date  . Stromal stomach  tumor  03/26/2007    resection  . Ankle fracture surgery  2006    right, plate   . Cardiac catheterization  12/07/20210    Dr.Nahser,minor cad  . Eye surgery      both cataracts  . Colonoscopy    . Upper gi endoscopy    . Knee arthroscopy Right 05/28/2012    Procedure: RIGHT KNEE ARTHROSCOPY PARTIAL MEDIAL AND LATERAL MENISECTOMY WITH CHONDROPLASTY;  Surgeon: Alta Corning, MD;  Location: [facility];  Service: Orthopedics;  Laterality: Right;  . Total  knee arthroplasty Right 08/10/2013    Procedure: TOTAL KNEE ARTHROPLASTY;  Surgeon: Alta Corning, MD;  Location: [facility];  Service: Orthopedics;  Laterality: Right;   Social History:  reports that she has never smoked. She has never used smokeless tobacco. She reports that she drinks about 1.2 oz of alcohol per week. She reports that she does not use illicit drugs. Patient lives at home with her husband & is able to participate in activities of daily living with out assistance  Allergies  Allergen Reactions  . Pradaxa [Dabigatran Etexilate Mesylate] Nausea Only  . Tranexamic Acid     Patient ineligible for Tranexamic acid due to hx of NSTEMI and CAD.    Family History  Problem Relation Age of Onset  . Pancreatic cancer Paternal Grandmother   . Diabetes  Paternal Aunt   . Heart disease Paternal Grandfather     both sides of the family      Prior to Admission medications   Medication Sig Start Date End Date Taking? Authorizing Provider  alendronate (FOSAMAX) 70 MG tablet Take 70 mg by mouth once a week. Take with a full glass of water on an empty stomach.* every wednesday   Yes Historical Provider, MD  amLODipine (NORVASC) 5 MG tablet TAKE 1 TABLET EVERY DAY 01/25/14  Yes Thayer Headings, MD  atorvastatin (LIPITOR) 20 MG tablet Take 20 mg by mouth daily.    Yes Historical Provider, MD  CALCIUM-VITAMIN D PO Take 1 tablet by mouth daily.   Yes Historical Provider, MD  Chlorphen-Pseudoephed-APAP (CORICIDIN D PO) Take 1 capsule by mouth daily as needed (sinua).   Yes Historical Provider, MD  Cholecalciferol 10000 UNITS CAPS Take 1,000 capsules by mouth daily.   Yes Historical Provider, MD  doxazosin (CARDURA) 4 MG tablet TAKE 1 TABLET BY MOUTH EVERY DAY AT BEDTIME 12/21/13  Yes Deboraha Sprang, MD  esomeprazole (NEXIUM) 40 MG capsule Take 40 mg by mouth 4 (four) times a week.   Yes Historical Provider, MD  ezetimibe (ZETIA) 10 MG tablet Take 5 mg by mouth daily.    Yes Historical Provider, MD  hydrochlorothiazide (MICROZIDE) 12.5 MG capsule TAKE 1 CAPSULE (12.5 MG TOTAL) BY MOUTH 2 (TWO) TIMES DAILY. 04/27/14  Yes Thayer Headings, MD  KLOR-CON M10 10 MEQ tablet Take 5 mEq by mouth daily.  08/16/11  Yes Historical Provider, MD  methocarbamol (ROBAXIN) 750 MG tablet Take 750 mg by mouth every 8 (eight) hours as needed for muscle spasms.   Yes Historical Provider, MD  metoprolol succinate (TOPROL XL) 25 MG 24 hr tablet Take 1 tablet (25 mg total) by mouth daily. 01/28/14  Yes Thayer Headings, MD  Multiple Vitamin (MULTIVITAMIN) tablet Take 1 tablet by mouth daily.     Yes Historical Provider, MD  Multiple Vitamins-Minerals (ICAPS AREDS FORMULA PO) Take 1 capsule by mouth daily.   Yes Historical Provider, MD  nitroGLYCERIN (NITROSTAT) 0.4 MG SL tablet Place  1 tablet (0.4 mg total) under the tongue every 5 (five) minutes as needed for chest pain. 10/05/13  Yes Thayer Headings, MD  Nutritional Supplements (CHOICE DM FIBER-BURST PO) Take by mouth daily.    Yes Historical Provider, MD  OMEGA-3 FATTY ACIDS PO Take 1 capsule by mouth daily.   Yes Historical Provider, MD  oxyCODONE-acetaminophen (PERCOCET/ROXICET) 5-325 MG per tablet Take 1-2 tablets by mouth every 4 (four) hours as needed for moderate pain or severe pain.   Yes Historical Provider, MD  rivaroxaban (XARELTO) 20  MG TABS tablet Take 1 tablet (20 mg total) by mouth daily. 12/31/13  Yes Thayer Headings, MD  valsartan (DIOVAN) 160 MG tablet TAKE 1 TABLET BY MOUTH TWICE A DAY 04/27/14  Yes Thayer Headings, MD  zolpidem (AMBIEN CR) 6.25 MG CR tablet Take 6.25 mg by mouth at bedtime as needed for sleep.   Yes Historical Provider, MD    Physical Exam: BP 140/75 mmHg  Pulse 92  Temp(Src) 98.6 F (37 C) (Oral)  Resp 19  SpO2 98%  General:  Alert and oriented 3, no acute distress Eyes: Sclera nonicteric, extraocular movements are intact ENT: Normocephalic/atraumatic, mucous murmurs are slightly dry Neck: No JVD Cardiovascular: Irregular rhythm, rate controlled Respiratory: Clear to auscultation bilaterally Abdomen: Soft, obese, nontender, positive bowel sounds Skin: No skin breaks, tears or lesions Musculoskeletal: No clubbing or cyanosis or edema Psychiatric: Patient is appropriate, no evidence of psychoses Neurologic: No focal deficits           Labs on Admission:  Basic Metabolic Panel:  Recent Labs Lab 06/21/14 1614 06/24/14 1318  NA 134* 135  K 3.7 3.4*  CL 101 104  CO2 30 24  GLUCOSE 92 98  BUN 23 17  CREATININE 0.80 0.76  CALCIUM 9.2 9.2   Liver Function Tests:  Recent Labs Lab 06/24/14 1318  AST 17  ALT 10  ALKPHOS 40  BILITOT 0.9  PROT 5.9*  ALBUMIN 3.5   No results for input(s): LIPASE, AMYLASE in the last 168 hours. No results for input(s): AMMONIA in  the last 168 hours. CBC:  Recent Labs Lab 06/21/14 1614 06/24/14 1318  WBC 4.4 3.1*  NEUTROABS  --  1.6*  HGB 8.0 Repeated and verified X2.* 7.1*  HCT 24.0* 21.8*  MCV 84.7 85.5  PLT 211.0 187   Cardiac Enzymes: No results for input(s): CKTOTAL, CKMB, CKMBINDEX, TROPONINI in the last 168 hours.  BNP (last 3 results) No results for input(s): BNP in the last 8760 hours.  ProBNP (last 3 results) No results for input(s): PROBNP in the last 8760 hours.  CBG: No results for input(s): GLUCAP in the last 168 hours.  Radiological Exams on Admission: No results found.  EKG:  not done  Assessment/Plan Present on Admission:  . GI bleed causing Acute blood loss anemia: Unclear etiology. Holding Xarelto. GI to see. IV PPI. Status post 2 units packed red blood cells.  . Hypertension: Holding antihypertensives given borderline hypotension  . Chronic diastolic heart failure: Gently hydrating. Watching daily weights.  . Chronic atrial fibrillation: Resume anticoagulation as soon as cleared by cardiology. In the meantime, keep heart rate controlled.  . Sleep apnea: Nightly C Pap  Consultants: [facility], spoke to Dr. Carlean Purl  Code Status: Full code as confirmed by patient  Family Communication: Husband had just left the room  Disposition Plan: Depending on colonoscopy results, discharge within the next 24-48 hours  Time spent: 71 minutes  [facility] Pager [phone]

## 2014-06-24 NOTE — Progress Notes (Signed)
[name] [id number] Admission Data: 06/24/2014 7:06 PM Attending Provider: Annita Brod, MD  VOH:[id number],GGYI A, MD Consults/ Treatment Team: Treatment Team:  Sabrina Castle, MD  [name] is a 80 y.o. female patient admitted from ED awake, alert  & orientated  X 3,  Full Code, VSS - Blood pressure 157/70, pulse 98, temperature 97.8 F (36.6 C), temperature source Oral, resp. rate 16, SpO2 97 %., no c/o shortness of breath, no c/o chest pain, no distress noted. Tele # 24 placed and pt is currently running:atrial fibrillation, with ventricular rate of 108.   IV site WDL:  antecubital left, condition patent and no redness with a transparent dsg that's clean dry and intact.  Allergies:   Allergies  Allergen Reactions  . Pradaxa [Dabigatran Etexilate Mesylate] Nausea Only  . Tranexamic Acid     Patient ineligible for Tranexamic acid due to hx of NSTEMI and CAD.     Past Medical History  Diagnosis Date  . MI, acute, non ST segment elevation Dec.7,2011    cath  . Coronary artery disease     minimal  . Hypertension   . Hyperlipidemia   . Hypokalemia   . Incontinence of urine   . Stromal tumor of the stomach     cancer  . Atrial fibrillation   . Arthritis   . GERD (gastroesophageal reflux disease)   . Hiatal hernia   . Osteopenia   . Bladder polyps   . IBS (irritable bowel syndrome)   . Cervical polyp   . Cataracts, bilateral   . IFG (impaired fasting glucose)   . OSA (obstructive sleep apnea)     hypoxia , not CO 2 retainer,   . OSA on CPAP 06/16/2013     Sleep study 1- 11-15 with an AHI of 61 , now titrated to CPAP 9 cm water with 2 cm EPR.   . Cancer     stomach  . Dysrhythmia      AF  hx     dr [name]  . Chronic kidney disease     urinary incont-    History:  obtained from the patient. Tobacco/alcohol: denied social drinker  Pt orientation to unit, room and routine. Information packet given to patient/family and safety video watched.  Admission INP  armband ID verified with patient/family, and in place. SR up x 2, fall risk assessment complete with Patient and family verbalizing understanding of risks associated with falls. Pt verbalizes an understanding of how to use the call bell and to call for help before getting out of bed.  Skin, clean-dry- intact without evidence of bruising, or skin tears.   No evidence of skin break down noted on exam. no rashes, no ecchymoses    Will cont to monitor and assist as needed.  [name] Sabrina Sheffield, RN 06/24/2014 7:06 PM

## 2014-06-24 NOTE — ED Notes (Signed)
Attempted report 

## 2014-06-24 NOTE — ED Notes (Signed)
Spoke to [place] from blood bank to confirm it is okay to transfuse A-negative blood with A-positive recipient.

## 2014-06-24 NOTE — ED Notes (Signed)
Pt here with anemia and lower GI bleed x several weeks with dark and red stool; pt sts sent here for blood transfusion so could have colonoscopy tomorrow

## 2014-06-24 NOTE — ED Provider Notes (Signed)
80 year old female, anticoagulated on Xarelto for atrial fibrillation presents with  Anemia and Hemoccult positive stools, over the last several days has had increased lightheadedness and dizziness with standing, no shortness of breath, no peripheral edema, stools have been particularly dark recently. Hemoglobin close to 7 today, this is acutely lower than earlier this week, scheduled for outpatient colonoscopy tomorrow, this will need to be done inpatient, blood transfusion, admit to hospitalist service. The patient is in agreement with this plan. On my exam she has a soft nontender abdomen, atrial fibrillation with a pulse of approximately 90-110. Normal blood pressure  Filed Vitals:   06/24/14 1436 06/24/14 1545 06/24/14 1558 06/24/14 1617  BP: 123/93 122/78 128/92 133/67  Pulse: 98 95 98 101  Temp:   98 F (36.7 C) 98.2 F (36.8 C)  TempSrc:   Oral Oral  Resp: 16 24 22 23   SpO2: 99% 99% 100% 99%   I saw and evaluated the patient, reviewed the resident's note and I agree with the findings and plan.    Final diagnoses:  Gastrointestinal hemorrhage, unspecified gastritis, unspecified gastrointestinal hemorrhage type  Anemia due to blood loss       Noemi Chapel, MD 06/26/14 1042

## 2014-06-24 NOTE — Progress Notes (Signed)
Called for report. 

## 2014-06-24 NOTE — ED Provider Notes (Signed)
CSN: [id number]     Arrival date & time 06/24/14  1243 History   First MD Initiated Contact with Patient 06/24/14 1447     Chief Complaint  Patient presents with  . GI Bleeding  . Anemia   (Consider location/radiation/quality/duration/timing/severity/associated sxs/prior Treatment)  Patient is a 80 y.o. female presenting with hematochezia. The history is provided by the patient and medical records. No language interpreter was used.  Rectal Bleeding Quality:  Maroon and bright red Amount:  Moderate Timing:  Intermittent Progression:  Waxing and waning Chronicity:  New Context: defecation and spontaneously   Context: not rectal injury and not rectal pain   Relieved by:  Nothing Worsened by:  Defecation Ineffective treatments:  None tried Associated symptoms: light-headedness   Associated symptoms: no abdominal pain, no epistaxis, no fever, no loss of consciousness, no recent illness and no vomiting   Risk factors: anticoagulant use   Risk factors: no NSAID use     Past Medical History  Diagnosis Date  . MI, acute, non ST segment elevation Dec.7,2011    cath  . Coronary artery disease     minimal  . Hypertension   . Hyperlipidemia   . Hypokalemia   . Incontinence of urine   . Stromal tumor of the stomach     cancer  . Atrial fibrillation   . Arthritis   . GERD (gastroesophageal reflux disease)   . Hiatal hernia   . Osteopenia   . Bladder polyps   . IBS (irritable bowel syndrome)   . Cervical polyp   . Cataracts, bilateral   . IFG (impaired fasting glucose)   . OSA (obstructive sleep apnea)     hypoxia , not CO 2 retainer,   . OSA on CPAP 06/16/2013     Sleep study 1- 11-15 with an AHI of 61 , now titrated to CPAP 9 cm water with 2 cm EPR.   . Cancer     stomach  . Dysrhythmia      AF  hx     dr Acie Fredrickson  . Chronic kidney disease     urinary incont-   Past Surgical History  Procedure Laterality Date  . Stromal stomach  tumor  03/26/2007    resection  . Ankle  fracture surgery  2006    right, plate   . Cardiac catheterization  12/07/20210    Dr.Nahser,minor cad  . Eye surgery      both cataracts  . Colonoscopy    . Upper gi endoscopy    . Knee arthroscopy Right 05/28/2012    Procedure: RIGHT KNEE ARTHROSCOPY PARTIAL MEDIAL AND LATERAL MENISECTOMY WITH CHONDROPLASTY;  Surgeon: Alta Corning, MD;  Location: [facility];  Service: Orthopedics;  Laterality: Right;  . Total knee arthroplasty Right 08/10/2013    Procedure: TOTAL KNEE ARTHROPLASTY;  Surgeon: Alta Corning, MD;  Location: [facility];  Service: Orthopedics;  Laterality: Right;   Family History  Problem Relation Age of Onset  . Pancreatic cancer Paternal Grandmother   . Diabetes Paternal Aunt   . Heart disease Paternal Grandfather     both sides of the family   History  Substance Use Topics  . Smoking status: Never Smoker   . Smokeless tobacco: Never Used  . Alcohol Use: 1.2 oz/week    2 Glasses of wine per week     Comment: one per week   OB History    No data available     Review of Systems  Constitutional:  Negative for fever and fatigue.  HENT: Negative for nosebleeds.   Respiratory: Positive for shortness of breath. Negative for cough and chest tightness.   Cardiovascular: Negative for chest pain.  Gastrointestinal: Positive for blood in stool, hematochezia and anal bleeding. Negative for nausea, vomiting, abdominal pain, diarrhea and constipation.  Neurological: Positive for light-headedness. Negative for loss of consciousness, syncope, weakness and headaches.  Psychiatric/Behavioral: Negative for confusion.  All other systems reviewed and are negative.     Allergies  Pradaxa and Tranexamic acid  Home Medications   Prior to Admission medications   Medication Sig Start Date End Date Taking? Authorizing Provider  amLODipine (NORVASC) 5 MG tablet TAKE 1 TABLET EVERY DAY 01/25/14   Thayer Headings, MD  amoxicillin (AMOXIL) 500 MG capsule Take 500 mg by  mouth as needed. TAKE 4 CAPSULES 30 [place] PROCEDURES 04/28/14   Historical Provider, MD  atorvastatin (LIPITOR) 20 MG tablet Take 20 mg by mouth daily.     Historical Provider, MD  CALCIUM-VITAMIN D PO Take 1 tablet by mouth daily.    Historical Provider, MD  doxazosin (CARDURA) 4 MG tablet TAKE 1 TABLET BY MOUTH EVERY DAY AT BEDTIME 12/21/13   Deboraha Sprang, MD  ezetimibe (ZETIA) 10 MG tablet Take 5 mg by mouth daily.     Historical Provider, MD  hydrochlorothiazide (MICROZIDE) 12.5 MG capsule TAKE 1 CAPSULE (12.5 MG TOTAL) BY MOUTH 2 (TWO) TIMES DAILY. 04/27/14   Thayer Headings, MD  KLOR-CON M10 10 MEQ tablet Take 5 mEq by mouth daily.  08/16/11   Historical Provider, MD  metoprolol succinate (TOPROL XL) 25 MG 24 hr tablet Take 1 tablet (25 mg total) by mouth daily. 01/28/14   Thayer Headings, MD  Multiple Vitamin (MULTIVITAMIN) tablet Take 1 tablet by mouth daily.      Historical Provider, MD  Na Sulfate-K Sulfate-Mg Sulf SOLN Take 1 kit as directed 06/21/14   Willia Craze, NP  nitroGLYCERIN (NITROSTAT) 0.4 MG SL tablet Place 1 tablet (0.4 mg total) under the tongue every 5 (five) minutes as needed for chest pain. 10/05/13   Thayer Headings, MD  Nutritional Supplements (CHOICE DM FIBER-BURST PO) Take by mouth daily.     Historical Provider, MD  rivaroxaban (XARELTO) 20 MG TABS tablet Take 1 tablet (20 mg total) by mouth daily. 12/31/13   Thayer Headings, MD  valsartan (DIOVAN) 160 MG tablet TAKE 1 TABLET BY MOUTH TWICE A DAY 04/27/14   Thayer Headings, MD   BP 123/93 mmHg  Pulse 98  Temp(Src) 98.1 F (36.7 C) (Oral)  Resp 16  SpO2 99% Physical Exam  Constitutional: Vital signs are normal. She appears well-developed and well-nourished. She is active and cooperative. She does not have a sickly appearance. No distress.  HENT:  Head: Normocephalic and atraumatic.  Nose: Nose normal.  Mouth/Throat: Oropharynx is clear and moist. No oropharyngeal exudate.  Eyes: Conjunctivae and  EOM are normal. Pupils are equal, round, and reactive to light.  Neck: Normal range of motion. Neck supple.  Cardiovascular: Normal rate, regular rhythm, normal heart sounds and intact distal pulses.   No murmur heard. Pulmonary/Chest: Effort normal and breath sounds normal. No respiratory distress. She has no wheezes. She exhibits no tenderness.  Abdominal: Soft. There is no tenderness. There is no rebound and no guarding.  Musculoskeletal: Normal range of motion. She exhibits no tenderness.  Lymphadenopathy:    She has no cervical adenopathy.  Neurological: She is alert. No  cranial nerve deficit. Coordination normal.  Skin: Skin is warm and dry. She is not diaphoretic. No pallor.  Psychiatric: She has a normal mood and affect. Her behavior is normal. Judgment and thought content normal.  Nursing note and vitals reviewed.   ED Course  Procedures (including critical care time) Labs Review Labs Reviewed  PROTIME-INR - Abnormal; Notable for the following:    Prothrombin Time 23.8 (*)    INR 2.10 (*)    All other components within normal limits  COMPREHENSIVE METABOLIC PANEL - Abnormal; Notable for the following:    Potassium 3.4 (*)    Total Protein 5.9 (*)    GFR calc non Af Amer 79 (*)    All other components within normal limits  CBC WITH DIFFERENTIAL/PLATELET - Abnormal; Notable for the following:    WBC 3.1 (*)    RBC 2.55 (*)    Hemoglobin 7.1 (*)    HCT 21.8 (*)    Neutro Abs 1.6 (*)    All other components within normal limits  TYPE AND SCREEN    Imaging Review No results found.   EKG Interpretation None      MDM   Final diagnoses:  Gastrointestinal hemorrhage, unspecified gastritis, unspecified gastrointestinal hemorrhage type  Anemia due to blood loss   Pt is a 80 yo F with hx of A fib (on xarelto), CAD, HTN, stromal stomach mass s/p resection, IBS, and CKD who presents with anemia and GI bleeding. Has had 3 weeks of dark brown colored stool with red blood  mixed in.  Has been worked up by PCP and GI teams and is scheduled for a colonoscopy tomorrow.  Due to xarelto use and continued GI bleeding, she has had her blood work taken several times by PCP this week.  Hb 8 Friday then down to 7.5 this AM, so she was sent to the ED for further evaluation for possible transfusion.  Her xarelto has been held since Monday (last dose Sunday PM 4/24) in anticipation of her c-scope tomorrow.  She complains of mild lightheadedness and DOE, but denies palpitations, nausea, or fatigue.  Asymptomatic at rest.  Stable BP.    Hb dropped from 7.5 this AM to 7.1 now.   Discussed the risks and benefits of transfusion with the patient and they agreed with receiving blood.  All questions were answered.  Will transfuse 1 unit.   Has been followed by [facility]   PCP is Dr. Joylene Draft at [facility], so will go to hospitalists team.   Called for admission after she received a unit of blood.  Pt is stable for the floor.  Placed on clear liquid diet per admitting team request.   Patient was seen with ED Attending, Dr. Christy Gentles, MD     Tori Milks, MD 06/25/14 [place], MD 06/26/14 [phone]

## 2014-06-25 ENCOUNTER — Encounter: Payer: Medicare Other | Admitting: Internal Medicine

## 2014-06-25 DIAGNOSIS — K2901 Acute gastritis with bleeding: Secondary | ICD-10-CM

## 2014-06-25 DIAGNOSIS — I482 Chronic atrial fibrillation: Secondary | ICD-10-CM | POA: Diagnosis not present

## 2014-06-25 DIAGNOSIS — D62 Acute posthemorrhagic anemia: Secondary | ICD-10-CM | POA: Diagnosis not present

## 2014-06-25 DIAGNOSIS — I10 Essential (primary) hypertension: Secondary | ICD-10-CM

## 2014-06-25 DIAGNOSIS — K921 Melena: Secondary | ICD-10-CM | POA: Diagnosis not present

## 2014-06-25 DIAGNOSIS — I5032 Chronic diastolic (congestive) heart failure: Secondary | ICD-10-CM | POA: Diagnosis not present

## 2014-06-25 LAB — CBC
HCT: 26.1 % — ABNORMAL LOW (ref 36.0–46.0)
Hemoglobin: 8.6 g/dL — ABNORMAL LOW (ref 12.0–15.0)
MCH: 27.6 pg (ref 26.0–34.0)
MCHC: 33 g/dL (ref 30.0–36.0)
MCV: 83.7 fL (ref 78.0–100.0)
Platelets: 211 10*3/uL (ref 150–400)
RBC: 3.12 MIL/uL — ABNORMAL LOW (ref 3.87–5.11)
RDW: 14.6 % (ref 11.5–15.5)
WBC: 3.9 10*3/uL — ABNORMAL LOW (ref 4.0–10.5)

## 2014-06-25 LAB — BASIC METABOLIC PANEL
Anion gap: 8 (ref 5–15)
BUN: 8 mg/dL (ref 6–23)
CHLORIDE: 106 mmol/L (ref 96–112)
CO2: 24 mmol/L (ref 19–32)
Calcium: 8.9 mg/dL (ref 8.4–10.5)
Creatinine, Ser: 0.75 mg/dL (ref 0.50–1.10)
GFR calc Af Amer: >90 mL/min (ref >90)
GFR calc non Af Amer: 80 mL/min — ABNORMAL LOW (ref >90)
GLUCOSE: 102 mg/dL — AB (ref 70–99)
Potassium: 3.1 mmol/L — ABNORMAL LOW (ref 3.5–5.1)
Sodium: 138 mmol/L (ref 135–145)

## 2014-06-25 LAB — TYPE AND SCREEN
ABO/RH(D): A POS
Antibody Screen: NEGATIVE
Unit division: 0

## 2014-06-25 LAB — MAGNESIUM: Magnesium: 1.7 mg/dL (ref 1.5–2.5)

## 2014-06-25 MED ORDER — LABETALOL HCL 5 MG/ML IV SOLN
5.0000 mg | INTRAVENOUS | Status: DC | PRN
Start: 2014-06-25 — End: 2014-06-26
  Administered 2014-06-25 – 2014-06-26 (×2): 5 mg via INTRAVENOUS
  Filled 2014-06-25 (×5): qty 4

## 2014-06-25 MED ORDER — ATORVASTATIN CALCIUM 20 MG PO TABS
20.0000 mg | ORAL_TABLET | Freq: Every day | ORAL | Status: DC
  Administered 2014-06-25: 20 mg via ORAL
  Filled 2014-06-25 (×2): qty 1

## 2014-06-25 MED ORDER — SODIUM CHLORIDE 0.9 % IV SOLN: INTRAVENOUS | Status: DC

## 2014-06-25 MED ORDER — AMLODIPINE BESYLATE 5 MG PO TABS
5.0000 mg | ORAL_TABLET | Freq: Every day | ORAL | Status: DC
Start: 2014-06-25 — End: 2014-06-26
  Administered 2014-06-25 – 2014-06-26 (×2): 5 mg via ORAL
  Filled 2014-06-25 (×2): qty 1

## 2014-06-25 MED ORDER — EZETIMIBE 10 MG PO TABS
5.0000 mg | ORAL_TABLET | Freq: Every day | ORAL | Status: DC
  Administered 2014-06-26: 5 mg via ORAL
  Filled 2014-06-25 (×2): qty 0.5

## 2014-06-25 MED ORDER — TRAZODONE HCL 50 MG PO TABS
50.0000 mg | ORAL_TABLET | Freq: Once | ORAL | Status: DC
  Filled 2014-06-25 (×2): qty 1

## 2014-06-25 MED ORDER — PANTOPRAZOLE SODIUM 40 MG PO TBEC
40.0000 mg | DELAYED_RELEASE_TABLET | Freq: Every day | ORAL | Status: DC
Start: 2014-06-25 — End: 2014-06-26
  Administered 2014-06-25 – 2014-06-26 (×2): 40 mg via ORAL
  Filled 2014-06-25 (×3): qty 1

## 2014-06-25 MED ORDER — METOPROLOL TARTRATE 1 MG/ML IV SOLN
5.0000 mg | Freq: Once | INTRAVENOUS | Status: AC
  Administered 2014-06-25: 5 mg via INTRAVENOUS
  Filled 2014-06-25: qty 5

## 2014-06-25 MED ORDER — POTASSIUM CHLORIDE CRYS ER 20 MEQ PO TBCR
40.0000 meq | EXTENDED_RELEASE_TABLET | Freq: Two times a day (BID) | ORAL | Status: AC
  Administered 2014-06-25 (×2): 40 meq via ORAL
  Filled 2014-06-25 (×2): qty 2

## 2014-06-25 MED ORDER — METOPROLOL SUCCINATE ER 25 MG PO TB24
25.0000 mg | ORAL_TABLET | Freq: Every day | ORAL | Status: DC
  Administered 2014-06-25 – 2014-06-26 (×2): 25 mg via ORAL
  Filled 2014-06-25 (×2): qty 1

## 2014-06-25 MED ORDER — OXYCODONE-ACETAMINOPHEN 5-325 MG PO TABS: 1.0000 | ORAL_TABLET | ORAL | Status: DC | PRN

## 2014-06-25 MED ORDER — PEG-KCL-NACL-NASULF-NA ASC-C 100 G PO SOLR
1.0000 | Freq: Once | ORAL | Status: AC
  Administered 2014-06-25: 200 g via ORAL

## 2014-06-25 NOTE — Care Management Note (Unsigned)
    Page 1 of 1   06/25/2014     3:16:25 PM CARE MANAGEMENT NOTE 06/25/2014  Patient:  [name], [name]   Account Number:  [id number]  Date Initiated:  06/25/2014  Documentation initiated by:  Tomi Bamberger  Subjective/Objective Assessment:   dx abla  admit- from home     Action/Plan:   Anticipated DC Date:  07/08/2014   Anticipated DC Plan:  [place]  CM consult      Choice offered to / List presented to:             Status of service:  In process, will continue to follow Medicare Important Message given?  NO (If response is "NO", the following Medicare IM given date fields will be blank) Date Medicare IM given:   Medicare IM given by:   Date Additional Medicare IM given:   Additional Medicare IM given by:    Discharge Disposition:    Per UR Regulation:  Reviewed for med. necessity/level of care/duration of stay  If discussed at [place] of Stay Meetings, dates discussed:    Comments:  06/25/14 [place], BSN [phone]  patient lives with spouse, NCM will cont to follow for dc needs.

## 2014-06-25 NOTE — Progress Notes (Signed)
Patient heart rate went up to 170's this morning while ambulating to bathroom.Patient assisted back to bed by nursing staff.Patient denies pain and shortness of breath.Call placed to NP [facility] .New order received and carried out.

## 2014-06-25 NOTE — Consult Note (Signed)
[facility] Consult: 8:10 AM 06/25/2014  LOS: 1 day    Referring Provider: Dr Maryland Pink  Primary Care Physician:  Jerlyn Ly, MD Primary Gastroenterologist:  Dr. Henrene Pastor     Reason for Consultation:  Anemia, FOBT+   HPI: [name] is a 80 y.o. female.  On Xarelto for chronic A fib.  Non STEMI 01/2009.   Diastolic HF.  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  .  GI studies 08/2011 for FOBT + anemia were unrevealing, no evidence of recurrent cancer.  Capsule Endoscopy entertained but was not pursued.  08/2011 Colonoscopy:  Normal study.  08/2011 EGD: for dysphagia, FOBT+, s/p GIST resection: small HH, proximal gastric resection changes, o/w normal.  11/2007 EGD: HH, prior gastric cardia surgery, o/w normal.  11/2006 EUS 11/2006 EGD: GIST.  11/2006 Colonoscopy: normal.    Seen in GI office 4/25 for loose stools with blood and declining Hgb.  Pt set up for colonoscopy at [facility] today after holding Xarelto. The stool is dark, unformed/soft but not watery.  The blood is completely mixed into the stool, not visible as clots or streaks.  It began about 4 weeks ago.  Interestingly after last dose of Xarelto on 4/24, the next stool (and also her last stool) on 4/27 was brown and not bloody at all.  Baseline 12-13, dropping to 9 - 10, than 8 on 4/25.follow up 4/28: 7.5, the same day in outpt transfusion areawas 7.1.  At that point it was decided to admit pt for the transfusion and pursue th colonoscopy as inpt.   However due to dropping Hgb she was admitted, transfused 1 unit PRBC to 8.3 and plans now for inpt GI studies.  She has felt some new onset DOE, and dizziness with positional changes.  These subside at rest.  Last night had some tachycardia after getting anxious due to bladder  incontinence episode in the bed.  No chest pain.  No presyncope.  No nose bleeds.  No unusual bruising or excessive bleeding tendency.  Takes her Nexium faithfully, no dysphagia nor GER sxs.  No NSAID/ASA.  Stable weight.      Past Medical History  Diagnosis Date  . MI, acute, non ST segment elevation Dec.7,2011    cath  . Coronary artery disease     minimal  . Hypertension   . Hyperlipidemia   . Hypokalemia   . Incontinence of urine   . Stromal tumor of the stomach     cancer  . Atrial fibrillation   . Arthritis   . GERD (gastroesophageal reflux disease)   . Hiatal hernia   . Osteopenia   . Bladder polyps   . IBS (irritable bowel syndrome)   . Cervical polyp   . Cataracts, bilateral   . IFG (impaired fasting glucose)   . OSA (obstructive sleep apnea)     hypoxia , not CO 2 retainer,   . OSA on CPAP 06/16/2013     Sleep study 1- 11-15 with an AHI of 61 , now titrated to CPAP  9 cm water with 2 cm EPR.   . Cancer     stomach  . Dysrhythmia      AF  hx     dr Acie Fredrickson  . Chronic kidney disease     urinary incont-    Past Surgical History  Procedure Laterality Date  . Stromal stomach  tumor  03/26/2007    resection  . Ankle fracture surgery  2006    right, plate   . Cardiac catheterization  12/07/20210    Dr.Nahser,minor cad  . Eye surgery      both cataracts  . Colonoscopy    . Upper gi endoscopy    . Knee arthroscopy Right 05/28/2012    Procedure: RIGHT KNEE ARTHROSCOPY PARTIAL MEDIAL AND LATERAL MENISECTOMY WITH CHONDROPLASTY;  Surgeon: Alta Corning, MD;  Location: [place];  Service: Orthopedics;  Laterality: Right;  . Total knee arthroplasty Right 08/10/2013    Procedure: TOTAL KNEE ARTHROPLASTY;  Surgeon: Alta Corning, MD;  Location: [place];  Service: Orthopedics;  Laterality: Right;    Prior to Admission medications   Medication Sig Start Date End Date Taking? Authorizing Provider  alendronate (FOSAMAX) 70 MG tablet Take 70 mg by mouth once a  week. Take with a full glass of water on an empty stomach.* every wednesday   Yes Historical Provider, MD  amLODipine (NORVASC) 5 MG tablet TAKE 1 TABLET EVERY DAY 01/25/14  Yes Thayer Headings, MD  atorvastatin (LIPITOR) 20 MG tablet Take 20 mg by mouth daily.    Yes Historical Provider, MD  CALCIUM-VITAMIN D PO Take 1 tablet by mouth daily.   Yes Historical Provider, MD  Chlorphen-Pseudoephed-APAP (CORICIDIN D PO) Take 1 capsule by mouth daily as needed (sinua).   Yes Historical Provider, MD  Cholecalciferol 10000 UNITS CAPS Take 1,000 capsules by mouth daily.   Yes Historical Provider, MD  doxazosin (CARDURA) 4 MG tablet TAKE 1 TABLET BY MOUTH EVERY DAY AT BEDTIME 12/21/13  Yes Deboraha Sprang, MD  esomeprazole (NEXIUM) 40 MG capsule Take 40 mg by mouth 4 (four) times a week.   Yes Historical Provider, MD  ezetimibe (ZETIA) 10 MG tablet Take 5 mg by mouth daily.    Yes Historical Provider, MD  hydrochlorothiazide (MICROZIDE) 12.5 MG capsule TAKE 1 CAPSULE (12.5 MG TOTAL) BY MOUTH 2 (TWO) TIMES DAILY. 04/27/14  Yes Thayer Headings, MD  KLOR-CON M10 10 MEQ tablet Take 5 mEq by mouth daily.  08/16/11  Yes Historical Provider, MD  methocarbamol (ROBAXIN) 750 MG tablet Take 750 mg by mouth every 8 (eight) hours as needed for muscle spasms.   Yes Historical Provider, MD  metoprolol succinate (TOPROL XL) 25 MG 24 hr tablet Take 1 tablet (25 mg total) by mouth daily. 01/28/14  Yes Thayer Headings, MD  Multiple Vitamin (MULTIVITAMIN) tablet Take 1 tablet by mouth daily.     Yes Historical Provider, MD  Multiple Vitamins-Minerals (ICAPS AREDS FORMULA PO) Take 1 capsule by mouth daily.   Yes Historical Provider, MD  nitroGLYCERIN (NITROSTAT) 0.4 MG SL tablet Place 1 tablet (0.4 mg total) under the tongue every 5 (five) minutes as needed for chest pain. 10/05/13  Yes Thayer Headings, MD  Nutritional Supplements (CHOICE DM FIBER-BURST PO) Take by mouth daily.    Yes Historical Provider, MD  OMEGA-3 FATTY ACIDS PO  Take 1 capsule by mouth daily.   Yes Historical Provider, MD  oxyCODONE-acetaminophen (PERCOCET/ROXICET) 5-325 MG per tablet Take 1-2 tablets by mouth  every 4 (four) hours as needed for moderate pain or severe pain.   Yes Historical Provider, MD  rivaroxaban (XARELTO) 20 MG TABS tablet Take 1 tablet (20 mg total) by mouth daily. 12/31/13  Yes Thayer Headings, MD  valsartan (DIOVAN) 160 MG tablet TAKE 1 TABLET BY MOUTH TWICE A DAY 04/27/14  Yes Thayer Headings, MD  zolpidem (AMBIEN CR) 6.25 MG CR tablet Take 6.25 mg by mouth at bedtime as needed for sleep.   Yes Historical Provider, MD    Scheduled Meds: . pantoprazole (PROTONIX) IV  40 mg Intravenous Q24H  . sodium chloride  3 mL Intravenous Q12H   Infusions: . sodium chloride 50 mL/hr at 06/24/14 2017   PRN Meds: labetalol, ondansetron **OR** ondansetron (ZOFRAN) IV   Allergies as of 06/24/2014 - Review Complete 06/24/2014  Allergen Reaction Noted  . Pradaxa [dabigatran etexilate mesylate] Nausea Only 10/30/2011  . Tranexamic acid  08/07/2013    Family History  Problem Relation Age of Onset  . Pancreatic cancer Paternal Grandmother   . Diabetes Paternal Aunt   . Heart disease Paternal Grandfather     both sides of the family    History   Social History  . Marital Status: Married    Spouse Name: Joneen Boers  . Number of Children: 2  . Years of Education: Masters   Occupational History  . retired    Social History Main Topics  . Smoking status: Never Smoker   . Smokeless tobacco: Never Used  . Alcohol Use: 1.2 oz/week    2 Glasses of wine per week     Comment: one per week  . Drug Use: No  . Sexual Activity: No   Other Topics Concern  . Not on file   Social History Narrative   Patient is married Joneen Boers) and lives at home with her husband.   Patient is retired.   Patient has a Oceanographer in Education   Patient is right-handed.   Patient does not drink any caffeine.   Patient has one living child and one child is  deceased.    REVIEW OF SYSTEMS: Constitutional:  No weight loss.  ENT:  No nose bleeds Pulm:  Per HPI CV:  No palpitations, no LE edema.  GU:  No hematuria, + bladder incontinence.  GI:  Per HPI Heme:  Per HPI   Transfusions:  Never transfused until 4/28.  Neuro:  No headaches, no peripheral tingling or numbness Derm:  No itching, no rash or sores.  Endocrine:  No sweats or chills.  No polyuria or dysuria Immunization:  Not querried.  Travel:  None beyond local counties in last few months.    PHYSICAL EXAM: Vital signs in last 24 hours: Filed Vitals:   06/25/14 0620  BP: 151/73  Pulse: 120  Temp:   Resp:    Wt Readings from Last 3 Encounters:  06/25/14 154 lb 12.2 oz (70.2 kg)  06/21/14 160 lb 2 oz (72.632 kg)  05/21/14 158 lb 12.8 oz (72.031 kg)    General: Pleasant, somewhat anxious but fully alert and comfortable WF appearing her stated age. Overall she looks healthy Head:  No facial asymmetry or swelling. No signs of head trauma  Eyes:  No scleral icterus, no conjunctival pallor. EOMI.  Ears:  No gross hearing deficit  Nose:  No congestion or discharge Mouth:  Clear, moist oral mucous membranes. Good dentition. Tongue is midline. Neck:  No JVD, no bruits. No TMG or masses. Lungs:  CTA bilaterally. No  dyspnea, no cough. Heart: Irregularly irregular. Rate controlled and not tachycardic or bradycardic. No MRG. Abdomen:  Soft,NT, ND, no HSM, no masses, no bruits, no hernias..   Rectal: Some formed stool in the vault. Small specimen obtained and is brown but tests trace FOBT positive.  No visible blood. No melena. No masses. No hemorrhoids   Musc/Skeltl: Postoperative right knee scarring. No joint erythema or edema. No contractures Extremities:  No CCE.  Neurologic:  Oriented 3. Alert. Moves all 4 limbs easily. No gross deficits. No limb strength weakness. Skin:  No rashes, no sores, no telangiectasia. Tattoos:  None Nodes:  No cervical adenopathy.   Psych:   Slightly anxious but very pleasant and able to provide an excellent history.  Intake/Output from previous day: 04/28 0701 - 04/29 0700 In: 514.2 [I.V.:514.2] Out: 675 [Urine:675] Intake/Output this shift:    LAB RESULTS:  Recent Labs  06/24/14 1318 06/24/14 2213  WBC 3.1* 4.3  HGB 7.1* 8.3*  HCT 21.8* 25.1*  PLT 187 206  MCV     85  BMET Lab Results  Component Value Date   NA 135 06/24/2014   NA 134* 06/21/2014   NA 135* 08/11/2013   K 3.4* 06/24/2014   K 3.7 06/21/2014   K 3.4* 08/11/2013   CL 104 06/24/2014   CL 101 06/21/2014   CL 97 08/11/2013   CO2 24 06/24/2014   CO2 30 06/21/2014   CO2 25 08/11/2013   GLUCOSE 98 06/24/2014   GLUCOSE 92 06/21/2014   GLUCOSE 135* 08/11/2013   BUN 17 06/24/2014   BUN 23 06/21/2014   BUN 14 08/11/2013   CREATININE 0.76 06/24/2014   CREATININE 0.80 06/21/2014   CREATININE 0.63 08/11/2013   CALCIUM 9.2 06/24/2014   CALCIUM 9.2 06/21/2014   CALCIUM 8.5 08/11/2013   LFT  Recent Labs  06/24/14 1318  PROT 5.9*  ALBUMIN 3.5  AST 17  ALT 10  ALKPHOS 40  BILITOT 0.9   PT/INR Lab Results  Component Value Date   INR 2.10* 06/24/2014   INR 1.11 08/07/2013   INR 1.49 07/28/2013    RADIOLOGY STUDIES: No results found.  ENDOSCOPIC STUDIES: See HPI.   IMPRESSION:   *  Blood in stool, Anemia, in pt on Xarelto.  Outpt colonoscopy was set for today. After getting history, am not convinced this is LGIB.  Added enteroscopy and capsule endo to permit slip in case these are needed.  She had workup of anemia and FOBT + in 08/2011, and though was not grossly bleeding then, the studies were unrevealing.  I suspect she may have SB AVMs which are the culprit. Also takes Fosamax once a week which can cause esophageal injury, but her sxs are negative for suggestion of esophagitis.  Very low likelihood that this is ulcer or reflux related disease given negative previous EGD findings and daily Nexium. Does have history of resected GIST  but low likelihood that she has a recurrence.   *  Blood loss anemia.  S/p 1 PRBC transfusion.   *  Chronic Xarelto for A fib.  Has been on holdfor 2 days with ok from cardiologist, Dr Acie Fredrickson.   *  S/p 02/2007 proximal stomach GIST removal.  No evidence recurrence on 2009 or 2013 EGDs.  *  Mild hyponatremia and hypokalemia.    PLAN:     *  Colonoscopy, and if no source for bleeding, then enteroscopy and possible capsule endo.  Timing set for 4/30.  Complete split dose movi  prep.  Clears today.  Oral PPI.  Patient and husband in agreement and eager to proceed.   Azucena Freed  06/25/2014, 8:10 AM Pager: [phone]

## 2014-06-25 NOTE — Progress Notes (Signed)
UR completed 

## 2014-06-25 NOTE — Progress Notes (Signed)
[facility] PROGRESS NOTE  [name] TRR:[id number] DOB: 1934/03/05 DOA: 06/24/2014 PCP: Jerlyn Ly, MD  Assessment/Plan: 1. Acute blood loss anemia 1. GI consulted with plans for endoscopy on 4/30 2. Xarelto is on hold for now 3. Pt is s/p 2 units PRBC's 4. Follow cbc and cont to transfuse as needed 2. Chronic diastolic chf 1. Clinically euvolemic 2. Continue to monitor 3. Chronic afib 1. Earlier today, pt was noted to be in RVR 2. Meds were held on admit secondary to questionable borderline low BP 3. BP now stable and metoprolol resumed 4. Have added PRN IV labetalol for added rate control 4. OSA 1. On CPAP 5. HTN 1. BP stable 2. Home meds resumed 6. DVT prophylaxis 1. SCD's  Code Status: Full Family Communication: Pt in room, family at bedside (indicate person spoken with, relationship, and if by phone, the number) Disposition Plan:    Consultants:  GI  Procedures:    Antibiotics:   (indicate start date, and stop date if known)  HPI/Subjective: No complaints. No abd pain or sob  Objective: Filed Vitals:   06/25/14 0535 06/25/14 0555 06/25/14 0620 06/25/14 1435  BP:  141/82 151/73 112/41  Pulse:  120 120 100  Temp:  98.4 F (36.9 C)  98.1 F (36.7 C)  TempSrc:  Oral  Oral  Resp:  16  18  Weight: 70.2 kg (154 lb 12.2 oz)     SpO2:  92% 94% 96%    Intake/Output Summary (Last 24 hours) at 06/25/14 1830 Last data filed at 06/25/14 1802  Gross per 24 hour  Intake 1523.84 ml  Output    675 ml  Net 848.84 ml   Filed Weights   06/25/14 0535  Weight: 70.2 kg (154 lb 12.2 oz)    Exam:   General:  Awake, sitting in chair, in nad  Cardiovascular: irregularly irregular, s1, s2  Respiratory: normal resp effort, no wheezing  Abdomen: soft,nondistended  Musculoskeletal: perfused, no clubbing   Data Reviewed: Basic Metabolic Panel:  Recent Labs Lab 06/21/14 1614 06/24/14 1318 06/25/14 0803 06/25/14 1220  NA 134* 135 138  --    K 3.7 3.4* 3.1*  --   CL 101 104 106  --   CO2 30 24 24   --   GLUCOSE 92 98 102*  --   BUN 23 17 8   --   CREATININE 0.80 0.76 0.75  --   CALCIUM 9.2 9.2 8.9  --   MG  --   --   --  1.7   Liver Function Tests:  Recent Labs Lab 06/24/14 1318  AST 17  ALT 10  ALKPHOS 40  BILITOT 0.9  PROT 5.9*  ALBUMIN 3.5   No results for input(s): LIPASE, AMYLASE in the last 168 hours. No results for input(s): AMMONIA in the last 168 hours. CBC:  Recent Labs Lab 06/21/14 1614 06/24/14 1318 06/24/14 2213 06/25/14 0803  WBC 4.4 3.1* 4.3 3.9*  NEUTROABS  --  1.6*  --   --   HGB 8.0 Repeated and verified X2.* 7.1* 8.3* 8.6*  HCT 24.0* 21.8* 25.1* 26.1*  MCV 84.7 85.5 83.7 83.7  PLT 211.0 187 206 211   Cardiac Enzymes: No results for input(s): CKTOTAL, CKMB, CKMBINDEX, TROPONINI in the last 168 hours. BNP (last 3 results)  Recent Labs  06/24/14 2127  BNP 401.1*    ProBNP (last 3 results) No results for input(s): PROBNP in the last 8760 hours.  CBG: No results for input(s): GLUCAP  in the last 168 hours.  No results found for this or any previous visit (from the past 240 hour(s)).   Studies: No results found.  Scheduled Meds: . amLODipine  5 mg Oral Daily  . atorvastatin  20 mg Oral q1800  . ezetimibe  5 mg Oral Daily  . metoprolol succinate  25 mg Oral Daily  . pantoprazole  40 mg Oral Q0600  . potassium chloride  40 mEq Oral BID  . sodium chloride  3 mL Intravenous Q12H  . traZODone  50 mg Oral Once   Continuous Infusions: . sodium chloride 1,000 mL (06/25/14 1630)  . sodium chloride      Principal Problem:   Acute blood loss anemia Active Problems:   GI bleed   Hypertension   Chronic diastolic heart failure   Chronic atrial fibrillation   Sleep apnea   GIB (gastrointestinal bleeding)   [name] K  [facility] Pager [phone]. If 7PM-7AM, please contact night-coverage at [email or web address], password [facility] 06/25/2014, 6:30 PM  LOS: 1 day

## 2014-06-26 ENCOUNTER — Encounter (HOSPITAL_COMMUNITY)
Admission: EM | Disposition: A | Discharge status: Home / Self Care | Payer: Medicare Other | Source: Home / Self Care | Attending: Internal Medicine

## 2014-06-26 ENCOUNTER — Encounter (HOSPITAL_COMMUNITY): Payer: Self-pay

## 2014-06-26 DIAGNOSIS — D62 Acute posthemorrhagic anemia: Secondary | ICD-10-CM | POA: Diagnosis not present

## 2014-06-26 DIAGNOSIS — I5032 Chronic diastolic (congestive) heart failure: Secondary | ICD-10-CM | POA: Diagnosis not present

## 2014-06-26 DIAGNOSIS — K644 Residual hemorrhoidal skin tags: Secondary | ICD-10-CM

## 2014-06-26 DIAGNOSIS — K648 Other hemorrhoids: Secondary | ICD-10-CM

## 2014-06-26 DIAGNOSIS — I482 Chronic atrial fibrillation: Secondary | ICD-10-CM | POA: Diagnosis not present

## 2014-06-26 DIAGNOSIS — I10 Essential (primary) hypertension: Secondary | ICD-10-CM | POA: Diagnosis not present

## 2014-06-26 HISTORY — PX: COLONOSCOPY: SHX5424

## 2014-06-26 HISTORY — PX: ENTEROSCOPY: SHX5533

## 2014-06-26 LAB — BASIC METABOLIC PANEL
Anion gap: 10 (ref 5–15)
BUN: 7 mg/dL (ref 6–23)
CO2: 18 mmol/L — AB (ref 19–32)
CREATININE: 0.71 mg/dL (ref 0.50–1.10)
Calcium: 8.9 mg/dL (ref 8.4–10.5)
Chloride: 105 mmol/L (ref 96–112)
GFR calc Af Amer: >90 mL/min (ref >90)
GFR calc non Af Amer: 81 mL/min — ABNORMAL LOW (ref >90)
Glucose, Bld: 94 mg/dL (ref 70–99)
Potassium: 3.7 mmol/L (ref 3.5–5.1)
Sodium: 133 mmol/L — ABNORMAL LOW (ref 135–145)

## 2014-06-26 LAB — HEMOGLOBIN AND HEMATOCRIT, BLOOD
HEMATOCRIT: 30 % — AB (ref 36.0–46.0)
Hemoglobin: 9.7 g/dL — ABNORMAL LOW (ref 12.0–15.0)

## 2014-06-26 SURGERY — COLONOSCOPY
Anesthesia: Moderate Sedation

## 2014-06-26 MED ORDER — METOPROLOL SUCCINATE ER 50 MG PO TB24: 50.0000 mg | ORAL_TABLET | Freq: Every day | ORAL | Status: DC

## 2014-06-26 MED ORDER — BUTAMBEN-TETRACAINE-BENZOCAINE 2-2-14 % EX AERO
INHALATION_SPRAY | CUTANEOUS | Status: DC | PRN
  Administered 2014-06-26: 2 via TOPICAL

## 2014-06-26 MED ORDER — MAGNESIUM SULFATE 2 GM/50ML IV SOLN
2.0000 g | Freq: Once | INTRAVENOUS | Status: AC
  Administered 2014-06-26: 2 g via INTRAVENOUS
  Filled 2014-06-26 (×2): qty 50

## 2014-06-26 MED ORDER — METOPROLOL SUCCINATE ER 25 MG PO TB24
25.0000 mg | ORAL_TABLET | ORAL | Status: AC
  Administered 2014-06-26: 25 mg via ORAL
  Filled 2014-06-26: qty 1

## 2014-06-26 MED ORDER — FENTANYL CITRATE (PF) 100 MCG/2ML IJ SOLN
INTRAMUSCULAR | Status: AC
  Filled 2014-06-26: qty 2

## 2014-06-26 MED ORDER — MIDAZOLAM HCL 5 MG/ML IJ SOLN
INTRAMUSCULAR | Status: AC
  Filled 2014-06-26: qty 2

## 2014-06-26 MED ORDER — MIDAZOLAM HCL 5 MG/5ML IJ SOLN
INTRAMUSCULAR | Status: DC | PRN
  Administered 2014-06-26: 1 mg via INTRAVENOUS
  Administered 2014-06-26: 2 mg via INTRAVENOUS
  Administered 2014-06-26 (×2): 1 mg via INTRAVENOUS

## 2014-06-26 MED ORDER — FENTANYL CITRATE (PF) 100 MCG/2ML IJ SOLN
INTRAMUSCULAR | Status: DC | PRN
  Administered 2014-06-26: 25 ug via INTRAVENOUS
  Administered 2014-06-26: 12.5 ug via INTRAVENOUS
  Administered 2014-06-26: 25 ug via INTRAVENOUS

## 2014-06-26 SURGICAL SUPPLY — 1 items: TOWEL COTTON PACK 4EA (MISCELLANEOUS) ×6 IMPLANT

## 2014-06-26 NOTE — Progress Notes (Signed)
[facility] PROGRESS NOTE  [name] KTG:[id number] DOB: [birth year] DOA: 06/24/2014 PCP: Jerlyn Ly, MD  Assessment/Plan: 1. Acute blood loss anemia 1. GI consulted with plans for endoscopy today, pending findings 2. Xarelto is on hold for now 3. Pt is s/p 2 units PRBC's 4. Follow cbc and cont to transfuse as needed 2. Chronic diastolic chf 1. Clinically euvolemic 2. Continue to monitor 3. Chronic afib 1. Earlier today, pt was noted to be in RVR 2. Meds were held on admit secondary to questionable borderline low BP 3. BP stable and metoprolol resumed with PRN IV labetalol for added rate control 4. HR remains in the low 100's. Will increase metoprolol to 50mg  daily 4. OSA 1. On CPAP 5. HTN 1. BP stable 2. Home meds resumed 6. DVT prophylaxis 1. SCD's  Code Status: Full Family Communication: Pt in room, family at bedside  Disposition Plan:    Consultants:  GI  Procedures:    Antibiotics:    HPI/Subjective: Pt is without complaints this AM. Denies abd pain or cp  Objective: Filed Vitals:   06/26/14 0936 06/26/14 1311 06/26/14 1359 06/26/14 1409  BP: 137/77 137/57 159/94 159/86  Pulse: 82 104 100 91  Temp:  98.3 F (36.8 C) 98.3 F (36.8 C)   TempSrc:  Oral Oral   Resp:  14 23 21   Weight:      SpO2:  96% 97% 98%    Intake/Output Summary (Last 24 hours) at 06/26/14 1501 Last data filed at 06/26/14 1341  Gross per 24 hour  Intake   1260 ml  Output    200 ml  Net   1060 ml   Filed Weights   06/25/14 0535 06/26/14 0456  Weight: 70.2 kg (154 lb 12.2 oz) 66.996 kg (147 lb 11.2 oz)    Exam:   General:  Awake, laying in bed, in nad  Cardiovascular: irregularly irregular, s1, s2, mildly tachycardic  Respiratory: normal resp effort, no wheezing  Abdomen: soft,nondistended, nontender  Musculoskeletal: perfused, no clubbing   Data Reviewed: Basic Metabolic Panel:  Recent Labs Lab 06/21/14 1614 06/24/14 1318 06/25/14 0803  06/25/14 1220 06/26/14 1309  NA 134* 135 138  --  133*  K 3.7 3.4* 3.1*  --  3.7  CL 101 104 106  --  105  CO2 30 24 24   --  18*  GLUCOSE 92 98 102*  --  94  BUN 23 17 8   --  7  CREATININE 0.80 0.76 0.75  --  0.71  CALCIUM 9.2 9.2 8.9  --  8.9  MG  --   --   --  1.7  --    Liver Function Tests:  Recent Labs Lab 06/24/14 1318  AST 17  ALT 10  ALKPHOS 40  BILITOT 0.9  PROT 5.9*  ALBUMIN 3.5   No results for input(s): LIPASE, AMYLASE in the last 168 hours. No results for input(s): AMMONIA in the last 168 hours. CBC:  Recent Labs Lab 06/21/14 1614 06/24/14 1318 06/24/14 2213 06/25/14 0803 06/26/14 0533  WBC 4.4 3.1* 4.3 3.9*  --   NEUTROABS  --  1.6*  --   --   --   HGB 8.0 Repeated and verified X2.* 7.1* 8.3* 8.6* 9.7*  HCT 24.0* 21.8* 25.1* 26.1* 30.0*  MCV 84.7 85.5 83.7 83.7  --   PLT 211.0 187 206 211  --    Cardiac Enzymes: No results for input(s): CKTOTAL, CKMB, CKMBINDEX, TROPONINI in the last 168 hours. BNP (  last 3 results)  Recent Labs  06/24/14 2127  BNP 401.1*    ProBNP (last 3 results) No results for input(s): PROBNP in the last 8760 hours.  CBG: No results for input(s): GLUCAP in the last 168 hours.  No results found for this or any previous visit (from the past 240 hour(s)).   Studies: No results found.  Scheduled Meds: . amLODipine  5 mg Oral Daily  . atorvastatin  20 mg Oral q1800  . ezetimibe  5 mg Oral Daily  . [START ON 06/27/2014] metoprolol succinate  50 mg Oral Daily  . pantoprazole  40 mg Oral Q0600  . sodium chloride  3 mL Intravenous Q12H  . traZODone  50 mg Oral Once   Continuous Infusions: . sodium chloride 50 mL/hr at 06/26/14 1141  . sodium chloride      Principal Problem:   Acute blood loss anemia Active Problems:   GI bleed   Hypertension   Chronic diastolic heart failure   Chronic atrial fibrillation   Sleep apnea   GIB (gastrointestinal bleeding)   [name]  [facility] Pager [phone].  If 7PM-7AM, please contact night-coverage at [email or web address], password [facility] 02/21/202016, 3:01 PM  LOS: 2 days

## 2014-06-26 NOTE — Progress Notes (Signed)
Awaken patient and second part of colonoscopy prep given to patient.

## 2014-06-26 NOTE — Interval H&P Note (Signed)
History and Physical Interval Note:  2020-06-1214 2:39 PM  [name]  has presented today for surgery, with the diagnosis of anemia and blood in stool  The various methods of treatment have been discussed with the patient and family. After consideration of risks, benefits and other options for treatment, the patient has consented to  Procedure(s): COLONOSCOPY (N/A) ENTEROSCOPY (N/A) GIVENS CAPSULE STUDY (N/A) as a surgical intervention .  The patient's history has been reviewed, patient examined, no change in status, stable for surgery.  I have reviewed the patient's chart and labs.  Questions were answered to the patient's satisfaction.     The recent H&P (dated *06/25/14**) was reviewed, the patient was examined and there is no change in the patients condition since that H&P was completed.   [name]  2020-06-1214, 2:39 PM    [name]

## 2014-06-26 NOTE — Progress Notes (Signed)
Colonoscopy was negative except for internal and stroke hemorrhoids.  Enteroscopy was normal.  Patient's bleeding certainly could be from hemorrhoids.  Plan outpatient capsule study to rule out a small bowel bleeding source.  Can carefully resume Xarelto

## 2014-06-26 NOTE — Op Note (Signed)
[facility] [place], [id number]   COLONOSCOPY PROCEDURE REPORT     EXAM DATE: Jul 22, 2014  PATIENT NAME:      [name], [name]           MR #:      [id number]  BIRTHDATE:       Aug 09, 1934      VISIT #:     [phone]  ATTENDING:     Inda Castle, MD     STATUS:     inpatient ASSISTANT:      Laverta Baltimore and Corliss Parish   INDICATIONS:  The patient is a 80 yr old female here for a colonoscopy due to hematochezia. PROCEDURE PERFORMED:     Colonoscopy, diagnostic MEDICATIONS:     Versed 5 mg IV and Fentanyl 50 mcg IV ESTIMATED BLOOD LOSS:     None  CONSENT: The patient understands the risks and benefits of the procedure and understands that these risks include, but are not limited to: sedation, allergic reaction, infection, perforation and/or bleeding. Alternative means of evaluation and treatment include, among others: physical exam, x-rays, and/or surgical intervention. The patient elects to proceed with this endoscopic procedure.  DESCRIPTION OF PROCEDURE: During intra-op preparation period all mechanical & medical equipment was checked for proper function. Hand hygiene and appropriate measures for infection prevention was taken. After the risks, benefits and alternatives of the procedure were thoroughly explained, Informed consent was verified, confirmed and timeout was successfully executed by the treatment team. A digital exam revealed external hemorrhoids. The Pentax Adult Colon [id number] endoscope was introduced through the anus and advanced to the cecum, which was identified by both the appendix and ileocecal valve. (MiraLax was used) good. The instrument was then slowly withdrawn as the colon was fully examined.Estimated blood loss is zero unless otherwise noted in this procedure report.   COLON FINDINGS: Internal hemorrhoids were found.   The examination was otherwise normal. Retroflexed views revealed no  abnormalities. The scope was then completely withdrawn from the patient and the procedure terminated. SCOPE WITHDRAWAL TIME:    ADVERSE EVENTS:      There were no immediate complications.  IMPRESSIONS:     1.  Internal hemorrhoids 2.  The examination was otherwise normal  RECOMMENDATIONS:     EGD/enteroscopy RECALL:  _____________________________ Inda Castle, MD eSigned:  Inda Castle, MD 2014-07-22 3:36 PM   cc:   CPT CODES: ICD CODES:  The ICD and CPT codes recommended by this software are interpretations from the data that the clinical staff has captured with the software.  The verification of the translation of this report to the ICD and CPT codes and modifiers is the sole responsibility of the health care institution and practicing physician where this report was generated.  [employer]. will not be held responsible for the validity of the ICD and CPT codes included on this report.  AMA assumes no liability for data contained or not contained herein. CPT is a Designer, television/film set of the [employer].

## 2014-06-26 NOTE — Progress Notes (Signed)
Patient heart rate increasing with activity.Heart rate up 140-150 while getting up to bedside commode rhythm atrial fibrillation.Patient denies chest pain,shortness of breath and palpitations.Prn dose of labetalol given .Will continue to monitor patient.

## 2014-06-26 NOTE — H&P (View-Only) (Signed)
Gastroenterology Consult: 8:10 AM 06/25/2014  LOS: 1 day    Referring Provider: Dr [place] Pink  Primary Care Physician:  Jerlyn Ly, MD Primary Gastroenterologist:  Dr. Henrene Pastor     Reason for Consultation:  Anemia, FOBT+   HPI: [name] is a 80 y.o. female.  On Xarelto for chronic A fib.  Non STEMI 01/2009.   Diastolic HF.  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  .  GI studies 08/2011 for FOBT + anemia were unrevealing, no evidence of recurrent cancer.  Capsule Endoscopy entertained but was not pursued.  08/2011 Colonoscopy:  Normal study.  08/2011 EGD: for dysphagia, FOBT+, s/p GIST resection: small HH, proximal gastric resection changes, o/w normal.  11/2007 EGD: HH, prior gastric cardia surgery, o/w normal.  11/2006 EUS 11/2006 EGD: GIST.  11/2006 Colonoscopy: normal.    Seen in GI office 4/25 for loose stools with blood and declining Hgb.  Pt set up for colonoscopy at [facility] today after holding Xarelto. The stool is dark, unformed/soft but not watery.  The blood is completely mixed into the stool, not visible as clots or streaks.  It began about 4 weeks ago.  Interestingly after last dose of Xarelto on 4/24, the next stool (and also her last stool) on 4/27 was brown and not bloody at all.  Baseline 12-13, dropping to 9 - 10, than 8 on 4/25.follow up 4/28: 7.5, the same day in outpt transfusion [place] 7.1.  At that point it was decided to admit pt for the transfusion and pursue th colonoscopy as inpt.   However due to dropping Hgb she was admitted, transfused 1 unit PRBC to 8.3 and plans now for inpt GI studies.  She has felt some new onset DOE, and dizziness with positional changes.  These subside at rest.  Last night had some tachycardia after getting anxious due to bladder  incontinence episode in the bed.  No chest pain.  No presyncope.  No nose bleeds.  No unusual bruising or excessive bleeding tendency.  Takes her Nexium faithfully, no dysphagia nor GER sxs.  No NSAID/ASA.  Stable weight.      Past Medical History  Diagnosis Date  . MI, acute, non ST segment elevation Dec.7,2011    cath  . Coronary artery disease     minimal  . Hypertension   . Hyperlipidemia   . Hypokalemia   . Incontinence of urine   . Stromal tumor of the stomach     cancer  . Atrial fibrillation   . Arthritis   . GERD (gastroesophageal reflux disease)   . Hiatal hernia   . Osteopenia   . Bladder polyps   . IBS (irritable bowel syndrome)   . Cervical polyp   . Cataracts, bilateral   . IFG (impaired fasting glucose)   . OSA (obstructive sleep apnea)     hypoxia , not CO 2 retainer,   . OSA on CPAP 06/16/2013     Sleep study 1- 11-15 with an AHI of 61 , now titrated to CPAP  9 cm water with 2 cm EPR.   . Cancer     stomach  . Dysrhythmia      AF  hx     dr Acie Fredrickson  . Chronic kidney disease     urinary incont-    Past Surgical History  Procedure Laterality Date  . Stromal stomach  tumor  03/26/2007    resection  . Ankle fracture surgery  2006    right, plate   . Cardiac catheterization  12/07/20210    Dr.Nahser,minor cad  . Eye surgery      both cataracts  . Colonoscopy    . Upper gi endoscopy    . Knee arthroscopy Right 05/28/2012    Procedure: RIGHT KNEE ARTHROSCOPY PARTIAL MEDIAL AND LATERAL MENISECTOMY WITH CHONDROPLASTY;  Surgeon: Alta Corning, MD;  Location: [place];  Service: Orthopedics;  Laterality: Right;  . Total knee arthroplasty Right 08/10/2013    Procedure: TOTAL KNEE ARTHROPLASTY;  Surgeon: Alta Corning, MD;  Location: [place];  Service: Orthopedics;  Laterality: Right;    Prior to Admission medications   Medication Sig Start Date End Date Taking? Authorizing Provider  alendronate (FOSAMAX) 70 MG tablet Take 70 mg by mouth once a  week. Take with a full glass of water on an empty stomach.* every wednesday   Yes Historical Provider, MD  amLODipine (NORVASC) 5 MG tablet TAKE 1 TABLET EVERY DAY 01/25/14  Yes Thayer Headings, MD  atorvastatin (LIPITOR) 20 MG tablet Take 20 mg by mouth daily.    Yes Historical Provider, MD  CALCIUM-VITAMIN D PO Take 1 tablet by mouth daily.   Yes Historical Provider, MD  Chlorphen-Pseudoephed-APAP (CORICIDIN D PO) Take 1 capsule by mouth daily as needed (sinua).   Yes Historical Provider, MD  Cholecalciferol 10000 UNITS CAPS Take 1,000 capsules by mouth daily.   Yes Historical Provider, MD  doxazosin (CARDURA) 4 MG tablet TAKE 1 TABLET BY MOUTH EVERY DAY AT BEDTIME 12/21/13  Yes Deboraha Sprang, MD  esomeprazole (NEXIUM) 40 MG capsule Take 40 mg by mouth 4 (four) times a week.   Yes Historical Provider, MD  ezetimibe (ZETIA) 10 MG tablet Take 5 mg by mouth daily.    Yes Historical Provider, MD  hydrochlorothiazide (MICROZIDE) 12.5 MG capsule TAKE 1 CAPSULE (12.5 MG TOTAL) BY MOUTH 2 (TWO) TIMES DAILY. 04/27/14  Yes Thayer Headings, MD  KLOR-CON M10 10 MEQ tablet Take 5 mEq by mouth daily.  08/16/11  Yes Historical Provider, MD  methocarbamol (ROBAXIN) 750 MG tablet Take 750 mg by mouth every 8 (eight) hours as needed for muscle spasms.   Yes Historical Provider, MD  metoprolol succinate (TOPROL XL) 25 MG 24 hr tablet Take 1 tablet (25 mg total) by mouth daily. 01/28/14  Yes Thayer Headings, MD  Multiple Vitamin (MULTIVITAMIN) tablet Take 1 tablet by mouth daily.     Yes Historical Provider, MD  Multiple Vitamins-Minerals (ICAPS AREDS FORMULA PO) Take 1 capsule by mouth daily.   Yes Historical Provider, MD  nitroGLYCERIN (NITROSTAT) 0.4 MG SL tablet Place 1 tablet (0.4 mg total) under the tongue every 5 (five) minutes as needed for chest pain. 10/05/13  Yes Thayer Headings, MD  Nutritional Supplements (CHOICE DM FIBER-BURST PO) Take by mouth daily.    Yes Historical Provider, MD  OMEGA-3 FATTY ACIDS PO  Take 1 capsule by mouth daily.   Yes Historical Provider, MD  oxyCODONE-acetaminophen (PERCOCET/ROXICET) 5-325 MG per tablet Take 1-2 tablets by mouth  every 4 (four) hours as needed for moderate pain or severe pain.   Yes Historical Provider, MD  rivaroxaban (XARELTO) 20 MG TABS tablet Take 1 tablet (20 mg total) by mouth daily. 12/31/13  Yes Thayer Headings, MD  valsartan (DIOVAN) 160 MG tablet TAKE 1 TABLET BY MOUTH TWICE A DAY 04/27/14  Yes Thayer Headings, MD  zolpidem (AMBIEN CR) 6.25 MG CR tablet Take 6.25 mg by mouth at bedtime as needed for sleep.   Yes Historical Provider, MD    Scheduled Meds: . pantoprazole (PROTONIX) IV  40 mg Intravenous Q24H  . sodium chloride  3 mL Intravenous Q12H   Infusions: . sodium chloride 50 mL/hr at 06/24/14 2017   PRN Meds: labetalol, ondansetron **OR** ondansetron (ZOFRAN) IV   Allergies as of 06/24/2014 - Review Complete 06/24/2014  Allergen Reaction Noted  . Pradaxa [dabigatran etexilate mesylate] Nausea Only 10/30/2011  . Tranexamic acid  08/07/2013    Family History  Problem Relation Age of Onset  . Pancreatic cancer Paternal Grandmother   . Diabetes Paternal Aunt   . Heart disease Paternal Grandfather     both sides of the family    History   Social History  . Marital Status: Married    Spouse Name: Joneen Boers  . Number of Children: 2  . Years of Education: Masters   Occupational History  . retired    Social History Main Topics  . Smoking status: Never Smoker   . Smokeless tobacco: Never Used  . Alcohol Use: 1.2 oz/week    2 Glasses of wine per week     Comment: one per week  . Drug Use: No  . Sexual Activity: No   Other Topics Concern  . Not on file   Social History Narrative   Patient is married Joneen Boers) and lives at home with her husband.   Patient is retired.   Patient has a Oceanographer in Education   Patient is right-handed.   Patient does not drink any caffeine.   Patient has one living child and one child is  deceased.    REVIEW OF SYSTEMS: Constitutional:  No weight loss.  ENT:  No nose bleeds Pulm:  Per HPI CV:  No palpitations, no LE edema.  GU:  No hematuria, + bladder incontinence.  GI:  Per HPI Heme:  Per HPI   Transfusions:  Never transfused until 4/28.  Neuro:  No headaches, no peripheral tingling or numbness Derm:  No itching, no rash or sores.  Endocrine:  No sweats or chills.  No polyuria or dysuria Immunization:  Not querried.  Travel:  None beyond local counties in last few months.    PHYSICAL EXAM: Vital signs in last 24 hours: Filed Vitals:   06/25/14 0620  BP: 151/73  Pulse: 120  Temp:   Resp:    Wt Readings from Last 3 Encounters:  06/25/14 154 lb 12.2 oz (70.2 kg)  06/21/14 160 lb 2 oz (72.632 kg)  05/21/14 158 lb 12.8 oz (72.031 kg)    General: Pleasant, somewhat anxious but fully alert and comfortable WF appearing her stated age. Overall she looks healthy Head:  No facial asymmetry or swelling. No signs of head trauma  Eyes:  No scleral icterus, no conjunctival pallor. EOMI.  Ears:  No gross hearing deficit  Nose:  No congestion or discharge Mouth:  Clear, moist oral mucous membranes. Good dentition. Tongue is midline. Neck:  No JVD, no bruits. No TMG or masses. Lungs:  CTA bilaterally. No  dyspnea, no cough. Heart: Irregularly irregular. Rate controlled and not tachycardic or bradycardic. No MRG. Abdomen:  Soft,NT, ND, no HSM, no masses, no bruits, no hernias..   Rectal: Some formed stool in the vault. Small specimen obtained and is brown but tests trace FOBT positive.  No visible blood. No melena. No masses. No hemorrhoids   Musc/Skeltl: Postoperative right knee scarring. No joint erythema or edema. No contractures Extremities:  No CCE.  Neurologic:  Oriented 3. Alert. Moves all 4 limbs easily. No gross deficits. No limb strength weakness. Skin:  No rashes, no sores, no telangiectasia. Tattoos:  None Nodes:  No cervical adenopathy.   Psych:   Slightly anxious but very pleasant and able to provide an excellent history.  Intake/Output from previous day: 04/28 0701 - 04/29 0700 In: 514.2 [I.V.:514.2] Out: 675 [Urine:675] Intake/Output this shift:    LAB RESULTS:  Recent Labs  06/24/14 1318 06/24/14 2213  WBC 3.1* 4.3  HGB 7.1* 8.3*  HCT 21.8* 25.1*  PLT 187 206  MCV     85  BMET Lab Results  Component Value Date   NA 135 06/24/2014   NA 134* 06/21/2014   NA 135* 08/11/2013   K 3.4* 06/24/2014   K 3.7 06/21/2014   K 3.4* 08/11/2013   CL 104 06/24/2014   CL 101 06/21/2014   CL 97 08/11/2013   CO2 24 06/24/2014   CO2 30 06/21/2014   CO2 25 08/11/2013   GLUCOSE 98 06/24/2014   GLUCOSE 92 06/21/2014   GLUCOSE 135* 08/11/2013   BUN 17 06/24/2014   BUN 23 06/21/2014   BUN 14 08/11/2013   CREATININE 0.76 06/24/2014   CREATININE 0.80 06/21/2014   CREATININE 0.63 08/11/2013   CALCIUM 9.2 06/24/2014   CALCIUM 9.2 06/21/2014   CALCIUM 8.5 08/11/2013   LFT  Recent Labs  06/24/14 1318  PROT 5.9*  ALBUMIN 3.5  AST 17  ALT 10  ALKPHOS 40  BILITOT 0.9   PT/INR Lab Results  Component Value Date   INR 2.10* 06/24/2014   INR 1.11 08/07/2013   INR 1.49 07/28/2013    RADIOLOGY STUDIES: No results found.  ENDOSCOPIC STUDIES: See HPI.   IMPRESSION:   *  Blood in stool, Anemia, in pt on Xarelto.  Outpt colonoscopy was set for today. After getting history, am not convinced this is LGIB.  Added enteroscopy and capsule endo to permit slip in case these are needed.  She had workup of anemia and FOBT + in 08/2011, and though was not grossly bleeding then, the studies were unrevealing.  I suspect she may have SB AVMs which are the culprit. Also takes Fosamax once a week which can cause esophageal injury, but her sxs are negative for suggestion of esophagitis.  Very low likelihood that this is ulcer or reflux related disease given negative previous EGD findings and daily Nexium. Does have history of resected GIST  but low likelihood that she has a recurrence.   *  Blood loss anemia.  S/p 1 PRBC transfusion.   *  Chronic Xarelto for A fib.  Has been on holdfor 2 days with ok from cardiologist, Dr Acie Fredrickson.   *  S/p 02/2007 proximal stomach GIST removal.  No evidence recurrence on 2009 or 2013 EGDs.  *  Mild hyponatremia and hypokalemia.    PLAN:     *  Colonoscopy, and if no source for bleeding, then enteroscopy and possible capsule endo.  Timing set for 4/30.  Complete split dose movi  prep.  Clears today.  Oral PPI.  Patient and husband in agreement and eager to proceed.   Azucena Freed  06/25/2014, 8:10 AM Pager: [phone]

## 2014-06-26 NOTE — Progress Notes (Signed)
UR completed 

## 2014-06-26 NOTE — Op Note (Signed)
[facility] [place], [id number]   ENTEROSCOPY PROCEDURE REPORT     EXAM DATE: Jun 21, 202016  PATIENT NAME:      Sabrina, [name]           MR #:      [id number]  BIRTHDATE:       Sep 13, 1934      VISIT #:     [phone]  ATTENDING:     Inda Castle, MD     STATUS:     outpatient ASSISTANT:      Laverta Baltimore and Shoffner, Trevor Mace MD: Crist Infante, M.D. ASA CLASS:        Class III  INDICATIONS:  The patient is a 80 yr old female here for an enteroscopy procedure due to red rectal bleeding. PROCEDURE PERFORMED:     Diagnostic small bowel enteroscopy  MEDICATIONS:     Residual sedation present and 12.5  mcgIV  CONSENT: The patient understands the risks and benefits of the procedure and understands that these risks include, but are not limited to: sedation, allergic reaction, infection, perforation and/or bleeding. Alternative means of evaluation and treatment include, among others: physical exam, x-rays, and/or surgical intervention. The patient elects to proceed with this endoscopic procedure.  DESCRIPTION OF PROCEDURE: During intra-op preparation period all mechanical & medical equipment was checked for proper function. Hand hygiene and appropriate measures for infection prevention was taken. After the risks, benefits and alternatives of the procedure were thoroughly explained, Informed consent was verified, confirmed and timeout was successfully executed by the treatment team. The    endoscope was introduced through the mouth and advanced to the proximal jejunum jejunum. The prep was   . The instrument was then slowly withdrawn while examining the mucosa circumferentially. The scope was then completely withdrawn from the patient and the procedure terminated. The pulse, BP, and O2 saturation were monitored and documented by the physician and the nursing staff throughout the entire procedure.  The patient was cared  for as planned according to standard protocol, then discharged to recovery in stable condition and with appropriate post procedure care. Estimated blood loss is zero unless otherwise noted in this procedure report.    ESOPHAGUS: The esophagus and gastroesophageal junction were completely normal in appearance.  DUODENUM: The duodenal bulb was normal in appearance, as was the postbulbar duodenum.  STOMACH: The stomach was entered and closely examined.  the antrum, angularis, and lesser curvature were well visualized, including a retroflexed view of the cardia and fundus.  The stomach wall was normally distensable.  The scope passed easily through the pylorus into the duodenum.   The proximal jejunum was unremarkable.    ADVERSE EVENTS:      There were no immediate complications.  IMPRESSIONS:     1.  Normal appearing esophagus and GE junction 2.  Normal appearing duodenum 3.  The stomach was well visualized and normal in appearance 4.  normal proximal jejunum  RECOMMENDATIONS:     capsule endoscopy as an outpatient RECALL:  _____________________________ Inda Castle, MD eSigned:  Inda Castle, MD Jun 21, 202016 3:41 PM   cc:     PATIENT NAME:  Sabrina, [name] MR#: [id number]

## 2014-06-26 NOTE — Discharge Summary (Signed)
Physician Discharge Summary  [name] NFA:[id number] DOB: [birth year] DOA: 06/24/2014  PCP: Jerlyn Ly, MD  Admit date: 06/24/2014 Discharge date: Feb 01, 202016  Time spent: 20 minutes  Recommendations for Outpatient Follow-up:  1. Follow up with PCP in 1-2 weeks 2. Follow up with GI for outpatient capsule endoscopy as scheduled 3. Please monitor blood pressures closely. HCTZ and valsartan have been stopped on discharge secondary to borderline low blood pressures  Discharge Diagnoses:  Principal Problem:   Acute blood loss anemia Active Problems:   GI bleed   Hypertension   Chronic diastolic heart failure   Chronic atrial fibrillation   Sleep apnea   GIB (gastrointestinal bleeding)   Internal and external hemorrhoids without complication   Discharge Condition: Stable  Diet recommendation: Regular  Filed Weights   06/25/14 0535 06/26/14 0456  Weight: 70.2 kg (154 lb 12.2 oz) 66.996 kg (147 lb 11.2 oz)    History of present illness:  Please see admit h and p from 4/28 for details. Briefly, pt presented with acute blood loss anemia. Patient was admitted for further work up  Hospital Course:  1. Acute blood loss anemia 1. GI was consulted and patient underwent EGD that was normal and colonoscopy that was notable for hemorrhoids on 4/30 2. Xarelto was initially on hold, but now safe to resume per GI post-endoscopy 3. Pt was s/p 2 units PRBC's 2. Chronic diastolic chf 1. Clinically euvolemic 2. Continue to monitor 3. Chronic afib 1. Pt initially noted to be in RVR 2. Meds were held on admit secondary to questionable borderline low BP 3. BP remained stable and metoprolol was resumed with PRN IV labetalol for added rate control 4. HR remained in the low 100's and metoprolol was increased to 50mg  daily 4. OSA 1. On CPAP 5. HTN 1. BP stable 2. Valsartan and HCTZ are on hold 3. Metoprolol per above 4. Would have bp management continued as outpatient 6. DVT  prophylaxis 1. SCD's while inpatient  Procedures:  EGD and colonoscopy 4/30  Consultations:  GI  Discharge Exam: Filed Vitals:   06/26/14 1540 06/26/14 1545 06/26/14 1550 06/26/14 1612  BP: 122/47 123/44 127/61 116/77  Pulse: 88 95 102 93  Temp:    97.4 F (36.3 C)  TempSrc:      Resp: 22 20 23    Weight:      SpO2: 99% 97% 96% 95%    General: awake, in nad Cardiovascular: regular, s1, s2 Respiratory: normal resp effort, no wheezing  Discharge Instructions     Medication List    STOP taking these medications        hydrochlorothiazide 12.5 MG capsule  Commonly known as:  MICROZIDE     valsartan 160 MG tablet  Commonly known as:  DIOVAN      TAKE these medications        alendronate 70 MG tablet  Commonly known as:  FOSAMAX  Take 70 mg by mouth once a week. Take with a full glass of water on an empty stomach.* every wednesday     amLODipine 5 MG tablet  Commonly known as:  NORVASC  TAKE 1 TABLET EVERY DAY     atorvastatin 20 MG tablet  Commonly known as:  LIPITOR  Take 20 mg by mouth daily.     CALCIUM-VITAMIN D PO  Take 1 tablet by mouth daily.     CHOICE DM FIBER-BURST PO  Take by mouth daily.     Cholecalciferol 10000 UNITS Caps  Take 1,000  capsules by mouth daily.     CORICIDIN D PO  Take 1 capsule by mouth daily as needed (sinua).     doxazosin 4 MG tablet  Commonly known as:  CARDURA  TAKE 1 TABLET BY MOUTH EVERY DAY AT BEDTIME     esomeprazole 40 MG capsule  Commonly known as:  NEXIUM  Take 40 mg by mouth 4 (four) times a week.     ezetimibe 10 MG tablet  Commonly known as:  ZETIA  Take 5 mg by mouth daily.     ICAPS AREDS FORMULA PO  Take 1 capsule by mouth daily.     KLOR-CON M10 10 MEQ tablet  Generic drug:  potassium chloride  Take 5 mEq by mouth daily.     methocarbamol 750 MG tablet  Commonly known as:  ROBAXIN  Take 750 mg by mouth every 8 (eight) hours as needed for muscle spasms.     metoprolol succinate 50 MG  24 hr tablet  Commonly known as:  TOPROL-XL  Take 1 tablet (50 mg total) by mouth daily. Take with or immediately following a meal.  Start taking on:  06/27/2014     multivitamin tablet  Take 1 tablet by mouth daily.     nitroGLYCERIN 0.4 MG SL tablet  Commonly known as:  NITROSTAT  Place 1 tablet (0.4 mg total) under the tongue every 5 (five) minutes as needed for chest pain.     OMEGA-3 FATTY ACIDS PO  Take 1 capsule by mouth daily.     oxyCODONE-acetaminophen 5-325 MG per tablet  Commonly known as:  PERCOCET/ROXICET  Take 1-2 tablets by mouth every 4 (four) hours as needed for moderate pain or severe pain.     rivaroxaban 20 MG Tabs tablet  Commonly known as:  XARELTO  Take 1 tablet (20 mg total) by mouth daily.     zolpidem 6.25 MG CR tablet  Commonly known as:  AMBIEN CR  Take 6.25 mg by mouth at bedtime as needed for sleep.       Allergies  Allergen Reactions  . Pradaxa [Dabigatran Etexilate Mesylate] Nausea Only  . Tranexamic Acid     Patient ineligible for Tranexamic acid due to hx of NSTEMI and CAD.   Follow-up Information    Follow up with PERINI,MARK A, MD. Schedule an appointment as soon as possible for a visit in 1 week.   Specialty:  Internal Medicine   Why:  Hospital follow up   Contact information:   [address] [place] [address] [phone]       Schedule an appointment as soon as possible for a visit with [name], MD.   Specialty:  Gastroenterology   Contact information:   [place] [place] [address] [phone]        The results of significant diagnostics from this hospitalization (including imaging, microbiology, ancillary and laboratory) are listed below for reference.    Significant Diagnostic Studies: No results found.  Microbiology: No results found for this or any previous visit (from the past 240 hour(s)).   Labs: Basic Metabolic Panel:  Recent Labs Lab 06/21/14 1614 06/24/14 1318 06/25/14 0803  06/25/14 1220 06/26/14 1309  NA 134* 135 138  --  133*  K 3.7 3.4* 3.1*  --  3.7  CL 101 104 106  --  105  CO2 30 24 24   --  18*  GLUCOSE 92 98 102*  --  94  BUN 23 17 8   --  7  CREATININE 0.80 0.76 0.75  --  0.71  CALCIUM 9.2 9.2 8.9  --  8.9  MG  --   --   --  1.7  --    Liver Function Tests:  Recent Labs Lab 06/24/14 1318  AST 17  ALT 10  ALKPHOS 40  BILITOT 0.9  PROT 5.9*  ALBUMIN 3.5   No results for input(s): LIPASE, AMYLASE in the last 168 hours. No results for input(s): AMMONIA in the last 168 hours. CBC:  Recent Labs Lab 06/21/14 1614 06/24/14 1318 06/24/14 2213 06/25/14 0803 06/26/14 0533  WBC 4.4 3.1* 4.3 3.9*  --   NEUTROABS  --  1.6*  --   --   --   HGB 8.0 Repeated and verified X2.* 7.1* 8.3* 8.6* 9.7*  HCT 24.0* 21.8* 25.1* 26.1* 30.0*  MCV 84.7 85.5 83.7 83.7  --   PLT 211.0 187 206 211  --    Cardiac Enzymes: No results for input(s): CKTOTAL, CKMB, CKMBINDEX, TROPONINI in the last 168 hours. BNP: BNP (last 3 results)  Recent Labs  06/24/14 2127  BNP 401.1*    ProBNP (last 3 results) No results for input(s): PROBNP in the last 8760 hours.  CBG: No results for input(s): GLUCAP in the last 168 hours.  Signed:  CHIU, Orpah Melter  [facility] 2020/02/1714, 4:22 PM

## 2014-06-26 NOTE — Progress Notes (Signed)
Patient discharge teaching given, including activity, diet, follow-up appoints, and medications. Patient verbalized understanding of all discharge instructions. IV access was d/c'd. Vitals are stable. Skin is intact except as charted in most recent assessments. Pt to be escorted out by NT, to be driven home by family. 

## 2014-06-28 ENCOUNTER — Encounter (HOSPITAL_COMMUNITY): Payer: Self-pay | Admitting: Gastroenterology

## 2014-06-28 ENCOUNTER — Telehealth: Payer: Self-pay

## 2014-06-28 NOTE — Telephone Encounter (Signed)
Spoke with the patient. She was unaware of the capsule endoscopy but once it was explained to her, she is interested. She asks to call back to schedule. She would like to see her PCP first and possibly her cardiologist.

## 2014-06-28 NOTE — Telephone Encounter (Signed)
-----   Message from Inda Castle, MD sent at December 15, 202016  4:29 PM EDT ----- Patient of Dr. Henrene Pastor.  Please schedule capsule endoscopy

## 2014-06-29 ENCOUNTER — Other Ambulatory Visit: Payer: Self-pay

## 2014-06-29 ENCOUNTER — Telehealth: Payer: Self-pay

## 2014-06-29 DIAGNOSIS — K2961 Other gastritis with bleeding: Secondary | ICD-10-CM

## 2014-06-29 DIAGNOSIS — D509 Iron deficiency anemia, unspecified: Secondary | ICD-10-CM

## 2014-06-29 NOTE — Telephone Encounter (Signed)
-----   Message from Irene Shipper, MD sent at 06/28/2014  3:55 PM EDT ----- Faythe Ghee to schedule. The reason is GI bleeding rule out small bowel lesion ----- Message -----    From: Greggory Keen, LPN    Sent: 0/10/6436   3:35 PM      To: Irene Shipper, MD  Is it okay to schedule her? Does she need an appointment first?  ----- Message -----    From: Inda Castle, MD    Sent: 07/24/202016   4:29 PM      To: Greggory Keen, LPN  Patient of Dr. Henrene Pastor.  Please schedule capsule endoscopy

## 2014-06-29 NOTE — Telephone Encounter (Signed)
Patient calls and is ready to schedule her study. Appointments made for 07/07/14 teaching and 07/14/14 test. Patient agrees to these appointments.

## 2014-07-01 ENCOUNTER — Other Ambulatory Visit: Payer: Self-pay

## 2014-07-07 ENCOUNTER — Encounter: Payer: Self-pay | Admitting: Cardiology

## 2014-07-11 ENCOUNTER — Other Ambulatory Visit: Payer: Self-pay | Admitting: Internal Medicine

## 2014-07-14 ENCOUNTER — Ambulatory Visit (INDEPENDENT_AMBULATORY_CARE_PROVIDER_SITE_OTHER): Payer: Medicare Other | Admitting: Internal Medicine

## 2014-07-14 DIAGNOSIS — K921 Melena: Secondary | ICD-10-CM

## 2014-07-14 DIAGNOSIS — D62 Acute posthemorrhagic anemia: Secondary | ICD-10-CM | POA: Diagnosis not present

## 2014-07-14 DIAGNOSIS — R195 Other fecal abnormalities: Secondary | ICD-10-CM

## 2014-07-14 NOTE — Progress Notes (Signed)
Patient here for capsule endoscopy. Patient completed prep and has not eaten. Tolerated procedure. Verbalizes understanding of written and verbal instructions. Lot [id number] 10 Expires 2017-06

## 2014-07-22 ENCOUNTER — Telehealth: Payer: Self-pay | Admitting: *Deleted

## 2014-07-22 NOTE — Telephone Encounter (Signed)
Patient states she has passed her capsule.

## 2014-07-29 ENCOUNTER — Telehealth: Payer: Self-pay

## 2014-07-29 NOTE — Telephone Encounter (Signed)
-----   Message from Irene Shipper, MD sent at 07/28/2014  4:31 PM EDT ----- Regarding: FW: capsule Vaughan Basta, Please let the patient know that her capsule endoscopy was unremarkable. No further GI workup planned. Hopefully her blood counts will improve on iron. Contact the office for any further significant bleeding. Please convert to phone note for record. Thank you. Dr. Henrene Pastor ----- Message -----    From: Alfredia Ferguson, PA-C    Sent: 07/23/2014  12:04 PM      To: Irene Shipper, MD Subject: capsule                                        JP.. Capsule ready for  Review on this pt

## 2014-07-29 NOTE — Telephone Encounter (Signed)
Left message for pt to call back  °

## 2014-07-29 NOTE — Telephone Encounter (Signed)
Spoke with pt and she is aware.

## 2014-07-30 ENCOUNTER — Encounter: Payer: Self-pay | Admitting: Gastroenterology

## 2014-08-24 ENCOUNTER — Ambulatory Visit: Payer: Medicare Other | Admitting: Adult Health

## 2014-08-26 ENCOUNTER — Ambulatory Visit (INDEPENDENT_AMBULATORY_CARE_PROVIDER_SITE_OTHER): Payer: Medicare Other | Admitting: Adult Health

## 2014-08-26 ENCOUNTER — Encounter: Payer: Self-pay | Admitting: Adult Health

## 2014-08-26 VITALS — BP 129/84 | HR 84 | Ht 63.0 in | Wt 160.0 lb

## 2014-08-26 DIAGNOSIS — R413 Other amnesia: Secondary | ICD-10-CM | POA: Diagnosis not present

## 2014-08-26 DIAGNOSIS — G4733 Obstructive sleep apnea (adult) (pediatric): Secondary | ICD-10-CM

## 2014-08-26 DIAGNOSIS — Z9989 Dependence on other enabling machines and devices: Principal | ICD-10-CM

## 2014-08-26 NOTE — Patient Instructions (Signed)
Continue CPAP Mask needs to be refitted Will continue to monitor memory.

## 2014-08-26 NOTE — Progress Notes (Addendum)
[name] DOB: [birth year]  REASON FOR VISIT: follow up- OSA on CPAP, memory HISTORY FROM: patient  HISTORY OF PRESENT ILLNESS: [name] is a 80 year old female with a history of obstructive sleep apnea on CPAP. She returns today for a 30 day compliance download. Patient uses her machine 28 out of 30 days for compliance of 93%. She used her machine for greater than 4 hours- 28 out of 30 days for compliance of 93%. She uses her machine on average 6 hours and 44 minutes. Her AHI of 0.8 at 9 cm of water with EPR of 2. Her leak in the 95th percentile is 24.0 L/min. patient states that she does feel that her mask or straps may need to be adjusted. She feels that the CPAP has been beneficial for her sleep. She feels that her memory has remained stable. She continues to have trouble with recall. She states that now when she gets in the car to drive she has to think about how to get to familiar places whereas before she did not have to do this. She is able to complete all ADLs independently. She denies having to give up anything due to her memory. She returns today for an evaluation.  HISTORY 02/10/14: [name] is a 80 year old female with history of obstructive sleep Apnea. She returns today for a 90 day compliance download. She brought her machine with her today and the reports shows an AHI of 0.6 at 9 cm of water with EPR 2, uses her machine for 6 hours 49 minutes a night, with 87% compliance. Patient reports that she gets about 6-7 hours of sleep a night. She goes to bed around 10:30-11:30 PM and arises at 7:30. She denies having trouble falling asleep or staying a sleep. States that the she gets up about 3 times a night to urinate. Since the last visit the patient has had no new medical issues. The patient states that she has been having some memory issues. She notices that she can't remember street names and has been living in [place] for years. She can read a book but not be  able to tell someone else about it. She has trouble with people's names as well. She does still operate a motor vehicle but has not gotten lost. She is able to complete all ADLs without difficulty. She cooks at home without difficulty.  REVIEW OF SYSTEMS: Out of a complete 14 system review of symptoms, the patient complains only of the following symptoms, and all other reviewed systems are negative.  Eye itching, ear discharge, shortness of breath, apnea, back pain, joint pain, incontinence of bladder, frequency of urination  ALLERGIES: Allergies  Allergen Reactions  . Pradaxa [Dabigatran Etexilate Mesylate] Nausea Only  . Tranexamic Acid     Patient ineligible for Tranexamic acid due to hx of NSTEMI and CAD.    HOME MEDICATIONS: Outpatient Prescriptions Prior to Visit  Medication Sig Dispense Refill  . alendronate (FOSAMAX) 70 MG tablet Take 70 mg by mouth once a week. Take with a full glass of water on an empty stomach.* every wednesday    . amLODipine (NORVASC) 5 MG tablet TAKE 1 TABLET EVERY DAY 30 tablet 11  . atorvastatin (LIPITOR) 20 MG tablet Take 20 mg by mouth daily.     [name] CALCIUM-VITAMIN D PO Take 1 tablet by mouth daily.    . Chlorphen-Pseudoephed-APAP (CORICIDIN D PO) Take 1 capsule by mouth daily as needed (sinua).    [name]  Cholecalciferol 10000 UNITS CAPS Take 1,000 capsules by mouth daily.    [name] doxazosin (CARDURA) 4 MG tablet TAKE 1 TABLET BY MOUTH EVERY DAY AT BEDTIME 30 tablet 5  . esomeprazole (NEXIUM) 40 MG capsule Take 40 mg by mouth 4 (four) times a week.    . ezetimibe (ZETIA) 10 MG tablet Take 5 mg by mouth daily.     [name] KLOR-CON M10 10 MEQ tablet Take 5 mEq by mouth daily.     . methocarbamol (ROBAXIN) 750 MG tablet Take 750 mg by mouth every 8 (eight) hours as needed for muscle spasms.    . metoprolol succinate (TOPROL-XL) 50 MG 24 hr tablet Take 1 tablet (50 mg total) by mouth daily. Take with or immediately following a meal. 30 tablet 0  . Multiple Vitamin  (MULTIVITAMIN) tablet Take 1 tablet by mouth daily.      . Multiple Vitamins-Minerals (ICAPS AREDS FORMULA PO) Take 1 capsule by mouth daily.    . nitroGLYCERIN (NITROSTAT) 0.4 MG SL tablet Place 1 tablet (0.4 mg total) under the tongue every 5 (five) minutes as needed for chest pain. 25 tablet prn  . Nutritional Supplements (CHOICE DM FIBER-BURST PO) Take by mouth daily.     . OMEGA-3 FATTY ACIDS PO Take 1 capsule by mouth daily.    [name] oxyCODONE-acetaminophen (PERCOCET/ROXICET) 5-325 MG per tablet Take 1-2 tablets by mouth every 4 (four) hours as needed for moderate pain or severe pain.    . rivaroxaban (XARELTO) 20 MG TABS tablet Take 1 tablet (20 mg total) by mouth daily. 30 tablet 6  . zolpidem (AMBIEN CR) 6.25 MG CR tablet Take 6.25 mg by mouth at bedtime as needed for sleep.     No facility-administered medications prior to visit.    PAST MEDICAL HISTORY: Past Medical History  Diagnosis Date  . MI, acute, non ST segment elevation Dec.7,2011    cath  . Coronary artery disease     minimal  . Hypertension   . Hyperlipidemia   . Hypokalemia   . Incontinence of urine   . Stromal tumor of the stomach     cancer  . Atrial fibrillation   . Arthritis   . GERD (gastroesophageal reflux disease)   . Hiatal hernia   . Osteopenia   . Bladder polyps   . IBS (irritable bowel syndrome)   . Cervical polyp   . Cataracts, bilateral   . IFG (impaired fasting glucose)   . OSA (obstructive sleep apnea)     hypoxia , not CO 2 retainer,   . OSA on CPAP 06/16/2013     Sleep study 1- 11-15 with an AHI of 61 , now titrated to CPAP 9 cm water with 2 cm EPR.   . Cancer     stomach  . Dysrhythmia      AF  hx     dr Acie Fredrickson  . Chronic kidney disease     urinary incont-    PAST SURGICAL HISTORY: Past Surgical History  Procedure Laterality Date  . Stromal stomach  tumor  03/26/2007    resection  . Ankle fracture surgery  2006    right, plate   . Cardiac catheterization  12/07/20210     Dr.Nahser,minor cad  . Eye surgery      both cataracts  . Colonoscopy    . Upper gi endoscopy    . Knee arthroscopy Right 05/28/2012    Procedure: RIGHT KNEE ARTHROSCOPY PARTIAL MEDIAL AND LATERAL MENISECTOMY WITH CHONDROPLASTY;  Surgeon: Alta Corning, MD;  Location: [facility];  Service: Orthopedics;  Laterality: Right;  . Total knee arthroplasty Right 08/10/2013    Procedure: TOTAL KNEE ARTHROPLASTY;  Surgeon: Alta Corning, MD;  Location: [facility];  Service: Orthopedics;  Laterality: Right;  . Colonoscopy N/A September 07, 202016    Procedure: COLONOSCOPY;  Surgeon: Inda Castle, MD;  Location: [facility];  Service: Endoscopy;  Laterality: N/A;  . Enteroscopy N/A September 07, 202016    Procedure: ENTEROSCOPY;  Surgeon: Inda Castle, MD;  Location: [facility];  Service: Endoscopy;  Laterality: N/A;    FAMILY HISTORY: Family History  Problem Relation Age of Onset  . Pancreatic cancer Paternal Grandmother   . Diabetes Paternal Aunt   . Heart disease Paternal Grandfather     both sides of the family    SOCIAL HISTORY: History   Social History  . Marital Status: Married    Spouse Name: Joneen Boers  . Number of Children: 2  . Years of Education: Masters   Occupational History  . retired    Social History Main Topics  . Smoking status: Never Smoker   . Smokeless tobacco: Never Used  . Alcohol Use: 1.2 oz/week    2 Glasses of wine per week     Comment: one per week  . Drug Use: No  . Sexual Activity: No   Other Topics Concern  . Not on file   Social History Narrative   Patient is married Joneen Boers) and lives at home with her husband.   Patient is retired.   Patient has a Oceanographer in Education   Patient is right-handed.   Patient does not drink any caffeine.   Patient has one living child and one child is deceased.      PHYSICAL EXAM   Generalized: Well developed, in no acute distress   Neurological examination  Mentation: Alert oriented to time, place, history  taking. Follows all commands speech and language fluent. MMSE 27/30. Cranial nerve II-XII: Pupils were equal round reactive to light. Extraocular movements were full, visual field were full on confrontational test. Facial sensation and strength were normal. Uvula tongue midline. Head turning and shoulder shrug  were normal and symmetric. Motor: The motor testing reveals 5 over 5 strength of all 4 extremities. Good symmetric motor tone is noted throughout.  Sensory: Sensory testing is intact to soft touch on all 4 extremities. No evidence of extinction is noted.  Coordination: Cerebellar testing reveals good finger-nose-finger and heel-to-shin bilaterally.  Gait and station: Gait is normal.  Reflexes: Deep tendon reflexes are symmetric and normal bilaterally.    DIAGNOSTIC DATA (LABS, IMAGING, TESTING) - I reviewed patient records, labs, notes, testing and imaging myself where available.    ASSESSMENT AND PLAN 80 y.o. year old female  has a past medical history of MI, acute, non ST segment elevation (Dec.7,2011); Coronary artery disease; Hypertension; Hyperlipidemia; Hypokalemia; Incontinence of urine; Stromal tumor of the stomach; Atrial fibrillation; Arthritis; GERD (gastroesophageal reflux disease); Hiatal hernia; Osteopenia; Bladder polyps; IBS (irritable bowel syndrome); Cervical polyp; Cataracts, bilateral; IFG (impaired fasting glucose); OSA (obstructive sleep apnea); OSA on CPAP (06/16/2013); Cancer; Dysrhythmia; and Chronic kidney disease. here with:  1. Obstructive sleep apnea on CPAP 2. Memory deficit  Overall the patient has remained stable. Her CPAP download shows excellent compliance. She does have a leak therefore we'll have the patient's mask refitted. Patient's memory has remained stable. Her MMSE is 27/30 was previously 27/30. We will continue to monitor this. Patient advised that  if her symptoms worsen or she develops new symptoms she should let us know. Otherwise she will  follow-up in 6 months or sooner if needed.     Ward Givens, MSN, NP-C 08/26/2014, 11:26 AM [facility] [address] [place], [place] [address] [phone]  Note: This document was prepared with digital dictation and possible smart phrase technology. Any transcriptional errors that result from this process are unintentional.  I reviewed the above note and documentation by the Nurse Practitioner and agree with the history, physical exam, assessment and plan as outlined above. I was immediately available for face-to-face consultation. Star Age, MD, PhD [facility])

## 2014-09-02 ENCOUNTER — Encounter: Payer: Self-pay | Admitting: Cardiovascular Disease

## 2014-09-17 ENCOUNTER — Other Ambulatory Visit: Payer: Self-pay | Admitting: *Deleted

## 2015-01-19 ENCOUNTER — Other Ambulatory Visit (HOSPITAL_COMMUNITY): Payer: Self-pay | Admitting: Orthopedic Surgery

## 2015-01-19 DIAGNOSIS — M25571 Pain in right ankle and joints of right foot: Secondary | ICD-10-CM

## 2015-01-19 DIAGNOSIS — M25561 Pain in right knee: Secondary | ICD-10-CM

## 2015-01-24 ENCOUNTER — Encounter (HOSPITAL_COMMUNITY): Payer: Medicare Other

## 2015-01-25 ENCOUNTER — Other Ambulatory Visit: Payer: Self-pay | Admitting: Cardiovascular Disease

## 2015-02-03 ENCOUNTER — Other Ambulatory Visit: Payer: Self-pay | Admitting: Cardiovascular Disease

## 2015-02-04 ENCOUNTER — Encounter (HOSPITAL_COMMUNITY)
Admission: RE | Admit: 2015-02-04 | Discharge: 2015-02-04 | Disposition: A | Discharge status: Home / Self Care | Payer: Medicare Other | Source: Ambulatory Visit | Attending: Orthopedic Surgery | Admitting: Orthopedic Surgery

## 2015-02-04 ENCOUNTER — Ambulatory Visit: Payer: Medicare Other | Admitting: Cardiovascular Disease

## 2015-02-04 DIAGNOSIS — M25571 Pain in right ankle and joints of right foot: Secondary | ICD-10-CM | POA: Diagnosis not present

## 2015-02-04 DIAGNOSIS — M25561 Pain in right knee: Secondary | ICD-10-CM

## 2015-02-04 MED ORDER — TECHNETIUM TC 99M MEDRONATE IV KIT
26.3000 | PACK | Freq: Once | INTRAVENOUS | Status: AC | PRN
  Administered 2015-02-04: 26.3 via INTRAVENOUS

## 2015-02-09 ENCOUNTER — Encounter: Payer: Self-pay | Admitting: Nurse Practitioner

## 2015-02-09 ENCOUNTER — Ambulatory Visit (INDEPENDENT_AMBULATORY_CARE_PROVIDER_SITE_OTHER): Payer: Medicare Other | Admitting: Nurse Practitioner

## 2015-02-09 VITALS — BP 160/100 | HR 84 | Ht 64.5 in | Wt 163.4 lb

## 2015-02-09 DIAGNOSIS — I10 Essential (primary) hypertension: Secondary | ICD-10-CM

## 2015-02-09 DIAGNOSIS — E785 Hyperlipidemia, unspecified: Secondary | ICD-10-CM

## 2015-02-09 DIAGNOSIS — I259 Chronic ischemic heart disease, unspecified: Secondary | ICD-10-CM | POA: Diagnosis not present

## 2015-02-09 DIAGNOSIS — I2510 Atherosclerotic heart disease of native coronary artery without angina pectoris: Secondary | ICD-10-CM | POA: Diagnosis not present

## 2015-02-09 LAB — BASIC METABOLIC PANEL
BUN: 17 mg/dL (ref 7–25)
CO2: 31 mmol/L (ref 20–31)
Calcium: 10 mg/dL (ref 8.6–10.4)
Chloride: 98 mmol/L (ref 98–110)
Creat: 0.91 mg/dL (ref 0.60–0.93)
Glucose, Bld: 86 mg/dL (ref 65–99)
Potassium: 3.7 mmol/L (ref 3.5–5.3)
Sodium: 136 mmol/L (ref 135–146)

## 2015-02-09 LAB — CBC
HCT: 40.4 % (ref 36.0–46.0)
Hemoglobin: 13.9 g/dL (ref 12.0–15.0)
MCH: 30.1 pg (ref 26.0–34.0)
MCHC: 34.4 g/dL (ref 30.0–36.0)
MCV: 87.4 fL (ref 78.0–100.0)
MPV: 10.9 fL (ref 8.6–12.4)
Platelets: 203 10*3/uL (ref 150–400)
RBC: 4.62 MIL/uL (ref 3.87–5.11)
RDW: 13.6 % (ref 11.5–15.5)
WBC: 5.8 10*3/uL (ref 4.0–10.5)

## 2015-02-09 MED ORDER — METOPROLOL SUCCINATE ER 100 MG PO TB24: 100.0000 mg | ORAL_TABLET | Freq: Every day | ORAL | Status: DC

## 2015-02-09 MED ORDER — DOXAZOSIN MESYLATE 4 MG PO TABS: 4.0000 mg | ORAL_TABLET | Freq: Every day | ORAL | Status: DC

## 2015-02-09 NOTE — Patient Instructions (Addendum)
We will be checking the following labs today - BMET, CBC   Medication Instructions:    Continue with your current medicines. BUT  I am increasing the Toprol to 100 mg a day - you may take 2 of your 50 mg tablets and use those up - I sent the RX for the 100 mg to your drug store.   I refilled the Cardura for you today.     Testing/Procedures To Be Arranged:  N/A  Follow-Up:   See me in one month with your BP diary.    Other Special Instructions:   Try to restrict your salt more Low-Sodium Eating Plan Sodium raises blood pressure and causes water to be held in the body. Getting less sodium from food will help lower your blood pressure, reduce any swelling, and protect your heart, liver, and kidneys. We get sodium by adding salt (sodium chloride) to food. Most of our sodium comes from canned, boxed, and frozen foods. Restaurant foods, fast foods, and pizza are also very high in sodium. Even if you take medicine to lower your blood pressure or to reduce fluid in your body, getting less sodium from your food is important. WHAT IS MY PLAN? Most people should limit their sodium intake to 2,300 mg a day. Your health care provider recommends that you limit your sodium intake to __________ a day.  WHAT DO I NEED TO KNOW ABOUT THIS EATING PLAN? For the low-sodium eating plan, you will follow these general guidelines: Choose foods with a % Daily Value for sodium of less than 5% (as listed on the food label).  Use salt-free seasonings or herbs instead of table salt or sea salt.  Check with your health care provider or pharmacist before using salt substitutes.  Eat fresh foods. Eat more vegetables and fruits. Limit canned vegetables. If you do use them, rinse them well to decrease the sodium.  Limit cheese to 1 oz (28 g) per day.  Eat lower-sodium products, often labeled as "lower sodium" or "no salt added." Avoid foods that contain monosodium glutamate (MSG). MSG is sometimes added  to [place] food and some canned foods. Check food labels (Nutrition Facts labels) on foods to learn how much sodium is in one serving. Eat more home-cooked food and less restaurant, buffet, and fast food. When eating at a restaurant, ask that your food be prepared with less salt, or no salt if possible.  HOW DO I READ FOOD LABELS FOR SODIUM INFORMATION? The Nutrition Facts label lists the amount of sodium in one serving of the food. If you eat more than one serving, you must multiply the listed amount of sodium by the number of servings. Food labels may also identify foods as: Sodium free--Less than 5 mg in a serving. Very low sodium--35 mg or less in a serving. Low sodium--140 mg or less in a serving. Light in sodium--50% less sodium in a serving. For example, if a food that usually has 300 mg of sodium is changed to become light in sodium, it will have 150 mg of sodium. Reduced sodium--25% less sodium in a serving. For example, if a food that usually has 400 mg of sodium is changed to reduced sodium, it will have 300 mg of sodium. WHAT FOODS CAN I EAT? Grains Low-sodium cereals, including oats, puffed wheat and rice, and shredded wheat cereals. Low-sodium crackers. Unsalted rice and pasta. Lower-sodium bread.  Vegetables Frozen or fresh vegetables. Low-sodium or reduced-sodium canned vegetables. Low-sodium or reduced-sodium tomato sauce and paste.  Low-sodium or reduced-sodium tomato and vegetable juices.  Fruits Fresh, frozen, and canned fruit. Fruit juice.  Meat and Other Protein Products Low-sodium canned tuna and salmon. Fresh or frozen meat, poultry, seafood, and fish. Lamb. Unsalted nuts. Dried beans, peas, and lentils without added salt. Unsalted canned beans. Homemade soups without salt. Eggs.  Dairy Milk. Soy milk. Ricotta cheese. Low-sodium or reduced-sodium cheeses. Yogurt.  Condiments Fresh and dried herbs and spices. Salt-free seasonings. Onion and garlic powders.  Low-sodium varieties of mustard and ketchup. Fresh or refrigerated horseradish. Lemon juice.  Fats and Oils Reduced-sodium salad dressings. Unsalted butter.  Other Unsalted popcorn and pretzels.  The items listed above may not be a complete list of recommended foods or beverages. Contact your dietitian for more options. WHAT FOODS ARE NOT RECOMMENDED? Grains Instant hot cereals. Bread stuffing, pancake, and biscuit mixes. Croutons. Seasoned rice or pasta mixes. Noodle soup cups. Boxed or frozen macaroni and cheese. Self-rising flour. Regular salted crackers. Vegetables Regular canned vegetables. Regular canned tomato sauce and paste. Regular tomato and vegetable juices. Frozen vegetables in sauces. Salted [place] fries. Olives. Angie Fava. Relishes. Sauerkraut. Salsa. Meat and Other Protein Products Salted, canned, smoked, spiced, or pickled meats, seafood, or fish. Bacon, ham, sausage, hot dogs, corned beef, chipped beef, and packaged luncheon meats. Salt pork. Jerky. Pickled herring. Anchovies, regular canned tuna, and sardines. Salted nuts. Dairy Processed cheese and cheese spreads. Cheese curds. Blue cheese and cottage cheese. Buttermilk.  Condiments Onion and garlic salt, seasoned salt, table salt, and sea salt. Canned and packaged gravies. Worcestershire sauce. Tartar sauce. Barbecue sauce. Teriyaki sauce. Soy sauce, including reduced sodium. Steak sauce. Fish sauce. Oyster sauce. Cocktail sauce. Horseradish that you find on the shelf. Regular ketchup and mustard. Meat flavorings and tenderizers. Bouillon cubes. Hot sauce. Tabasco sauce. Marinades. Taco seasonings. Relishes. Fats and Oils Regular salad dressings. Salted butter. Margarine. Ghee. Bacon fat.  Other Potato and tortilla chips. Corn chips and puffs. Salted popcorn and pretzels. Canned or dried soups. Pizza. Frozen entrees and pot pies.    If you need a refill on your cardiac medications before your next appointment,  please call your pharmacy.   Call the [place] office at [phone] if you have any questions, problems or concerns.

## 2015-02-09 NOTE — Progress Notes (Signed)
CARDIOLOGY OFFICE NOTE  Date:  02/09/2015    [name] Date of Birth: 08-Nov-1934 Medical Record [id number]  PCP:  Jerlyn Ly, MD  Cardiologist:  Nahser    Chief Complaint  Patient presents with  . Atrial Fibrillation    Follow up visit - seen for Dr. Acie Fredrickson    History of Present Illness: [name] is a 80 y.o. female who presents today for a follow up visit. Seen for Dr. Acie Fredrickson.   She has a history of chronic atrial fib, HTN, HLD, GERD, CKD and prior NSTEMI with cath showing minimal CAD by cath in 2011. Noted to have had GI bleed in 05/2014 with no source found - she was transfused.   Last seen by Dr. Acie Fredrickson back in March and had had a presyncopal spell - felt to be vasovagal. She did have an event monitor - had almost a 70% AF burden noted. No other abnormalities. Remains on anticoagulation.   Comes in today. Here alone. Here due to BP issues and concern. She does not really know what her medicines are or what she is taking. According to notes from Dr. Joylene Draft - presented there with elevated BP - had not been checking her BP regularly due to traveling. When she had her GI bleed back in April, her Diovan and HCTZ were stopped - looks like these have been added back. No real chest pain. Little short of breath - says it is at her baseline. Had one day where she felt "lighter in her head" and maybe "short of breath" - this was when BP and HR was up. She is getting too much salt - eats out - likes chips, etc.   Past Medical History  Diagnosis Date  . MI, acute, non ST segment elevation ([place]) [id number]    cath  . Coronary artery disease     minimal  . Hypertension   . Hyperlipidemia   . Hypokalemia   . Incontinence of urine   . Stromal tumor of the stomach     cancer  . Atrial fibrillation ([place])   . Arthritis   . GERD (gastroesophageal reflux disease)   . Hiatal hernia   . Osteopenia   . Bladder polyps   . IBS (irritable bowel syndrome)   . Cervical polyp     . Cataracts, bilateral   . IFG (impaired fasting glucose)   . OSA (obstructive sleep apnea)     hypoxia , not CO 2 retainer,   . OSA on CPAP 06/16/2013     Sleep study 1- 11-15 with an AHI of 61 , now titrated to CPAP 9 cm water with 2 cm EPR.   . Cancer ([place])     stomach  . Dysrhythmia      AF  hx     dr Acie Fredrickson  . Chronic kidney disease     urinary incont-    Past Surgical History  Procedure Laterality Date  . Stromal stomach  tumor  03/26/2007    resection  . Ankle fracture surgery  2006    right, plate   . Cardiac catheterization  12/07/20210    Dr.Nahser,minor cad  . Eye surgery      both cataracts  . Colonoscopy    . Upper gi endoscopy    . Knee arthroscopy Right 05/28/2012    Procedure: RIGHT KNEE ARTHROSCOPY PARTIAL MEDIAL AND LATERAL MENISECTOMY WITH CHONDROPLASTY;  Surgeon: Alta Corning, MD;  Location: [facility];  Service: Orthopedics;  Laterality: Right;  . Total knee arthroplasty Right 08/10/2013    Procedure: TOTAL KNEE ARTHROPLASTY;  Surgeon: Alta Corning, MD;  Location: [facility];  Service: Orthopedics;  Laterality: Right;  . Colonoscopy N/A Jul 20, 202016    Procedure: COLONOSCOPY;  Surgeon: Inda Castle, MD;  Location: [facility];  Service: Endoscopy;  Laterality: N/A;  . Enteroscopy N/A Jul 20, 202016    Procedure: ENTEROSCOPY;  Surgeon: Inda Castle, MD;  Location: [facility];  Service: Endoscopy;  Laterality: N/A;     Medications: Current Outpatient Prescriptions  Medication Sig Dispense Refill  . amLODipine (NORVASC) 5 MG tablet TAKE 1 TABLET BY MOUTH ONCE DAILY 30 tablet 11  . atorvastatin (LIPITOR) 20 MG tablet Take 20 mg by mouth daily.     [name] CALCIUM-VITAMIN D PO Take 1 tablet by mouth daily.    [name] doxazosin (CARDURA) 4 MG tablet Take 1 tablet (4 mg total) by mouth at bedtime. 30 tablet 6  . hydrochlorothiazide (MICROZIDE) 12.5 MG capsule Take 12.5 mg by mouth 2 (two) times daily.  3  . KLOR-CON M10 10 MEQ tablet Take 10 mEq by mouth  daily.     . Multiple Vitamin (MULTIVITAMIN) tablet Take 1 tablet by mouth daily.      . nitroGLYCERIN (NITROSTAT) 0.4 MG SL tablet Place 1 tablet (0.4 mg total) under the tongue every 5 (five) minutes as needed for chest pain. 25 tablet prn  . valsartan (DIOVAN) 160 MG tablet Take 160 mg by mouth 2 (two) times daily.  3  . XARELTO 20 MG TABS tablet TAKE 1 TABLET (20 MG TOTAL) BY MOUTH DAILY. 30 tablet 5  . ezetimibe (ZETIA) 10 MG tablet Take 5 mg by mouth daily. Reported on 02/09/2015    . metoprolol succinate (TOPROL-XL) 100 MG 24 hr tablet Take 1 tablet (100 mg total) by mouth daily. Take with or immediately following a meal. 90 tablet 3  . Multiple Vitamins-Minerals (ICAPS AREDS FORMULA PO) Take 1 capsule by mouth daily. Reported on 02/09/2015     No current facility-administered medications for this visit.    Allergies: Allergies  Allergen Reactions  . Pradaxa [Dabigatran Etexilate Mesylate] Nausea Only  . Tranexamic Acid     Patient ineligible for Tranexamic acid due to hx of NSTEMI and CAD.    Social History: The patient  reports that she has never smoked. She has never used smokeless tobacco. She reports that she drinks about 1.2 oz of alcohol per week. She reports that she does not use illicit drugs.   Family History: The patient's family history includes Diabetes in her paternal aunt; Heart disease in her paternal grandfather; Pancreatic cancer in her paternal grandmother.   Review of Systems: Please see the history of present illness.   Otherwise, the review of systems is positive for none.   All other systems are reviewed and negative.   Physical Exam: VS:  BP 160/100 mmHg  Pulse 84  Ht 5' 4.5" (1.638 m)  Wt 163 lb 6.4 oz (74.118 kg)  BMI 27.62 kg/m2  SpO2 96% .  BMI Body mass index is 27.62 kg/(m^2).  Wt Readings from Last 3 Encounters:  02/09/15 163 lb 6.4 oz (74.118 kg)  08/26/14 160 lb (72.576 kg)  06/26/14 147 lb 11.2 oz (66.996 kg)   BP is 160/90 by me.    General: Pleasant. Well developed, well nourished and in no acute distress.  HEENT: Normal. Neck: Supple, no JVD, carotid bruits, or masses noted.  Cardiac: Irregular  irregular rhythm. Her rate is ok. No edema.  Respiratory:  Lungs are clear to auscultation bilaterally with normal work of breathing.  GI: Soft and nontender.  MS: No deformity or atrophy. Gait and ROM intact. Skin: Warm and dry. Color is normal.  Neuro:  Strength and sensation are intact and no gross focal deficits noted.  Psych: Alert, appropriate and with normal affect.   LABORATORY DATA:  EKG:  EKG is ordered today. This demonstrates atrial fib with a controlled VR.  Lab Results  Component Value Date   WBC 3.9* 06/25/2014   HGB 9.7* 2020/02/2014   HCT 30.0* 2020/02/2014   PLT 211 06/25/2014   GLUCOSE 94 2020/02/2014   CHOL 123 10/30/2011   TRIG 38.0 10/30/2011   HDL 56.60 10/30/2011   LDLCALC 59 10/30/2011   ALT 10 06/24/2014   AST 17 06/24/2014   NA 133* 2020/02/2014   K 3.7 2020/02/2014   CL 105 2020/02/2014   CREATININE 0.71 2020/02/2014   BUN 7 2020/02/2014   CO2 18* 2020/02/2014   INR 2.10* 06/24/2014    BNP (last 3 results)  Recent Labs  06/24/14 2127  BNP 401.1*    ProBNP (last 3 results) No results for input(s): PROBNP in the last 8760 hours.   Other Studies Reviewed Today:  Echo Study Conclusions from 2012  - Left ventricle: The cavity size was normal. Wall thickness was increased in a pattern of mild LVH. Systolic function was normal. The estimated ejection fraction was in the range of 55% to 60%. Wall motion was normal; there were no regional wall motion abnormalities. Features are consistent with a pseudonormal left ventricular filling pattern, with concomitant abnormal relaxation and increased filling pressure (grade 2 diastolic dysfunction). - Aortic valve: Trivial regurgitation. - Mitral valve: Moderately calcified annulus. - Left atrium: The atrium was mildly  dilated.  Assessment/Plan: 1. PAF - fairly high burden on prior event monitor - I do not think we need to repeat - this will in time be her chronic rhythm. Increasing Toprol today for BP and HR control.   2. HTN - increasing the Toprol today. She is to monitor at home. She needs to cut back on the sodium use.   3. Prior GI bleed - follow closely for recurrence - would repeat her labs  4. Chronic anticoagulation - check CBC today.   Current medicines are reviewed with the patient today.  The patient does not have concerns regarding medicines other than what has been noted above.  The following changes have been made:  See above.  Labs/ tests ordered today include:    Orders Placed This Encounter  Procedures  . Basic metabolic panel  . CBC  . EKG 12-Lead     Disposition:   FU with me in 4 weeks.   Patient is agreeable to this plan and will call if any problems develop in the interim.   Signed: Burtis Junes, RN, ANP-C 02/09/2015 11:51 AM  [facility] [address] [place], [place]  [address] Phone: [phone] Fax: [phone]

## 2015-02-10 ENCOUNTER — Telehealth: Payer: Self-pay | Admitting: Cardiovascular Disease

## 2015-02-10 NOTE — Telephone Encounter (Signed)
Called patient about lab results. Per Truitt Merle NP, labs are okay. Patient verbalized understanding.

## 2015-02-10 NOTE — Telephone Encounter (Signed)
New Message  Pt states she missed a call about lab results    she will be available until 11:00am

## 2015-03-01 ENCOUNTER — Encounter (HOSPITAL_COMMUNITY)
Admission: RE | Admit: 2015-03-01 | Discharge: 2015-03-01 | Disposition: A | Discharge status: Home / Self Care | Payer: Medicare Other | Source: Ambulatory Visit | Attending: Internal Medicine | Admitting: Internal Medicine

## 2015-03-01 ENCOUNTER — Ambulatory Visit: Payer: Medicare Other | Admitting: Adult Health

## 2015-03-01 DIAGNOSIS — D649 Anemia, unspecified: Secondary | ICD-10-CM | POA: Diagnosis not present

## 2015-03-01 LAB — PREPARE RBC (CROSSMATCH)

## 2015-03-02 ENCOUNTER — Encounter (HOSPITAL_COMMUNITY)
Admission: RE | Admit: 2015-03-02 | Discharge: 2015-03-02 | Disposition: A | Discharge status: Home / Self Care | Payer: Medicare Other | Source: Ambulatory Visit | Attending: Internal Medicine | Admitting: Internal Medicine

## 2015-03-02 DIAGNOSIS — D649 Anemia, unspecified: Secondary | ICD-10-CM | POA: Diagnosis not present

## 2015-03-02 MED ORDER — FUROSEMIDE 10 MG/ML IJ SOLN
INTRAMUSCULAR | Status: AC
  Filled 2015-03-02: qty 4

## 2015-03-02 MED ORDER — FUROSEMIDE 10 MG/ML IJ SOLN
40.0000 mg | Freq: Once | INTRAMUSCULAR | Status: AC
  Administered 2015-03-02: 40 mg via INTRAVENOUS

## 2015-03-02 MED ORDER — SODIUM CHLORIDE 0.9 % IV SOLN
Freq: Once | INTRAVENOUS | Status: AC
  Administered 2015-03-02: 250 mL via INTRAVENOUS

## 2015-03-03 LAB — TYPE AND SCREEN
ABO/RH(D): A POS
Antibody Screen: NEGATIVE
UNIT DIVISION: 0
UNIT DIVISION: 0

## 2015-03-08 ENCOUNTER — Ambulatory Visit (INDEPENDENT_AMBULATORY_CARE_PROVIDER_SITE_OTHER): Payer: Medicare Other | Admitting: Nurse Practitioner

## 2015-03-08 ENCOUNTER — Encounter: Payer: Self-pay | Admitting: Nurse Practitioner

## 2015-03-08 VITALS — BP 122/84 | HR 78 | Ht 64.0 in | Wt 161.4 lb

## 2015-03-08 DIAGNOSIS — I482 Chronic atrial fibrillation, unspecified: Secondary | ICD-10-CM

## 2015-03-08 NOTE — Progress Notes (Signed)
CARDIOLOGY OFFICE NOTE  Date:  03/08/2015    [name] Date of Birth: 1934-12-06 Medical Record [id number]  PCP:  Jerlyn Ly, MD  Cardiologist:  Nahser    Chief Complaint  Patient presents with  . Hypertension    Follow up visit - seen for Dr. Acie Fredrickson  . Atrial Fibrillation    History of Present Illness: [name] is a 81 y.o. female who presents today for a one month check. Seen for Dr. Acie Fredrickson.   She has a history of chronic atrial fib, HTN, HLD, GERD, CKD and prior NSTEMI with cath showing minimal CAD by cath in 2011. Noted to have had GI bleed in 05/2014 with no source found - she was transfused.   Last seen by Dr. Acie Fredrickson back in March of 2016 and had had a presyncopal spell - felt to be vasovagal. She did have an event monitor - had almost a 70% AF burden noted. No other abnormalities. Remains on anticoagulation.   I saw her a month ago due to BP and HR issues. Getting way too much salt. I increased her Toprol. With her over 70% AF burden, I elected to continue with rate control as management.   Comes in today. Here alone. Since she was last seen here, she has had a recurrent GI bleed - required transfusion - off Xarelto. Was out of town and had noted some bright red blood on the tissue paper - this was over Christmas and she waited over a week. Really did not feel bad - she was even doing outdoor yard work. The bleeding persisted until her Xarelto was stopped.  Seeing Dr. Joylene Draft later today to discuss restarting. She remains in atrial fib. CHADSVASC is at least a 81 (age, gender, +HTN, vascular dx). HR looks better. BP looks better. She notes she "does not want to have a stroke".   Past Medical History  Diagnosis Date  . MI, acute, non ST segment elevation ([place]) [id number]    cath  . Coronary artery disease     minimal  . Hypertension   . Hyperlipidemia   . Hypokalemia   . Incontinence of urine   . Stromal tumor of the stomach     cancer  . Atrial  fibrillation ([place])   . Arthritis   . GERD (gastroesophageal reflux disease)   . Hiatal hernia   . Osteopenia   . Bladder polyps   . IBS (irritable bowel syndrome)   . Cervical polyp   . Cataracts, bilateral   . IFG (impaired fasting glucose)   . OSA (obstructive sleep apnea)     hypoxia , not CO 2 retainer,   . OSA on CPAP 06/16/2013     Sleep study 1- 11-15 with an AHI of 61 , now titrated to CPAP 9 cm water with 2 cm EPR.   . Cancer ([place])     stomach  . Dysrhythmia      AF  hx     dr Acie Fredrickson  . Chronic kidney disease     urinary incont-    Past Surgical History  Procedure Laterality Date  . Stromal stomach  tumor  03/26/2007    resection  . Ankle fracture surgery  2006    right, plate   . Cardiac catheterization  12/07/20210    Dr.Nahser,minor cad  . Eye surgery      both cataracts  . Colonoscopy    . Upper gi endoscopy    . Knee  arthroscopy Right 05/28/2012    Procedure: RIGHT KNEE ARTHROSCOPY PARTIAL MEDIAL AND LATERAL MENISECTOMY WITH CHONDROPLASTY;  Surgeon: Alta Corning, MD;  Location: [place];  Service: Orthopedics;  Laterality: Right;  . Total knee arthroplasty Right 08/10/2013    Procedure: TOTAL KNEE ARTHROPLASTY;  Surgeon: Alta Corning, MD;  Location: [place];  Service: Orthopedics;  Laterality: Right;  . Colonoscopy N/A February 13, 202016    Procedure: COLONOSCOPY;  Surgeon: Inda Castle, MD;  Location: [place];  Service: Endoscopy;  Laterality: N/A;  . Enteroscopy N/A February 13, 202016    Procedure: ENTEROSCOPY;  Surgeon: Inda Castle, MD;  Location: [place];  Service: Endoscopy;  Laterality: N/A;     Medications: Current Outpatient Prescriptions  Medication Sig Dispense Refill  . amLODipine (NORVASC) 5 MG tablet TAKE 1 TABLET BY MOUTH ONCE DAILY 30 tablet 11  . atorvastatin (LIPITOR) 40 MG tablet Take 40 mg by mouth as directed. 1/2 tablet by mouth daily    . CALCIUM-VITAMIN D PO Take 1 tablet by mouth daily.    [name] doxazosin (CARDURA) 4 MG  tablet Take 1 tablet (4 mg total) by mouth at bedtime. 30 tablet 6  . ezetimibe (ZETIA) 10 MG tablet Take 5 mg by mouth daily. Reported on 02/09/2015    . hydrochlorothiazide (MICROZIDE) 12.5 MG capsule Take 12.5 mg by mouth 2 (two) times daily.  3  . KLOR-CON M10 10 MEQ tablet Take 10 mEq by mouth daily.     . metoprolol succinate (TOPROL-XL) 100 MG 24 hr tablet Take 1 tablet (100 mg total) by mouth daily. Take with or immediately following a meal. 90 tablet 3  . Multiple Vitamins-Minerals (ICAPS AREDS FORMULA PO) Take 1 capsule by mouth daily. Reported on 02/09/2015    . nitroGLYCERIN (NITROSTAT) 0.4 MG SL tablet Place 1 tablet (0.4 mg total) under the tongue every 5 (five) minutes as needed for chest pain. 25 tablet prn  . valsartan (DIOVAN) 160 MG tablet Take 160 mg by mouth 2 (two) times daily.  3  . XARELTO 20 MG TABS tablet TAKE 1 TABLET (20 MG TOTAL) BY MOUTH DAILY. (Patient not taking: Reported on 03/08/2015) 30 tablet 5   No current facility-administered medications for this visit.    Allergies: Allergies  Allergen Reactions  . Pradaxa [Dabigatran Etexilate Mesylate] Nausea Only  . Tranexamic Acid     Patient ineligible for Tranexamic acid due to hx of NSTEMI and CAD.    Social History: The patient  reports that she has never smoked. She has never used smokeless tobacco. She reports that she drinks about 1.2 oz of alcohol per week. She reports that she does not use illicit drugs.   Family History: The patient's family history includes Diabetes in her paternal aunt; Heart disease in her paternal grandfather; Pancreatic cancer in her paternal grandmother.   Review of Systems: Please see the history of present illness.   Otherwise, the review of systems is positive for none.   All other systems are reviewed and negative.   Physical Exam: VS:  BP 122/84 mmHg  Pulse 78  Ht 5\' 4"  (1.626 m)  Wt 161 lb 6.4 oz (73.211 kg)  BMI 27.69 kg/m2 .  BMI Body mass index is 27.69  kg/(m^2).  Wt Readings from Last 3 Encounters:  03/08/15 161 lb 6.4 oz (73.211 kg)  02/09/15 163 lb 6.4 oz (74.118 kg)  08/26/14 160 lb (72.576 kg)    General: Pleasant. Elderly female who is alert and in no  acute distress.  HEENT: Normal. Neck: Supple, no JVD, carotid bruits, or masses noted.  Cardiac: Irregular irregular rhythm. Rate is ok. No edema.  Respiratory:  Lungs are clear to auscultation bilaterally with normal work of breathing.  GI: Soft and nontender.  MS: No deformity or atrophy. Gait and ROM intact. Skin: Warm and dry. Color is normal.  Neuro:  Strength and sensation are intact and no gross focal deficits noted.  Psych: Alert, appropriate and with normal affect.   LABORATORY DATA:  EKG:  EKG is ordered today. This demonstrates atrial fib with a controlled VR. Reviewed with Dr. Acie Fredrickson today.   Lab Results  Component Value Date   WBC 5.8 02/09/2015   HGB 13.9 02/09/2015   HCT 40.4 02/09/2015   PLT 203 02/09/2015   GLUCOSE 86 02/09/2015   CHOL 123 10/30/2011   TRIG 38.0 10/30/2011   HDL 56.60 10/30/2011   LDLCALC 59 10/30/2011   ALT 10 06/24/2014   AST 17 06/24/2014   NA 136 02/09/2015   K 3.7 02/09/2015   CL 98 02/09/2015   CREATININE 0.91 02/09/2015   BUN 17 02/09/2015   CO2 31 02/09/2015   INR 2.10* 06/24/2014    BNP (last 3 results)  Recent Labs  06/24/14 2127  BNP 401.1*    ProBNP (last 3 results) No results for input(s): PROBNP in the last 8760 hours.   Other Studies Reviewed Today:  Echo Study Conclusions from 2012  - Left ventricle: The cavity size was normal. Wall thickness was increased in a pattern of mild LVH. Systolic function was normal. The estimated ejection fraction was in the range of 55% to 60%. Wall motion was normal; there were no regional wall motion abnormalities. Features are consistent with a pseudonormal left ventricular filling pattern, with concomitant abnormal relaxation and increased filling  pressure (grade 2 diastolic dysfunction). - Aortic valve: Trivial regurgitation. - Mitral valve: Moderately calcified annulus. - Left atrium: The atrium was mildly dilated.  Assessment/Plan: 1. PAF - fairly high burden on prior event monitor - Toprol was increased at last visit. Her HR and BP look better today.  Would continue to manage with rate control.   2. HTN - BP has improved with increase in Toprol.   3. Prior GI bleed - she has had recurrence since her last visit here - currently off of Xarelto - this would be her second GI bleed. She apparently had no source found with her initial GI work up. I have talked with Dr. Acie Fredrickson who has subsequently seen Ms. Zanders with me - we would favor her seeing GI again to get their input; consider changing to Eliquis due to lower rate of bleeding when deemed ok to restart anticoagulation. May need to consider using 1/2 dose (DVT dosing) as well. If she were to resume anticoagulation with DOAC she would need to hold it at the first sign of bleeding. Could also consider Watchman device. I assume that Dr. Joylene Draft will be rechecking her labs today. She will be at increased risk for stroke without anticoagulation.   4. Chronic anticoagulation - currently on hold due to recent GI bleed. See plan as above.   Current medicines are reviewed with the patient today.  The patient does not have concerns regarding medicines other than what has been noted above.  The following changes have been made:  See above.  Labs/ tests ordered today include:    Orders Placed This Encounter  Procedures  . EKG 12-Lead  Disposition:   FU with Dr. Acie Fredrickson in 3 months.    Patient is agreeable to this plan and will call if any problems develop in the interim.   Signed: Burtis Junes, RN, ANP-C 03/08/2015 11:58 AM  [facility] [address] [place], [place]  [address] Phone: [phone] Fax: [phone]

## 2015-03-08 NOTE — Patient Instructions (Addendum)
We will be checking the following labs today - NONE   Medication Instructions:    Continue with your current medicines. Stay off the Xarelto for now.     Testing/Procedures To Be Arranged:  N/A  Follow-Up:   See Dr. Acie Fredrickson in 3 months.    Other Special Instructions:   I will send a note to Dr. Joylene Draft    If you need a refill on your cardiac medications before your next appointment, please call your pharmacy.   Call the [place] office at [phone] if you have any questions, problems or concerns.

## 2015-05-13 ENCOUNTER — Other Ambulatory Visit: Payer: Self-pay | Admitting: Cardiovascular Disease

## 2015-05-27 ENCOUNTER — Encounter: Payer: Self-pay | Admitting: Cardiovascular Disease

## 2015-05-30 ENCOUNTER — Encounter: Payer: Self-pay | Admitting: Cardiovascular Disease

## 2015-05-30 ENCOUNTER — Ambulatory Visit (INDEPENDENT_AMBULATORY_CARE_PROVIDER_SITE_OTHER): Payer: Medicare Other | Admitting: Cardiovascular Disease

## 2015-05-30 VITALS — BP 130/80 | HR 90 | Ht 64.0 in | Wt 165.0 lb

## 2015-05-30 DIAGNOSIS — I482 Chronic atrial fibrillation, unspecified: Secondary | ICD-10-CM

## 2015-05-30 DIAGNOSIS — R079 Chest pain, unspecified: Secondary | ICD-10-CM

## 2015-05-30 DIAGNOSIS — R0789 Other chest pain: Secondary | ICD-10-CM | POA: Diagnosis not present

## 2015-05-30 DIAGNOSIS — I5032 Chronic diastolic (congestive) heart failure: Secondary | ICD-10-CM

## 2015-05-30 NOTE — Progress Notes (Signed)
Cardiology Office Note   Date:  05/30/2015   ID:  KENJI BIDDULPH, DOB 03/04/1934, MRN [id number]  PCP:  Jerlyn Ly, MD  Cardiologist:   Thayer Headings, MD   Chief Complaint  Patient presents with  . Follow-up   1. Small NSTEMI by cardiac enzymes but minimal CAD by cath 2. LVH 3.HTN 4. Hyperlipidemia 5. Hypokalemia 6. Atrial Fibrillation  History of Present Illness:  [name] is doing fairly well. [name] is walking and exercising some. [name] notes that her heart rate is still quite variable. [name] has a hx of  PAF but typically does not notice if her HR is irregular. [name] continues to have fatigue. We tried cutting her atenolol to 25 mg daily which may have helped.   Dec. 2, 2014:  [name] is doing well. No specific cardiac issues. Occasionally has some dyspnea. [name] is more fatigued than [name] would like to be. [name] has had a sleep study and is being considered for CPAP.  [name] has had some bleeding in her stool - just occult bleeding. GI work up was negative.  [name] cannot tell when [name] is in A-fib or NSR  Dec. 3, 2015: [name] is seen for follow up of her atrial fib. . Fatigue seems to be better off the atenolol.   May 21, 2014:  [name] is a 81 y.o. female who presents for follow-up of her recent syncopal episode.   The  episode of pre-symcope occurred after [name] got out of the shower.  [name] had an episode of syncope that may have been due to a vasovagal reaction. [name] is 1 monitor the past several weeks [name]'s not had any slow heart rates that would explain the cause of syncope.  [name] has had RUQ pain.  Seems to be worse after eating lunch today.  Associated with belching.  Exercising regularly.  Feeling well   May 30, 2015:  [name] is doing well.   BP was a little elevated - Dr. Joylene Draft started Lasix 20 mg a day in addition to her HCTZ 12. 5 mg a day .  BP is now better.  Has occasional chest pressure - indigestion like pain , may be related to eating lunch .    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   [name] does not have any CP with her work outs.  Does have some DOE  - occasionally with housework   Records from Dr. Joylene Draft were reviewed.   Past Medical History  Diagnosis Date  . MI, acute, non ST segment elevation ([place]) [id number]    cath  . Coronary artery disease     minimal  . Hypertension   . Hyperlipidemia   . Hypokalemia   . Incontinence of urine   . Stromal tumor of the stomach     cancer  . Atrial fibrillation ([place])   . Arthritis   . GERD (gastroesophageal reflux disease)   . Hiatal hernia   . Osteopenia   . Bladder polyps   . IBS (irritable bowel syndrome)   . Cervical polyp   . Cataracts, bilateral   . IFG (impaired fasting glucose)   . OSA (obstructive sleep apnea)     hypoxia , not CO 2 retainer,   . OSA on CPAP 06/16/2013     Sleep study 1- 11-15 with an AHI of 61 , now titrated to CPAP 9 cm water with 2 cm EPR.   [name]  Cancer ([place])     stomach  . Dysrhythmia      AF  hx     dr Acie Fredrickson  . Chronic kidney disease     urinary incont-    Past Surgical History  Procedure Laterality Date  . Stromal stomach  tumor  03/26/2007    resection  . Ankle fracture surgery  2006    right, plate   . Cardiac catheterization  12/07/20210    Dr.Makoto Sellitto,minor cad  . Eye surgery      both cataracts  . Colonoscopy    . Upper gi endoscopy    . Knee arthroscopy Right 05/28/2012    Procedure: RIGHT KNEE ARTHROSCOPY PARTIAL MEDIAL AND LATERAL MENISECTOMY WITH CHONDROPLASTY;  Surgeon: Alta Corning, MD;  Location: [facility];  Service: Orthopedics;  Laterality: Right;  . Total knee arthroplasty Right 08/10/2013    Procedure: TOTAL KNEE ARTHROPLASTY;  Surgeon: Alta Corning, MD;  Location: [place];  Service: Orthopedics;  Laterality: Right;  . Colonoscopy N/A Jun 06, 202016    Procedure: COLONOSCOPY;  Surgeon: Inda Castle, MD;  Location: [place];  Service: Endoscopy;  Laterality: N/A;  .  Enteroscopy N/A Jun 06, 202016    Procedure: ENTEROSCOPY;  Surgeon: Inda Castle, MD;  Location: [facility];  Service: Endoscopy;  Laterality: N/A;     Current Outpatient Prescriptions  Medication Sig Dispense Refill  . amLODipine (NORVASC) 5 MG tablet TAKE 1 TABLET BY MOUTH ONCE DAILY 30 tablet 11  . atorvastatin (LIPITOR) 40 MG tablet Take 40 mg by mouth as directed. 1/2 tablet by mouth daily    . CALCIUM-VITAMIN D PO Take 1 tablet by mouth daily.    [name] doxazosin (CARDURA) 4 MG tablet Take 1 tablet (4 mg total) by mouth at bedtime. 30 tablet 6  . ELIQUIS 5 MG TABS tablet Take 5 mg by mouth 2 (two) times daily.  11  . ezetimibe (ZETIA) 10 MG tablet Take 5 mg by mouth daily. Reported on 02/09/2015    . furosemide (LASIX) 20 MG tablet Take 20 mg by mouth every morning.  11  . hydrochlorothiazide (MICROZIDE) 12.5 MG capsule Take 12.5 mg by mouth 2 (two) times daily.  3  . KLOR-CON M10 10 MEQ tablet Take 10 mEq by mouth daily.     . metoprolol succinate (TOPROL-XL) 100 MG 24 hr tablet Take 1 tablet (100 mg total) by mouth daily. Take with or immediately following a meal. 90 tablet 3  . Multiple Vitamins-Minerals (ICAPS AREDS FORMULA PO) Take 1 capsule by mouth daily. Reported on 02/09/2015    . nitroGLYCERIN (NITROSTAT) 0.4 MG SL tablet Place 1 tablet (0.4 mg total) under the tongue every 5 (five) minutes as needed for chest pain. 25 tablet prn  . valsartan (DIOVAN) 160 MG tablet Take 160 mg by mouth 2 (two) times daily.  3   No current facility-administered medications for this visit.    Allergies:   Pradaxa and Tranexamic acid    Social History:  The patient  reports that [name] has never smoked. [name] has never used smokeless tobacco. [name] reports that [name] drinks about 1.2 oz of alcohol per week. [name] reports that [name] does not use illicit drugs.   Family History:  The patient's family history includes Diabetes in her paternal aunt; Heart disease in her paternal grandfather; Pancreatic cancer in  her paternal grandmother.    ROS:  Please see the history of present illness.    Review of Systems: Constitutional:  denies fever,  chills, diaphoresis, appetite change and fatigue.  HEENT: denies photophobia, eye pain, redness, hearing loss, ear pain, congestion, sore throat, rhinorrhea, sneezing, neck pain, neck stiffness and tinnitus.  Respiratory: denies SOB, DOE, cough, chest tightness, and wheezing.  Cardiovascular: denies chest pain, palpitations and leg swelling.  Gastrointestinal: denies nausea, vomiting, abdominal pain, diarrhea, constipation, blood in stool.  Genitourinary: denies dysuria, urgency, frequency, hematuria, flank pain and difficulty urinating.  Musculoskeletal: denies  myalgias, back pain, joint swelling, arthralgias and gait problem.   Skin: denies pallor, rash and wound.  Neurological: denies dizziness, seizures, syncope, weakness, light-headedness, numbness and headaches.   Hematological: denies adenopathy, easy bruising, personal or family bleeding history.  Psychiatric/ Behavioral: denies suicidal ideation, mood changes, confusion, nervousness, sleep disturbance and agitation.       All other systems are reviewed and negative.    PHYSICAL EXAM: VS:  BP 130/80 mmHg  Pulse 90  Ht 5\' 4"  (1.626 m)  Wt 165 lb (74.844 kg)  BMI 28.31 kg/m2 , BMI Body mass index is 28.31 kg/(m^2). GEN: Well nourished, well developed, in no acute distress HEENT: normal Neck: no JVD, carotid bruits, or masses Cardiac: Irreg. Irreg. ; no murmurs, rubs, or gallops,no edema  Respiratory:  clear to auscultation bilaterally, normal work of breathing GI: soft, nontender, nondistended, + BS MS: no deformity or atrophy Skin: warm and dry, no rash Neuro:  Strength and sensation are intact Psych: normal   EKG:  EKG is not ordered today.    Recent Labs: 06/24/2014: ALT 10; B Natriuretic Peptide 401.1* 06/25/2014: Magnesium 1.7 02/09/2015: BUN 17; Creat 0.91; Hemoglobin 13.9;  Platelets 203; Potassium 3.7; Sodium 136    Lipid Panel    Component Value Date/Time   CHOL 123 10/30/2011 0928   TRIG 38.0 10/30/2011 0928   HDL 56.60 10/30/2011 0928   CHOLHDL 2 10/30/2011 0928   VLDL 7.6 10/30/2011 0928   LDLCALC 59 10/30/2011 0928      Wt Readings from Last 3 Encounters:  05/30/15 165 lb (74.844 kg)  03/08/15 161 lb 6.4 oz (73.211 kg)  02/09/15 163 lb 6.4 oz (74.118 kg)      Other studies Reviewed: Additional studies/ records that were reviewed today include: . Review of the above records demonstrates:    ASSESSMENT AND PLAN:  1. Small NSTEMI by cardiac enzymes but minimal CAD by cath on Dec. 7, 2010. [name] has had some atypical CP . Likely due to indigestion .  [name] will call us back if the pain worsens.  2. LVH 3.HTN - Dr. Joylene Draft added Furosemide to her medical regimin. Will get an echo  4. Hyperlipidemia - managed by Dr. Joylene Draft  5. Hypokalemia 6. Atrial Fibrillation - chronic  7. Pre-syncope :      Current medicines are reviewed at length with the patient today.  The patient does not have concerns regarding medicines.  The following changes have been made:  no change  Labs/ tests ordered today include:  No orders of the defined types were placed in this encounter.     Disposition:   FU with me in 6 months   Signed, Ac Colan, Wonda Cheng, MD  05/30/2015 10:38 AM    [facility] [place], [place], [place]  [address] Phone: [phone]; Fax: [phone]

## 2015-05-30 NOTE — Patient Instructions (Signed)
Medication Instructions:  Your physician recommends that you continue on your current medications as directed. Please refer to the Current Medication list given to you today.   Labwork: None Ordered   Testing/Procedures: Your physician has requested that you have an echocardiogram. Echocardiography is a painless test that uses sound waves to create images of your heart. It provides your doctor with information about the size and shape of your heart and how well your heart's chambers and valves are working. This procedure takes approximately one hour. There are no restrictions for this procedure.   Follow-Up Your physician wants you to follow-up in: 6 months with Dr. Nahser.  You will receive a reminder letter in the mail two months in advance. If you don't receive a letter, please call our office to schedule the follow-up appointment.   If you need a refill on your cardiac medications before your next appointment, please call your pharmacy.   Thank you for choosing [facility]! Billyjack Trompeter, RN [phone]    

## 2015-06-07 ENCOUNTER — Other Ambulatory Visit: Payer: Self-pay | Admitting: Internal Medicine

## 2015-06-07 DIAGNOSIS — R109 Unspecified abdominal pain: Secondary | ICD-10-CM

## 2015-06-11 ENCOUNTER — Other Ambulatory Visit: Payer: Self-pay | Admitting: Cardiovascular Disease

## 2015-06-16 ENCOUNTER — Ambulatory Visit (HOSPITAL_COMMUNITY): Payer: Medicare Other | Attending: Cardiology

## 2015-06-16 ENCOUNTER — Other Ambulatory Visit: Payer: Self-pay

## 2015-06-16 DIAGNOSIS — I5032 Chronic diastolic (congestive) heart failure: Secondary | ICD-10-CM | POA: Diagnosis not present

## 2015-06-16 DIAGNOSIS — R079 Chest pain, unspecified: Secondary | ICD-10-CM | POA: Diagnosis not present

## 2015-06-16 DIAGNOSIS — I509 Heart failure, unspecified: Secondary | ICD-10-CM | POA: Diagnosis present

## 2015-06-16 DIAGNOSIS — I340 Nonrheumatic mitral (valve) insufficiency: Secondary | ICD-10-CM | POA: Insufficient documentation

## 2015-06-16 DIAGNOSIS — I517 Cardiomegaly: Secondary | ICD-10-CM | POA: Insufficient documentation

## 2015-06-23 ENCOUNTER — Ambulatory Visit
Admission: RE | Admit: 2015-06-23 | Discharge: 2015-06-23 | Disposition: A | Discharge status: Home / Self Care | Payer: Medicare Other | Source: Ambulatory Visit | Attending: Internal Medicine | Admitting: Internal Medicine

## 2015-06-23 DIAGNOSIS — R109 Unspecified abdominal pain: Secondary | ICD-10-CM

## 2015-06-29 ENCOUNTER — Telehealth: Payer: Self-pay | Admitting: Cardiovascular Disease

## 2015-06-29 NOTE — Telephone Encounter (Signed)
Pt is at low risk for upcoming procedure. She may hold her Eliquis for 3 days prior to surgery

## 2015-06-29 NOTE — Telephone Encounter (Signed)
New message      Request for surgical clearance:  What type of surgery is being performed? Rt knee arthoscopy with hardware removal When is this surgery scheduled? Pending clearance Are there any medications that need to be held prior to surgery and how long?  Will she need to hold her eliquis?  If yes, how long?  And, need cardiac clearance Name of physician performing surgery?  Dr Dorna Leitz 1. What is your office phone and fax number? [phone]

## 2015-07-01 ENCOUNTER — Telehealth: Payer: Self-pay | Admitting: Cardiovascular Disease

## 2015-07-01 NOTE — Telephone Encounter (Signed)
I spoke with Elmyra Ricks at Dr. Berenice Primas office.  Confirmed fax # is [phone]. 06/29/15 phone note from Dr. Acie Fredrickson was faxed to the above # and confirmation received.

## 2015-07-01 NOTE — Telephone Encounter (Signed)
New Message:  [name] called in that a form be faxed to Dr. Berenice Primas office stating that the pt is cleared to have ankle surgery. This can be faxed to [phone].

## 2015-07-12 ENCOUNTER — Telehealth: Payer: Self-pay | Admitting: Adult Health

## 2015-07-12 NOTE — Telephone Encounter (Signed)
Rn call Juliann Pulse at [phone]. Rn was told per Juliann Pulse they dont need sleep study results, pt has been cleared for orthopedic surgery.

## 2015-07-12 NOTE — Telephone Encounter (Signed)
[facility]  [place] 5/17-REQ SLEEP STUDY REPORT FAX #336 [place] EXT [phone]

## 2015-08-07 ENCOUNTER — Other Ambulatory Visit: Payer: Self-pay | Admitting: Cardiovascular Disease

## 2015-09-20 ENCOUNTER — Other Ambulatory Visit: Payer: Self-pay | Admitting: *Deleted

## 2015-09-20 MED ORDER — DOXAZOSIN MESYLATE 4 MG PO TABS: 4.0000 mg | ORAL_TABLET | Freq: Every day | ORAL | 11 refills | Status: DC

## 2015-11-01 ENCOUNTER — Encounter: Payer: Self-pay | Admitting: Cardiovascular Disease

## 2015-11-15 ENCOUNTER — Ambulatory Visit: Payer: Medicare Other | Admitting: Cardiovascular Disease

## 2016-02-15 ENCOUNTER — Other Ambulatory Visit: Payer: Self-pay | Admitting: Cardiovascular Disease

## 2016-04-27 ENCOUNTER — Emergency Department (HOSPITAL_COMMUNITY)
Admission: EM | Admit: 2016-04-27 | Discharge: 2016-04-27 | Disposition: A | Discharge status: Home / Self Care | Payer: Medicare Other | Attending: Emergency Medicine | Admitting: Emergency Medicine

## 2016-04-27 ENCOUNTER — Emergency Department (HOSPITAL_COMMUNITY): Payer: Medicare Other

## 2016-04-27 ENCOUNTER — Encounter (HOSPITAL_COMMUNITY): Payer: Self-pay

## 2016-04-27 DIAGNOSIS — I252 Old myocardial infarction: Secondary | ICD-10-CM | POA: Insufficient documentation

## 2016-04-27 DIAGNOSIS — I5032 Chronic diastolic (congestive) heart failure: Secondary | ICD-10-CM | POA: Insufficient documentation

## 2016-04-27 DIAGNOSIS — I130 Hypertensive heart and chronic kidney disease with heart failure and stage 1 through stage 4 chronic kidney disease, or unspecified chronic kidney disease: Secondary | ICD-10-CM | POA: Insufficient documentation

## 2016-04-27 DIAGNOSIS — E876 Hypokalemia: Secondary | ICD-10-CM | POA: Diagnosis not present

## 2016-04-27 DIAGNOSIS — I4891 Unspecified atrial fibrillation: Secondary | ICD-10-CM | POA: Diagnosis not present

## 2016-04-27 DIAGNOSIS — R002 Palpitations: Secondary | ICD-10-CM | POA: Diagnosis present

## 2016-04-27 DIAGNOSIS — Z85028 Personal history of other malignant neoplasm of stomach: Secondary | ICD-10-CM | POA: Insufficient documentation

## 2016-04-27 DIAGNOSIS — Z79899 Other long term (current) drug therapy: Secondary | ICD-10-CM | POA: Insufficient documentation

## 2016-04-27 DIAGNOSIS — N189 Chronic kidney disease, unspecified: Secondary | ICD-10-CM | POA: Diagnosis not present

## 2016-04-27 DIAGNOSIS — Z96651 Presence of right artificial knee joint: Secondary | ICD-10-CM | POA: Insufficient documentation

## 2016-04-27 DIAGNOSIS — I2510 Atherosclerotic heart disease of native coronary artery without angina pectoris: Secondary | ICD-10-CM | POA: Diagnosis not present

## 2016-04-27 LAB — BASIC METABOLIC PANEL
ANION GAP: 13 (ref 5–15)
BUN: 27 mg/dL — ABNORMAL HIGH (ref 6–20)
CHLORIDE: 100 mmol/L — AB (ref 101–111)
CO2: 24 mmol/L (ref 22–32)
CREATININE: 0.98 mg/dL (ref 0.44–1.00)
Calcium: 9.8 mg/dL (ref 8.9–10.3)
GFR calc non Af Amer: 53 mL/min — ABNORMAL LOW (ref >60)
Glucose, Bld: 99 mg/dL (ref 65–99)
POTASSIUM: 3 mmol/L — AB (ref 3.5–5.1)
SODIUM: 137 mmol/L (ref 135–145)

## 2016-04-27 LAB — I-STAT TROPONIN, ED: TROPONIN I, POC: 0 ng/mL (ref 0.00–0.08)

## 2016-04-27 LAB — CBC WITH DIFFERENTIAL/PLATELET
Basophils Absolute: 0 10*3/uL (ref 0.0–0.1)
Basophils Relative: 0 %
Eosinophils Absolute: 0 10*3/uL (ref 0.0–0.7)
Eosinophils Relative: 0 %
HEMATOCRIT: 39.4 % (ref 36.0–46.0)
HEMOGLOBIN: 13.3 g/dL (ref 12.0–15.0)
LYMPHS ABS: 1.2 10*3/uL (ref 0.7–4.0)
LYMPHS PCT: 26 %
MCH: 29.9 pg (ref 26.0–34.0)
MCHC: 33.8 g/dL (ref 30.0–36.0)
MCV: 88.5 fL (ref 78.0–100.0)
Monocytes Absolute: 0.6 10*3/uL (ref 0.1–1.0)
Monocytes Relative: 13 %
NEUTROS ABS: 2.9 10*3/uL (ref 1.7–7.7)
NEUTROS PCT: 61 %
Platelets: 143 10*3/uL — ABNORMAL LOW (ref 150–400)
RBC: 4.45 MIL/uL (ref 3.87–5.11)
RDW: 13.5 % (ref 11.5–15.5)
WBC: 4.8 10*3/uL (ref 4.0–10.5)

## 2016-04-27 MED ORDER — POTASSIUM CHLORIDE CRYS ER 20 MEQ PO TBCR: 20.0000 meq | EXTENDED_RELEASE_TABLET | Freq: Two times a day (BID) | ORAL | 0 refills | Status: DC

## 2016-04-27 NOTE — ED Notes (Signed)
Delay in lab draw,  Pt not in room at this time. 

## 2016-04-27 NOTE — ED Provider Notes (Signed)
[facility] DEPT Provider Note   CSN: [id number] Arrival date & time: 04/27/16  1804     History   Chief Complaint Chief Complaint  Patient presents with  . Palpitations    HPI [name] is a 82 y.o. female.  HPI Patient has history of atrial fibrillation. Reviewing records appears to be chronic A. fib. She's had shortness of breath over the last couple days. States she possibly missed her evening medicines the last 2 days. She states that her husband died a month ago and she has had some stress dealing with the state. States she had difficult meeting this afternoon. States that she had a feeling of shortness of breath with it and that it did not improve after the meeting was done. Seen by her primary care doctor and found to have a heart rate of A. fib at 120 to 130. No chest pain. No fevers. No cough. No swelling or legs. Mild sinus pressure. She is on anticoagulation for her A. fib.   Past Medical History:  Diagnosis Date  . Arthritis   . Atrial fibrillation ([place])   . Bladder polyps   . Cancer ([place])    stomach  . Cataracts, bilateral   . Cervical polyp   . Chronic kidney disease    urinary incont-  . Coronary artery disease    minimal  . Dysrhythmia     AF  hx     dr Acie Fredrickson  . GERD (gastroesophageal reflux disease)   . Hiatal hernia   . Hyperlipidemia   . Hypertension   . Hypokalemia   . IBS (irritable bowel syndrome)   . IFG (impaired fasting glucose)   . Incontinence of urine   . MI, acute, non ST segment elevation ([facility]) [id number]   cath  . OSA (obstructive sleep apnea)    hypoxia , not CO 2 retainer,   . OSA on CPAP 06/16/2013    Sleep study 1- 11-15 with an AHI of 61 , now titrated to CPAP 9 cm water with 2 cm EPR.   . Osteopenia   . Stromal tumor of the stomach    cancer    Patient Active Problem List   Diagnosis Date Noted  . Chest pain 05/30/2015  . Internal and external hemorrhoids without complication [id number]  . GI bleed 06/24/2014  .  Acute blood loss anemia 06/24/2014  . Hypertension 06/24/2014  . Chronic diastolic heart failure ([place]) 06/24/2014  . Chronic atrial fibrillation ([place]) 06/24/2014  . Sleep apnea 06/24/2014  . GIB (gastrointestinal bleeding) 06/24/2014  . Hematochezia 06/21/2014  . Pre-syncope 05/05/2014  . Osteoarthritis of right knee 08/10/2013  . OSA on CPAP 06/16/2013  . OSA (obstructive sleep apnea)   . Atrial fibrillation ([place]) 12/15/2010  . Bradycardia 10/25/2010  . Fatigue 10/25/2010  . Dyspnea 09/18/2010  . MI, acute, non ST segment elevation ([place])   . Coronary artery disease   . Hypokalemia   . Incontinence of urine   . Stromal tumor of the stomach   . Dyslipidemia 05/06/2007  . Essential hypertension 05/06/2007  . GERD 05/06/2007  . IRRITABLE BOWEL SYNDROME 05/06/2007    Past Surgical History:  Procedure Laterality Date  . ANKLE FRACTURE SURGERY  2006   right, plate   . CARDIAC CATHETERIZATION  12/07/20210   Dr.Nahser,minor cad  . COLONOSCOPY    . COLONOSCOPY N/A Sep 13, 202016   Procedure: COLONOSCOPY;  Surgeon: Inda Castle, MD;  Location: [facility];  Service: Endoscopy;  Laterality: N/A;  .  ENTEROSCOPY N/A 03-Mar-202016   Procedure: ENTEROSCOPY;  Surgeon: Inda Castle, MD;  Location: [facility];  Service: Endoscopy;  Laterality: N/A;  . EYE SURGERY     both cataracts  . KNEE ARTHROSCOPY Right 05/28/2012   Procedure: RIGHT KNEE ARTHROSCOPY PARTIAL MEDIAL AND LATERAL MENISECTOMY WITH CHONDROPLASTY;  Surgeon: Alta Corning, MD;  Location: [facility];  Service: Orthopedics;  Laterality: Right;  . stromal stomach  tumor  03/26/2007   resection  . TOTAL KNEE ARTHROPLASTY Right 08/10/2013   Procedure: TOTAL KNEE ARTHROPLASTY;  Surgeon: Alta Corning, MD;  Location: [facility];  Service: Orthopedics;  Laterality: Right;  . UPPER GI ENDOSCOPY      OB History    No data available       Home Medications    Prior to Admission medications   Medication Sig Start Date End  Date Taking? Authorizing Provider  amLODipine (NORVASC) 5 MG tablet TAKE 1 TABLET BY MOUTH ONCE DAILY 02/15/16  Yes Thayer Headings, MD  atorvastatin (LIPITOR) 40 MG tablet Take 20 mg by mouth daily at 6 PM. 1/2 tablet by mouth daily    Yes Historical Provider, MD  calcium carbonate (TUMS - DOSED IN MG ELEMENTAL CALCIUM) 500 MG chewable tablet Chew 1 tablet by mouth 3 (three) times daily as needed for indigestion or heartburn.   Yes Historical Provider, MD  cholecalciferol (VITAMIN D) 1000 units tablet Take 1,000 Units by mouth daily.   Yes Historical Provider, MD  doxazosin (CARDURA) 4 MG tablet Take 1 tablet (4 mg total) by mouth at bedtime. 09/20/15  Yes Thayer Headings, MD  ELIQUIS 5 MG TABS tablet Take 5 mg by mouth 2 (two) times daily. 04/07/15  Yes Historical Provider, MD  esomeprazole (NEXIUM) 40 MG capsule Take 40 mg by mouth daily at 12 noon.   Yes Historical Provider, MD  furosemide (LASIX) 20 MG tablet Take 20 mg by mouth every morning. 05/23/15  Yes Historical Provider, MD  hydrochlorothiazide (MICROZIDE) 12.5 MG capsule TAKE ONE CAPSULE BY MOUTH TWICE A DAY 08/08/15  Yes Thayer Headings, MD  ibandronate (BONIVA) 150 MG tablet Take 150 mg by mouth every 30 (thirty) days. Take in the morning with a full glass of water, on an empty stomach, and do not take anything else by mouth or lie down for the next 30 min.   Yes Historical Provider, MD  iron polysaccharides (NIFEREX) 150 MG capsule Take 150 mg by mouth daily.   Yes Historical Provider, MD  metoprolol succinate (TOPROL-XL) 50 MG 24 hr tablet Take 50 mg by mouth daily. Take with or immediately following a meal.   Yes Historical Provider, MD  Multiple Vitamin (MULTIVITAMIN WITH MINERALS) TABS tablet Take 1 tablet by mouth daily.   Yes Historical Provider, MD  multivitamin-lutein (OCUVITE-LUTEIN) CAPS capsule Take 1 capsule by mouth daily.   Yes Historical Provider, MD  nitroGLYCERIN (NITROSTAT) 0.4 MG SL tablet Place 1 tablet (0.4 mg total)  under the tongue every 5 (five) minutes as needed for chest pain. 10/05/13  Yes Thayer Headings, MD  Probiotic Product (ALIGN) 4 MG CAPS Take 4 mg by mouth daily.   Yes Historical Provider, MD  valsartan (DIOVAN) 160 MG tablet TAKE 1 TABLET BY MOUTH TWICE A DAY 06/13/15  Yes Thayer Headings, MD  potassium chloride SA (K-DUR,KLOR-CON) 20 MEQ tablet Take 1 tablet (20 mEq total) by mouth 2 (two) times daily. 04/27/16   Davonna Belling, MD    Family History  Family History  Problem Relation Age of Onset  . Pancreatic cancer Paternal Grandmother   . Heart disease Paternal Grandfather     both sides of the family  . Diabetes Paternal Aunt     Social History Social History  Substance Use Topics  . Smoking status: Never Smoker  . Smokeless tobacco: Never Used  . Alcohol use 1.2 oz/week    2 Glasses of wine per week     Comment: one per week     Allergies   Detrol [tolterodine]; Tranexamic acid; Xarelto [rivaroxaban]; and Pradaxa [dabigatran etexilate mesylate]   Review of Systems Review of Systems  Constitutional: Negative for activity change and appetite change.  HENT: Positive for congestion.   Eyes: Negative for pain.  Respiratory: Positive for shortness of breath. Negative for chest tightness.   Cardiovascular: Negative for chest pain and leg swelling.  Gastrointestinal: Negative for abdominal pain, diarrhea, nausea and vomiting.  Genitourinary: Negative for flank pain.  Musculoskeletal: Negative for back pain and neck stiffness.  Skin: Negative for rash.  Neurological: Negative for weakness, numbness and headaches.  Psychiatric/Behavioral: Negative for behavioral problems. The patient is nervous/anxious.      Physical Exam Updated Vital Signs BP 132/79   Pulse 93   Temp 97.7 F (36.5 C) (Oral)   Resp 26   SpO2 96%   Physical Exam  Constitutional: She appears well-developed.  HENT:  Head: Atraumatic.  Eyes: EOM are normal.  Neck: Neck supple.  Cardiovascular:  Normal rate.   Irregular rhythm. Normal has regular rate but does at times, little tachycardic to around 120  Abdominal: Soft. There is no tenderness.  Musculoskeletal: She exhibits edema.  Mild bilateral lower extremity pitting edema.  Neurological: She is alert.  Skin: Skin is warm. Capillary refill takes less than 2 seconds.  Psychiatric: She has a normal mood and affect.     ED Treatments / Results  Labs (all labs ordered are listed, but only abnormal results are displayed) Labs Reviewed  BASIC METABOLIC PANEL - Abnormal; Notable for the following:       Result Value   Potassium 3.0 (*)    Chloride 100 (*)    BUN 27 (*)    GFR calc non Af Amer 53 (*)    All other components within normal limits  CBC WITH DIFFERENTIAL/PLATELET - Abnormal; Notable for the following:    Platelets 143 (*)    All other components within normal limits  I-STAT TROPOININ, ED    EKG  EKG Interpretation  Date/Time:  Friday April 27 2016 18:08:38 EST Ventricular Rate:  95 PR Interval:    QRS Duration: 95 QT Interval:  386 QTC Calculation: 486 R Axis:   52 Text Interpretation:  Atrial fibrillation Borderline prolonged QT interval Confirmed by Alvino Chapel  MD, Safiya Girdler [phone]) on 04/27/2016 8:39:36 PM       Radiology Dg Chest 2 View  Result Date: 04/27/2016 CLINICAL DATA:  Tachycardia. EXAM: CHEST  2 VIEW COMPARISON:  07/28/2013 CXR FINDINGS: Stable cardiomegaly. Tortuous atherosclerotic aorta without aneurysm. Mild chronic interstitial prominence bilaterally without pulmonary consolidation, effusion or pneumothorax. Mild dextroconvex curvature of the thoracolumbar junction. IMPRESSION: Chronic stable cardiomegaly and aortic atherosclerosis. Chronic interstitial lung disease with stable pulmonary interstitial prominence bilaterally. No active disease. Electronically Signed   By: Ashley Royalty M.D.   On: 04/27/2016 19:37    Procedures Procedures (including critical care time)  Medications Ordered in  ED Medications - No data to display   Initial Impression / Assessment  and Plan / ED Course  I have reviewed the triage vital signs and the nursing notes.  Pertinent labs & imaging results that were available during my care of the patient were reviewed by me and considered in my medical decision making (see chart for details).     Patient was shortness of breath. History of A. fib already on anticoagulation. Appears to be in chronic A. fib. He did have some mild RVR that has resolved. Patient feels better. mild dehydration. Mild mild hypokalemia. Patient feels better will discharge home. She states she had missed a couple doses of her evening medicines.  Final Clinical Impressions(s) / ED Diagnoses   Final diagnoses:  Atrial fibrillation with RVR (HCC)  Hypokalemia    New Prescriptions New Prescriptions   POTASSIUM CHLORIDE SA (K-DUR,KLOR-CON) 20 MEQ TABLET    Take 1 tablet (20 mEq total) by mouth 2 (two) times daily.     Davonna Belling, MD 04/27/16 2140

## 2016-04-27 NOTE — ED Triage Notes (Signed)
Per EMS - pt was at PCP, had felt as if heart was racing all day long. Reports stress x 1 month after husband passed away. 12-lead at PCP was afib 120-130bpm. 12-lead w/ EMS was afib 80-100bpm. Pt reports potentially missing some doses of medications d/t stress. Denies CP. No other complaints.

## 2016-04-27 NOTE — ED Notes (Signed)
Patient transported to X-ray 

## 2016-05-17 ENCOUNTER — Other Ambulatory Visit: Payer: Self-pay | Admitting: Cardiovascular Disease

## 2016-06-02 ENCOUNTER — Other Ambulatory Visit: Payer: Self-pay | Admitting: Cardiovascular Disease

## 2016-06-25 ENCOUNTER — Other Ambulatory Visit: Payer: Self-pay | Admitting: Cardiovascular Disease

## 2016-07-11 ENCOUNTER — Other Ambulatory Visit: Payer: Self-pay | Admitting: Cardiovascular Disease

## 2016-07-25 ENCOUNTER — Other Ambulatory Visit: Payer: Self-pay | Admitting: Cardiovascular Disease

## 2016-08-27 ENCOUNTER — Ambulatory Visit
Admission: RE | Admit: 2016-08-27 | Discharge: 2016-08-27 | Disposition: A | Discharge status: Home / Self Care | Payer: Medicare Other | Source: Ambulatory Visit | Attending: Internal Medicine | Admitting: Internal Medicine

## 2016-08-27 ENCOUNTER — Other Ambulatory Visit: Payer: Self-pay | Admitting: Internal Medicine

## 2016-08-27 DIAGNOSIS — S32050A Wedge compression fracture of fifth lumbar vertebra, initial encounter for closed fracture: Secondary | ICD-10-CM

## 2016-08-31 ENCOUNTER — Other Ambulatory Visit: Payer: Self-pay | Admitting: Otolaryngology

## 2016-08-31 DIAGNOSIS — H903 Sensorineural hearing loss, bilateral: Secondary | ICD-10-CM

## 2016-09-13 ENCOUNTER — Ambulatory Visit
Admission: RE | Admit: 2016-09-13 | Discharge: 2016-09-13 | Disposition: A | Discharge status: Home / Self Care | Payer: Medicare Other | Source: Ambulatory Visit | Attending: Otolaryngology | Admitting: Otolaryngology

## 2016-09-13 DIAGNOSIS — H903 Sensorineural hearing loss, bilateral: Secondary | ICD-10-CM

## 2016-09-13 MED ORDER — GADOBENATE DIMEGLUMINE 529 MG/ML IV SOLN
14.0000 mL | Freq: Once | INTRAVENOUS | Status: AC | PRN
  Administered 2016-09-13: 14 mL via INTRAVENOUS

## 2016-09-24 ENCOUNTER — Other Ambulatory Visit: Payer: Self-pay | Admitting: Cardiovascular Disease

## 2016-09-27 ENCOUNTER — Telehealth: Payer: Self-pay | Admitting: Cardiovascular Disease

## 2016-09-27 NOTE — Telephone Encounter (Signed)
Close encounter 

## 2016-10-09 ENCOUNTER — Other Ambulatory Visit: Payer: Self-pay | Admitting: Internal Medicine

## 2016-10-09 DIAGNOSIS — M5416 Radiculopathy, lumbar region: Secondary | ICD-10-CM

## 2016-10-22 ENCOUNTER — Ambulatory Visit
Admission: RE | Admit: 2016-10-22 | Discharge: 2016-10-22 | Disposition: A | Discharge status: Home / Self Care | Payer: Medicare Other | Source: Ambulatory Visit | Attending: Internal Medicine | Admitting: Internal Medicine

## 2016-10-22 ENCOUNTER — Other Ambulatory Visit: Payer: Self-pay | Admitting: Cardiovascular Disease

## 2016-10-22 DIAGNOSIS — M5416 Radiculopathy, lumbar region: Secondary | ICD-10-CM

## 2016-10-22 MED ORDER — METHYLPREDNISOLONE ACETATE 40 MG/ML INJ SUSP (RADIOLOG
120.0000 mg | Freq: Once | INTRAMUSCULAR | Status: AC
  Administered 2016-10-22: 120 mg via EPIDURAL

## 2016-10-22 MED ORDER — IOPAMIDOL (ISOVUE-M 200) INJECTION 41%
1.0000 mL | Freq: Once | INTRAMUSCULAR | Status: AC
  Administered 2016-10-22: 1 mL via EPIDURAL

## 2016-10-22 NOTE — Discharge Instructions (Signed)

## 2016-11-01 ENCOUNTER — Encounter: Payer: Self-pay | Admitting: Cardiovascular Disease

## 2016-11-06 ENCOUNTER — Encounter: Payer: Self-pay | Admitting: Cardiovascular Disease

## 2016-11-06 ENCOUNTER — Ambulatory Visit (INDEPENDENT_AMBULATORY_CARE_PROVIDER_SITE_OTHER): Payer: Medicare Other | Admitting: Cardiovascular Disease

## 2016-11-06 VITALS — BP 138/70 | HR 90 | Ht 64.5 in | Wt 142.4 lb

## 2016-11-06 DIAGNOSIS — I482 Chronic atrial fibrillation, unspecified: Secondary | ICD-10-CM

## 2016-11-06 NOTE — Progress Notes (Signed)
Cardiology Office Note   Date:  11/06/2016   ID:  [name], DOB [birth year], MRN [id number]  PCP:  Crist Infante, MD  Cardiologist:   Mertie Moores, MD   Chief Complaint  Patient presents with  . Follow-up    chronic diastolic chf   1. Small NSTEMI by cardiac enzymes but minimal CAD by cath 2. LVH 3.HTN 4. Hyperlipidemia 5. Hypokalemia 6. Atrial Fibrillation  History of Present Illness:  [name] is doing fairly well. She is walking and exercising some. She notes that her heart rate is still quite variable. She has a hx of  PAF but typically does not notice if her HR is irregular. She continues to have fatigue. We tried cutting her atenolol to 25 mg daily which may have helped.   Dec. 2, 2014:  [name] is doing well. No specific cardiac issues. Occasionally has some dyspnea. She is more fatigued than she would like to be. She has had a sleep study and is being considered for CPAP.  She has had some bleeding in her stool - just occult bleeding. GI work up was negative.  She cannot tell when she is in A-fib or NSR  Dec. 3, 2015: She is seen for follow up of her atrial fib. . Fatigue seems to be better off the atenolol.   May 21, 2014:  [name] is a 82 y.o. female who presents for follow-up of her recent syncopal episode.   The  episode of pre-symcope occurred after she got out of the shower.  She had an episode of syncope that may have been due to a vasovagal reaction. She is 1 monitor the past several weeks she's not had any slow heart rates that would explain the cause of syncope.  She has had RUQ pain.  Seems to be worse after eating lunch today.  Associated with belching.  Exercising regularly.  Feeling well   May 30, 2015:  [name] is doing well.   BP was a little elevated - Dr. Joylene Draft started Lasix 20 mg a day in addition to her HCTZ 12. 5 mg a day .  BP is now better.  Has occasional chest pressure - indigestion like pain , may be related to eating  lunch .    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   She does not have any CP with her work outs.  Does have some DOE  - occasionally with housework   Records from Dr. Joylene Draft were reviewed.   Sept. 11, 2018:  [name] is seen today .  Hx of  Chronic diastolic CHF, CAD and atrial fib - on eliquis  Having some chest pressure / her husband died this past year.  Not related to exercise.  Does not do yard work. The pain is just there.   Has been sluggish  Some of her CP may be due to a GI etiology - the pain seems to sink down into the abd through the day .   Past Medical History:  Diagnosis Date  . Arthritis   . Atrial fibrillation ([place])   . Bladder polyps   . Cancer ([place])    stomach  . Cataracts, bilateral   . Cervical polyp   . Chronic kidney disease    urinary incont-  . Coronary artery disease    minimal  . Dysrhythmia     AF  hx     dr Acie Fredrickson  .  GERD (gastroesophageal reflux disease)   . Hiatal hernia   . Hyperlipidemia   . Hypertension   . Hypokalemia   . IBS (irritable bowel syndrome)   . IFG (impaired fasting glucose)   . Incontinence of urine   . MI, acute, non ST segment elevation ([facility]) [id number]   cath  . OSA (obstructive sleep apnea)    hypoxia , not CO 2 retainer,   . OSA on CPAP 06/16/2013    Sleep study 1- 11-15 with an AHI of 61 , now titrated to CPAP 9 cm water with 2 cm EPR.   . Osteopenia   . Stromal tumor of the stomach    cancer    Past Surgical History:  Procedure Laterality Date  . ANKLE FRACTURE SURGERY  2006   right, plate   . CARDIAC CATHETERIZATION  12/07/20210   Dr.Nahser,minor cad  . COLONOSCOPY    . COLONOSCOPY N/A 09-04-2014   Procedure: COLONOSCOPY;  Surgeon: Inda Castle, MD;  Location: [facility];  Service: Endoscopy;  Laterality: N/A;  . ENTEROSCOPY N/A 09-04-2014   Procedure: ENTEROSCOPY;  Surgeon: Inda Castle, MD;  Location: [facility] END[facility]COPY;  Service: Endoscopy;  Laterality: N/A;  .  EYE SURGERY     both cataracts  . KNEE ARTHROSCOPY Right 05/28/2012   Procedure: RIGHT KNEE ARTHROSCOPY PARTIAL MEDIAL AND LATERAL MENISECTOMY WITH CHONDROPLASTY;  Surgeon: Alta Corning, MD;  Location: [facility];  Service: Orthopedics;  Laterality: Right;  . stromal stomach  tumor  03/26/2007   resection  . TOTAL KNEE ARTHROPLASTY Right 08/10/2013   Procedure: TOTAL KNEE ARTHROPLASTY;  Surgeon: Alta Corning, MD;  Location: [facility];  Service: Orthopedics;  Laterality: Right;  . UPPER GI ENDOSCOPY       Current Outpatient Prescriptions  Medication Sig Dispense Refill  . amLODipine (NORVASC) 5 MG tablet Take 1 tablet (5 mg total) by mouth daily. *Please call and schedule a one year follow up appointment* 30 tablet 0  . atorvastatin (LIPITOR) 40 MG tablet Take 20 mg by mouth daily at 6 PM. 1/2 tablet by mouth daily     . calcium carbonate (TUMS - DOSED IN MG ELEMENTAL CALCIUM) 500 MG chewable tablet Chew 1 tablet by mouth 3 (three) times daily as needed for indigestion or heartburn.    . cholecalciferol (VITAMIN D) 1000 units tablet Take 1,000 Units by mouth daily.    [name] doxazosin (CARDURA) 4 MG tablet TAKE 1 TABLET BY MOUTH AT BEDTIME 30 tablet 0  . ELIQUIS 5 MG TABS tablet Take 5 mg by mouth 2 (two) times daily.  11  . esomeprazole (NEXIUM) 40 MG capsule Take 40 mg by mouth daily at 12 noon.    . furosemide (LASIX) 20 MG tablet Take 20 mg by mouth every morning.  11  . hydrochlorothiazide (MICROZIDE) 12.5 MG capsule Take 12.5 mg by mouth 2 (two) times daily.    [name] ibandronate (BONIVA) 150 MG tablet Take 150 mg by mouth every 30 (thirty) days. Take in the morning with a full glass of water, on an empty stomach, and do not take anything else by mouth or lie down for the next 30 min.    . iron polysaccharides (NIFEREX) 150 MG capsule Take 150 mg by mouth daily.    . metoprolol succinate (TOPROL-XL) 50 MG 24 hr tablet Take 50 mg by mouth daily. Take with or immediately following a meal.     . Multiple Vitamin (MULTIVITAMIN WITH MINERALS) TABS tablet Take 1  tablet by mouth daily.    . multivitamin-lutein (OCUVITE-LUTEIN) CAPS capsule Take 1 capsule by mouth daily.    . nitroGLYCERIN (NITROSTAT) 0.4 MG SL tablet Place 1 tablet (0.4 mg total) under the tongue every 5 (five) minutes as needed for chest pain. 25 tablet prn  . potassium chloride SA (K-DUR,KLOR-CON) 20 MEQ tablet Take 1 tablet (20 mEq total) by mouth 2 (two) times daily. 8 tablet 0  . Probiotic Product (ALIGN) 4 MG CAPS Take 4 mg by mouth daily.    . valsartan (DIOVAN) 160 MG tablet TAKE 1 TABLET BY MOUTH TWICE A DAY 180 tablet 3   No current facility-administered medications for this visit.     Allergies:   Detrol [tolterodine]; Tranexamic acid; Xarelto [rivaroxaban]; and Pradaxa [dabigatran etexilate mesylate]    Social History:  The patient  reports that she has never smoked. She has never used smokeless tobacco. She reports that she drinks about 1.2 oz of alcohol per week . She reports that she does not use drugs.   Family History:  The patient's family history includes Diabetes in her paternal aunt; Heart disease in her paternal grandfather; Pancreatic cancer in her paternal grandmother.    ROS:  Please see the history of present illness.    Review of Systems: Constitutional:  denies fever, chills, diaphoresis, appetite change and fatigue.  HEENT: denies photophobia, eye pain, redness, hearing loss, ear pain, congestion, sore throat, rhinorrhea, sneezing, neck pain, neck stiffness and tinnitus.  Respiratory: denies SOB, DOE, cough, chest tightness, and wheezing.  Cardiovascular: denies chest pain, palpitations and leg swelling.  Gastrointestinal: denies nausea, vomiting, abdominal pain, diarrhea, constipation, blood in stool.  Genitourinary: denies dysuria, urgency, frequency, hematuria, flank pain and difficulty urinating.  Musculoskeletal: denies  myalgias, back pain, joint swelling, arthralgias and gait  problem.   Skin: denies pallor, rash and wound.  Neurological: denies dizziness, seizures, syncope, weakness, light-headedness, numbness and headaches.   Hematological: denies adenopathy, easy bruising, personal or family bleeding history.  Psychiatric/ Behavioral: denies suicidal ideation, mood changes, confusion, nervousness, sleep disturbance and agitation.       All other systems are reviewed and negative.    PHYSICAL EXAM: VS:  BP 138/70   Pulse 90   Ht 5' 4.5" (1.638 m)   Wt 142 lb 6.4 oz (64.6 kg)   BMI 24.07 kg/m  , BMI Body mass index is 24.07 kg/m. GEN: Well nourished, well developed, in no acute distress  HEENT: normal  Neck: no JVD, carotid bruits, or masses Cardiac: Irreg. Irreg. ; no murmurs, rubs, or gallops,no edema  Respiratory:  clear to auscultation bilaterally, normal work of breathing GI: soft, nontender, nondistended, + BS MS: no deformity or atrophy  Skin: warm and dry, no rash Neuro:  Strength and sensation are intact Psych: normal   EKG:  EKG is not ordered today.    Recent Labs: 04/27/2016: BUN 27; Creatinine, Ser 0.98; Hemoglobin 13.3; Platelets 143; Potassium 3.0; Sodium 137    Lipid Panel    Component Value Date/Time   CHOL 123 10/30/2011 0928   TRIG 38.0 10/30/2011 0928   HDL 56.60 10/30/2011 0928   CHOLHDL 2 10/30/2011 0928   VLDL 7.6 10/30/2011 0928   LDLCALC 59 10/30/2011 0928      Wt Readings from Last 3 Encounters:  11/06/16 142 lb 6.4 oz (64.6 kg)  05/30/15 165 lb (74.8 kg)  03/08/15 161 lb 6.4 oz (73.2 kg)      Other studies Reviewed: Additional studies/ records that were  reviewed today include: . Review of the above records demonstrates:    ASSESSMENT AND PLAN:  1. Small NSTEMI by cardiac enzymes but minimal CAD by cath on Dec. 7, 2010. She has had some atypical CP . Likely due to indigestion .  She will call us back if the pain worsens. Will reassess in 6 months   2. LVH 3.HTN -  BP is well controlled.  4.  Hyperlipidemia - managed by Dr. Joylene Draft  5. Hypokalemia 6. Atrial Fibrillation -  Chronic  7. Pre-syncope :      Current medicines are reviewed at length with the patient today.  The patient does not have concerns regarding medicines.  The following changes have been made:  no change  Labs/ tests ordered today include:  No orders of the defined types were placed in this encounter.    Disposition:   FU with me in 6 months   Signed, Mertie Moores, MD  11/06/2016 11:26 AM    [facility] [address], [place], [place]  [address] Phone: [phone]; Fax: [phone]

## 2016-11-06 NOTE — Patient Instructions (Signed)
Medication Instructions:  Your physician recommends that you continue on your current medications as directed. Please refer to the Current Medication list given to you today.   Labwork: None Ordered   Testing/Procedures: None Ordered   Follow-Up: Your physician wants you to follow-up in: 6 months with Dr. Nahser.  You will receive a reminder letter in the mail two months in advance. If you don't receive a letter, please call our office to schedule the follow-up appointment.   If you need a refill on your cardiac medications before your next appointment, please call your pharmacy.   Thank you for choosing [facility]! Toniette Devera, RN [phone]    

## 2016-11-19 ENCOUNTER — Other Ambulatory Visit: Payer: Self-pay | Admitting: Internal Medicine

## 2016-11-19 ENCOUNTER — Other Ambulatory Visit: Payer: Self-pay | Admitting: Cardiovascular Disease

## 2016-11-19 DIAGNOSIS — M5416 Radiculopathy, lumbar region: Secondary | ICD-10-CM

## 2016-11-30 ENCOUNTER — Other Ambulatory Visit: Payer: Self-pay | Admitting: Internal Medicine

## 2016-11-30 ENCOUNTER — Ambulatory Visit
Admission: RE | Admit: 2016-11-30 | Discharge: 2016-11-30 | Disposition: A | Discharge status: Home / Self Care | Payer: Medicare Other | Source: Ambulatory Visit | Attending: Internal Medicine | Admitting: Internal Medicine

## 2016-11-30 DIAGNOSIS — M5416 Radiculopathy, lumbar region: Secondary | ICD-10-CM

## 2016-11-30 MED ORDER — IOPAMIDOL (ISOVUE-M 200) INJECTION 41%
1.0000 mL | Freq: Once | INTRAMUSCULAR | Status: AC
  Administered 2016-11-30: 1 mL via EPIDURAL

## 2016-11-30 MED ORDER — METHYLPREDNISOLONE ACETATE 40 MG/ML INJ SUSP (RADIOLOG
120.0000 mg | Freq: Once | INTRAMUSCULAR | Status: AC
  Administered 2016-11-30: 120 mg via EPIDURAL

## 2016-11-30 NOTE — Discharge Instructions (Signed)
Spinal Injection Discharge Instruction Sheet ° °1. You may resume a regular diet and any medications that you routinely take, including pain medications. ° °2. No driving the rest of the day of the procedure. ° °3. Light activity throughout the rest of the day.  Do not do any strenuous work, exercise, bending or lifting.  The day following the procedure, you may resume normal physical activity but you should refrain from exercising or physical therapy for at least three days. ° ° °Common Side Effects: ° °· Headaches- take your usual medications as directed by your physician.   ° °· Restlessness or inability to sleep- you may have trouble sleeping for the next few days.  Ask your referring physician if you need any medication for sleep if over the counter sleep medications do not help. ° °· Facial flushing or redness- this should subside within a few days. ° °· Increased pain- a temporary increase in pain a day or two following your procedure is not unusual.  Take your pain medication as prescribed by your referring physician.  You may use ice to the injection site as needed.  Please do not use heat for 24 hours. ° °· Leg cramps ° °Please contact our office at [phone] for the following symptoms: °· Fever greater than 100 degrees. °· Headaches unresolved with medication after 2-3 days. °· Increased swelling, pain, or redness at injection site. ° °Thank you for visiting our office. ° ° °You may resume Eliquis today. °

## 2016-12-13 ENCOUNTER — Telehealth: Payer: Self-pay | Admitting: Cardiology

## 2016-12-13 NOTE — Telephone Encounter (Signed)
Received incoming records from [facility] for upcoming appointment on 12/17/16 @ 11am with Kerin Ransom. Records given to [facility] in Medical Records. 12/13/16 ab

## 2016-12-14 ENCOUNTER — Telehealth: Payer: Self-pay | Admitting: Cardiology

## 2016-12-14 NOTE — Telephone Encounter (Signed)
Closed Encounter  °

## 2016-12-17 ENCOUNTER — Ambulatory Visit (INDEPENDENT_AMBULATORY_CARE_PROVIDER_SITE_OTHER): Payer: Medicare Other | Admitting: Cardiology

## 2016-12-17 ENCOUNTER — Encounter: Payer: Self-pay | Admitting: Cardiology

## 2016-12-17 DIAGNOSIS — R0789 Other chest pain: Secondary | ICD-10-CM

## 2016-12-17 DIAGNOSIS — Z7901 Long term (current) use of anticoagulants: Secondary | ICD-10-CM | POA: Diagnosis not present

## 2016-12-17 DIAGNOSIS — Z9989 Dependence on other enabling machines and devices: Secondary | ICD-10-CM | POA: Diagnosis not present

## 2016-12-17 DIAGNOSIS — I482 Chronic atrial fibrillation, unspecified: Secondary | ICD-10-CM

## 2016-12-17 DIAGNOSIS — I2510 Atherosclerotic heart disease of native coronary artery without angina pectoris: Secondary | ICD-10-CM

## 2016-12-17 DIAGNOSIS — I10 Essential (primary) hypertension: Secondary | ICD-10-CM

## 2016-12-17 DIAGNOSIS — G4733 Obstructive sleep apnea (adult) (pediatric): Secondary | ICD-10-CM

## 2016-12-17 DIAGNOSIS — Z8719 Personal history of other diseases of the digestive system: Secondary | ICD-10-CM

## 2016-12-17 DIAGNOSIS — R079 Chest pain, unspecified: Secondary | ICD-10-CM | POA: Diagnosis not present

## 2016-12-17 DIAGNOSIS — I252 Old myocardial infarction: Secondary | ICD-10-CM | POA: Diagnosis not present

## 2016-12-17 MED ORDER — NITROGLYCERIN 0.4 MG SL SUBL: 0.4000 mg | SUBLINGUAL_TABLET | SUBLINGUAL | 99 refills | Status: AC | PRN

## 2016-12-17 NOTE — Progress Notes (Signed)
12/17/2016 [name]   1935/01/29  [id number]  Primary Physician Crist Infante, MD Primary Cardiologist: Dr Acie Fredrickson  HPI:  82 y/o female, lives alone, her husband of 69 yrs died in Apr 21, 2022. She has a son who lives on the [place] but no other family in the area. She is still living in her 4 bedroom house. The pt has a history of remote NSTEMI per records. Cath report from 2010 showed minimal CAD with 10-20% in the LAD and normal LVF. Echo in 2017 showed normal LVF, MAC, moderate LAE and mild LVH. The pt has CAF and is on Eliquis. She was referred by Dr Joylene Draft after she noted a history of SSCP-hard for her to describe but she thinks gastrointestinal, though she has taken Pepto bismol without relief. The pt says her chest pain is not exertional, not worse by position, not associated with radiation to her arms or jaw, and not worsened by exertion. She gives a Clovis Riley sign when describing her symptoms. She has multiple risk factors for CAD and Dr Joylene Draft felt she warranted further evaluation.    Current Outpatient Prescriptions  Medication Sig Dispense Refill  . amLODipine (NORVASC) 5 MG tablet Take 1 tablet (5 mg total) by mouth daily. *Please call and schedule a one year follow up appointment* 30 tablet 0  . atorvastatin (LIPITOR) 40 MG tablet Take 20 mg by mouth daily at 6 PM. 1/2 tablet by mouth daily     . calcium carbonate (TUMS - DOSED IN MG ELEMENTAL CALCIUM) 500 MG chewable tablet Chew 1 tablet by mouth 3 (three) times daily as needed for indigestion or heartburn.    . cholecalciferol (VITAMIN D) 1000 units tablet Take 1,000 Units by mouth daily.    [name] doxazosin (CARDURA) 4 MG tablet TAKE 1 TABLET AT BEDTIME 30 tablet 11  . ELIQUIS 5 MG TABS tablet Take 5 mg by mouth 2 (two) times daily.  11  . esomeprazole (NEXIUM) 40 MG capsule Take 40 mg by mouth daily at 12 noon.    . furosemide (LASIX) 20 MG tablet Take 20 mg by mouth every morning.  11  . hydrochlorothiazide (MICROZIDE) 12.5 MG  capsule Take 12.5 mg by mouth 2 (two) times daily.    [name] ibandronate (BONIVA) 150 MG tablet Take 150 mg by mouth every 30 (thirty) days. Take in the morning with a full glass of water, on an empty stomach, and do not take anything else by mouth or lie down for the next 30 min.    . iron polysaccharides (NIFEREX) 150 MG capsule Take 150 mg by mouth daily.    . metoprolol succinate (TOPROL-XL) 50 MG 24 hr tablet Take 50 mg by mouth daily. Take with or immediately following a meal.    . Multiple Vitamin (MULTIVITAMIN WITH MINERALS) TABS tablet Take 1 tablet by mouth daily.    . multivitamin-lutein (OCUVITE-LUTEIN) CAPS capsule Take 1 capsule by mouth daily.    . nitroGLYCERIN (NITROSTAT) 0.4 MG SL tablet Place 1 tablet (0.4 mg total) under the tongue every 5 (five) minutes as needed for chest pain. 25 tablet prn  . potassium chloride SA (K-DUR,KLOR-CON) 20 MEQ tablet Take 1 tablet (20 mEq total) by mouth 2 (two) times daily. 8 tablet 0  . Probiotic Product (ALIGN) 4 MG CAPS Take 4 mg by mouth daily.    . valsartan (DIOVAN) 160 MG tablet TAKE 1 TABLET BY MOUTH TWICE A DAY 180 tablet 3   No current facility-administered medications for  this visit.     Allergies  Allergen Reactions  . Detrol [Tolterodine] Other (See Comments)    Critical reaction-per MD office  . Tranexamic Acid Other (See Comments)    Patient ineligible for Tranexamic acid due to hx of NSTEMI and CAD.  [name] Xarelto [Rivaroxaban] Other (See Comments)    Critical reaction  . Pradaxa [Dabigatran Etexilate Mesylate] Nausea And Vomiting and Other (See Comments)    Moderate reaction-per MD    Past Medical History:  Diagnosis Date  . Arthritis   . Atrial fibrillation ([place])   . Bladder polyps   . Cancer ([place])    stomach  . Cataracts, bilateral   . Cervical polyp   . Chronic kidney disease    urinary incont-  . Coronary artery disease    minimal  . Dysrhythmia     AF  hx     dr Acie Fredrickson  . GERD (gastroesophageal reflux disease)    . Hiatal hernia   . Hyperlipidemia   . Hypertension   . Hypokalemia   . IBS (irritable bowel syndrome)   . IFG (impaired fasting glucose)   . Incontinence of urine   . MI, acute, non ST segment elevation ([place]) AQT.6,2263   cath  . OSA (obstructive sleep apnea)    hypoxia , not CO 2 retainer,   . OSA on CPAP 06/16/2013    Sleep study 1- 11-15 with an AHI of 61 , now titrated to CPAP 9 cm water with 2 cm EPR.   . Osteopenia   . Stromal tumor of the stomach    cancer    Social History   Social History  . Marital status: Married    Spouse name: Joneen Boers  . Number of children: 2  . Years of education: Masters   Occupational History  . retired Retired   Social History Main Topics  . Smoking status: Never Smoker  . Smokeless tobacco: Never Used  . Alcohol use 1.2 oz/week    2 Glasses of wine per week     Comment: one per week  . Drug use: No  . Sexual activity: No   Other Topics Concern  . Not on file   Social History Narrative   Patient is married Joneen Boers) and lives at home with her husband.   Patient is retired.   Patient has a Oceanographer in Education   Patient is right-handed.   Patient does not drink any caffeine.   Patient has one living child and one child is deceased.     Family History  Problem Relation Age of Onset  . Pancreatic cancer Paternal Grandmother   . Heart disease Paternal Grandfather        both sides of the family  . Diabetes Paternal Aunt      Review of Systems: General: negative for chills, fever, night sweats or weight changes.  Cardiovascular: negative for chest pain, dyspnea on exertion, edema, orthopnea, palpitations, paroxysmal nocturnal dyspnea or shortness of breath Dermatological: negative for rash Respiratory: negative for cough or wheezing Urologic: negative for hematuria Abdominal: negative for nausea, vomiting, diarrhea, bright red blood per rectum, melena, or hematemesis Neurologic: negative for visual changes, syncope, or  dizziness Chronic sciatica All other systems reviewed and are otherwise negative except as noted above.    Blood pressure (!) 143/85, pulse (!) 127, height 5' 4.5" (1.638 m), weight 138 lb (62.6 kg), SpO2 97 %.  General appearance: alert, cooperative and no distress Neck: no carotid bruit and no JVD Lungs:  clear to auscultation bilaterally Heart: irregularly irregular rhythm Abdomen: soft, non-tender; bowel sounds normal; no masses,  no organomegaly Extremities: extremities normal, atraumatic, no cyanosis or edema Pulses: 2+ and symmetric Skin: Skin color, texture, turgor normal. No rashes or lesions Neurologic: Grossly normal  EKG AF without acute changes  ASSESSMENT AND PLAN:   Chest pain with moderate risk for cardiac etiology Pt referred by Dr Joylene Draft for complaints of chest pain.  History of non-ST elevation myocardial infarction (NSTEMI) Pt reportedly had a remote NSTEMI by enzymes with minor CAD at cath  OSA on CPAP  Sleep study 1- 11-15  Chronic atrial fibrillation (HCC) Rate controlled  Essential hypertension Controlled  Hypertension Controlled  Coronary artery disease Minimal CAD at cath 2010- 10-20% LAD, normal LVF  Chronic anticoagulation CHADs VASC=4, on Eliquis   PLAN  I made sure she had an updated Rx for NTG and will arrange for an OP Lexiscan.   Kerin Ransom PA-C 12/17/2016 4:06 PM

## 2016-12-17 NOTE — Assessment & Plan Note (Signed)
Minimal CAD at cath 2010- 10-20% LAD, normal LVF

## 2016-12-17 NOTE — Assessment & Plan Note (Signed)
Pt referred by Dr Joylene Draft for complaints of chest pain.

## 2016-12-17 NOTE — Assessment & Plan Note (Signed)
CHADs VASC=4, on Eliquis

## 2016-12-17 NOTE — Assessment & Plan Note (Signed)
Controlled.  

## 2016-12-17 NOTE — Assessment & Plan Note (Signed)
Rate controlled 

## 2016-12-17 NOTE — Patient Instructions (Signed)
Medication Instructions: Your physician recommends that you continue on your current medications as directed. Please refer to the Current Medication list given to you today.   Testing/Procedures: Your physician has requested that you have a lexiscan myoview. For further information please visit [email or web address]. Please follow instruction sheet, as given.  Follow-Up: Your physician recommends that you schedule a follow-up appointment with Dr. Acie Fredrickson after testing.  If you need a refill on your cardiac medications before your next appointment, please call your pharmacy.

## 2016-12-17 NOTE — Assessment & Plan Note (Signed)
Sleep study 1- 11-15

## 2016-12-17 NOTE — Assessment & Plan Note (Signed)
Pt reportedly had a remote NSTEMI by enzymes with minor CAD at cath

## 2016-12-24 ENCOUNTER — Other Ambulatory Visit: Payer: Self-pay | Admitting: Internal Medicine

## 2016-12-24 DIAGNOSIS — R413 Other amnesia: Secondary | ICD-10-CM

## 2017-01-03 ENCOUNTER — Telehealth (HOSPITAL_COMMUNITY): Payer: Self-pay

## 2017-01-03 NOTE — Telephone Encounter (Signed)
Encounter complete. 

## 2017-01-04 ENCOUNTER — Telehealth (HOSPITAL_COMMUNITY): Payer: Self-pay

## 2017-01-04 NOTE — Telephone Encounter (Signed)
Encounter complete. 

## 2017-01-08 ENCOUNTER — Ambulatory Visit (HOSPITAL_COMMUNITY)
Admission: RE | Admit: 2017-01-08 | Discharge: 2017-01-08 | Disposition: A | Discharge status: Home / Self Care | Payer: Medicare Other | Source: Ambulatory Visit | Attending: Cardiology | Admitting: Cardiology

## 2017-01-08 DIAGNOSIS — R0789 Other chest pain: Secondary | ICD-10-CM | POA: Insufficient documentation

## 2017-01-08 LAB — MYOCARDIAL PERFUSION IMAGING
LV dias vol: 69 mL (ref 46–106)
LV sys vol: 24 mL
Peak HR: 104 {beats}/min
Rest HR: 83 {beats}/min
SDS: 2
SRS: 0
SSS: 2
TID: 1.02

## 2017-01-08 MED ORDER — REGADENOSON 0.4 MG/5ML IV SOLN
0.4000 mg | Freq: Once | INTRAVENOUS | Status: AC
  Administered 2017-01-08: 0.4 mg via INTRAVENOUS

## 2017-01-08 MED ORDER — TECHNETIUM TC 99M TETROFOSMIN IV KIT
8.0000 | PACK | Freq: Once | INTRAVENOUS | Status: AC | PRN
  Administered 2017-01-08: 8 via INTRAVENOUS
  Filled 2017-01-08: qty 8

## 2017-01-08 MED ORDER — TECHNETIUM TC 99M TETROFOSMIN IV KIT
24.6000 | PACK | Freq: Once | INTRAVENOUS | Status: AC | PRN
  Administered 2017-01-08: 24.6 via INTRAVENOUS
  Filled 2017-01-08: qty 25

## 2017-01-11 ENCOUNTER — Encounter: Payer: Self-pay | Admitting: Cardiovascular Disease

## 2017-01-11 ENCOUNTER — Ambulatory Visit: Payer: Medicare Other | Admitting: Cardiovascular Disease

## 2017-01-11 VITALS — BP 118/62 | HR 102 | Ht 64.5 in | Wt 134.1 lb

## 2017-01-11 DIAGNOSIS — I10 Essential (primary) hypertension: Secondary | ICD-10-CM

## 2017-01-11 DIAGNOSIS — I482 Chronic atrial fibrillation, unspecified: Secondary | ICD-10-CM

## 2017-01-11 MED ORDER — METOPROLOL SUCCINATE ER 100 MG PO TB24: 100.0000 mg | ORAL_TABLET | Freq: Every day | ORAL | 6 refills | Status: AC

## 2017-01-11 NOTE — Patient Instructions (Addendum)
Medication Instructions:  Your physician recommends that you continue on your current medications as directed. Please refer to the Current Medication list given to you today.  Labwork: None  Testing/Procedures: None  Follow-Up: Your physician wants you to follow-up in: 6 months with Dr. Nahser.  You will receive a reminder letter in the mail two months in advance. If you don't receive a letter, please call our office to schedule the follow-up appointment.   Any Other Special Instructions Will Be Listed Below (If Applicable).     If you need a refill on your cardiac medications before your next appointment, please call your pharmacy.   

## 2017-01-11 NOTE — Progress Notes (Signed)
Cardiology Office Note   Date:  01/11/2017   ID:  [name], DOB Jul 14, 1934, MRN [id number]  PCP:  Crist Infante, MD  Cardiologist:   Mertie Moores, MD   Chief Complaint  Patient presents with  . Atrial Fibrillation   1. Small NSTEMI by cardiac enzymes but minimal CAD by cath 2. LVH 3.HTN 4. Hyperlipidemia 5. Hypokalemia 6. Atrial Fibrillation  Previous history   [name] is doing fairly well. She is walking and exercising some. She notes that her heart rate is still quite variable. She has a hx of  PAF but typically does not notice if her HR is irregular. She continues to have fatigue. We tried cutting her atenolol to 25 mg daily which may have helped.   Dec. 2, 2014:  [name] is doing well. No specific cardiac issues. Occasionally has some dyspnea. She is more fatigued than she would like to be. She has had a sleep study and is being considered for CPAP.  She has had some bleeding in her stool - just occult bleeding. GI work up was negative.  She cannot tell when she is in A-fib or NSR  Dec. 3, 2015: She is seen for follow up of her atrial fib. . Fatigue seems to be better off the atenolol.   May 21, 2014:  [name] is a 82 y.o. female who presents for follow-up of her recent syncopal episode.   The  episode of pre-symcope occurred after she got out of the shower.  She had an episode of syncope that may have been due to a vasovagal reaction. She is 1 monitor the past several weeks she's not had any slow heart rates that would explain the cause of syncope.  She has had RUQ pain.  Seems to be worse after eating lunch today.  Associated with belching.  Exercising regularly.  Feeling well   May 30, 2015:  [name] is doing well.   BP was a little elevated - Dr. Joylene Draft started Lasix 20 mg a day in addition to her HCTZ 12. 5 mg a day .  BP is now better.  Has occasional chest pressure - indigestion like pain , may be related to eating lunch .    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   She does not have any CP with her work outs.  Does have some DOE  - occasionally with housework   Records from Dr. Joylene Draft were reviewed.   Sept. 11, 2018:  [name] is seen today .  Hx of  Chronic diastolic CHF, CAD and atrial fib - on eliquis  Having some chest pressure / her husband died this past year.  Not related to exercise.  Does not do yard work. The pain is just there.   Has been sluggish  Some of her CP may be due to a GI etiology - the pain seems to sink down into the abd through the day .   January 11, 2017:  And was recently seen for some episodes of chest discomfort.   The chest pain was in the lower aspect of her chest and moved down into her abdomen.     Husband Joneen Boers died in 04/16/22. .  A stress Myoview study was negative for ischemia.  She has normal left ventricular systolic function with an ejection fraction of 65%. Overall feeling well.    When reconciling her medications, it was clear that she  did not have any idea what she was on.  Our records show that she was on both Lasix and HCTZ and a low-dose metoprolol. In fact, she was on HCTZ, no Lasix, and metoprolol 100 mg a day.  Past Medical History:  Diagnosis Date  . Arthritis   . Atrial fibrillation ([place])   . Bladder polyps   . Cancer ([place])    stomach  . Cataracts, bilateral   . Cervical polyp   . Chronic kidney disease    urinary incont-  . Coronary artery disease    minimal  . Dysrhythmia     AF  hx     dr Acie Fredrickson  . GERD (gastroesophageal reflux disease)   . Hiatal hernia   . Hyperlipidemia   . Hypertension   . Hypokalemia   . IBS (irritable bowel syndrome)   . IFG (impaired fasting glucose)   . Incontinence of urine   . MI, acute, non ST segment elevation ([place]) DVV.6,1607   cath  . OSA (obstructive sleep apnea)    hypoxia , not CO 2 retainer,   . OSA on CPAP 06/16/2013    Sleep study 1- 11-15 with an AHI of 61 , now titrated to  CPAP 9 cm water with 2 cm EPR.   . Osteopenia   . Stromal tumor of the stomach    cancer    Past Surgical History:  Procedure Laterality Date  . ANKLE FRACTURE SURGERY  2006   right, plate   . CARDIAC CATHETERIZATION  12/07/20210   Dr.Sharalyn Lomba,minor cad  . COLONOSCOPY    . COLONOSCOPY N/A 2020-02-1014   Procedure: COLONOSCOPY;  Surgeon: Inda Castle, MD;  Location: [facility];  Service: Endoscopy;  Laterality: N/A;  . ENTEROSCOPY N/A 2020-02-1014   Procedure: ENTEROSCOPY;  Surgeon: Inda Castle, MD;  Location: [facility] ENDOSCOPY;  Service: Endoscopy;  Laterality: N/A;  . EYE SURGERY     both cataracts  . KNEE ARTHROSCOPY Right 05/28/2012   Procedure: RIGHT KNEE ARTHROSCOPY PARTIAL MEDIAL AND LATERAL MENISECTOMY WITH CHONDROPLASTY;  Surgeon: Alta Corning, MD;  Location: [facility];  Service: Orthopedics;  Laterality: Right;  . stromal stomach  tumor  03/26/2007   resection  . TOTAL KNEE ARTHROPLASTY Right 08/10/2013   Procedure: TOTAL KNEE ARTHROPLASTY;  Surgeon: Alta Corning, MD;  Location: [facility];  Service: Orthopedics;  Laterality: Right;  . UPPER GI ENDOSCOPY       Current Outpatient Medications  Medication Sig Dispense Refill  . amLODipine (NORVASC) 5 MG tablet Take 1 tablet (5 mg total) by mouth daily. *Please call and schedule a one year follow up appointment* 30 tablet 0  . atorvastatin (LIPITOR) 40 MG tablet Take 20 mg by mouth daily at 6 PM. 1/2 tablet by mouth daily     . calcium carbonate (TUMS - DOSED IN MG ELEMENTAL CALCIUM) 500 MG chewable tablet Chew 1 tablet by mouth 3 (three) times daily as needed for indigestion or heartburn.    . cholecalciferol (VITAMIN D) 1000 units tablet Take 1,000 Units by mouth daily.    [name] doxazosin (CARDURA) 4 MG tablet TAKE 1 TABLET AT BEDTIME 30 tablet 11  . ELIQUIS 5 MG TABS tablet Take 5 mg by mouth 2 (two) times daily.  11  . esomeprazole (NEXIUM) 40 MG capsule Take 40 mg by mouth daily at 12 noon.    . furosemide (LASIX) 20  MG tablet Take 20 mg by mouth every morning.  11  . hydrochlorothiazide (MICROZIDE)  12.5 MG capsule Take 12.5 mg by mouth 2 (two) times daily.    [name] ibandronate (BONIVA) 150 MG tablet Take 150 mg by mouth every 30 (thirty) days. Take in the morning with a full glass of water, on an empty stomach, and do not take anything else by mouth or lie down for the next 30 min.    . iron polysaccharides (NIFEREX) 150 MG capsule Take 150 mg by mouth daily.    . metoprolol succinate (TOPROL-XL) 50 MG 24 hr tablet Take 50 mg by mouth daily. Take with or immediately following a meal.    . Multiple Vitamin (MULTIVITAMIN WITH MINERALS) TABS tablet Take 1 tablet by mouth daily.    . multivitamin-lutein (OCUVITE-LUTEIN) CAPS capsule Take 1 capsule by mouth daily.    . nitroGLYCERIN (NITROSTAT) 0.4 MG SL tablet Place 1 tablet (0.4 mg total) under the tongue every 5 (five) minutes as needed for chest pain. 25 tablet prn  . potassium chloride SA (K-DUR,KLOR-CON) 20 MEQ tablet Take 1 tablet (20 mEq total) by mouth 2 (two) times daily. 8 tablet 0  . Probiotic Product (ALIGN) 4 MG CAPS Take 4 mg by mouth daily.    . valsartan (DIOVAN) 160 MG tablet TAKE 1 TABLET BY MOUTH TWICE A DAY 180 tablet 3   No current facility-administered medications for this visit.     Allergies:   Detrol [tolterodine]; Tranexamic acid; Xarelto [rivaroxaban]; and Pradaxa [dabigatran etexilate mesylate]    Social History:  The patient  reports that  has never smoked. she has never used smokeless tobacco. She reports that she drinks about 1.2 oz of alcohol per week. She reports that she does not use drugs.   Family History:  The patient's family history includes Diabetes in her paternal aunt; Heart disease in her paternal grandfather; Pancreatic cancer in her paternal grandmother.    ROS:  Please see the history of present illness.   As noted in the current history.  All other systems are negative.[name]   Physical Exam: Blood pressure 118/62,  pulse (!) 102, height 5' 4.5" (1.638 m), weight 134 lb 1.9 oz (60.8 kg), SpO2 97 %.  GEN:  Well nourished, well developed in no acute distress HEENT: Normal NECK: No JVD; No carotid bruits LYMPHATICS: No lymphadenopathy CARDIAC: irreg. Irreg. ,  Soft systolic murmur , rubs, gallops RESPIRATORY:  Clear to auscultation without rales, wheezing or rhonchi  ABDOMEN: Soft, non-tender, non-distended MUSCULOSKELETAL:  No edema; No deformity  SKIN: Warm and dry NEUROLOGIC:  Alert and oriented x 3   EKG:      Recent Labs: 04/27/2016: BUN 27; Creatinine, Ser 0.98; Hemoglobin 13.3; Platelets 143; Potassium 3.0; Sodium 137    Lipid Panel    Component Value Date/Time   CHOL 123 10/30/2011 0928   TRIG 38.0 10/30/2011 0928   HDL 56.60 10/30/2011 0928   CHOLHDL 2 10/30/2011 0928   VLDL 7.6 10/30/2011 0928   LDLCALC 59 10/30/2011 0928      Wt Readings from Last 3 Encounters:  01/11/17 134 lb 1.9 oz (60.8 kg)  01/08/17 138 lb (62.6 kg)  12/17/16 138 lb (62.6 kg)      Other studies Reviewed: Additional studies/ records that were reviewed today include: . Review of the above records demonstrates:    ASSESSMENT AND PLAN:  1. Small NSTEMI by cardiac enzymes but minimal CAD by cath on Dec. 7, 2010.   2. LVH 3.HTN -her blood pressure is fairly well controlled.  We will continue current medications.[name]  4. Hyperlipidemia - managed by Dr. Joylene Draft  5. Hypokalemia 6. Atrial Fibrillation -she has chronic atrial fibrillation.  Her heart rate is overall fairly well controlled.  Continue current dose of metoprolol.    Continue Eliquis. 7. Pre-syncope :     8.  Dementia : It is becoming increasingly clear that she is having memory issues.  She was not able to recall any of her medications or the doses.  Even when we had the medications in front of her, she was having difficulty in telling whether or not she was actually taking them.  We have given her an updated list based on the medications  that she has.  Her heart rate and blood pressure seem to be fairly well controlled.  I have instructed her to bring her medications to each doctor's office that she goes to.  Current medicines are reviewed at length with the patient today.  The patient does not have concerns regarding medicines.  The following changes have been made:  no change  Labs/ tests ordered today include:  No orders of the defined types were placed in this encounter.    Disposition:   FU with me in 6 months   Signed, Mertie Moores, MD  01/11/2017 9:42 AM    [facility] [address], [place], [place]  [address] Phone: [phone]; Fax: [phone]

## 2017-01-29 ENCOUNTER — Other Ambulatory Visit: Payer: Self-pay | Admitting: Internal Medicine

## 2017-01-29 DIAGNOSIS — M5416 Radiculopathy, lumbar region: Secondary | ICD-10-CM

## 2017-02-07 ENCOUNTER — Ambulatory Visit
Admission: RE | Admit: 2017-02-07 | Discharge: 2017-02-07 | Disposition: A | Discharge status: Home / Self Care | Payer: Medicare Other | Source: Ambulatory Visit | Attending: Internal Medicine | Admitting: Internal Medicine

## 2017-02-07 DIAGNOSIS — M5416 Radiculopathy, lumbar region: Secondary | ICD-10-CM

## 2017-02-07 MED ORDER — IOPAMIDOL (ISOVUE-M 200) INJECTION 41%
1.0000 mL | Freq: Once | INTRAMUSCULAR | Status: AC
  Administered 2017-02-07: 1 mL via EPIDURAL

## 2017-02-07 MED ORDER — METHYLPREDNISOLONE ACETATE 40 MG/ML INJ SUSP (RADIOLOG
120.0000 mg | Freq: Once | INTRAMUSCULAR | Status: AC
  Administered 2017-02-07: 120 mg via EPIDURAL

## 2017-02-07 NOTE — Discharge Instructions (Signed)

## 2017-03-18 ENCOUNTER — Encounter: Payer: Self-pay | Admitting: Cardiovascular Disease

## 2017-03-28 ENCOUNTER — Encounter: Payer: Self-pay | Admitting: Cardiovascular Disease

## 2017-05-22 ENCOUNTER — Ambulatory Visit (INDEPENDENT_AMBULATORY_CARE_PROVIDER_SITE_OTHER): Payer: Medicare Other | Admitting: Physical Medicine and Rehabilitation

## 2017-05-22 ENCOUNTER — Encounter (INDEPENDENT_AMBULATORY_CARE_PROVIDER_SITE_OTHER): Payer: Self-pay | Admitting: Physical Medicine and Rehabilitation

## 2017-05-22 VITALS — BP 143/76 | HR 82 | Temp 97.8°F

## 2017-05-22 DIAGNOSIS — M4316 Spondylolisthesis, lumbar region: Secondary | ICD-10-CM | POA: Diagnosis not present

## 2017-05-22 DIAGNOSIS — M48061 Spinal stenosis, lumbar region without neurogenic claudication: Secondary | ICD-10-CM | POA: Diagnosis not present

## 2017-05-22 DIAGNOSIS — M47816 Spondylosis without myelopathy or radiculopathy, lumbar region: Secondary | ICD-10-CM

## 2017-05-22 NOTE — Progress Notes (Signed)
 .  Numeric Pain Rating Scale and Functional Assessment Average Pain 8 Pain Right Now 3 My pain is constant, dull and aching Pain is worse with: walking and some activites Pain improves with: rest   In the last MONTH (on 0-10 scale) has pain interfered with the following?  1. General activity like being  able to carry out your everyday physical activities such as walking, climbing stairs, carrying groceries, or moving a chair?  Rating(6)  2. Relation with others like being able to carry out your usual social activities and roles such as  activities at home, at work and in your community. Rating(4)  3. Enjoyment of life such that you have  been bothered by emotional problems such as feeling anxious, depressed or irritable?  Rating(1)

## 2017-05-29 ENCOUNTER — Ambulatory Visit (INDEPENDENT_AMBULATORY_CARE_PROVIDER_SITE_OTHER): Payer: Medicare Other | Admitting: Physical Medicine and Rehabilitation

## 2017-05-29 ENCOUNTER — Encounter (INDEPENDENT_AMBULATORY_CARE_PROVIDER_SITE_OTHER): Payer: Self-pay | Admitting: Physical Medicine and Rehabilitation

## 2017-05-29 ENCOUNTER — Ambulatory Visit (INDEPENDENT_AMBULATORY_CARE_PROVIDER_SITE_OTHER): Payer: Medicare Other

## 2017-05-29 VITALS — BP 137/98 | HR 68 | Temp 97.6°F

## 2017-05-29 DIAGNOSIS — M4316 Spondylolisthesis, lumbar region: Secondary | ICD-10-CM | POA: Diagnosis not present

## 2017-05-29 DIAGNOSIS — M47816 Spondylosis without myelopathy or radiculopathy, lumbar region: Secondary | ICD-10-CM

## 2017-05-29 MED ORDER — METHYLPREDNISOLONE ACETATE 80 MG/ML IJ SUSP
80.0000 mg | Freq: Once | INTRAMUSCULAR | Status: AC
  Administered 2017-05-29: 80 mg

## 2017-05-29 NOTE — Progress Notes (Signed)
 .  Numeric Pain Rating Scale and Functional Assessment Average Pain 8   In the last MONTH (on 0-10 scale) has pain interfered with the following?  1. General activity like being  able to carry out your everyday physical activities such as walking, climbing stairs, carrying groceries, or moving a chair?  Rating(6)   +Driver, -BT, -Dye Allergies.  

## 2017-05-29 NOTE — Patient Instructions (Signed)

## 2017-05-30 ENCOUNTER — Encounter (INDEPENDENT_AMBULATORY_CARE_PROVIDER_SITE_OTHER): Payer: Self-pay | Admitting: Physical Medicine and Rehabilitation

## 2017-05-30 NOTE — Progress Notes (Signed)
[name] - 83 y.o. female MRN [id number]  Date of birth: 07-08-1934  Office Visit Note: Visit Date: 05/22/2017 PCP: Crist Infante, MD Referred by: Crist Infante, MD  Subjective: Chief Complaint  Patient presents with  . Right Hip - Pain  . Right Leg - Pain  . Lower Back - Pain   HPI: [name] is a very pleasant 83 year old female that comes in today as a self-referral for chronic worsening low back pain mostly on the right side of the right hip and leg pain.  She knows a patient that we see mutually and that patient had good relief and so she wanted to come see if we could help.  Her primary care physician is Dr. Crist Infante who ordered an MRI of her lumbar spine last year in July.  After her MRI findings she had "a series of 3 "injections performed at [facility].  These were performed in August and October and November.  The first injection was interlaminar epidural steroid injection at L5-S1.  The next 2 injections were right L5 transforaminal epidural steroid injections.  The patient reports no relief at all from those injections.  She does not even remember from day 1 that these helped at all.  She then states however if they did help it was not very long.  In terms of her pain complaints she has mostly axial low back pain right more than left and radicular type pain in mostly in L5 distribution down the right leg.  She refers to his hip pain but she does not have any groin pain.  She has been having this off and on intermittently for several years.  She does endorse that cold weather and increased activity seem to make things worse and make her pay for the pain.  She does get some relief with laying down at night and getting into a comfortable position.  She rates her average pain is an 8 out of 10.  Just sitting she is a 3 out of 10.  She describes this as a dull aching type pain.  She denies any paresthesias.  She has had no focal weakness or focal trauma.  She has had no red  flag complaints of unintended weight loss or fevers chills or night sweats.  Her case is complicated by atrial fibrillation and diastolic heart failure and coronary artery disease.  She is on Eliquis for anticoagulation.  She uses Tylenol for pain relief but it does not seem to help very much.  She is not able to use anti-inflammatories.  She has had physical therapy in the past.  She does carry a diagnosis of irritable bowel disease but no diagnosis of fibromyalgia.   Review of Systems  Constitutional: Negative for chills, fever, malaise/fatigue and weight loss.  HENT: Negative for hearing loss and sinus pain.   Eyes: Negative for blurred vision, double vision and photophobia.  Respiratory: Negative for cough and shortness of breath.   Cardiovascular: Negative for chest pain, palpitations and leg swelling.  Gastrointestinal: Negative for abdominal pain, nausea and vomiting.  Genitourinary: Negative for flank pain.  Musculoskeletal: Positive for back pain and joint pain. Negative for myalgias.       Right hip and leg pain  Skin: Negative for itching and rash.  Neurological: Negative for tremors, focal weakness and weakness.  Endo/Heme/Allergies: Negative.   Psychiatric/Behavioral: Negative for depression.  All other systems reviewed and are negative.  Otherwise per HPI.  Assessment & Plan: Visit Diagnoses:  1. Spondylosis without myelopathy or radiculopathy, lumbar region   2. Spondylolisthesis of lumbar region   3. Spinal stenosis of lumbar region without neurogenic claudication     Plan: Findings:  Chronic worsening severe at times right more than left axial low back pain worse with standing and ambulating and going from sit to stand along with a radicular type pattern more of an L5 distribution on the right.  She has had 3 epidural injections at [facility] imaging.  I was able to actually look at those injections and look at the fluoroscopic imaging.  The transforaminal injections were  perfectly placed and really were textbook flow of contrast.  The fact that she reported no relief with that begs the question of is is really a nerve root pain.  There is moderate foraminal stenosis so the other logical question could be that it is nerve root pain but the injections are starting to help.  She does have significant facet arthropathy at L5-S1.  I think the best approach is a diagnostic and hopefully therapeutic facet joint block.  We discussed this at length and she does want to proceed with this.  We can leave her on her blood thinner for that injection.  Other avenues would be regrouping with a different physical therapist and possibly looking at dry needling type issues around that area.  She could have some type of piriformis type issue and gluteus medius type problems.  Otherwise on the differential would be sacroiliac joint pain but usually at this age the sacroiliac joint is somewhat fused and I doubt that is the source of pain at this point.  Medication obviously could be looked at as well to see if there is anything adjunctive we could try.  We will see her back for the injection.    Meds & Orders: No orders of the defined types were placed in this encounter.  No orders of the defined types were placed in this encounter.   Follow-up: Return for Right L5-S1 facet joint block.   Procedures: No procedures performed  No notes on file   Clinical History: MRI LUMBAR SPINE WITHOUT CONTRAST  TECHNIQUE: Multiplanar, multisequence MR imaging of the lumbar spine was performed. No intravenous contrast was administered.  COMPARISON:  03/28/2010 CT abdomen and pelvis.  FINDINGS: Segmentation:  Standard.  Alignment: Grade 1 L5-S1 anterolisthesis and chronic L5 pars defects are stable from the prior CT of the abdomen and pelvis. Otherwise normal lumbar lordosis.  Vertebrae:  No fracture, evidence of discitis, or bone lesion.  Conus medullaris: Extends to the L1-2 level and  appears normal.  Paraspinal and other soft tissues: Negative.  Disc levels:  L1-2: Minimal disc bulge and mild facet hypertrophy. Trace facet effusions. No significant foraminal narrowing or canal stenosis.  L2-3: Small disc bulge eccentric to the left with mild facet hypertrophy. Mild left-greater-than-right foraminal narrowing. No significant canal stenosis.  L3-4: Small disc bulge with mild facet and ligamentum flavum hypertrophy. Mild bilateral foraminal and lateral recess narrowing. No significant canal stenosis.  L4-5: Small disc bulge and central disc protrusion. Moderate facet hypertrophy. Mild bilateral recess narrowing. No significant foraminal narrowing or canal stenosis.  L5-S1: Anterolisthesis with small uncovered disc bulge and advanced facet hypertrophy greater on the right. Severe right and moderate left foraminal narrowing. No significant canal stenosis.  IMPRESSION: 1. No acute osseous abnormality. 2. Grade 1 L5-S1 anterolisthesis and chronic L5 pars defects stable from 2012 CT given differences in technique. 3. Lumbar degenerative changes are greatest at  the L5-S1 level where anterolisthesis combined with disc and facet disease results in severe right and moderate left foraminal narrowing.   Electronically Signed   By: Kristine Garbe M.D.   On: 08/27/2016 22:56   She reports that she has never smoked. She has never used smokeless tobacco. No results for input(s): HGBA1C, LABURIC in the last 8760 hours.  Objective:  VS:  HT:    WT:   BMI:     BP:(!) 143/76  HR:82bpm  TEMP:97.8 F (36.6 C)(Oral)  RESP:99 % Physical Exam  Constitutional: She is oriented to person, place, and time. She appears well-developed and well-nourished. No distress.  HENT:  Head: Normocephalic and atraumatic.  Nose: Nose normal.  Mouth/Throat: Oropharynx is clear and moist.  Eyes: Pupils are equal, round, and reactive to light. Conjunctivae are normal.    Neck: Normal range of motion. Neck supple.  Cardiovascular: Regular rhythm and intact distal pulses.  Pulmonary/Chest: Effort normal. No respiratory distress.  Abdominal: She exhibits no distension. There is no guarding.  Musculoskeletal:  Patient goes from sit to stand with some difficulty and she does have pain with extension rotation to the right of the lumbar spine.  She has no real pain over the greater trochanters although she is a little tender.  She has no pain with hip rotation.  She has good distal strength without clonus.  She has a negative slump test bilaterally.  Neurological: She is alert and oriented to person, place, and time. She exhibits normal muscle tone. Coordination normal.  Skin: Skin is warm. No rash noted. No erythema.  Psychiatric: She has a normal mood and affect. Her behavior is normal.  Nursing note and vitals reviewed.   Ortho Exam Imaging: Xr C-arm No Report  Result Date: 05/29/2017 Please see Notes or Procedures tab for imaging impression.   Past Medical/Family/Surgical/Social History: Medications & Allergies reviewed per EMR, new medications updated. Patient Active Problem List   Diagnosis Date Noted  . History of non-ST elevation myocardial infarction (NSTEMI) 12/17/2016  . Chronic anticoagulation 12/17/2016  . Chest pain with moderate risk for cardiac etiology 05/30/2015  . Internal and external hemorrhoids without complication 12/20/8525  . History of GI bleed 06/24/2014  . Chronic diastolic heart failure ([place]) 06/24/2014  . Chronic atrial fibrillation ([place]) 06/24/2014  . Pre-syncope 05/05/2014  . Osteoarthritis of right knee 08/10/2013  . OSA on CPAP 06/16/2013  . Bradycardia 10/25/2010  . Fatigue 10/25/2010  . Dyspnea 09/18/2010  . Coronary artery disease   . Hypokalemia   . Incontinence of urine   . Stromal tumor of the stomach   . Dyslipidemia 05/06/2007  . Essential hypertension 05/06/2007  . GERD 05/06/2007  . IRRITABLE BOWEL  SYNDROME 05/06/2007   Past Medical History:  Diagnosis Date  . Arthritis   . Atrial fibrillation ([place])   . Bladder polyps   . Cancer ([place])    stomach  . Cataracts, bilateral   . Cervical polyp   . Chronic kidney disease    urinary incont-  . Coronary artery disease    minimal  . Dysrhythmia     AF  hx     dr Acie Fredrickson  . GERD (gastroesophageal reflux disease)   . Hiatal hernia   . Hyperlipidemia   . Hypertension   . Hypokalemia   . IBS (irritable bowel syndrome)   . IFG (impaired fasting glucose)   . Incontinence of urine   . MI, acute, non ST segment elevation ([place]) POE.4,2353   cath  .  OSA (obstructive sleep apnea)    hypoxia , not CO 2 retainer,   . OSA on CPAP 06/16/2013    Sleep study 1- 11-15 with an AHI of 61 , now titrated to CPAP 9 cm water with 2 cm EPR.   . Osteopenia   . Stromal tumor of the stomach    cancer   Family History  Problem Relation Age of Onset  . Pancreatic cancer Paternal Grandmother   . Heart disease Paternal Grandfather        both sides of the family  . Diabetes Paternal Aunt    Past Surgical History:  Procedure Laterality Date  . ANKLE FRACTURE SURGERY  2006   right, plate   . CARDIAC CATHETERIZATION  12/07/20210   Dr.Nahser,minor cad  . COLONOSCOPY    . COLONOSCOPY N/A October 04, 202016   Procedure: COLONOSCOPY;  Surgeon: Inda Castle, MD;  Location: [place];  Service: Endoscopy;  Laterality: N/A;  . ENTEROSCOPY N/A October 04, 202016   Procedure: ENTEROSCOPY;  Surgeon: Inda Castle, MD;  Location: [facility] ENDOSCOPY;  Service: Endoscopy;  Laterality: N/A;  . EYE SURGERY     both cataracts  . KNEE ARTHROSCOPY Right 05/28/2012   Procedure: RIGHT KNEE ARTHROSCOPY PARTIAL MEDIAL AND LATERAL MENISECTOMY WITH CHONDROPLASTY;  Surgeon: Alta Corning, MD;  Location: [place];  Service: Orthopedics;  Laterality: Right;  . stromal stomach  tumor  03/26/2007   resection  . TOTAL KNEE ARTHROPLASTY Right 08/10/2013   Procedure: TOTAL KNEE  ARTHROPLASTY;  Surgeon: Alta Corning, MD;  Location: [place];  Service: Orthopedics;  Laterality: Right;  . UPPER GI ENDOSCOPY     Social History   Occupational History  . Occupation: retired    Fish farm manager: RETIRED  Tobacco Use  . Smoking status: Never Smoker  . Smokeless tobacco: Never Used  Substance and Sexual Activity  . Alcohol use: Yes    Alcohol/week: 1.2 oz    Types: 2 Glasses of wine per week    Comment: one per week  . Drug use: No  . Sexual activity: Never

## 2017-06-05 NOTE — Procedures (Signed)
Lumbar Facet Joint Intra-Articular Injection(s) with Fluoroscopic Guidance  [name]      Date of Birth: 1934-03-26 MRN: [id number] PCP: Crist Infante, MD      Visit Date: 05/29/2017   Universal Protocol:    Date/Time: 05/29/2017  Consent Given By: the patient  Position: PRONE   Additional Comments: Vital signs were monitored before and after the procedure. Patient was prepped and draped in the usual sterile fashion. The correct patient, procedure, and site was verified.   Injection Procedure Details:  Procedure Site One Meds Administered:  Meds ordered this encounter  Medications  . methylPREDNISolone acetate (DEPO-MEDROL) injection 80 mg     Laterality: Right  Location/Site:  L5-S1  Needle size: 22 guage  Needle type: Spinal  Needle Placement: Articular  Findings:  -Comments: Excellent flow of contrast producing a partial arthrogram.  Procedure Details: The fluoroscope beam is vertically oriented in AP, and the inferior recess is visualized beneath the lower pole of the inferior apophyseal process, which represents the target point for needle insertion. When direct visualization is difficult the target point is located at the medial projection of the vertebral pedicle. The region overlying each aforementioned target is locally anesthetized with a 1 to 2 ml. volume of 1% Lidocaine without Epinephrine.   The spinal needle was inserted into each of the above mentioned facet joints using biplanar fluoroscopic guidance. A 0.25 to 0.5 ml. volume of Isovue-250 was injected and a partial facet joint arthrogram was obtained. A single spot film was obtained of the resulting arthrogram.    One to 1.25 ml of the steroid/anesthetic solution was then injected into each of the facet joints noted above.   Additional Comments:  The patient tolerated the procedure well Dressing: Band-Aid    Post-procedure details: Patient was observed during the  procedure. Post-procedure instructions were reviewed.  Patient left the clinic in stable condition.

## 2017-06-05 NOTE — Progress Notes (Signed)
[name] - 83 y.o. female MRN [id number]  Date of birth: [birth year]  Office Visit Note: Visit Date: 05/29/2017 PCP: Sabrina Infante, MD Referred by: Sabrina Infante, MD  Subjective: Chief Complaint  Patient presents with  . Lower Back - Pain   HPI: [name] is an 83 year old female that comes in today for planned right L5-S1 facet joint block.  Please see our prior evaluation and management note for further details and justification.  There was some confusion and mixed up on both of our parts about needing a driver for the injection.  She was able to get a driver to come pick her up.   ROS Otherwise per HPI.  Assessment & Plan: Visit Diagnoses:  1. Spondylosis without myelopathy or radiculopathy, lumbar region   2. Spondylolisthesis of lumbar region     Plan: No additional findings.   Meds & Orders:  Meds ordered this encounter  Medications  . methylPREDNISolone acetate (DEPO-MEDROL) injection 80 mg    Orders Placed This Encounter  Procedures  . Facet Injection  . XR C-ARM NO REPORT    Follow-up: Return in about 2 weeks (around 06/12/2017).   Procedures: No procedures performed  Lumbar Facet Joint Intra-Articular Injection(s) with Fluoroscopic Guidance  [name]      Date of Birth: [birth year] MRN: [id number] PCP: Sabrina Infante, MD      Visit Date: 05/29/2017   Universal Protocol:    Date/Time: 05/29/2017  Consent Given By: the patient  Position: PRONE   Additional Comments: Vital signs were monitored before and after the procedure. Patient was prepped and draped in the usual sterile fashion. The correct patient, procedure, and site was verified.   Injection Procedure Details:  Procedure Site One Meds Administered:  Meds ordered this encounter  Medications  . methylPREDNISolone acetate (DEPO-MEDROL) injection 80 mg     Laterality: Right  Location/Site:  L5-S1  Needle size: 22 guage  Needle type: Spinal  Needle Placement:  Articular  Findings:  -Comments: Excellent flow of contrast producing a partial arthrogram.  Procedure Details: The fluoroscope beam is vertically oriented in AP, and the inferior recess is visualized beneath the lower pole of the inferior apophyseal process, which represents the target point for needle insertion. When direct visualization is difficult the target point is located at the medial projection of the vertebral pedicle. The region overlying each aforementioned target is locally anesthetized with a 1 to 2 ml. volume of 1% Lidocaine without Epinephrine.   The spinal needle was inserted into each of the above mentioned facet joints using biplanar fluoroscopic guidance. A 0.25 to 0.5 ml. volume of Isovue-250 was injected and a partial facet joint arthrogram was obtained. A single spot film was obtained of the resulting arthrogram.    One to 1.25 ml of the steroid/anesthetic solution was then injected into each of the facet joints noted above.   Additional Comments:  The patient tolerated the procedure well Dressing: Band-Aid    Post-procedure details: Patient was observed during the procedure. Post-procedure instructions were reviewed.  Patient left the clinic in stable condition.    Clinical History: MRI LUMBAR SPINE WITHOUT CONTRAST  TECHNIQUE: Multiplanar, multisequence MR imaging of the lumbar spine was performed. No intravenous contrast was administered.  COMPARISON:  03/28/2010 CT abdomen and pelvis.  FINDINGS: Segmentation:  Standard.  Alignment: Grade 1 L5-S1 anterolisthesis and chronic L5 pars defects are stable from the prior CT of the abdomen and pelvis. Otherwise normal lumbar lordosis.  Vertebrae:  No  fracture, evidence of discitis, or bone lesion.  Conus medullaris: Extends to the L1-2 level and appears normal.  Paraspinal and other soft tissues: Negative.  Disc levels:  L1-2: Minimal disc bulge and mild facet hypertrophy. Trace  facet effusions. No significant foraminal narrowing or canal stenosis.  L2-3: Small disc bulge eccentric to the left with mild facet hypertrophy. Mild left-greater-than-right foraminal narrowing. No significant canal stenosis.  L3-4: Small disc bulge with mild facet and ligamentum flavum hypertrophy. Mild bilateral foraminal and lateral recess narrowing. No significant canal stenosis.  L4-5: Small disc bulge and central disc protrusion. Moderate facet hypertrophy. Mild bilateral recess narrowing. No significant foraminal narrowing or canal stenosis.  L5-S1: Anterolisthesis with small uncovered disc bulge and advanced facet hypertrophy greater on the right. Severe right and moderate left foraminal narrowing. No significant canal stenosis.  IMPRESSION: 1. No acute osseous abnormality. 2. Grade 1 L5-S1 anterolisthesis and chronic L5 pars defects stable from 2012 CT given differences in technique. 3. Lumbar degenerative changes are greatest at the L5-S1 level where anterolisthesis combined with disc and facet disease results in severe right and moderate left foraminal narrowing.   Electronically Signed   By: Kristine Garbe M.D.   On: 08/27/2016 22:56   She reports that she has never smoked. She has never used smokeless tobacco. No results for input(s): HGBA1C, LABURIC in the last 8760 hours.  Objective:  VS:  HT:    WT:   BMI:     BP:(!) 137/98  HR:68bpm  TEMP:97.6 F (36.4 C)( )  RESP:98 % Physical Exam  Ortho Exam Imaging: No results found.  Past Medical/Family/Surgical/Social History: Medications & Allergies reviewed per EMR, new medications updated. Patient Active Problem List   Diagnosis Date Noted  . History of non-ST elevation myocardial infarction (NSTEMI) 12/17/2016  . Chronic anticoagulation 12/17/2016  . Chest pain with moderate risk for cardiac etiology 05/30/2015  . Internal and external hemorrhoids without complication 31/49/7026  .  History of GI bleed 06/24/2014  . Chronic diastolic heart failure ([place]) 06/24/2014  . Chronic atrial fibrillation ([place]) 06/24/2014  . Pre-syncope 05/05/2014  . Osteoarthritis of right knee 08/10/2013  . OSA on CPAP 06/16/2013  . Bradycardia 10/25/2010  . Fatigue 10/25/2010  . Dyspnea 09/18/2010  . Coronary artery disease   . Hypokalemia   . Incontinence of urine   . Stromal tumor of the stomach   . Dyslipidemia 05/06/2007  . Essential hypertension 05/06/2007  . GERD 05/06/2007  . IRRITABLE BOWEL SYNDROME 05/06/2007   Past Medical History:  Diagnosis Date  . Arthritis   . Atrial fibrillation ([place])   . Bladder polyps   . Cancer ([place])    stomach  . Cataracts, bilateral   . Cervical polyp   . Chronic kidney disease    urinary incont-  . Coronary artery disease    minimal  . Dysrhythmia     AF  hx     dr Acie Fredrickson  . GERD (gastroesophageal reflux disease)   . Hiatal hernia   . Hyperlipidemia   . Hypertension   . Hypokalemia   . IBS (irritable bowel syndrome)   . IFG (impaired fasting glucose)   . Incontinence of urine   . MI, acute, non ST segment elevation ([facility]) VZC.5,8850   cath  . OSA (obstructive sleep apnea)    hypoxia , not CO 2 retainer,   . OSA on CPAP 06/16/2013    Sleep study 1- 11-15 with an AHI of 61 , now titrated to CPAP 9  cm water with 2 cm EPR.   . Osteopenia   . Stromal tumor of the stomach    cancer   Family History  Problem Relation Age of Onset  . Pancreatic cancer Paternal Grandmother   . Heart disease Paternal Grandfather        both sides of the family  . Diabetes Paternal Aunt    Past Surgical History:  Procedure Laterality Date  . ANKLE FRACTURE SURGERY  2006   right, plate   . CARDIAC CATHETERIZATION  12/07/20210   Dr.Nahser,minor cad  . COLONOSCOPY    . COLONOSCOPY N/A 2020/10/1114   Procedure: COLONOSCOPY;  Surgeon: Inda Castle, MD;  Location: [place];  Service: Endoscopy;  Laterality: N/A;  . ENTEROSCOPY N/A 2020/10/1114    Procedure: ENTEROSCOPY;  Surgeon: Inda Castle, MD;  Location: [facility] ENDOSCOPY;  Service: Endoscopy;  Laterality: N/A;  . EYE SURGERY     both cataracts  . KNEE ARTHROSCOPY Right 05/28/2012   Procedure: RIGHT KNEE ARTHROSCOPY PARTIAL MEDIAL AND LATERAL MENISECTOMY WITH CHONDROPLASTY;  Surgeon: Alta Corning, MD;  Location: [place];  Service: Orthopedics;  Laterality: Right;  . stromal stomach  tumor  03/26/2007   resection  . TOTAL KNEE ARTHROPLASTY Right 08/10/2013   Procedure: TOTAL KNEE ARTHROPLASTY;  Surgeon: Alta Corning, MD;  Location: [place];  Service: Orthopedics;  Laterality: Right;  . UPPER GI ENDOSCOPY     Social History   Occupational History  . Occupation: retired    Fish farm manager: RETIRED  Tobacco Use  . Smoking status: Never Smoker  . Smokeless tobacco: Never Used  Substance and Sexual Activity  . Alcohol use: Yes    Alcohol/week: 1.2 oz    Types: 2 Glasses of wine per week    Comment: one per week  . Drug use: No  . Sexual activity: Never

## 2017-06-20 ENCOUNTER — Ambulatory Visit (INDEPENDENT_AMBULATORY_CARE_PROVIDER_SITE_OTHER): Payer: Medicare Other | Admitting: Physical Medicine and Rehabilitation

## 2017-06-20 ENCOUNTER — Encounter (INDEPENDENT_AMBULATORY_CARE_PROVIDER_SITE_OTHER): Payer: Self-pay | Admitting: Physical Medicine and Rehabilitation

## 2017-06-20 VITALS — BP 137/74 | HR 69 | Temp 98.1°F

## 2017-06-20 DIAGNOSIS — M47816 Spondylosis without myelopathy or radiculopathy, lumbar region: Secondary | ICD-10-CM | POA: Diagnosis not present

## 2017-06-20 DIAGNOSIS — M48061 Spinal stenosis, lumbar region without neurogenic claudication: Secondary | ICD-10-CM | POA: Diagnosis not present

## 2017-06-20 DIAGNOSIS — M5416 Radiculopathy, lumbar region: Secondary | ICD-10-CM
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
## 2017-06-20 NOTE — Progress Notes (Signed)
.  Numeric Pain Rating Scale and Functional Assessment Average Pain 5 Pain Right Now 5 My pain is constant, sharp, dull and aching Pain is worse with: some activites Pain improves with: rest   In the last MONTH (on 0-10 scale) has pain interfered with the following?  1. General activity like being  able to carry out your everyday physical activities such as walking, climbing stairs, carrying groceries, or moving a chair?  Rating(4)  2. Relation with others like being able to carry out your usual social activities and roles such as  activities at home, at work and in your community. Rating(0)  3. Enjoyment of life such that you have  been bothered by emotional problems such as feeling anxious, depressed or irritable?  Rating(0)

## 2017-06-25 ENCOUNTER — Other Ambulatory Visit (INDEPENDENT_AMBULATORY_CARE_PROVIDER_SITE_OTHER): Payer: Self-pay | Admitting: Physical Medicine and Rehabilitation

## 2017-06-25 DIAGNOSIS — M7918 Myalgia, other site: Secondary | ICD-10-CM

## 2017-07-01 ENCOUNTER — Encounter (INDEPENDENT_AMBULATORY_CARE_PROVIDER_SITE_OTHER): Payer: Medicare Other | Admitting: Physical Medicine and Rehabilitation

## 2017-07-25 ENCOUNTER — Telehealth (INDEPENDENT_AMBULATORY_CARE_PROVIDER_SITE_OTHER): Payer: Self-pay | Admitting: Physical Medicine and Rehabilitation

## 2017-07-25 NOTE — Telephone Encounter (Signed)
Patient has been seeing Dr. Joaquin Music at [facility] for about 3 weeks from the referral Dr. Ernestina Patches sent. She said this provider wants to have a consultation and speak with Dr. Ernestina Patches about further treatment for her. So patient asked if its possible for Dr. Ernestina Patches to call him. Please advise

## 2017-07-25 NOTE — Telephone Encounter (Signed)
Please advise 

## 2017-07-26 ENCOUNTER — Telehealth (INDEPENDENT_AMBULATORY_CARE_PROVIDER_SITE_OTHER): Payer: Self-pay | Admitting: *Deleted

## 2017-07-29 NOTE — Telephone Encounter (Signed)
Do we till have the note from PT on this patient? It was a letter from [employer] that I wanted scanned? Can ask Loma Sousa as well

## 2017-07-29 NOTE — Telephone Encounter (Signed)
Yes I still have the note.

## 2017-07-30 NOTE — Telephone Encounter (Signed)
We just need to have her come in for OV and or also see Sharol Given to look at her ankle.

## 2017-07-30 NOTE — Telephone Encounter (Signed)
Pt is scheduled for an OV 08/21/17 at 10am

## 2017-08-12 ENCOUNTER — Ambulatory Visit (INDEPENDENT_AMBULATORY_CARE_PROVIDER_SITE_OTHER): Payer: Medicare Other

## 2017-08-12 ENCOUNTER — Ambulatory Visit (INDEPENDENT_AMBULATORY_CARE_PROVIDER_SITE_OTHER): Payer: Medicare Other | Admitting: Orthopedic Surgery

## 2017-08-12 ENCOUNTER — Encounter (INDEPENDENT_AMBULATORY_CARE_PROVIDER_SITE_OTHER): Payer: Self-pay | Admitting: Orthopedic Surgery

## 2017-08-12 VITALS — Ht 64.5 in | Wt 130.0 lb

## 2017-08-12 DIAGNOSIS — M25571 Pain in right ankle and joints of right foot: Secondary | ICD-10-CM | POA: Diagnosis not present

## 2017-08-12 DIAGNOSIS — M12571 Traumatic arthropathy, right ankle and foot: Secondary | ICD-10-CM | POA: Diagnosis not present

## 2017-08-12 MED ORDER — LIDOCAINE HCL 1 % IJ SOLN
2.0000 mL | INTRAMUSCULAR | Status: AC | PRN
  Administered 2017-08-12: 2 mL

## 2017-08-12 MED ORDER — METHYLPREDNISOLONE ACETATE 40 MG/ML IJ SUSP
40.0000 mg | INTRAMUSCULAR | Status: AC | PRN
Start: 2017-08-12 — End: 2017-08-12
  Administered 2017-08-12: 40 mg via INTRA_ARTICULAR
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
## 2017-08-21 ENCOUNTER — Ambulatory Visit (INDEPENDENT_AMBULATORY_CARE_PROVIDER_SITE_OTHER): Payer: Medicare Other | Admitting: Physical Medicine and Rehabilitation

## 2017-08-21 ENCOUNTER — Encounter (INDEPENDENT_AMBULATORY_CARE_PROVIDER_SITE_OTHER): Payer: Self-pay | Admitting: Physical Medicine and Rehabilitation

## 2017-08-21 VITALS — BP 121/73 | HR 85

## 2017-08-21 DIAGNOSIS — M5416 Radiculopathy, lumbar region: Secondary | ICD-10-CM

## 2017-08-21 DIAGNOSIS — M4316 Spondylolisthesis, lumbar region: Secondary | ICD-10-CM

## 2017-08-21 DIAGNOSIS — G894 Chronic pain syndrome: Secondary | ICD-10-CM | POA: Diagnosis not present

## 2017-08-21 DIAGNOSIS — M47816 Spondylosis without myelopathy or radiculopathy, lumbar region: Secondary | ICD-10-CM

## 2017-08-21 NOTE — Progress Notes (Signed)
  Numeric Pain Rating Scale and Functional Assessment Average Pain 5 Pain Right Now 4 My pain is intermittent, dull and aching Pain is worse with: sitting Pain improves with: rest and distraction   In the last MONTH (on 0-10 scale) has pain interfered with the following?  1. General activity like being  able to carry out your everyday physical activities such as walking, climbing stairs, carrying groceries, or moving a chair?  Rating(7)  2. Relation with others like being able to carry out your usual social activities and roles such as  activities at home, at work and in your community. Rating(7)  3. Enjoyment of life such that you have  been bothered by emotional problems such as feeling anxious, depressed or irritable?  Rating(6)

## 2017-08-22 ENCOUNTER — Encounter: Payer: Self-pay | Admitting: Cardiovascular Disease

## 2017-08-22 ENCOUNTER — Ambulatory Visit: Payer: Medicare Other | Admitting: Cardiovascular Disease

## 2017-08-22 VITALS — BP 130/60 | HR 72 | Ht 64.0 in | Wt 135.1 lb

## 2017-08-22 DIAGNOSIS — I482 Chronic atrial fibrillation, unspecified: Secondary | ICD-10-CM

## 2017-08-22 DIAGNOSIS — I10 Essential (primary) hypertension: Secondary | ICD-10-CM | POA: Diagnosis not present

## 2017-08-22 MED ORDER — TRAMADOL HCL 50 MG PO TABS: 50.0000 mg | ORAL_TABLET | Freq: Two times a day (BID) | ORAL | 0 refills | Status: DC | PRN

## 2017-08-22 NOTE — Patient Instructions (Signed)
Medication Instructions: Your physician recommends that you continue on your current medications as directed. Please refer to the Current Medication list given to you today.  Labwork: None Ordered  Procedures/Testing: None Ordered  Follow-Up: Your physician wants you to follow-up in: 1 YEAR with Dr. Acie Fredrickson. You will receive a reminder letter in the mail two months in advance. If you don't receive a letter, please call our office to schedule the follow-up appointment.   If you need a refill on your cardiac medications before your next appointment, please call your pharmacy.

## 2017-08-22 NOTE — Progress Notes (Signed)
Cardiology Office Note   Date:  08/22/2017   ID:  [name], DOB October 26, 1934, MRN [id number]  PCP:  Crist Infante, MD  Cardiologist:   Mertie Moores, MD   Chief Complaint  Patient presents with  . Hypertension  . Atrial Fibrillation  . Congestive Heart Failure   1. Small NSTEMI by cardiac enzymes but minimal CAD by cath 2. LVH 3.HTN 4. Hyperlipidemia 5. Hypokalemia 6. Atrial Fibrillation  Previous history   [name] is doing fairly well. She is walking and exercising some. She notes that her heart rate is still quite variable. She has a hx of  PAF but typically does not notice if her HR is irregular. She continues to have fatigue. We tried cutting her atenolol to 25 mg daily which may have helped.   Dec. 2, 2014:  [name] is doing well. No specific cardiac issues. Occasionally has some dyspnea. She is more fatigued than she would like to be. She has had a sleep study and is being considered for CPAP.  She has had some bleeding in her stool - just occult bleeding. GI work up was negative.  She cannot tell when she is in A-fib or NSR  Dec. 3, 2015: She is seen for follow up of her atrial fib. . Fatigue seems to be better off the atenolol.   May 21, 2014:  [name] is a 83 y.o. female who presents for follow-up of her recent syncopal episode.   The  episode of pre-symcope occurred after she got out of the shower.  She had an episode of syncope that may have been due to a vasovagal reaction. She is 1 monitor the past several weeks she's not had any slow heart rates that would explain the cause of syncope.  She has had RUQ pain.  Seems to be worse after eating lunch today.  Associated with belching.  Exercising regularly.  Feeling well   May 30, 2015:  [name] is doing well.   BP was a little elevated - Dr. Joylene Draft started Lasix 20 mg a day in addition to her HCTZ 12. 5 mg a day .  BP is now better.  Has occasional chest pressure - indigestion like pain , may  be related to eating lunch .    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   She does not have any CP with her work outs.  Does have some DOE  - occasionally with housework   Records from Dr. Joylene Draft were reviewed.   Sept. 11, 2018:  [name] is seen today .  Hx of  Chronic diastolic CHF, CAD and atrial fib - on eliquis  Having some chest pressure / her husband died this past year.  Not related to exercise.  Does not do yard work. The pain is just there.   Has been sluggish  Some of her CP may be due to a GI etiology - the pain seems to sink down into the abd through the day .   January 11, 2017:  And was recently seen for some episodes of chest discomfort.   The chest pain was in the lower aspect of her chest and moved down into her abdomen.     Husband [name] died in 03-31-2022. .  A stress Myoview study was negative for ischemia.  She has normal left ventricular systolic function with an ejection fraction of 65%. Overall feeling well.    When  reconciling her medications, it was clear that she did not have any idea what she was on.  Our records show that she was on both Lasix and HCTZ and a low-dose metoprolol. In fact, she was on HCTZ, no Lasix, and metoprolol 100 mg a day.  August 22, 2017: And is seen today for follow-up visit.  She has a history of mild coronary artery disease.  She has a history of chronic atrial fibrillation and is on Eliquis. Her husband [name] pasted away last year.   Still struggling with that. Has lost some weight . Lives at [employer], eats a full meal once a day and then snacks through the day .    Past Medical History:  Diagnosis Date  . Arthritis   . Atrial fibrillation ([place])   . Bladder polyps   . Cancer ([place])    stomach  . Cataracts, bilateral   . Cervical polyp   . Chronic kidney disease    urinary incont-  . Coronary artery disease    minimal  . Dysrhythmia     AF  hx     dr Acie Fredrickson  . GERD (gastroesophageal  reflux disease)   . Hiatal hernia   . Hyperlipidemia   . Hypertension   . Hypokalemia   . IBS (irritable bowel syndrome)   . IFG (impaired fasting glucose)   . Incontinence of urine   . MI, acute, non ST segment elevation ([place]) DGL.8,7564   cath  . OSA (obstructive sleep apnea)    hypoxia , not CO 2 retainer,   . OSA on CPAP 06/16/2013    Sleep study 1- 11-15 with an AHI of 61 , now titrated to CPAP 9 cm water with 2 cm EPR.   . Osteopenia   . Stromal tumor of the stomach    cancer    Past Surgical History:  Procedure Laterality Date  . ANKLE FRACTURE SURGERY  2006   right, plate   . CARDIAC CATHETERIZATION  12/07/20210   Dr.Nahser,minor cad  . COLONOSCOPY    . COLONOSCOPY N/A 18-May-202016   Procedure: COLONOSCOPY;  Surgeon: Inda Castle, MD;  Location: [place];  Service: Endoscopy;  Laterality: N/A;  . ENTEROSCOPY N/A 18-May-202016   Procedure: ENTEROSCOPY;  Surgeon: Inda Castle, MD;  Location: [facility] ENDOSCOPY;  Service: Endoscopy;  Laterality: N/A;  . EYE SURGERY     both cataracts  . KNEE ARTHROSCOPY Right 05/28/2012   Procedure: RIGHT KNEE ARTHROSCOPY PARTIAL MEDIAL AND LATERAL MENISECTOMY WITH CHONDROPLASTY;  Surgeon: Alta Corning, MD;  Location: [place];  Service: Orthopedics;  Laterality: Right;  . stromal stomach  tumor  03/26/2007   resection  . TOTAL KNEE ARTHROPLASTY Right 08/10/2013   Procedure: TOTAL KNEE ARTHROPLASTY;  Surgeon: Alta Corning, MD;  Location: [place];  Service: Orthopedics;  Laterality: Right;  . UPPER GI ENDOSCOPY       Current Outpatient Medications  Medication Sig Dispense Refill  . amLODipine (NORVASC) 5 MG tablet Take 1 tablet (5 mg total) by mouth daily. *Please call and schedule a one year follow up appointment* 30 tablet 0  . atorvastatin (LIPITOR) 40 MG tablet 1/2 tablet by mouth daily     . calcium carbonate (TUMS - DOSED IN MG ELEMENTAL CALCIUM) 500 MG chewable tablet Chew 1 tablet by mouth 3 (three) times daily as  needed for indigestion or heartburn.    . cholecalciferol (VITAMIN D) 1000 units tablet Take 1,000 Units by mouth daily.    [name]  doxazosin (CARDURA) 4 MG tablet Take 4 mg by mouth daily.    [name] ELIQUIS 5 MG TABS tablet Take 5 mg by mouth 2 (two) times daily.  11  . esomeprazole (NEXIUM) 40 MG capsule Take 40 mg by mouth daily at 12 noon.    . galantamine (RAZADYNE ER) 8 MG 24 hr capsule Take 8 mg daily with breakfast by mouth.    . hydrochlorothiazide (HYDRODIURIL) 12.5 MG tablet Take 12.5 mg by mouth 2 (two) times daily.  3  . metoprolol succinate (TOPROL-XL) 100 MG 24 hr tablet Take 1 tablet (100 mg total) daily by mouth. Take with or immediately following a meal. 30 tablet 6  . Multiple Vitamin (MULTIVITAMIN WITH MINERALS) TABS tablet Take 1 tablet by mouth daily.    . multivitamin-lutein (OCUVITE-LUTEIN) CAPS capsule Take 1 capsule by mouth daily.    . nitroGLYCERIN (NITROSTAT) 0.4 MG SL tablet Place 1 tablet (0.4 mg total) under the tongue every 5 (five) minutes as needed for chest pain. 25 tablet prn  . potassium chloride SA (K-DUR,KLOR-CON) 20 MEQ tablet Take 1 tablet (20 mEq total) by mouth 2 (two) times daily. 8 tablet 0  . valsartan (DIOVAN) 160 MG tablet TAKE 1 TABLET BY MOUTH TWICE A DAY 180 tablet 3   No current facility-administered medications for this visit.     Allergies:   Detrol [tolterodine]; Tranexamic acid; Xarelto [rivaroxaban]; and Pradaxa [dabigatran etexilate mesylate]    Social History:  The patient  reports that she has never smoked. She has never used smokeless tobacco. She reports that she drinks about 1.2 oz of alcohol per week. She reports that she does not use drugs.   Family History:  The patient's family history includes Diabetes in her paternal aunt; Heart disease in her paternal grandfather; Pancreatic cancer in her paternal grandmother.    ROS:   Noted in current history, otherwise review of systems is negative.   Physical Exam: Blood pressure 130/60,  pulse 72, height 5\' 4"  (1.626 m), weight 135 lb 1.9 oz (61.3 kg), SpO2 96 %.  GEN:   Elderly female,   NAD  HEENT: Normal NECK: No JVD; No carotid bruits LYMPHATICS: No lymphadenopathy CARDIAC: irreg. Irreg.  RESPIRATORY:  Clear to auscultation without rales, wheezing or rhonchi  ABDOMEN: Soft, non-tender, non-distended MUSCULOSKELETAL:  No edema; No deformity  SKIN: Warm and dry NEUROLOGIC:  Alert and oriented x 3   EKG:      Recent Labs: No results found for requested labs within last 8760 hours.    Lipid Panel    Component Value Date/Time   CHOL 123 10/30/2011 0928   TRIG 38.0 10/30/2011 0928   HDL 56.60 10/30/2011 0928   CHOLHDL 2 10/30/2011 0928   VLDL 7.6 10/30/2011 0928   LDLCALC 59 10/30/2011 0928      Wt Readings from Last 3 Encounters:  08/22/17 135 lb 1.9 oz (61.3 kg)  08/12/17 130 lb (59 kg)  01/11/17 134 lb 1.9 oz (60.8 kg)      Other studies Reviewed: Additional studies/ records that were reviewed today include: . Review of the above records demonstrates:    ASSESSMENT AND PLAN:  1. Small NSTEMI by cardiac enzymes but minimal CAD by cath on Dec. 7, 2010. No recent CP    2.HTN - BP is well controlled.  ..  4. Hyperlipidemia -  Managed by Dr. Merryl Hacker    5. Atrial Fibrillation - on Eliquis,   Labs checked throught Dr. Joylene Draft  HR is well  controlled.   7. Pre-syncope :     8.  Dementia :        Current medicines are reviewed at length with the patient today.  The patient does not have concerns regarding medicines.  The following changes have been made:  no change  Labs/ tests ordered today include:  No orders of the defined types were placed in this encounter.    Disposition:   FU with me in 6 months   Signed, Mertie Moores, MD  08/22/2017 10:11 AM    [facility] [place], [place], [place]  [address] Phone: [phone]; Fax: [phone]

## 2017-09-11 ENCOUNTER — Ambulatory Visit (INDEPENDENT_AMBULATORY_CARE_PROVIDER_SITE_OTHER): Payer: Medicare Other | Admitting: Physical Medicine and Rehabilitation

## 2017-09-11 ENCOUNTER — Encounter (INDEPENDENT_AMBULATORY_CARE_PROVIDER_SITE_OTHER): Payer: Self-pay | Admitting: Physical Medicine and Rehabilitation

## 2017-09-11 ENCOUNTER — Ambulatory Visit (INDEPENDENT_AMBULATORY_CARE_PROVIDER_SITE_OTHER): Payer: Self-pay

## 2017-09-11 VITALS — BP 142/75 | HR 86

## 2017-09-11 DIAGNOSIS — M48061 Spinal stenosis, lumbar region without neurogenic claudication: Secondary | ICD-10-CM

## 2017-09-11 DIAGNOSIS — M5416 Radiculopathy, lumbar region: Secondary | ICD-10-CM | POA: Diagnosis not present

## 2017-09-11 MED ORDER — BETAMETHASONE SOD PHOS & ACET 6 (3-3) MG/ML IJ SUSP
12.0000 mg | Freq: Once | INTRAMUSCULAR | Status: AC
  Administered 2017-09-11: 12 mg

## 2017-09-11 NOTE — Progress Notes (Signed)
 .  Numeric Pain Rating Scale and Functional Assessment Average Pain 8   In the last MONTH (on 0-10 scale) has pain interfered with the following?  1. General activity like being  able to carry out your everyday physical activities such as walking, climbing stairs, carrying groceries, or moving a chair?  Rating(6)   +Driver, +BT(ok for eliquis), -Dye Allergies.

## 2017-09-11 NOTE — Patient Instructions (Signed)

## 2017-09-16 ENCOUNTER — Ambulatory Visit (INDEPENDENT_AMBULATORY_CARE_PROVIDER_SITE_OTHER): Payer: Medicare Other | Admitting: Orthopedic Surgery

## 2017-09-16 ENCOUNTER — Encounter (INDEPENDENT_AMBULATORY_CARE_PROVIDER_SITE_OTHER): Payer: Self-pay | Admitting: Orthopedic Surgery

## 2017-09-16 VITALS — Ht 64.5 in | Wt 130.0 lb

## 2017-09-16 DIAGNOSIS — M25571 Pain in right ankle and joints of right foot: Secondary | ICD-10-CM

## 2017-09-17 ENCOUNTER — Encounter (INDEPENDENT_AMBULATORY_CARE_PROVIDER_SITE_OTHER): Payer: Self-pay | Admitting: Orthopedic Surgery

## 2017-09-17 DIAGNOSIS — M25571 Pain in right ankle and joints of right foot: Secondary | ICD-10-CM

## 2017-09-17 MED ORDER — METHYLPREDNISOLONE ACETATE 40 MG/ML IJ SUSP
40.0000 mg | INTRAMUSCULAR | Status: AC | PRN
  Administered 2017-09-17: 40 mg via INTRA_ARTICULAR

## 2017-09-17 MED ORDER — LIDOCAINE HCL 1 % IJ SOLN
2.0000 mL | INTRAMUSCULAR | Status: AC | PRN
  Administered 2017-09-17: 2 mL

## 2017-09-17 NOTE — Progress Notes (Signed)
Office Visit Note   [name]           Date of Birth: Jan 26, 1935           MRN: [id number] Visit Date: 09/16/2017              Requested by: Crist Infante, MD [address] [place], [place] [address] PCP: Crist Infante, MD  Chief Complaint  Patient presents with  . Right Ankle - Pain, Follow-up      HPI: Patient is a 83 year old woman who presents complaining of right ankle pain.  She is about a month out from a previous injection which provided minimal relief.  She complains of increasing pain in the right ankle at this time.  She states she currently sees [facility] for her radicular back symptoms but this is different than her ankle symptoms.  Assessment & Plan: Visit Diagnoses:  1. Pain in right ankle and joints of right foot     Plan: Right ankle was injected from the anterior medial portal.  She is to continue with her regular activities.  Recommended an ASO to help when she is on uneven terrain.  Follow-Up Instructions: Return if symptoms worsen or fail to improve.   Ortho Exam  Patient is alert, oriented, no adenopathy, well-dressed, normal affect, normal respiratory effort. Examination patient has good pulses.  Radiographs shows osteoarthritis of the right ankle.  She has pain with subtalar motion.  Pain to palpation anteriorly over the ankle.  There is no redness no cellulitis.  Imaging: No results found. No images are attached to the encounter.  Labs: Lab Results  Component Value Date   REPTSTATUS 09/10/2007 FINAL 09/08/2007   CULT INSIGNIFICANT GROWTH 09/08/2007     Lab Results  Component Value Date   ALBUMIN 3.5 06/24/2014   ALBUMIN 3.7 08/07/2013   ALBUMIN 3.9 11/22/2011    Body mass index is 21.97 kg/m.  Orders:  No orders of the defined types were placed in this encounter.  No orders of the defined types were placed in this encounter.    Procedures: Medium Joint Inj: R ankle on 09/17/2017 6:15 PM Indications: pain and diagnostic  evaluation Details: 22 G 1.5 in needle, anteromedial approach Medications: 2 mL lidocaine 1 %; 40 mg methylPREDNISolone acetate 40 MG/ML Outcome: tolerated well, no immediate complications Procedure, treatment alternatives, risks and benefits explained, specific risks discussed. Consent was given by the patient. Immediately prior to procedure a time out was called to verify the correct patient, procedure, equipment, support staff and site/side marked as required. Patient was prepped and draped in the usual sterile fashion.      Clinical Data: No additional findings.  ROS:  All other systems negative, except as noted in the HPI. Review of Systems  Objective: Vital Signs: Ht 5' 4.5" (1.638 m)   Wt 130 lb (59 kg)   BMI 21.97 kg/m   Specialty Comments:  No specialty comments available.  PMFS History: Patient Active Problem List   Diagnosis Date Noted  . Traumatic arthritis of ankle, right 08/12/2017  . History of non-ST elevation myocardial infarction (NSTEMI) 12/17/2016  . Chronic anticoagulation 12/17/2016  . Chest pain with moderate risk for cardiac etiology 05/30/2015  . Internal and external hemorrhoids without complication 93/81/8299  . History of GI bleed 06/24/2014  . Chronic diastolic heart failure ([place]) 06/24/2014  . Chronic atrial fibrillation ([place]) 06/24/2014  . Pre-syncope 05/05/2014  . Osteoarthritis of right knee 08/10/2013  . OSA on CPAP 06/16/2013  .  Bradycardia 10/25/2010  . Fatigue 10/25/2010  . Dyspnea 09/18/2010  . Coronary artery disease   . Hypokalemia   . Incontinence of urine   . Stromal tumor of the stomach   . Dyslipidemia 05/06/2007  . Essential hypertension 05/06/2007  . GERD 05/06/2007  . IRRITABLE BOWEL SYNDROME 05/06/2007   Past Medical History:  Diagnosis Date  . Arthritis   . Atrial fibrillation ([place])   . Bladder polyps   . Cancer ([place])    stomach  . Cataracts, bilateral   . Cervical polyp   . Chronic kidney disease     urinary incont-  . Coronary artery disease    minimal  . Dysrhythmia     AF  hx     dr Acie Fredrickson  . GERD (gastroesophageal reflux disease)   . Hiatal hernia   . Hyperlipidemia   . Hypertension   . Hypokalemia   . IBS (irritable bowel syndrome)   . IFG (impaired fasting glucose)   . Incontinence of urine   . MI, acute, non ST segment elevation ([place]) OZD.6,6440   cath  . OSA (obstructive sleep apnea)    hypoxia , not CO 2 retainer,   . OSA on CPAP 06/16/2013    Sleep study 1- 11-15 with an AHI of 61 , now titrated to CPAP 9 cm water with 2 cm EPR.   . Osteopenia   . Stromal tumor of the stomach    cancer    Family History  Problem Relation Age of Onset  . Pancreatic cancer Paternal Grandmother   . Heart disease Paternal Grandfather        both sides of the family  . Diabetes Paternal Aunt     Past Surgical History:  Procedure Laterality Date  . ANKLE FRACTURE SURGERY  2006   right, plate   . CARDIAC CATHETERIZATION  12/07/20210   Dr.Nahser,minor cad  . COLONOSCOPY    . COLONOSCOPY N/A 29-Mar-202016   Procedure: COLONOSCOPY;  Surgeon: Inda Castle, MD;  Location: [place];  Service: Endoscopy;  Laterality: N/A;  . ENTEROSCOPY N/A 29-Mar-202016   Procedure: ENTEROSCOPY;  Surgeon: Inda Castle, MD;  Location: [facility] ENDOSCOPY;  Service: Endoscopy;  Laterality: N/A;  . EYE SURGERY     both cataracts  . KNEE ARTHROSCOPY Right 05/28/2012   Procedure: RIGHT KNEE ARTHROSCOPY PARTIAL MEDIAL AND LATERAL MENISECTOMY WITH CHONDROPLASTY;  Surgeon: Alta Corning, MD;  Location: [place];  Service: Orthopedics;  Laterality: Right;  . stromal stomach  tumor  03/26/2007   resection  . TOTAL KNEE ARTHROPLASTY Right 08/10/2013   Procedure: TOTAL KNEE ARTHROPLASTY;  Surgeon: Alta Corning, MD;  Location: [facility];  Service: Orthopedics;  Laterality: Right;  . UPPER GI ENDOSCOPY     Social History   Occupational History  . Occupation: retired    Fish farm manager: RETIRED  Tobacco Use  .  Smoking status: Never Smoker  . Smokeless tobacco: Never Used  Substance and Sexual Activity  . Alcohol use: Yes    Alcohol/week: 1.2 oz    Types: 2 Glasses of wine per week    Comment: one per week  . Drug use: No  . Sexual activity: Never

## 2017-09-23 NOTE — Procedures (Signed)
Lumbosacral Transforaminal Epidural Steroid Injection - Sub-Pedicular Approach with Fluoroscopic Guidance  [name]      Date of Birth: 1935/02/17 MRN: [id number] PCP: Crist Infante, MD      Visit Date: 09/11/2017   Universal Protocol:    Date/Time: 09/11/2017  Consent Given By: the patient  Position: PRONE  Additional Comments: Vital signs were monitored before and after the procedure. Patient was prepped and draped in the usual sterile fashion. The correct patient, procedure, and site was verified.   Injection Procedure Details:  Procedure Site One Meds Administered:  Meds ordered this encounter  Medications  . betamethasone acetate-betamethasone sodium phosphate (CELESTONE) injection 12 mg    Laterality: Right  Location/Site:  L5-S1  Needle size: 22 G  Needle type: Spinal  Needle Placement: Transforaminal  Findings:    -Comments: Excellent flow of contrast along the nerve and into the epidural space.  Procedure Details: After squaring off the end-plates to get a true AP view, the C-arm was positioned so that an oblique view of the foramen as noted above was visualized. The target area is just inferior to the "nose of the scotty dog" or sub pedicular. The soft tissues overlying this structure were infiltrated with 2-3 ml. of 1% Lidocaine without Epinephrine.  The spinal needle was inserted toward the target using a "trajectory" view along the fluoroscope beam.  Under AP and lateral visualization, the needle was advanced so it did not puncture dura and was located close the 6 O'Clock position of the pedical in AP tracterory. Biplanar projections were used to confirm position. Aspiration was confirmed to be negative for CSF and/or blood. A 1-2 ml. volume of Isovue-250 was injected and flow of contrast was noted at each level. Radiographs were obtained for documentation purposes.   After attaining the desired flow of contrast documented above, a 0.5 to 1.0  ml test dose of 0.25% Marcaine was injected into each respective transforaminal space.  The patient was observed for 90 seconds post injection.  After no sensory deficits were reported, and normal lower extremity motor function was noted,   the above injectate was administered so that equal amounts of the injectate were placed at each foramen (level) into the transforaminal epidural space.   Additional Comments:  The patient tolerated the procedure well Dressing: Band-Aid    Post-procedure details: Patient was observed during the procedure. Post-procedure instructions were reviewed.  Patient left the clinic in stable condition.

## 2017-09-23 NOTE — Progress Notes (Signed)
[name] - 83 y.o. female MRN [id number]  Date of birth: Aug 27, 1934  Office Visit Note: Visit Date: 09/11/2017 PCP: Crist Infante, MD Referred by: Crist Infante, MD  Subjective: Chief Complaint  Patient presents with  . Lower Back - Pain  . Right Foot - Pain   HPI: [name] is a very pleasant 83 year old female who comes in today for planned right L5 transforaminal epidural steroid injection.  Further details and justification can be seen in her last office visit note.  Chiefly she had had 2 prior injections like this at [facility] both with pretty good flow of contrast however she did not get much relief with those.  The situation is the MRI does show clearly narrowing right at that level at that nerve and it does fit with some of her symptoms.  She unfortunately is a very poor historian with very poor short-term memory.  Since I have seen her she is followed up with and/or consulted with Dr. Meridee Score.  This was her looking in her right foot which she had a traumatic problem in the past that he did feel like there was traumatic arthritis.  He did complete an injection in the ankle I believe.  She does not remember the injection.  She feels like it could have happened but she does have pretty bad memory.  I will see her back in a few weeks post injection just to see if we can get an idea of how much this helped her did not help in the lumbar spine.   ROS Otherwise per HPI.  Assessment & Plan: Visit Diagnoses:  1. Lumbar radiculopathy   2. Spinal stenosis of lumbar region without neurogenic claudication     Plan: No additional findings.   Meds & Orders:  Meds ordered this encounter  Medications  . betamethasone acetate-betamethasone sodium phosphate (CELESTONE) injection 12 mg    Orders Placed This Encounter  Procedures  . XR C-ARM NO REPORT  . Epidural Steroid injection    Follow-up: Return in about 2 weeks (around 09/25/2017) for Recheck spine.    Procedures: No procedures performed  Lumbosacral Transforaminal Epidural Steroid Injection - Sub-Pedicular Approach with Fluoroscopic Guidance  [name]      Date of Birth: 1934/12/16 MRN: [id number] PCP: Crist Infante, MD      Visit Date: 09/11/2017   Universal Protocol:    Date/Time: 09/11/2017  Consent Given By: the patient  Position: PRONE  Additional Comments: Vital signs were monitored before and after the procedure. Patient was prepped and draped in the usual sterile fashion. The correct patient, procedure, and site was verified.   Injection Procedure Details:  Procedure Site One Meds Administered:  Meds ordered this encounter  Medications  . betamethasone acetate-betamethasone sodium phosphate (CELESTONE) injection 12 mg    Laterality: Right  Location/Site:  L5-S1  Needle size: 22 G  Needle type: Spinal  Needle Placement: Transforaminal  Findings:    -Comments: Excellent flow of contrast along the nerve and into the epidural space.  Procedure Details: After squaring off the end-plates to get a true AP view, the C-arm was positioned so that an oblique view of the foramen as noted above was visualized. The target area is just inferior to the "nose of the scotty dog" or sub pedicular. The soft tissues overlying this structure were infiltrated with 2-3 ml. of 1% Lidocaine without Epinephrine.  The spinal needle was inserted toward the target using a "trajectory" view along the fluoroscope  beam.  Under AP and lateral visualization, the needle was advanced so it did not puncture dura and was located close the 6 O'Clock position of the pedical in AP tracterory. Biplanar projections were used to confirm position. Aspiration was confirmed to be negative for CSF and/or blood. A 1-2 ml. volume of Isovue-250 was injected and flow of contrast was noted at each level. Radiographs were obtained for documentation purposes.   After attaining the desired flow  of contrast documented above, a 0.5 to 1.0 ml test dose of 0.25% Marcaine was injected into each respective transforaminal space.  The patient was observed for 90 seconds post injection.  After no sensory deficits were reported, and normal lower extremity motor function was noted,   the above injectate was administered so that equal amounts of the injectate were placed at each foramen (level) into the transforaminal epidural space.   Additional Comments:  The patient tolerated the procedure well Dressing: Band-Aid    Post-procedure details: Patient was observed during the procedure. Post-procedure instructions were reviewed.  Patient left the clinic in stable condition.    Clinical History: MRI LUMBAR SPINE WITHOUT CONTRAST  TECHNIQUE: Multiplanar, multisequence MR imaging of the lumbar spine was performed. No intravenous contrast was administered.  COMPARISON:  03/28/2010 CT abdomen and pelvis.  FINDINGS: Segmentation:  Standard.  Alignment: Grade 1 L5-S1 anterolisthesis and chronic L5 pars defects are stable from the prior CT of the abdomen and pelvis. Otherwise normal lumbar lordosis.  Vertebrae:  No fracture, evidence of discitis, or bone lesion.  Conus medullaris: Extends to the L1-2 level and appears normal.  Paraspinal and other soft tissues: Negative.  Disc levels:  L1-2: Minimal disc bulge and mild facet hypertrophy. Trace facet effusions. No significant foraminal narrowing or canal stenosis.  L2-3: Small disc bulge eccentric to the left with mild facet hypertrophy. Mild left-greater-than-right foraminal narrowing. No significant canal stenosis.  L3-4: Small disc bulge with mild facet and ligamentum flavum hypertrophy. Mild bilateral foraminal and lateral recess narrowing. No significant canal stenosis.  L4-5: Small disc bulge and central disc protrusion. Moderate facet hypertrophy. Mild bilateral recess narrowing. No significant foraminal  narrowing or canal stenosis.  L5-S1: Anterolisthesis with small uncovered disc bulge and advanced facet hypertrophy greater on the right. Severe right and moderate left foraminal narrowing. No significant canal stenosis.  IMPRESSION: 1. No acute osseous abnormality. 2. Grade 1 L5-S1 anterolisthesis and chronic L5 pars defects stable from 2012 CT given differences in technique. 3. Lumbar degenerative changes are greatest at the L5-S1 level where anterolisthesis combined with disc and facet disease results in severe right and moderate left foraminal narrowing.   Electronically Signed   By: Kristine Garbe M.D.   On: 08/27/2016 22:56   She reports that she has never smoked. She has never used smokeless tobacco. No results for input(s): HGBA1C, LABURIC in the last 8760 hours.  Objective:  VS:  HT:    WT:   BMI:     BP:(!) 142/75  HR:86bpm  TEMP: ( )  RESP:  Physical Exam  Ortho Exam Imaging: No results found.  Past Medical/Family/Surgical/Social History: Medications & Allergies reviewed per EMR, new medications updated. Patient Active Problem List   Diagnosis Date Noted  . Traumatic arthritis of ankle, right 08/12/2017  . History of non-ST elevation myocardial infarction (NSTEMI) 12/17/2016  . Chronic anticoagulation 12/17/2016  . Chest pain with moderate risk for cardiac etiology 05/30/2015  . Internal and external hemorrhoids without complication 03/50/0938  . History of GI bleed  06/24/2014  . Chronic diastolic heart failure ([place]) 06/24/2014  . Chronic atrial fibrillation ([place]) 06/24/2014  . Pre-syncope 05/05/2014  . Osteoarthritis of right knee 08/10/2013  . OSA on CPAP 06/16/2013  . Bradycardia 10/25/2010  . Fatigue 10/25/2010  . Dyspnea 09/18/2010  . Coronary artery disease   . Hypokalemia   . Incontinence of urine   . Stromal tumor of the stomach   . Dyslipidemia 05/06/2007  . Essential hypertension 05/06/2007  . GERD 05/06/2007  . IRRITABLE  BOWEL SYNDROME 05/06/2007   Past Medical History:  Diagnosis Date  . Arthritis   . Atrial fibrillation ([place])   . Bladder polyps   . Cancer ([place])    stomach  . Cataracts, bilateral   . Cervical polyp   . Chronic kidney disease    urinary incont-  . Coronary artery disease    minimal  . Dysrhythmia     AF  hx     dr Acie Fredrickson  . GERD (gastroesophageal reflux disease)   . Hiatal hernia   . Hyperlipidemia   . Hypertension   . Hypokalemia   . IBS (irritable bowel syndrome)   . IFG (impaired fasting glucose)   . Incontinence of urine   . MI, acute, non ST segment elevation ([place]) JGO.[id number]   cath  . OSA (obstructive sleep apnea)    hypoxia , not CO 2 retainer,   . OSA on CPAP 06/16/2013    Sleep study 1- 11-15 with an AHI of 61 , now titrated to CPAP 9 cm water with 2 cm EPR.   . Osteopenia   . Stromal tumor of the stomach    cancer   Family History  Problem Relation Age of Onset  . Pancreatic cancer Paternal Grandmother   . Heart disease Paternal Grandfather        both sides of the family  . Diabetes Paternal Aunt    Past Surgical History:  Procedure Laterality Date  . ANKLE FRACTURE SURGERY  2006   right, plate   . CARDIAC CATHETERIZATION  12/07/20210   Dr.Nahser,minor cad  . COLONOSCOPY    . COLONOSCOPY N/A 2020-12-2114   Procedure: COLONOSCOPY;  Surgeon: Inda Castle, MD;  Location: [facility];  Service: Endoscopy;  Laterality: N/A;  . ENTEROSCOPY N/A 2020-12-2114   Procedure: ENTEROSCOPY;  Surgeon: Inda Castle, MD;  Location: [facility] ENDOSCOPY;  Service: Endoscopy;  Laterality: N/A;  . EYE SURGERY     both cataracts  . KNEE ARTHROSCOPY Right 05/28/2012   Procedure: RIGHT KNEE ARTHROSCOPY PARTIAL MEDIAL AND LATERAL MENISECTOMY WITH CHONDROPLASTY;  Surgeon: Alta Corning, MD;  Location: [facility];  Service: Orthopedics;  Laterality: Right;  . stromal stomach  tumor  03/26/2007   resection  . TOTAL KNEE ARTHROPLASTY Right 08/10/2013   Procedure: TOTAL  KNEE ARTHROPLASTY;  Surgeon: Alta Corning, MD;  Location: [facility];  Service: Orthopedics;  Laterality: Right;  . UPPER GI ENDOSCOPY     Social History   Occupational History  . Occupation: retired    Fish farm manager: RETIRED  Tobacco Use  . Smoking status: Never Smoker  . Smokeless tobacco: Never Used  Substance and Sexual Activity  . Alcohol use: Yes    Alcohol/week: 1.2 oz    Types: 2 Glasses of wine per week    Comment: one per week  . Drug use: No  . Sexual activity: Never

## 2017-09-25 ENCOUNTER — Encounter (INDEPENDENT_AMBULATORY_CARE_PROVIDER_SITE_OTHER): Payer: Self-pay | Admitting: Physical Medicine and Rehabilitation

## 2017-09-25 ENCOUNTER — Ambulatory Visit (INDEPENDENT_AMBULATORY_CARE_PROVIDER_SITE_OTHER): Payer: Medicare Other | Admitting: Physical Medicine and Rehabilitation

## 2017-09-25 VITALS — BP 125/69 | HR 81 | Temp 97.8°F | Ht 64.5 in | Wt 130.0 lb

## 2017-09-25 DIAGNOSIS — M25571 Pain in right ankle and joints of right foot: Secondary | ICD-10-CM

## 2017-09-25 DIAGNOSIS — M5416 Radiculopathy, lumbar region: Secondary | ICD-10-CM | POA: Diagnosis not present

## 2017-09-25 DIAGNOSIS — M48061 Spinal stenosis, lumbar region without neurogenic claudication: Secondary | ICD-10-CM | POA: Diagnosis not present

## 2017-09-25 NOTE — Progress Notes (Signed)
 .  Numeric Pain Rating Scale and Functional Assessment Average Pain 5 Pain Right Now 4 My pain is constant, dull and aching Pain is worse with: walking and standing Pain improves with: rest   In the last MONTH (on 0-10 scale) has pain interfered with the following?  1. General activity like being  able to carry out your everyday physical activities such as walking, climbing stairs, carrying groceries, or moving a chair?  Rating(5)  2. Relation with others like being able to carry out your usual social activities and roles such as  activities at home, at work and in your community. Rating(1)  3. Enjoyment of life such that you have  been bothered by emotional problems such as feeling anxious, depressed or irritable?  Rating(0)

## 2017-10-15 ENCOUNTER — Encounter (INDEPENDENT_AMBULATORY_CARE_PROVIDER_SITE_OTHER): Payer: Self-pay | Admitting: Physical Medicine and Rehabilitation

## 2017-10-15 NOTE — Progress Notes (Signed)
[name] - 83 y.o. female MRN [id number]  Date of birth: [birth year]  Office Visit Note: Visit Date: 09/25/2017 PCP: Crist Infante, MD Referred by: Crist Infante, MD  Subjective: Chief Complaint  Patient presents with  . Lower Back - Pain   HPI: [name] is an 83 year old female that we have seen on a few occasions now for her back and right hip and leg pain.  She is somewhat of a difficult historian at times and does have some mild memory deficit.  We decided to complete a transforaminal epidural steroid injection at L5.  This is the same injection that was completed twice by [facility] imaging before I saw her they gave her no relief according to her history.  Her symptoms have been pretty classic L5 symptoms and there is foraminal narrowing such that the L5 nerve root seems to be the source of the pain on the MRI judging from that and her symptoms.  She continues to have similar complaints but she says overall she is 50 to 60% better after the injection.  We tried a prior facet joint blocks that gave her no relief.  She is done well with the injection and while it is still present she is fairly happy with the results.  She continues with her current medications.  She uses Tylenol mainly for pain.  She uses anti-inflammatory sparingly which we did discuss.  She continues to have right ankle pain that she feels is different than the pain that she gets from the back.  She has seen Dr. do that he has completed an injection in the ankle without much relief.  She is following up with him for possible second injection.  She does have a traumatic right ankle arthritis.   Review of Systems  Constitutional: Negative for chills, fever, malaise/fatigue and weight loss.  HENT: Negative for hearing loss and sinus pain.   Eyes: Negative for blurred vision, double vision and photophobia.  Respiratory: Negative for cough and shortness of breath.   Cardiovascular: Negative for chest pain, palpitations  and leg swelling.  Gastrointestinal: Negative for abdominal pain, nausea and vomiting.  Genitourinary: Negative for flank pain.  Musculoskeletal: Positive for back pain and joint pain. Negative for myalgias.  Skin: Negative for itching and rash.  Neurological: Negative for tremors, focal weakness and weakness.  Endo/Heme/Allergies: Negative.   Psychiatric/Behavioral: Negative for depression.  All other systems reviewed and are negative.  Otherwise per HPI.  Assessment & Plan: Visit Diagnoses:  1. Lumbar radiculopathy   2. Spinal stenosis of lumbar region without neurogenic claudication   3. Pain in right ankle and joints of right foot     Plan: Findings:  60% improvement after right L5 transforaminal epidural steroid injection.  This fits with her MRI findings but is interesting that the prior injections by [facility] imaging were not very successful at least by her report.  She is somewhat of a difficult historian with some memory loss.  Those injections again looked very well placed but again according to her did not help.  She is fairly happy right now with her back pain although it still present.  I would repeat this injection so to start to return.  She will continue to follow-up with Dr. Sharol Given for her right ankle.  She will continue on her medication for pain relief which is currently Tylenol.  Would consider adjunctive type medication in the future.    Meds & Orders: No orders of the defined types were placed  in this encounter.  No orders of the defined types were placed in this encounter.   Follow-up: Return if symptoms worsen or fail to improve.   Procedures: No procedures performed  No notes on file   Clinical History: MRI LUMBAR SPINE WITHOUT CONTRAST  TECHNIQUE: Multiplanar, multisequence MR imaging of the lumbar spine was performed. No intravenous contrast was administered.  COMPARISON:  03/28/2010 CT abdomen and pelvis.  FINDINGS: Segmentation:   Standard.  Alignment: Grade 1 L5-S1 anterolisthesis and chronic L5 pars defects are stable from the prior CT of the abdomen and pelvis. Otherwise normal lumbar lordosis.  Vertebrae:  No fracture, evidence of discitis, or bone lesion.  Conus medullaris: Extends to the L1-2 level and appears normal.  Paraspinal and other soft tissues: Negative.  Disc levels:  L1-2: Minimal disc bulge and mild facet hypertrophy. Trace facet effusions. No significant foraminal narrowing or canal stenosis.  L2-3: Small disc bulge eccentric to the left with mild facet hypertrophy. Mild left-greater-than-right foraminal narrowing. No significant canal stenosis.  L3-4: Small disc bulge with mild facet and ligamentum flavum hypertrophy. Mild bilateral foraminal and lateral recess narrowing. No significant canal stenosis.  L4-5: Small disc bulge and central disc protrusion. Moderate facet hypertrophy. Mild bilateral recess narrowing. No significant foraminal narrowing or canal stenosis.  L5-S1: Anterolisthesis with small uncovered disc bulge and advanced facet hypertrophy greater on the right. Severe right and moderate left foraminal narrowing. No significant canal stenosis.  IMPRESSION: 1. No acute osseous abnormality. 2. Grade 1 L5-S1 anterolisthesis and chronic L5 pars defects stable from 2012 CT given differences in technique. 3. Lumbar degenerative changes are greatest at the L5-S1 level where anterolisthesis combined with disc and facet disease results in severe right and moderate left foraminal narrowing.   Electronically Signed   By: Kristine Garbe M.D.   On: 08/27/2016 22:56   She reports that she has never smoked. She has never used smokeless tobacco. No results for input(s): HGBA1C, LABURIC in the last 8760 hours.  Objective:  VS:  HT:5' 4.5" (163.8 cm)   WT:130 lb (59 kg)  BMI:21.98    BP:125/69  HR:81bpm  TEMP:97.8 F (36.6 C)(Oral)  RESP:97 % Physical  Exam  Constitutional: She is oriented to person, place, and time. She appears well-developed and well-nourished.  Eyes: Pupils are equal, round, and reactive to light. Conjunctivae and EOM are normal.  Cardiovascular: Normal rate and intact distal pulses.  Pulmonary/Chest: Effort normal.  Musculoskeletal:  Patient is somewhat slow to rise from a seated position with full extension of the lumbar spine.  No pain over the greater trochanters or no pain with hip rotation.  She has good distal strength.  She does have some stiffness of the right ankle with active range of motion.  She has no clonus.  Neurological: She is alert and oriented to person, place, and time. She exhibits normal muscle tone.  Skin: Skin is warm and dry. No rash noted. No erythema.  Psychiatric: She has a normal mood and affect. Her behavior is normal.  Nursing note and vitals reviewed.   Ortho Exam Imaging: No results found.  Past Medical/Family/Surgical/Social History: Medications & Allergies reviewed per EMR, new medications updated. Patient Active Problem List   Diagnosis Date Noted  . Traumatic arthritis of ankle, right 08/12/2017  . History of non-ST elevation myocardial infarction (NSTEMI) 12/17/2016  . Chronic anticoagulation 12/17/2016  . Chest pain with moderate risk for cardiac etiology 05/30/2015  . Internal and external hemorrhoids without complication 00/92/3300  .  History of GI bleed 06/24/2014  . Chronic diastolic heart failure ([place]) 06/24/2014  . Chronic atrial fibrillation ([place]) 06/24/2014  . Pre-syncope 05/05/2014  . Osteoarthritis of right knee 08/10/2013  . OSA on CPAP 06/16/2013  . Bradycardia 10/25/2010  . Fatigue 10/25/2010  . Dyspnea 09/18/2010  . Coronary artery disease   . Hypokalemia   . Incontinence of urine   . Stromal tumor of the stomach   . Dyslipidemia 05/06/2007  . Essential hypertension 05/06/2007  . GERD 05/06/2007  . IRRITABLE BOWEL SYNDROME 05/06/2007   Past Medical  History:  Diagnosis Date  . Arthritis   . Atrial fibrillation ([place])   . Bladder polyps   . Cancer ([place])    stomach  . Cataracts, bilateral   . Cervical polyp   . Chronic kidney disease    urinary incont-  . Coronary artery disease    minimal  . Dysrhythmia     AF  hx     dr Acie Fredrickson  . GERD (gastroesophageal reflux disease)   . Hiatal hernia   . Hyperlipidemia   . Hypertension   . Hypokalemia   . IBS (irritable bowel syndrome)   . IFG (impaired fasting glucose)   . Incontinence of urine   . MI, acute, non ST segment elevation ([place]) [id number]   cath  . OSA (obstructive sleep apnea)    hypoxia , not CO 2 retainer,   . OSA on CPAP 06/16/2013    Sleep study 1- 11-15 with an AHI of 61 , now titrated to CPAP 9 cm water with 2 cm EPR.   . Osteopenia   . Stromal tumor of the stomach    cancer   Family History  Problem Relation Age of Onset  . Pancreatic cancer Paternal Grandmother   . Heart disease Paternal Grandfather        both sides of the family  . Diabetes Paternal Aunt    Past Surgical History:  Procedure Laterality Date  . ANKLE FRACTURE SURGERY  2006   right, plate   . CARDIAC CATHETERIZATION  12/07/20210   Dr.Nahser,minor cad  . COLONOSCOPY    . COLONOSCOPY N/A 12-21-2014   Procedure: COLONOSCOPY;  Surgeon: Inda Castle, MD;  Location: [place];  Service: Endoscopy;  Laterality: N/A;  . ENTEROSCOPY N/A 12-21-2014   Procedure: ENTEROSCOPY;  Surgeon: Inda Castle, MD;  Location: [facility] ENDOSCOPY;  Service: Endoscopy;  Laterality: N/A;  . EYE SURGERY     both cataracts  . KNEE ARTHROSCOPY Right 05/28/2012   Procedure: RIGHT KNEE ARTHROSCOPY PARTIAL MEDIAL AND LATERAL MENISECTOMY WITH CHONDROPLASTY;  Surgeon: Alta Corning, MD;  Location: [place];  Service: Orthopedics;  Laterality: Right;  . stromal stomach  tumor  03/26/2007   resection  . TOTAL KNEE ARTHROPLASTY Right 08/10/2013   Procedure: TOTAL KNEE ARTHROPLASTY;  Surgeon: Alta Corning,  MD;  Location: [place];  Service: Orthopedics;  Laterality: Right;  . UPPER GI ENDOSCOPY     Social History   Occupational History  . Occupation: retired    Fish farm manager: RETIRED  Tobacco Use  . Smoking status: Never Smoker  . Smokeless tobacco: Never Used  Substance and Sexual Activity  . Alcohol use: Yes    Alcohol/week: 2.0 standard drinks    Types: 2 Glasses of wine per week    Comment: one per week  . Drug use: No  . Sexual activity: Never

## 2017-10-17 ENCOUNTER — Encounter (INDEPENDENT_AMBULATORY_CARE_PROVIDER_SITE_OTHER): Payer: Self-pay | Admitting: Physical Medicine and Rehabilitation

## 2017-10-17 NOTE — Progress Notes (Signed)
[name] - 83 y.o. female MRN [id number]  Date of birth: Nov 28, 1934  Office Visit Note: Visit Date: 08/21/2017 PCP: Crist Infante, MD Referred by: Crist Infante, MD  Subjective: Chief Complaint  Patient presents with  . Lower Back - Pain  . Right Ankle - Pain   HPI: [name] is a very pleasant 83 year old female with chronic right low back and hip pain with referral in the leg and a pretty classic L5 distribution that has not really been relieved with prior L5 transforaminal injections by [facility] imaging.  We completed facet joint block which was less beneficial as well.  She has had continued right ankle pain from traumatic ankle fracture in the past and is followed up with Dr. Sharol Given.  She comes in today feeling like her back pain has improved since we sent her to physical therapy.  She is noticed that she is gotten stronger.  She also feels like there is more pain now with sitting.  Pain is again is again worse on the right lower back and buttock area.  She feels like the right ankle pain is actually more of a problem than her back pain at this point.  She reports her average pain is 5 out of 10 in its intermittent dull and aching pain worse with sitting and somewhat better with rest and just distraction.  It does really limit what she can do during the day in terms of physical activities and it actually keeps her from doing a lot of stuff she like to do from a social standpoint because of the pain.  Medications have really not been very helpful.  She has not had specific physical therapy for her ankle.  Dr. 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  He did complete an injection in the ankle.  The patient is really unsure does not seem to remember if he gave her an injection.  She has somewhat of a poor historian with some memory difficulties.  She has had no new falls or trauma since I have seen her.   Review of Systems    Constitutional: Negative for chills, fever, malaise/fatigue and weight loss.  HENT: Negative for hearing loss and sinus pain.   Eyes: Negative for blurred vision, double vision and photophobia.  Respiratory: Negative for cough and shortness of breath.   Cardiovascular: Negative for chest pain, palpitations and leg swelling.  Gastrointestinal: Negative for abdominal pain, nausea and vomiting.  Genitourinary: Negative for flank pain.  Musculoskeletal: Positive for back pain and joint pain. Negative for myalgias.  Skin: Negative for itching and rash.  Neurological: Negative for tremors, focal weakness and weakness.  Endo/Heme/Allergies: Negative.   Psychiatric/Behavioral: Positive for memory loss. Negative for depression.  All other systems reviewed and are negative.  Otherwise per HPI.  Assessment & Plan: Visit Diagnoses:  1. Lumbar radiculopathy   2. Spondylolisthesis of lumbar region   3. Spondylosis without myelopathy or radiculopathy, lumbar region   4. Chronic pain syndrome     Plan: Findings:  1.  Chronic low back pain which is right-sided with some referral in the hip and leg in L5 distribution.  She has foraminal narrowing that seems to impact the L5 nerve root on the images.  Prior L5 transforaminal injections but agrees were imaging at least but her memory were not very successful.  She is somewhat of a poor historian with some memory difficulties.  Is hard to understand what her goal at the time was which could have been 100% pain relief in the shot only helped 50% which is still something we would consider fairly good.  I think at this point at least one time and I discussed this with her at great length today is that I do want to try a repeat of the L5 transforaminal injection and just see because this makes more sense with her back pain then really anything else.  She does have facet arthropathy with facet joint block was not beneficial.  We can always look at different  medication trial as well.  She has had physical therapy now.  She does feel a little bit better with therapy.  Other alternatives would be something like spinal cord stimulator trial but I am not sure she would do well with that in terms of managing the device.  She does not want surgery.  2.  Right ankle pain from traumatic issues in the past and traumatic arthritis.  She is followed by Dr. Sharol Given for this.  He is planning on repeating the injection.  We will continue to watch along with him.    Meds & Orders:  Meds ordered this encounter  Medications  . DISCONTD: traMADol (ULTRAM) 50 MG tablet    Sig: Take 1 tablet (50 mg total) by mouth every 12 (twelve) hours as needed for up to 5 days for moderate pain. Strt with 1/2 tab    Dispense:  20 tablet    Refill:  0   No orders of the defined types were placed in this encounter.   Follow-up: Return for Right L5 transforaminal epidural steroid injection.   Procedures: No procedures performed  No notes on file   Clinical History: MRI LUMBAR SPINE WITHOUT CONTRAST  TECHNIQUE: Multiplanar, multisequence MR imaging of the lumbar spine was performed. No intravenous contrast was administered.  COMPARISON:  03/28/2010 CT abdomen and pelvis.  FINDINGS: Segmentation:  Standard.  Alignment: Grade 1 L5-S1 anterolisthesis and chronic L5 pars defects are stable from the prior CT of the abdomen and pelvis. Otherwise normal lumbar lordosis.  Vertebrae:  No fracture, evidence of discitis, or bone lesion.  Conus medullaris: Extends to the L1-2 level and appears normal.  Paraspinal and other soft tissues: Negative.  Disc levels:  L1-2: Minimal disc bulge and mild facet hypertrophy. Trace facet effusions. No significant foraminal narrowing or canal stenosis.  L2-3: Small disc bulge eccentric to the left with mild facet hypertrophy. Mild left-greater-than-right foraminal narrowing. No significant canal stenosis.  L3-4: Small disc  bulge with mild facet and ligamentum flavum hypertrophy. Mild bilateral foraminal and lateral recess narrowing. No significant canal stenosis.  L4-5: Small disc bulge and central disc protrusion. Moderate facet hypertrophy. Mild bilateral recess narrowing. No significant foraminal narrowing or canal stenosis.  L5-S1: Anterolisthesis with small uncovered disc bulge and advanced facet hypertrophy greater on the right. Severe right and moderate left foraminal narrowing. No significant canal stenosis.  IMPRESSION: 1. No acute osseous abnormality. 2. Grade 1 L5-S1 anterolisthesis and chronic L5 pars defects stable from 2012 CT given differences in technique. 3. Lumbar degenerative changes are greatest at the L5-S1 level where anterolisthesis combined with disc and facet disease results in severe right and moderate left foraminal narrowing.   Electronically Signed   By: Kristine Garbe M.D.   On: 08/27/2016 22:56   She reports that she has never smoked. She has never used smokeless tobacco. No results for input(s): HGBA1C, LABURIC in  the last 8760 hours.  Objective:  VS:  HT:    WT:   BMI:     BP:121/73  HR:85bpm  TEMP: ( )  RESP:  Physical Exam  Constitutional: She is oriented to person, place, and time. She appears well-developed and well-nourished.  Eyes: Pupils are equal, round, and reactive to light. Conjunctivae and EOM are normal.  Cardiovascular: Normal rate and intact distal pulses.  Pulmonary/Chest: Effort normal.  Musculoskeletal:  Patient has difficulty going from sit to stand and does have pain with extension of the lumbar spine and facet joint loading concordant back pain.  She has a negative slump test bilaterally.  She does have some pain over the PSIS but no focal trigger points are taut bands.  No pain over the greater trochanters.  No pain with hip rotation.  She has good distal strength bilaterally.  She does have lack of range of motion on the  right ankle.  Neurological: She is alert and oriented to person, place, and time. She exhibits normal muscle tone. Coordination normal.  Skin: Skin is warm and dry. No rash noted. No erythema.  Psychiatric: She has a normal mood and affect. Her behavior is normal.  Nursing note and vitals reviewed.   Ortho Exam Imaging: No results found.  Past Medical/Family/Surgical/Social History: Medications & Allergies reviewed per EMR, new medications updated. Patient Active Problem List   Diagnosis Date Noted  . Traumatic arthritis of ankle, right 08/12/2017  . History of non-ST elevation myocardial infarction (NSTEMI) 12/17/2016  . Chronic anticoagulation 12/17/2016  . Chest pain with moderate risk for cardiac etiology 05/30/2015  . Internal and external hemorrhoids without complication 68/34/1962  . History of GI bleed 06/24/2014  . Chronic diastolic heart failure ([place]) 06/24/2014  . Chronic atrial fibrillation ([place]) 06/24/2014  . Pre-syncope 05/05/2014  . Osteoarthritis of right knee 08/10/2013  . OSA on CPAP 06/16/2013  . Bradycardia 10/25/2010  . Fatigue 10/25/2010  . Dyspnea 09/18/2010  . Coronary artery disease   . Hypokalemia   . Incontinence of urine   . Stromal tumor of the stomach   . Dyslipidemia 05/06/2007  . Essential hypertension 05/06/2007  . GERD 05/06/2007  . IRRITABLE BOWEL SYNDROME 05/06/2007   Past Medical History:  Diagnosis Date  . Arthritis   . Atrial fibrillation ([place])   . Bladder polyps   . Cancer ([place])    stomach  . Cataracts, bilateral   . Cervical polyp   . Chronic kidney disease    urinary incont-  . Coronary artery disease    minimal  . Dysrhythmia     AF  hx     dr Acie Fredrickson  . GERD (gastroesophageal reflux disease)   . Hiatal hernia   . Hyperlipidemia   . Hypertension   . Hypokalemia   . IBS (irritable bowel syndrome)   . IFG (impaired fasting glucose)   . Incontinence of urine   . MI, acute, non ST segment elevation ([place]) [id number]    cath  . OSA (obstructive sleep apnea)    hypoxia , not CO 2 retainer,   . OSA on CPAP 06/16/2013    Sleep study 1- 11-15 with an AHI of 61 , now titrated to CPAP 9 cm water with 2 cm EPR.   . Osteopenia   . Stromal tumor of the stomach    cancer   Family History  Problem Relation Age of Onset  . Pancreatic cancer Paternal Grandmother   . Heart disease Paternal Grandfather  both sides of the family  . Diabetes Paternal Aunt    Past Surgical History:  Procedure Laterality Date  . ANKLE FRACTURE SURGERY  2006   right, plate   . CARDIAC CATHETERIZATION  12/07/20210   Dr.Nahser,minor cad  . COLONOSCOPY    . COLONOSCOPY N/A 12-17-2014   Procedure: COLONOSCOPY;  Surgeon: Inda Castle, MD;  Location: [place];  Service: Endoscopy;  Laterality: N/A;  . ENTEROSCOPY N/A 12-17-2014   Procedure: ENTEROSCOPY;  Surgeon: Inda Castle, MD;  Location: [facility] ENDOSCOPY;  Service: Endoscopy;  Laterality: N/A;  . EYE SURGERY     both cataracts  . KNEE ARTHROSCOPY Right 05/28/2012   Procedure: RIGHT KNEE ARTHROSCOPY PARTIAL MEDIAL AND LATERAL MENISECTOMY WITH CHONDROPLASTY;  Surgeon: Alta Corning, MD;  Location: [place];  Service: Orthopedics;  Laterality: Right;  . stromal stomach  tumor  03/26/2007   resection  . TOTAL KNEE ARTHROPLASTY Right 08/10/2013   Procedure: TOTAL KNEE ARTHROPLASTY;  Surgeon: Alta Corning, MD;  Location: [place];  Service: Orthopedics;  Laterality: Right;  . UPPER GI ENDOSCOPY     Social History   Occupational History  . Occupation: retired    Fish farm manager: RETIRED  Tobacco Use  . Smoking status: Never Smoker  . Smokeless tobacco: Never Used  Substance and Sexual Activity  . Alcohol use: Yes    Alcohol/week: 2.0 standard drinks    Types: 2 Glasses of wine per week    Comment: one per week  . Drug use: No  . Sexual activity: Never

## 2017-11-12 ENCOUNTER — Encounter (INDEPENDENT_AMBULATORY_CARE_PROVIDER_SITE_OTHER): Payer: Self-pay | Admitting: Physical Medicine and Rehabilitation

## 2017-11-12 ENCOUNTER — Telehealth (INDEPENDENT_AMBULATORY_CARE_PROVIDER_SITE_OTHER): Payer: Self-pay | Admitting: *Deleted

## 2017-11-12 ENCOUNTER — Ambulatory Visit (INDEPENDENT_AMBULATORY_CARE_PROVIDER_SITE_OTHER): Payer: Medicare Other | Admitting: Physical Medicine and Rehabilitation

## 2017-11-12 VITALS — BP 117/68 | HR 85 | Ht 64.5 in | Wt 135.0 lb

## 2017-11-12 DIAGNOSIS — M25571 Pain in right ankle and joints of right foot: Secondary | ICD-10-CM

## 2017-11-12 DIAGNOSIS — M5416 Radiculopathy, lumbar region: Secondary | ICD-10-CM | POA: Diagnosis not present

## 2017-11-12 DIAGNOSIS — M4317 Spondylolisthesis, lumbosacral region: Secondary | ICD-10-CM

## 2017-11-12 DIAGNOSIS — M4306 Spondylolysis, lumbar region: Secondary | ICD-10-CM

## 2017-11-12 MED ORDER — DULOXETINE HCL 20 MG PO CPEP: 20.0000 mg | ORAL_CAPSULE | Freq: Every day | ORAL | 1 refills | Status: DC

## 2017-11-12 MED ORDER — DICLOFENAC SODIUM 1 % TD GEL: 4.0000 g | Freq: Four times a day (QID) | TRANSDERMAL | 0 refills | Status: DC

## 2017-11-12 NOTE — Progress Notes (Signed)
.  Numeric Pain Rating Scale and Functional Assessment Average Pain 4 Pain Right Now 3 My pain is intermittent and dull Pain is worse with: walking and some activites Pain improves with: rest   In the last MONTH (on 0-10 scale) has pain interfered with the following?  1. General activity like being  able to carry out your everyday physical activities such as walking, climbing stairs, carrying groceries, or moving a chair?  Rating(5)  2. Relation with others like being able to carry out your usual social activities and roles such as  activities at home, at work and in your community. Rating(0)  3. Enjoyment of life such that you have  been bothered by emotional problems such as feeling anxious, depressed or irritable?  Rating(0)

## 2017-11-12 NOTE — Telephone Encounter (Signed)
Per [employer] members plan does not currently require a prior authorization for [phone].

## 2017-11-22 ENCOUNTER — Ambulatory Visit (INDEPENDENT_AMBULATORY_CARE_PROVIDER_SITE_OTHER): Payer: Self-pay

## 2017-11-22 ENCOUNTER — Ambulatory Visit (INDEPENDENT_AMBULATORY_CARE_PROVIDER_SITE_OTHER): Payer: Medicare Other | Admitting: Physical Medicine and Rehabilitation

## 2017-11-22 ENCOUNTER — Encounter (INDEPENDENT_AMBULATORY_CARE_PROVIDER_SITE_OTHER): Payer: Self-pay | Admitting: Physical Medicine and Rehabilitation

## 2017-11-22 VITALS — BP 115/66 | HR 81 | Temp 97.5°F

## 2017-11-22 DIAGNOSIS — M5416 Radiculopathy, lumbar region: Secondary | ICD-10-CM

## 2017-11-22 DIAGNOSIS — M5116 Intervertebral disc disorders with radiculopathy, lumbar region: Secondary | ICD-10-CM

## 2017-11-22 MED ORDER — BETAMETHASONE SOD PHOS & ACET 6 (3-3) MG/ML IJ SUSP
12.0000 mg | Freq: Once | INTRAMUSCULAR | Status: AC
  Administered 2017-11-22: 12 mg

## 2017-11-22 NOTE — Patient Instructions (Signed)

## 2017-11-22 NOTE — Progress Notes (Signed)
[name] - 83 y.o. female MRN [id number]  Date of birth: [birth year]  Office Visit Note: Visit Date: 11/12/2017 PCP: Crist Infante, MD Referred by: Crist Infante, MD  Subjective: Chief Complaint  Patient presents with  . Right Hip - Pain  . Right Knee - Pain  . Right Lower Leg - Pain  . Right Ankle - Pain   HPI: [name] is a very pleasant 83 year old female who we have seen now on a few occasions for right hip and leg pain and also for ankle pain.  We referred her to Dr. 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  She has not responded well to injection by him.  She really does not feel like she is going to go back and see him at this point she has not gotten much relief from the last injection was very painful.  In terms of her right hip and leg, she did extremely well with a right L5 transforaminal epidural steroid injection.  Prior injection in [place] imaging was not very beneficial but evidently this one was very beneficial for a while.  She is a very poor historian with memory difficulties which I fear may be worsening.  Her primary care physician is Dr. Crist Infante and this note will get to him.  She does drive herself here today.  She does not come with a family member.  Her biggest pain is the right hip and thigh and lower leg.  It does seem to be more of an L5 distribution.  She is very confused about seeing Dr. Sharol Given and myself in terms of which treatment was helping or not helping.  We spent a considerable amount of time today rehashing her course with [place] and what is been done and why.  She seemingly forget some of this even by the end of the interview.  She has no left-sided pain no new trauma or falls.  No focal weakness.  She gets somewhat agitated but the fact that we had been trying to nail down what is helped and what is not helped and logically trying to get to an endpoint.  Her case is further complicated by anticoagulation.   Review of Systems    Constitutional: Negative for chills, fever, malaise/fatigue and weight loss.  HENT: Negative for hearing loss and sinus pain.   Eyes: Negative for blurred vision, double vision and photophobia.  Respiratory: Negative for cough and shortness of breath.   Cardiovascular: Negative for chest pain, palpitations and leg swelling.  Gastrointestinal: Negative for abdominal pain, nausea and vomiting.  Genitourinary: Negative for flank pain.  Musculoskeletal: Positive for back pain and joint pain. Negative for myalgias.  Skin: Negative for itching and rash.  Neurological: Negative for tremors, focal weakness and weakness.  Endo/Heme/Allergies: Negative.   Psychiatric/Behavioral: Positive for memory loss. Negative for depression.  All other systems reviewed and are negative.  Otherwise per HPI.  Assessment & Plan: Visit Diagnoses:  1. Lumbar radiculopathy   2. Spondylolisthesis of lumbosacral region   3. Spondylolysis, lumbar region   4. Pain in right ankle and joints of right foot     Plan: Findings:  Chronic right sided hip and leg pain that seems to be consistent with an L5 radiculitis radiculopathy.  MRI evidence consistent with this and at least one diagnostic L5 transforaminal injection in our office gave her quite a bit of relief.  Facet joint block gave her some relief of the back pain but not any  of the leg pain and it was very temporary.  Prior injections by [facility] imaging did not seem to help very much but again she has a lot of memory difficulties.  She is on medication for early memory difficulties.  She has not gotten much better through treatment with Dr. Sharol Given for her right traumatic arthritic ankle.  She may wish to seek referral to a another foot and ankle doctor or podiatrist.  She can speak to her primary care physician about this as well.  I feel the Dr. Sharol Given is probably the right choice for looking at this but is just not something she is been helped with at this point and it  may not be something she can be helped with short of surgery which she does not want to do.  In terms of her right hip and leg I think we should repeat the L5 injection as it did give her a few months of relief and she is better now that she was only first saw her and this is throughout her documentation and just seeing her clinically.  The memory difficulty is a problem.  I also want to start her on Cymbalta at a very low dose.  I do not think this will interact with any of her current medications that I looked at and its a pretty mild medication overall.  This may help with some of the pain aspects of both the ankle and the hip and leg.  We will start slow and work her way up and she will continue to follow with her primary care physician.  I also will prescribe Voltaren gel for her ankle which is a topical anti-inflammatory and she should be able to take that even on the blood thinner.    Meds & Orders:  Meds ordered this encounter  Medications  . DULoxetine (CYMBALTA) 20 MG capsule    Sig: Take 1 capsule (20 mg total) by mouth daily. For 7 nights then take 2 capsules daily at night    Dispense:  60 capsule    Refill:  1  . diclofenac sodium (VOLTAREN) 1 % GEL    Sig: Apply 4 g topically 4 (four) times daily.    Dispense:  5 Tube    Refill:  0   No orders of the defined types were placed in this encounter.   Follow-up: Return for right L5 TF esi.   Procedures: No procedures performed  No notes on file   Clinical History: MRI LUMBAR SPINE WITHOUT CONTRAST  TECHNIQUE: Multiplanar, multisequence MR imaging of the lumbar spine was performed. No intravenous contrast was administered.  COMPARISON:  03/28/2010 CT abdomen and pelvis.  FINDINGS: Segmentation:  Standard.  Alignment: Grade 1 L5-S1 anterolisthesis and chronic L5 pars defects are stable from the prior CT of the abdomen and pelvis. Otherwise normal lumbar lordosis.  Vertebrae:  No fracture, evidence of discitis, or  bone lesion.  Conus medullaris: Extends to the L1-2 level and appears normal.  Paraspinal and other soft tissues: Negative.  Disc levels:  L1-2: Minimal disc bulge and mild facet hypertrophy. Trace facet effusions. No significant foraminal narrowing or canal stenosis.  L2-3: Small disc bulge eccentric to the left with mild facet hypertrophy. Mild left-greater-than-right foraminal narrowing. No significant canal stenosis.  L3-4: Small disc bulge with mild facet and ligamentum flavum hypertrophy. Mild bilateral foraminal and lateral recess narrowing. No significant canal stenosis.  L4-5: Small disc bulge and central disc protrusion. Moderate facet hypertrophy. Mild bilateral recess narrowing.  No significant foraminal narrowing or canal stenosis.  L5-S1: Anterolisthesis with small uncovered disc bulge and advanced facet hypertrophy greater on the right. Severe right and moderate left foraminal narrowing. No significant canal stenosis.  IMPRESSION: 1. No acute osseous abnormality. 2. Grade 1 L5-S1 anterolisthesis and chronic L5 pars defects stable from 2012 CT given differences in technique. 3. Lumbar degenerative changes are greatest at the L5-S1 level where anterolisthesis combined with disc and facet disease results in severe right and moderate left foraminal narrowing.   Electronically Signed   By: Kristine Garbe M.D.   On: 08/27/2016 22:56   She reports that she has never smoked. She has never used smokeless tobacco. No results for input(s): HGBA1C, LABURIC in the last 8760 hours.  Objective:  VS:  HT:5' 4.5" (163.8 cm)   WT:135 lb (61.2 kg)  BMI:22.82    BP:117/68  HR:85bpm  TEMP: ( )  RESP:  Physical Exam  Constitutional: She is oriented to person, place, and time. She appears well-developed and well-nourished. No distress.  HENT:  Head: Normocephalic and atraumatic.  Nose: Nose normal.  Mouth/Throat: Oropharynx is clear and moist.  Eyes:  Pupils are equal, round, and reactive to light. Conjunctivae are normal.  Neck: Neck supple. No JVD present. No tracheal deviation present.  Cardiovascular: Regular rhythm and intact distal pulses.  Pulmonary/Chest: Effort normal. No respiratory distress.  Abdominal: She exhibits no distension. There is no guarding.  Musculoskeletal:  Patient is slow to rise from a seated position does have some concordant low back pain with facet loading.  Mostly pain over the right PSIS no real pain over the greater trochanter no pain with hip rotation.  She has good distal strength with some lack of range of motion of the right ankle.  No distinct swelling of the lower limbs.  Neurological: She is alert and oriented to person, place, and time. She exhibits normal muscle tone. Coordination normal.  Skin: Skin is warm. No rash noted. No erythema.  Psychiatric:  Patient with memory difficulty and poor historian.  She is oriented.  She is somewhat agitated when we try to logically go through her exam and complaints today.  Nursing note and vitals reviewed.   Ortho Exam Imaging: No results found.  Past Medical/Family/Surgical/Social History: Medications & Allergies reviewed per EMR, new medications updated. Patient Active Problem List   Diagnosis Date Noted  . Traumatic arthritis of ankle, right 08/12/2017  . History of non-ST elevation myocardial infarction (NSTEMI) 12/17/2016  . Chronic anticoagulation 12/17/2016  . Chest pain with moderate risk for cardiac etiology 05/30/2015  . Internal and external hemorrhoids without complication 09/32/3557  . History of GI bleed 06/24/2014  . Chronic diastolic heart failure ([place]) 06/24/2014  . Chronic atrial fibrillation ([place]) 06/24/2014  . Pre-syncope 05/05/2014  . Osteoarthritis of right knee 08/10/2013  . OSA on CPAP 06/16/2013  . Bradycardia 10/25/2010  . Fatigue 10/25/2010  . Dyspnea 09/18/2010  . Coronary artery disease   . Hypokalemia   . Incontinence  of urine   . Stromal tumor of the stomach   . Dyslipidemia 05/06/2007  . Essential hypertension 05/06/2007  . GERD 05/06/2007  . IRRITABLE BOWEL SYNDROME 05/06/2007   Past Medical History:  Diagnosis Date  . Arthritis   . Atrial fibrillation ([place])   . Bladder polyps   . Cancer ([place])    stomach  . Cataracts, bilateral   . Cervical polyp   . Chronic kidney disease    urinary incont-  . Coronary artery  disease    minimal  . Dysrhythmia     AF  hx     dr Acie Fredrickson  . GERD (gastroesophageal reflux disease)   . Hiatal hernia   . Hyperlipidemia   . Hypertension   . Hypokalemia   . IBS (irritable bowel syndrome)   . IFG (impaired fasting glucose)   . Incontinence of urine   . MI, acute, non ST segment elevation ([place]) [id number].[id number]   cath  . OSA (obstructive sleep apnea)    hypoxia , not CO 2 retainer,   . OSA on CPAP 06/16/2013    Sleep study 1- 11-15 with an AHI of 61 , now titrated to CPAP 9 cm water with 2 cm EPR.   . Osteopenia   . Stromal tumor of the stomach    cancer   Family History  Problem Relation Age of Onset  . Pancreatic cancer Paternal Grandmother   . Heart disease Paternal Grandfather        both sides of the family  . Diabetes Paternal Aunt    Past Surgical History:  Procedure Laterality Date  . ANKLE FRACTURE SURGERY  2006   right, plate   . CARDIAC CATHETERIZATION  12/07/20210   Dr.Nahser,minor cad  . COLONOSCOPY    . COLONOSCOPY N/A 04/28/2014   Procedure: COLONOSCOPY;  Surgeon: Inda Castle, MD;  Location: [place];  Service: Endoscopy;  Laterality: N/A;  . ENTEROSCOPY N/A 04/28/2014   Procedure: ENTEROSCOPY;  Surgeon: Inda Castle, MD;  Location: [facility] ENDOSCOPY;  Service: Endoscopy;  Laterality: N/A;  . EYE SURGERY     both cataracts  . KNEE ARTHROSCOPY Right 05/28/2012   Procedure: RIGHT KNEE ARTHROSCOPY PARTIAL MEDIAL AND LATERAL MENISECTOMY WITH CHONDROPLASTY;  Surgeon: Alta Corning, MD;  Location: [place];  Service:  Orthopedics;  Laterality: Right;  . stromal stomach  tumor  03/26/2007   resection  . TOTAL KNEE ARTHROPLASTY Right 08/10/2013   Procedure: TOTAL KNEE ARTHROPLASTY;  Surgeon: Alta Corning, MD;  Location: [place];  Service: Orthopedics;  Laterality: Right;  . UPPER GI ENDOSCOPY     Social History   Occupational History  . Occupation: retired    Fish farm manager: RETIRED  Tobacco Use  . Smoking status: Never Smoker  . Smokeless tobacco: Never Used  Substance and Sexual Activity  . Alcohol use: Yes    Alcohol/week: 2.0 standard drinks    Types: 2 Glasses of wine per week    Comment: one per week  . Drug use: No  . Sexual activity: Never

## 2017-11-22 NOTE — Progress Notes (Signed)
 .  Numeric Pain Rating Scale and Functional Assessment Average Pain 9   In the last MONTH (on 0-10 scale) has pain interfered with the following?  1. General activity like being  able to carry out your everyday physical activities such as walking, climbing stairs, carrying groceries, or moving a chair?  Rating(5)   +Driver, +BT(eliquis), -Dye Allergies.

## 2017-11-28 NOTE — Procedures (Signed)
Lumbosacral Transforaminal Epidural Steroid Injection - Sub-Pedicular Approach with Fluoroscopic Guidance  [name]      Date of Birth: 12-18-1934 MRN: [id number] PCP: Crist Infante, MD      Visit Date: 11/22/2017   Universal Protocol:    Date/Time: 11/22/2017  Consent Given By: the patient  Position: PRONE  Additional Comments: Vital signs were monitored before and after the procedure. Patient was prepped and draped in the usual sterile fashion. The correct patient, procedure, and site was verified.   Injection Procedure Details:  Procedure Site One Meds Administered:  Meds ordered this encounter  Medications  . betamethasone acetate-betamethasone sodium phosphate (CELESTONE) injection 12 mg    Laterality: Right  Location/Site:  L5-S1  Needle size: 22 G  Needle type: Spinal  Needle Placement: Transforaminal  Findings:    -Comments: Excellent flow of contrast along the nerve and into the epidural space.  Procedure Details: After squaring off the end-plates to get a true AP view, the C-arm was positioned so that an oblique view of the foramen as noted above was visualized. The target area is just inferior to the "nose of the scotty dog" or sub pedicular. The soft tissues overlying this structure were infiltrated with 2-3 ml. of 1% Lidocaine without Epinephrine.  The spinal needle was inserted toward the target using a "trajectory" view along the fluoroscope beam.  Under AP and lateral visualization, the needle was advanced so it did not puncture dura and was located close the 6 O'Clock position of the pedical in AP tracterory. Biplanar projections were used to confirm position. Aspiration was confirmed to be negative for CSF and/or blood. A 1-2 ml. volume of Isovue-250 was injected and flow of contrast was noted at each level. Radiographs were obtained for documentation purposes.   After attaining the desired flow of contrast documented above, a 0.5 to 1.0  ml test dose of 0.25% Marcaine was injected into each respective transforaminal space.  The patient was observed for 90 seconds post injection.  After no sensory deficits were reported, and normal lower extremity motor function was noted,   the above injectate was administered so that equal amounts of the injectate were placed at each foramen (level) into the transforaminal epidural space.   Additional Comments:  The patient tolerated the procedure well Dressing: Band-Aid    Post-procedure details: Patient was observed during the procedure. Post-procedure instructions were reviewed.  Patient left the clinic in stable condition.

## 2017-11-28 NOTE — Progress Notes (Signed)
[name] - 83 y.o. female MRN [id number]  Date of birth: 1934-10-01  Office Visit Note: Visit Date: 11/22/2017 PCP: Crist Infante, MD Referred by: Crist Infante, MD  Subjective: Chief Complaint  Patient presents with  . Lower Back - Pain   HPI: [name] is an 83 year old female that comes in today for planned right L5 transforaminal epidural steroid injection.  Please see our prior evaluation and management note for further details and justification.   ROS Otherwise per HPI.  Assessment & Plan: Visit Diagnoses:  1. Lumbar radiculopathy   2. Radiculopathy due to lumbar intervertebral disc disorder     Plan: No additional findings.   Meds & Orders:  Meds ordered this encounter  Medications  . betamethasone acetate-betamethasone sodium phosphate (CELESTONE) injection 12 mg    Orders Placed This Encounter  Procedures  . XR C-ARM NO REPORT  . Epidural Steroid injection    Follow-up: Return in about 4 weeks (around 12/20/2017).   Procedures: No procedures performed  Lumbosacral Transforaminal Epidural Steroid Injection - Sub-Pedicular Approach with Fluoroscopic Guidance  [name]      Date of Birth: Mar 16, 1934 MRN: [id number] PCP: Crist Infante, MD      Visit Date: 11/22/2017   Universal Protocol:    Date/Time: 11/22/2017  Consent Given By: the patient  Position: PRONE  Additional Comments: Vital signs were monitored before and after the procedure. Patient was prepped and draped in the usual sterile fashion. The correct patient, procedure, and site was verified.   Injection Procedure Details:  Procedure Site One Meds Administered:  Meds ordered this encounter  Medications  . betamethasone acetate-betamethasone sodium phosphate (CELESTONE) injection 12 mg    Laterality: Right  Location/Site:  L5-S1  Needle size: 22 G  Needle type: Spinal  Needle Placement: Transforaminal  Findings:    -Comments: Excellent flow of contrast  along the nerve and into the epidural space.  Procedure Details: After squaring off the end-plates to get a true AP view, the C-arm was positioned so that an oblique view of the foramen as noted above was visualized. The target area is just inferior to the "nose of the scotty dog" or sub pedicular. The soft tissues overlying this structure were infiltrated with 2-3 ml. of 1% Lidocaine without Epinephrine.  The spinal needle was inserted toward the target using a "trajectory" view along the fluoroscope beam.  Under AP and lateral visualization, the needle was advanced so it did not puncture dura and was located close the 6 O'Clock position of the pedical in AP tracterory. Biplanar projections were used to confirm position. Aspiration was confirmed to be negative for CSF and/or blood. A 1-2 ml. volume of Isovue-250 was injected and flow of contrast was noted at each level. Radiographs were obtained for documentation purposes.   After attaining the desired flow of contrast documented above, a 0.5 to 1.0 ml test dose of 0.25% Marcaine was injected into each respective transforaminal space.  The patient was observed for 90 seconds post injection.  After no sensory deficits were reported, and normal lower extremity motor function was noted,   the above injectate was administered so that equal amounts of the injectate were placed at each foramen (level) into the transforaminal epidural space.   Additional Comments:  The patient tolerated the procedure well Dressing: Band-Aid    Post-procedure details: Patient was observed during the procedure. Post-procedure instructions were reviewed.  Patient left the clinic in stable condition.    Clinical History:  MRI LUMBAR SPINE WITHOUT CONTRAST  TECHNIQUE: Multiplanar, multisequence MR imaging of the lumbar spine was performed. No intravenous contrast was administered.  COMPARISON:  03/28/2010 CT abdomen and pelvis.  FINDINGS: Segmentation:   Standard.  Alignment: Grade 1 L5-S1 anterolisthesis and chronic L5 pars defects are stable from the prior CT of the abdomen and pelvis. Otherwise normal lumbar lordosis.  Vertebrae:  No fracture, evidence of discitis, or bone lesion.  Conus medullaris: Extends to the L1-2 level and appears normal.  Paraspinal and other soft tissues: Negative.  Disc levels:  L1-2: Minimal disc bulge and mild facet hypertrophy. Trace facet effusions. No significant foraminal narrowing or canal stenosis.  L2-3: Small disc bulge eccentric to the left with mild facet hypertrophy. Mild left-greater-than-right foraminal narrowing. No significant canal stenosis.  L3-4: Small disc bulge with mild facet and ligamentum flavum hypertrophy. Mild bilateral foraminal and lateral recess narrowing. No significant canal stenosis.  L4-5: Small disc bulge and central disc protrusion. Moderate facet hypertrophy. Mild bilateral recess narrowing. No significant foraminal narrowing or canal stenosis.  L5-S1: Anterolisthesis with small uncovered disc bulge and advanced facet hypertrophy greater on the right. Severe right and moderate left foraminal narrowing. No significant canal stenosis.  IMPRESSION: 1. No acute osseous abnormality. 2. Grade 1 L5-S1 anterolisthesis and chronic L5 pars defects stable from 2012 CT given differences in technique. 3. Lumbar degenerative changes are greatest at the L5-S1 level where anterolisthesis combined with disc and facet disease results in severe right and moderate left foraminal narrowing.   Electronically Signed   By: Kristine Garbe M.D.   On: 08/27/2016 22:56     Objective:  VS:  HT:    WT:   BMI:     BP:115/66  HR:81bpm  TEMP:(!) 97.5 F (36.4 C)(Oral)  RESP:  Physical Exam  Ortho Exam Imaging: No results found.

## 2017-12-05 ENCOUNTER — Other Ambulatory Visit (INDEPENDENT_AMBULATORY_CARE_PROVIDER_SITE_OTHER): Payer: Self-pay | Admitting: Physical Medicine and Rehabilitation

## 2017-12-06 ENCOUNTER — Ambulatory Visit: Payer: Medicare Other | Admitting: Cardiology

## 2017-12-06 ENCOUNTER — Encounter: Payer: Self-pay | Admitting: Cardiology

## 2017-12-06 VITALS — BP 122/80 | HR 79 | Ht 64.5 in | Wt 133.1 lb

## 2017-12-06 DIAGNOSIS — I482 Chronic atrial fibrillation, unspecified: Secondary | ICD-10-CM | POA: Diagnosis not present

## 2017-12-06 DIAGNOSIS — I10 Essential (primary) hypertension: Secondary | ICD-10-CM | POA: Diagnosis not present

## 2017-12-06 DIAGNOSIS — I5032 Chronic diastolic (congestive) heart failure: Secondary | ICD-10-CM | POA: Diagnosis not present

## 2017-12-06 DIAGNOSIS — E785 Hyperlipidemia, unspecified: Secondary | ICD-10-CM

## 2017-12-06 DIAGNOSIS — I2510 Atherosclerotic heart disease of native coronary artery without angina pectoris: Secondary | ICD-10-CM | POA: Diagnosis not present

## 2017-12-06 NOTE — Patient Instructions (Signed)
Medication Instructions:  Your physician recommends that you continue on your current medications as directed. Please refer to the Current Medication list given to you today.  If you need a refill on your cardiac medications before your next appointment, please call your pharmacy.   Lab work: None Ordered  If you have labs (blood work) drawn today and your tests are completely normal, you will receive your results only by: [name] MyChart Message (if you have MyChart) OR . A paper copy in the mail If you have any lab test that is abnormal or we need to change your treatment, we will call you to review the results.  Testing/Procedures: None Ordered  Follow-Up: At [facility], you and your health needs are our priority.  As part of our continuing mission to provide you with exceptional heart care, we have created designated Provider Care Teams.  These Care Teams include your primary Cardiologist (physician) and Advanced Practice Providers (APPs -  Physician Assistants and Nurse Practitioners) who all work together to provide you with the care you need, when you need it. You will need a follow up appointment in:  6 months.  Please call our office 2 months in advance to schedule this appointment.  You may see Mertie Moores, MD or one of the following Advanced Practice Providers on your designated Care Team: Richardson Dopp, PA-C [place], [place] . Daune Perch, NP  Any Other Special Instructions Will Be Listed Below (If Applicable).  PLEASE TRY TO DRINK 8 CUPS OF WATER A DAY FOR ADEQUATE HYDRATION  '

## 2017-12-06 NOTE — Progress Notes (Signed)
Cardiology Office Note:    Date:  12/06/2017   ID:  [name], DOB [birth year], MRN [id number]  PCP:  Crist Infante, MD  Cardiologist:  Mertie Moores, MD  Referring MD: Crist Infante, MD   Chief Complaint  Patient presents with  . Follow-up    CAD, Afib    History of Present Illness:    [name] is a 83 y.o. female with a past medical history significant for hypertension, permanent atrial fibrillation on Eliquis, hyperlipidemia, CAD with small NSTEMI by cardiac enzymes but minimal CAD by cath, chronic diastolic CHF.   She is on atenolol for control of A. fib and dose was reduced in the past for fatigue which then improved.  She had an episode of presyncope in 2016 thought to be vasovagal.  Heart monitor did not show any slow heart rates to cause syncope.  She had some complaints of chest pain in 2018 and a stress Myoview was negative for ischemia.  She had normal EF on echo.  She lives at [facility].  She is widowed.  She was last seen by Dr. Acie Fredrickson on August 22, 2017 at which time she was doing well.   She is here today alone for follow-up.  She is still getting adjusted to living at [facility].  She has a lot of ankle and knee problems that have limited her exercising. She still does at least one activity at [employer] on most days. She has some localized tenderness on her right chest that is tender to palpation. No shortness of breath, palpitations, orthopnea, PND, syncope.  She has occ lightheadedness and briefly feeling unsteady.   Past Medical History:  Diagnosis Date  . Arthritis   . Atrial fibrillation ([place])   . Bladder polyps   . Cancer ([place])    stomach  . Cataracts, bilateral   . Cervical polyp   . Chronic kidney disease    urinary incont-  . Coronary artery disease    minimal  . Dysrhythmia     AF  hx     dr Acie Fredrickson  . GERD (gastroesophageal reflux disease)   . Hiatal hernia   . Hyperlipidemia   . Hypertension   . Hypokalemia   . IBS (irritable  bowel syndrome)   . IFG (impaired fasting glucose)   . Incontinence of urine   . MI, acute, non ST segment elevation ([place]) GGE.3,6629   cath  . OSA (obstructive sleep apnea)    hypoxia , not CO 2 retainer,   . OSA on CPAP 06/16/2013    Sleep study 1- 11-15 with an AHI of 61 , now titrated to CPAP 9 cm water with 2 cm EPR.   . Osteopenia   . Stromal tumor of the stomach [facility])    cancer    Past Surgical History:  Procedure Laterality Date  . ANKLE FRACTURE SURGERY  2006   right, plate   . CARDIAC CATHETERIZATION  12/07/20210   Dr.Nahser,minor cad  . COLONOSCOPY    . COLONOSCOPY N/A Feb 06, 202016   Procedure: COLONOSCOPY;  Surgeon: Inda Castle, MD;  Location: [place];  Service: Endoscopy;  Laterality: N/A;  . ENTEROSCOPY N/A Feb 06, 202016   Procedure: ENTEROSCOPY;  Surgeon: Inda Castle, MD;  Location: [facility] ENDOSCOPY;  Service: Endoscopy;  Laterality: N/A;  . EYE SURGERY     both cataracts  . KNEE ARTHROSCOPY Right 05/28/2012   Procedure: RIGHT KNEE ARTHROSCOPY PARTIAL MEDIAL AND LATERAL MENISECTOMY WITH CHONDROPLASTY;  Surgeon: Karen Chafe  Berenice Primas, MD;  Location: [place];  Service: Orthopedics;  Laterality: Right;  . stromal stomach  tumor  03/26/2007   resection  . TOTAL KNEE ARTHROPLASTY Right 08/10/2013   Procedure: TOTAL KNEE ARTHROPLASTY;  Surgeon: Alta Corning, MD;  Location: [place];  Service: Orthopedics;  Laterality: Right;  . UPPER GI ENDOSCOPY      Current Medications: Current Meds  Medication Sig  . amLODipine (NORVASC) 5 MG tablet Take 1 tablet (5 mg total) by mouth daily. *Please call and schedule a one year follow up appointment*  . atorvastatin (LIPITOR) 40 MG tablet 1/2 tablet by mouth daily   . calcium carbonate (TUMS - DOSED IN MG ELEMENTAL CALCIUM) 500 MG chewable tablet Chew 1 tablet by mouth 3 (three) times daily as needed for indigestion or heartburn.  . cholecalciferol (VITAMIN D) 1000 units tablet Take 1,000 Units by mouth daily.  . diclofenac  sodium (VOLTAREN) 1 % GEL Apply 4 g topically 4 (four) times daily.  [name] doxazosin (CARDURA) 4 MG tablet Take 4 mg by mouth daily.  . DULoxetine (CYMBALTA) 20 MG capsule Take 1 capsule (20 mg total) by mouth daily. For 7 nights then take 2 capsules daily at night  . ELIQUIS 5 MG TABS tablet Take 5 mg by mouth 2 (two) times daily.  [name] esomeprazole (NEXIUM) 40 MG capsule Take 40 mg by mouth daily at 12 noon.  . galantamine (RAZADYNE ER) 8 MG 24 hr capsule Take 8 mg daily with breakfast by mouth.  . hydrochlorothiazide (HYDRODIURIL) 12.5 MG tablet Take 12.5 mg by mouth 2 (two) times daily.  . metoprolol succinate (TOPROL-XL) 100 MG 24 hr tablet Take 1 tablet (100 mg total) daily by mouth. Take with or immediately following a meal.  . Multiple Vitamin (MULTIVITAMIN WITH MINERALS) TABS tablet Take 1 tablet by mouth daily.  . multivitamin-lutein (OCUVITE-LUTEIN) CAPS capsule Take 1 capsule by mouth daily.  . nitroGLYCERIN (NITROSTAT) 0.4 MG SL tablet Place 1 tablet (0.4 mg total) under the tongue every 5 (five) minutes as needed for chest pain.  . potassium chloride SA (K-DUR,KLOR-CON) 20 MEQ tablet Take 1 tablet (20 mEq total) by mouth 2 (two) times daily.  . valsartan (DIOVAN) 160 MG tablet TAKE 1 TABLET BY MOUTH TWICE A DAY     Allergies:   Detrol [tolterodine]; Tranexamic acid; Xarelto [rivaroxaban]; and Pradaxa [dabigatran etexilate mesylate]   Social History   Socioeconomic History  . Marital status: Married    Spouse name: Joneen Boers  . Number of children: 2  . Years of education: Masters  . Highest education level: Not on file  Occupational History  . Occupation: retired    Fish farm manager: RETIRED  Social Needs  . Financial resource strain: Not on file  . Food insecurity:    Worry: Not on file    Inability: Not on file  . Transportation needs:    Medical: Not on file    Non-medical: Not on file  Tobacco Use  . Smoking status: Never Smoker  . Smokeless tobacco: Never Used  Substance and  Sexual Activity  . Alcohol use: Yes    Alcohol/week: 2.0 standard drinks    Types: 2 Glasses of wine per week    Comment: one per week  . Drug use: No  . Sexual activity: Never  Lifestyle  . Physical activity:    Days per week: Not on file    Minutes per session: Not on file  . Stress: Not on file  Relationships  .  Social connections:    Talks on phone: Not on file    Gets together: Not on file    Attends religious service: Not on file    Active member of club or organization: Not on file    Attends meetings of clubs or organizations: Not on file    Relationship status: Not on file  Other Topics Concern  . Not on file  Social History Narrative   Patient is married Joneen Boers) and lives at home with her husband.   Patient is retired.   Patient has a Oceanographer in Education   Patient is right-handed.   Patient does not drink any caffeine.   Patient has one living child and one child is deceased.     Family History: The patient's family history includes Diabetes in her paternal aunt; Heart disease in her paternal grandfather; Pancreatic cancer in her paternal grandmother. ROS:   Please see the history of present illness.     All other systems reviewed and are negative.  EKGs/Labs/Other Studies Reviewed:    The following studies were reviewed today:  Nuclear stress test 01/08/2017  The left ventricular ejection fraction is normal (55-65%).  Nuclear stress EF: 65%.  There was no ST segment deviation noted during stress.  The study is normal.  This is a low risk study.   Echocardiogram 06/16/2015 Study Conclusions - Left ventricle: The cavity size was normal. Wall thickness was   increased in a pattern of mild LVH. Systolic function was normal.   The estimated ejection fraction was in the range of 55% to 60%.   Wall motion was normal; there were no regional wall motion   abnormalities. - Mitral valve: Severely calcified annulus. There was mild   regurgitation. - Left  atrium: The atrium was moderately dilated. - Pulmonary arteries: Systolic pressure was mildly increased. PA   peak pressure: 33 mm Hg (S).  Impressions: - Normal LV systolic function; mild LVH; severe MAC with mild MR;   moderate LAE; mild TR with mildly elevated pulmonary pressure.  EKG:  EKG is ordered today.  Atrial fibrillation at 79 bpm with no acute changes  Recent Labs: No results found for requested labs within last 8760 hours.   Recent Lipid Panel    Component Value Date/Time   CHOL 123 10/30/2011 0928   TRIG 38.0 10/30/2011 0928   HDL 56.60 10/30/2011 0928   CHOLHDL 2 10/30/2011 0928   VLDL 7.6 10/30/2011 0928   LDLCALC 59 10/30/2011 0928    Physical Exam:    VS:  BP 122/80   Pulse 79   Ht 5' 4.5" (1.638 m)   Wt 133 lb 1.9 oz (60.4 kg)   BMI 22.50 kg/m     Wt Readings from Last 3 Encounters:  12/06/17 133 lb 1.9 oz (60.4 kg)  11/12/17 135 lb (61.2 kg)  09/25/17 130 lb (59 kg)     Physical Exam  Constitutional: She is oriented to person, place, and time. She appears well-developed and well-nourished. No distress.  Somewhat frail elderly female  HENT:  Head: Normocephalic and atraumatic.  Neck: Normal range of motion. Neck supple. No JVD present.  Cardiovascular: Normal rate, normal heart sounds and intact distal pulses. An irregularly irregular rhythm present. Exam reveals no gallop and no friction rub.  No murmur heard. Pulmonary/Chest: Effort normal and breath sounds normal. No respiratory distress. She has no wheezes. She has no rales.  Abdominal: Soft. Bowel sounds are normal.  Musculoskeletal: Normal range of motion. She exhibits  no edema.  Multiple joint pains  Neurological: She is alert and oriented to person, place, and time.  Skin: Skin is warm and dry.  Psychiatric: She has a normal mood and affect. Her behavior is normal. Judgment and thought content normal.  Vitals reviewed.    ASSESSMENT:    1. Coronary artery disease involving native  coronary artery of native heart without angina pectoris   2. Chronic atrial fibrillation (HCC)   3. Chronic diastolic heart failure ([place])   4. Essential hypertension   5. Dyslipidemia    PLAN:    In order of problems listed above:  CAD: Small NSTEMI by cardiac enzymes but minimal CAD by cath in 2010.  Normal nuclear stress test and echo in 2018. Has some atypical localized tenderness to palpation with no associated symptoms.  I have advised her to contact us if she develops more significant symptoms and we will seen her sooner than her planned follow-up.  Permanent atrial fibrillation: Rate controlled with metoprolol.  On Eliquis for stroke risk reduction  Chronic diastolic CHF: Volume stable.  Patient has Lasix to use as needed but has not needed any.  No signs or symptoms of volume overload.  Hypertension: BP is well controlled on current medications.   Hyperlipidemia: Management by Dr. Joylene Draft   Medication Adjustments/Labs and Tests Ordered: Current medicines are reviewed at length with the patient today.  Concerns regarding medicines are outlined above. Labs and tests ordered and medication changes are outlined in the patient instructions below:  Patient Instructions  Medication Instructions:  Your physician recommends that you continue on your current medications as directed. Please refer to the Current Medication list given to you today.  If you need a refill on your cardiac medications before your next appointment, please call your pharmacy.   Lab work: None Ordered  If you have labs (blood work) drawn today and your tests are completely normal, you will receive your results only by: [name] MyChart Message (if you have MyChart) OR . A paper copy in the mail If you have any lab test that is abnormal or we need to change your treatment, we will call you to review the results.  Testing/Procedures: None Ordered  Follow-Up: At [facility], you and your health needs are our  priority.  As part of our continuing mission to provide you with exceptional heart care, we have created designated Provider Care Teams.  These Care Teams include your primary Cardiologist (physician) and Advanced Practice Providers (APPs -  Physician Assistants and Nurse Practitioners) who all work together to provide you with the care you need, when you need it. You will need a follow up appointment in:  6 months.  Please call our office 2 months in advance to schedule this appointment.  You may see Mertie Moores, MD or one of the following Advanced Practice Providers on your designated Care Team: Richardson Dopp, PA-C [place], [place] . Daune Perch, NP  Any Other Special Instructions Will Be Listed Below (If Applicable).  PLEASE TRY TO DRINK 8 CUPS OF WATER A DAY FOR ADEQUATE HYDRATION  '    Signed, Daune Perch, NP  12/06/2017 3:50 PM    [facility]

## 2017-12-10 ENCOUNTER — Other Ambulatory Visit: Payer: Self-pay | Admitting: Cardiovascular Disease

## 2017-12-10 NOTE — Telephone Encounter (Signed)
Please advise 

## 2017-12-24 ENCOUNTER — Ambulatory Visit (INDEPENDENT_AMBULATORY_CARE_PROVIDER_SITE_OTHER): Payer: Self-pay | Admitting: Physical Medicine and Rehabilitation

## 2017-12-27 ENCOUNTER — Other Ambulatory Visit: Payer: Self-pay

## 2017-12-27 ENCOUNTER — Encounter (HOSPITAL_COMMUNITY): Payer: Self-pay | Admitting: Emergency Medicine

## 2017-12-27 ENCOUNTER — Inpatient Hospital Stay (HOSPITAL_COMMUNITY)
Admission: EM | Admit: 2017-12-27 | Discharge: 2017-12-29 | DRG: 641 | Disposition: A | Discharge status: Home Health | Payer: Medicare Other | Attending: Internal Medicine | Admitting: Internal Medicine

## 2017-12-27 DIAGNOSIS — I2510 Atherosclerotic heart disease of native coronary artery without angina pectoris: Secondary | ICD-10-CM | POA: Diagnosis present

## 2017-12-27 DIAGNOSIS — K219 Gastro-esophageal reflux disease without esophagitis: Secondary | ICD-10-CM | POA: Diagnosis present

## 2017-12-27 DIAGNOSIS — F0390 Unspecified dementia without behavioral disturbance: Secondary | ICD-10-CM | POA: Diagnosis present

## 2017-12-27 DIAGNOSIS — Z7901 Long term (current) use of anticoagulants: Secondary | ICD-10-CM | POA: Diagnosis not present

## 2017-12-27 DIAGNOSIS — E785 Hyperlipidemia, unspecified: Secondary | ICD-10-CM | POA: Diagnosis present

## 2017-12-27 DIAGNOSIS — I252 Old myocardial infarction: Secondary | ICD-10-CM

## 2017-12-27 DIAGNOSIS — E871 Hypo-osmolality and hyponatremia: Principal | ICD-10-CM | POA: Diagnosis present

## 2017-12-27 DIAGNOSIS — I130 Hypertensive heart and chronic kidney disease with heart failure and stage 1 through stage 4 chronic kidney disease, or unspecified chronic kidney disease: Secondary | ICD-10-CM | POA: Diagnosis present

## 2017-12-27 DIAGNOSIS — W19XXXA Unspecified fall, initial encounter: Secondary | ICD-10-CM

## 2017-12-27 DIAGNOSIS — N189 Chronic kidney disease, unspecified: Secondary | ICD-10-CM | POA: Diagnosis present

## 2017-12-27 DIAGNOSIS — Z79899 Other long term (current) drug therapy: Secondary | ICD-10-CM

## 2017-12-27 DIAGNOSIS — Z888 Allergy status to other drugs, medicaments and biological substances status: Secondary | ICD-10-CM

## 2017-12-27 DIAGNOSIS — I5032 Chronic diastolic (congestive) heart failure: Secondary | ICD-10-CM | POA: Diagnosis present

## 2017-12-27 DIAGNOSIS — E876 Hypokalemia: Secondary | ICD-10-CM | POA: Diagnosis present

## 2017-12-27 DIAGNOSIS — Z96651 Presence of right artificial knee joint: Secondary | ICD-10-CM | POA: Diagnosis present

## 2017-12-27 DIAGNOSIS — Z9989 Dependence on other enabling machines and devices: Secondary | ICD-10-CM

## 2017-12-27 DIAGNOSIS — T502X5A Adverse effect of carbonic-anhydrase inhibitors, benzothiadiazides and other diuretics, initial encounter: Secondary | ICD-10-CM | POA: Diagnosis present

## 2017-12-27 DIAGNOSIS — I482 Chronic atrial fibrillation, unspecified: Secondary | ICD-10-CM | POA: Diagnosis present

## 2017-12-27 DIAGNOSIS — R17 Unspecified jaundice: Secondary | ICD-10-CM | POA: Diagnosis present

## 2017-12-27 DIAGNOSIS — G4733 Obstructive sleep apnea (adult) (pediatric): Secondary | ICD-10-CM | POA: Diagnosis present

## 2017-12-27 LAB — COMPREHENSIVE METABOLIC PANEL
ALBUMIN: 4.6 g/dL (ref 3.5–5.0)
ALT: 14 U/L (ref 0–44)
ANION GAP: 15 (ref 5–15)
AST: 27 U/L (ref 15–41)
Alkaline Phosphatase: 46 U/L (ref 38–126)
BUN: 23 mg/dL (ref 8–23)
CO2: 24 mmol/L (ref 22–32)
Calcium: 9.3 mg/dL (ref 8.9–10.3)
Chloride: 85 mmol/L — ABNORMAL LOW (ref 98–111)
Creatinine, Ser: 0.82 mg/dL (ref 0.44–1.00)
GFR calc Af Amer: >60 mL/min (ref >60)
GFR calc non Af Amer: >60 mL/min (ref >60)
GLUCOSE: 88 mg/dL (ref 70–99)
POTASSIUM: 2.8 mmol/L — AB (ref 3.5–5.1)
SODIUM: 124 mmol/L — AB (ref 135–145)
TOTAL PROTEIN: 7.3 g/dL (ref 6.5–8.1)
Total Bilirubin: 1.5 mg/dL — ABNORMAL HIGH (ref 0.3–1.2)

## 2017-12-27 LAB — CBC WITH DIFFERENTIAL/PLATELET
ABS IMMATURE GRANULOCYTES: 0.03 10*3/uL (ref 0.00–0.07)
BASOS ABS: 0 10*3/uL (ref 0.0–0.1)
Basophils Relative: 0 %
Eosinophils Absolute: 0 10*3/uL (ref 0.0–0.5)
Eosinophils Relative: 1 %
HEMATOCRIT: 39.6 % (ref 36.0–46.0)
Hemoglobin: 13.5 g/dL (ref 12.0–15.0)
IMMATURE GRANULOCYTES: 1 %
LYMPHS ABS: 0.7 10*3/uL (ref 0.7–4.0)
LYMPHS PCT: 14 %
MCH: 29.4 pg (ref 26.0–34.0)
MCHC: 34.1 g/dL (ref 30.0–36.0)
MCV: 86.3 fL (ref 80.0–100.0)
Monocytes Absolute: 0.5 10*3/uL (ref 0.1–1.0)
Monocytes Relative: 10 %
NEUTROS ABS: 3.6 10*3/uL (ref 1.7–7.7)
NEUTROS PCT: 74 %
NRBC: 0 % (ref 0.0–0.2)
PLATELETS: 181 10*3/uL (ref 150–400)
RBC: 4.59 MIL/uL (ref 3.87–5.11)
RDW: 12.7 % (ref 11.5–15.5)
WBC: 4.8 10*3/uL (ref 4.0–10.5)

## 2017-12-27 LAB — BASIC METABOLIC PANEL
Anion gap: 12 (ref 5–15)
Anion gap: 7 (ref 5–15)
BUN: 17 mg/dL (ref 8–23)
BUN: 20 mg/dL (ref 8–23)
CALCIUM: 9.1 mg/dL (ref 8.9–10.3)
CO2: 24 mmol/L (ref 22–32)
CO2: 24 mmol/L (ref 22–32)
Calcium: 8.8 mg/dL — ABNORMAL LOW (ref 8.9–10.3)
Chloride: 92 mmol/L — ABNORMAL LOW (ref 98–111)
Chloride: 96 mmol/L — ABNORMAL LOW (ref 98–111)
Creatinine, Ser: 0.77 mg/dL (ref 0.44–1.00)
Creatinine, Ser: 0.68 mg/dL (ref 0.44–1.00)
GFR calc Af Amer: >60 mL/min (ref >60)
GFR calc Af Amer: >60 mL/min (ref >60)
GLUCOSE: 109 mg/dL — AB (ref 70–99)
Glucose, Bld: 89 mg/dL (ref 70–99)
POTASSIUM: 2.9 mmol/L — AB (ref 3.5–5.1)
POTASSIUM: 3.7 mmol/L (ref 3.5–5.1)
Sodium: 128 mmol/L — ABNORMAL LOW (ref 135–145)
Sodium: 127 mmol/L — ABNORMAL LOW (ref 135–145)

## 2017-12-27 LAB — LIPASE, BLOOD: Lipase: 45 U/L (ref 11–51)

## 2017-12-27 MED ORDER — CHLORHEXIDINE GLUCONATE 0.12 % MT SOLN
15.0000 mL | Freq: Two times a day (BID) | OROMUCOSAL | Status: DC
  Administered 2017-12-27 – 2017-12-28 (×4): 15 mL via OROMUCOSAL
  Filled 2017-12-27 (×6): qty 15

## 2017-12-27 MED ORDER — POTASSIUM CHLORIDE 10 MEQ/100ML IV SOLN
10.0000 meq | Freq: Once | INTRAVENOUS | Status: AC
  Administered 2017-12-27: 10 meq via INTRAVENOUS
  Filled 2017-12-27: qty 100

## 2017-12-27 MED ORDER — ENSURE ENLIVE PO LIQD
237.0000 mL | Freq: Two times a day (BID) | ORAL | Status: DC
  Administered 2017-12-27 – 2017-12-29 (×3): 237 mL via ORAL

## 2017-12-27 MED ORDER — AMLODIPINE BESYLATE 5 MG PO TABS
5.0000 mg | ORAL_TABLET | Freq: Every day | ORAL | Status: DC
  Administered 2017-12-28 – 2017-12-29 (×2): 5 mg via ORAL
  Filled 2017-12-27 (×2): qty 1

## 2017-12-27 MED ORDER — ONDANSETRON HCL 4 MG PO TABS: 4.0000 mg | ORAL_TABLET | Freq: Four times a day (QID) | ORAL | Status: DC | PRN

## 2017-12-27 MED ORDER — ACETAMINOPHEN 650 MG RE SUPP: 650.0000 mg | Freq: Four times a day (QID) | RECTAL | Status: DC | PRN

## 2017-12-27 MED ORDER — METOPROLOL SUCCINATE ER 100 MG PO TB24
100.0000 mg | ORAL_TABLET | Freq: Every day | ORAL | Status: DC
  Administered 2017-12-27 – 2017-12-29 (×3): 100 mg via ORAL
  Filled 2017-12-27 (×3): qty 1

## 2017-12-27 MED ORDER — POTASSIUM CHLORIDE CRYS ER 20 MEQ PO TBCR
40.0000 meq | EXTENDED_RELEASE_TABLET | Freq: Once | ORAL | Status: AC
  Administered 2017-12-27: 40 meq via ORAL
  Filled 2017-12-27: qty 2

## 2017-12-27 MED ORDER — GALANTAMINE HYDROBROMIDE ER 8 MG PO CP24
16.0000 mg | ORAL_CAPSULE | Freq: Every day | ORAL | Status: DC
  Administered 2017-12-28 – 2017-12-29 (×2): 16 mg via ORAL
  Filled 2017-12-27 (×2): qty 2

## 2017-12-27 MED ORDER — SODIUM CHLORIDE 0.9 % IV SOLN
INTRAVENOUS | Status: DC
  Administered 2017-12-27 – 2017-12-29 (×3): via INTRAVENOUS

## 2017-12-27 MED ORDER — PANTOPRAZOLE SODIUM 40 MG PO TBEC
40.0000 mg | DELAYED_RELEASE_TABLET | Freq: Every day | ORAL | Status: DC
  Administered 2017-12-28 – 2017-12-29 (×2): 40 mg via ORAL
  Filled 2017-12-27 (×2): qty 1

## 2017-12-27 MED ORDER — DOXAZOSIN MESYLATE 2 MG PO TABS
4.0000 mg | ORAL_TABLET | Freq: Every day | ORAL | Status: DC
  Administered 2017-12-27 – 2017-12-28 (×2): 4 mg via ORAL
  Filled 2017-12-27 (×2): qty 2

## 2017-12-27 MED ORDER — APIXABAN 2.5 MG PO TABS
2.5000 mg | ORAL_TABLET | Freq: Two times a day (BID) | ORAL | Status: DC
  Administered 2017-12-27 – 2017-12-29 (×4): 2.5 mg via ORAL
  Filled 2017-12-27 (×4): qty 1

## 2017-12-27 MED ORDER — ACETAMINOPHEN 325 MG PO TABS
650.0000 mg | ORAL_TABLET | Freq: Four times a day (QID) | ORAL | Status: DC | PRN
  Administered 2017-12-29: 650 mg via ORAL
  Filled 2017-12-27: qty 2

## 2017-12-27 MED ORDER — APIXABAN 5 MG PO TABS: 5.0000 mg | ORAL_TABLET | Freq: Two times a day (BID) | ORAL | Status: DC

## 2017-12-27 MED ORDER — ATORVASTATIN CALCIUM 20 MG PO TABS
20.0000 mg | ORAL_TABLET | Freq: Every day | ORAL | Status: DC
  Administered 2017-12-28: 20 mg via ORAL
  Filled 2017-12-27: qty 1

## 2017-12-27 MED ORDER — ORAL CARE MOUTH RINSE
15.0000 mL | Freq: Two times a day (BID) | OROMUCOSAL | Status: DC
  Administered 2017-12-27 – 2017-12-28 (×3): 15 mL via OROMUCOSAL

## 2017-12-27 MED ORDER — ONDANSETRON HCL 4 MG/2ML IJ SOLN: 4.0000 mg | Freq: Four times a day (QID) | INTRAMUSCULAR | Status: DC | PRN

## 2017-12-27 MED ORDER — CALCIUM CARBONATE ANTACID 500 MG PO CHEW: 1.0000 | CHEWABLE_TABLET | Freq: Three times a day (TID) | ORAL | Status: DC | PRN

## 2017-12-27 MED ORDER — DULOXETINE HCL 20 MG PO CPEP
20.0000 mg | ORAL_CAPSULE | Freq: Every day | ORAL | Status: DC
  Administered 2017-12-27 – 2017-12-28 (×2): 20 mg via ORAL
  Filled 2017-12-27 (×2): qty 1

## 2017-12-27 NOTE — ED Provider Notes (Signed)
[place] DEPT Provider Note   CSN: [id number] Arrival date & time: 12/27/17  1152     History   Chief Complaint Chief Complaint  Patient presents with  . Abnormal Lab    HPI [name] is a 83 y.o. female.  83 year old female presents with several days of nausea with decreased oral intake.  Has been seen by her doctor in the office for several days for this.  Sodiums have been trending lower and now down to 126 today.  She endorses diffuse weakness associated with this.  Denies any vomiting or diarrhea currently.  No fever chills.  She also had abdominal CT done 2 days ago which show possible colitis versus under distended colon.  Sent here for further evaluation and management     Past Medical History:  Diagnosis Date  . Arthritis   . Atrial fibrillation ([place])   . Bladder polyps   . Cancer ([place])    stomach  . Cataracts, bilateral   . Cervical polyp   . Chronic kidney disease    urinary incont-  . Coronary artery disease    minimal  . Dysrhythmia     AF  hx     dr Acie Fredrickson  . GERD (gastroesophageal reflux disease)   . Hiatal hernia   . Hyperlipidemia   . Hypertension   . Hypokalemia   . IBS (irritable bowel syndrome)   . IFG (impaired fasting glucose)   . Incontinence of urine   . MI, acute, non ST segment elevation ([place]) NWG.9,5621   cath  . OSA (obstructive sleep apnea)    hypoxia , not CO 2 retainer,   . OSA on CPAP 06/16/2013    Sleep study 1- 11-15 with an AHI of 61 , now titrated to CPAP 9 cm water with 2 cm EPR.   . Osteopenia   . Stromal tumor of the stomach [facility])    cancer    Patient Active Problem List   Diagnosis Date Noted  . Traumatic arthritis of ankle, right 08/12/2017  . History of non-ST elevation myocardial infarction (NSTEMI) 12/17/2016  . Chronic anticoagulation 12/17/2016  . Chest pain with moderate risk for cardiac etiology 05/30/2015  . Internal and external hemorrhoids without complication  30/86/5784  . History of GI bleed 06/24/2014  . Chronic diastolic heart failure ([place]) 06/24/2014  . Chronic atrial fibrillation ([place]) 06/24/2014  . Pre-syncope 05/05/2014  . Osteoarthritis of right knee 08/10/2013  . OSA on CPAP 06/16/2013  . Bradycardia 10/25/2010  . Fatigue 10/25/2010  . Dyspnea 09/18/2010  . Coronary artery disease   . Hypokalemia   . Incontinence of urine   . Stromal tumor of the stomach ([place])   . Dyslipidemia 05/06/2007  . Essential hypertension 05/06/2007  . GERD 05/06/2007  . IRRITABLE BOWEL SYNDROME 05/06/2007    Past Surgical History:  Procedure Laterality Date  . ANKLE FRACTURE SURGERY  2006   right, plate   . CARDIAC CATHETERIZATION  12/07/20210   Dr.Nahser,minor cad  . COLONOSCOPY    . COLONOSCOPY N/A 11-10-202016   Procedure: COLONOSCOPY;  Surgeon: Inda Castle, MD;  Location: [place];  Service: Endoscopy;  Laterality: N/A;  . ENTEROSCOPY N/A 11-10-202016   Procedure: ENTEROSCOPY;  Surgeon: Inda Castle, MD;  Location: [facility] ENDOSCOPY;  Service: Endoscopy;  Laterality: N/A;  . EYE SURGERY     both cataracts  . KNEE ARTHROSCOPY Right 05/28/2012   Procedure: RIGHT KNEE ARTHROSCOPY PARTIAL MEDIAL AND LATERAL MENISECTOMY WITH CHONDROPLASTY;  Surgeon: Alta Corning, MD;  Location: [place];  Service: Orthopedics;  Laterality: Right;  . stromal stomach  tumor  03/26/2007   resection  . TOTAL KNEE ARTHROPLASTY Right 08/10/2013   Procedure: TOTAL KNEE ARTHROPLASTY;  Surgeon: Alta Corning, MD;  Location: [place];  Service: Orthopedics;  Laterality: Right;  . UPPER GI ENDOSCOPY       OB History   None      Home Medications    Prior to Admission medications   Medication Sig Start Date End Date Taking? Authorizing Provider  amLODipine (NORVASC) 5 MG tablet Take 1 tablet (5 mg total) by mouth daily. *Please call and schedule a one year follow up appointment* 05/17/16   Nahser, Wonda Cheng, MD  atorvastatin (LIPITOR) 40 MG tablet 1/2  tablet by mouth daily     [provider]  calcium carbonate (TUMS - DOSED IN MG ELEMENTAL CALCIUM) 500 MG chewable tablet Chew 1 tablet by mouth 3 (three) times daily as needed for indigestion or heartburn.    [provider]  cholecalciferol (VITAMIN D) 1000 units tablet Take 1,000 Units by mouth daily.    [provider]  diclofenac sodium (VOLTAREN) 1 % GEL Apply 4 g topically 4 (four) times daily. 11/12/17   Magnus Sinning, MD  doxazosin (CARDURA) 4 MG tablet TAKE 1 TABLET AT BEDTIME 12/10/17   Nahser, Wonda Cheng, MD  DULoxetine (CYMBALTA) 20 MG capsule TAKE 1 CAPSULE AT NIGHT FOR 7 NIGHTS THEN TAKE 2 CAPSULES AT NIGHT 12/11/17   Magnus Sinning, MD  ELIQUIS 5 MG TABS tablet Take 5 mg by mouth 2 (two) times daily. 04/07/15   [provider]  esomeprazole (NEXIUM) 40 MG capsule Take 40 mg by mouth daily at 12 noon.    [provider]  galantamine (RAZADYNE ER) 8 MG 24 hr capsule Take 8 mg daily with breakfast by mouth.    [provider]  hydrochlorothiazide (HYDRODIURIL) 12.5 MG tablet Take 12.5 mg by mouth 2 (two) times daily. 06/17/17   [provider]  metoprolol succinate (TOPROL-XL) 100 MG 24 hr tablet Take 1 tablet (100 mg total) daily by mouth. Take with or immediately following a meal. 01/11/17   Nahser, Wonda Cheng, MD  Multiple Vitamin (MULTIVITAMIN WITH MINERALS) TABS tablet Take 1 tablet by mouth daily.    [provider]  multivitamin-lutein (OCUVITE-LUTEIN) CAPS capsule Take 1 capsule by mouth daily.    [provider]  nitroGLYCERIN (NITROSTAT) 0.4 MG SL tablet Place 1 tablet (0.4 mg total) under the tongue every 5 (five) minutes as needed for chest pain. 12/17/16   Erlene Quan, PA-C  potassium chloride SA (K-DUR,KLOR-CON) 20 MEQ tablet Take 1 tablet (20 mEq total) by mouth 2 (two) times daily. 04/27/16   Davonna Belling, MD  valsartan (DIOVAN) 160 MG tablet TAKE 1 TABLET BY MOUTH TWICE A DAY 06/13/15    Nahser, Wonda Cheng, MD    Family History Family History  Problem Relation Age of Onset  . Pancreatic cancer Paternal Grandmother   . Heart disease Paternal Grandfather        both sides of the family  . Diabetes Paternal Aunt     Social History Social History   Tobacco Use  . Smoking status: Never Smoker  . Smokeless tobacco: Never Used  Substance Use Topics  . Alcohol use: Yes    Alcohol/week: 2.0 standard drinks    Types: 2 Glasses of wine per week    Comment:  one per week  . Drug use: No     Allergies   Detrol [tolterodine]; Tranexamic acid; Xarelto [rivaroxaban]; and Pradaxa [dabigatran etexilate mesylate]   Review of Systems Review of Systems  All other systems reviewed and are negative.    Physical Exam Updated Vital Signs BP 140/83 (BP Location: Right Arm)   Pulse 84   Temp 97.7 F (36.5 C) (Oral)   Resp 16   SpO2 100%   Physical Exam  Constitutional: She is oriented to person, place, and time. She appears well-developed and well-nourished.  Non-toxic appearance. No distress.  HENT:  Head: Normocephalic and atraumatic.  Eyes: Pupils are equal, round, and reactive to light. Conjunctivae, EOM and lids are normal.  Neck: Normal range of motion. Neck supple. No tracheal deviation present. No thyroid mass present.  Cardiovascular: Normal rate, regular rhythm and normal heart sounds. Exam reveals no gallop.  No murmur heard. Pulmonary/Chest: Effort normal and breath sounds normal. No stridor. No respiratory distress. She has no decreased breath sounds. She has no wheezes. She has no rhonchi. She has no rales.  Abdominal: Soft. Normal appearance and bowel sounds are normal. She exhibits no distension. There is no tenderness. There is no rebound and no CVA tenderness.  Musculoskeletal: Normal range of motion. She exhibits no edema or tenderness.  Neurological: She is alert and oriented to person, place, and time. She has normal strength. No cranial nerve deficit  or sensory deficit. GCS eye subscore is 4. GCS verbal subscore is 5. GCS motor subscore is 6.  Skin: Skin is warm and dry. No abrasion and no rash noted.  Psychiatric: She has a normal mood and affect. Her speech is normal and behavior is normal.  Nursing note and vitals reviewed.    ED Treatments / Results  Labs (all labs ordered are listed, but only abnormal results are displayed) Labs Reviewed  CBC WITH DIFFERENTIAL/PLATELET  COMPREHENSIVE METABOLIC PANEL  LIPASE, BLOOD    EKG None  Radiology No results found.  Procedures Procedures (including critical care time)  Medications Ordered in ED Medications  0.9 %  sodium chloride infusion (has no administration in time range)     Initial Impression / Assessment and Plan / ED Course  I have reviewed the triage vital signs and the nursing notes.  Pertinent labs & imaging results that were available during my care of the patient were reviewed by me and considered in my medical decision making (see chart for details).     Patient with sodium of 124 and potassium of 2.8 indicating hypo-natremia and hypokalemia.  Will replenish potassium.  Will IV hydrate and admit to the medicine service Final Clinical Impressions(s) / ED Diagnoses   Final diagnoses:  None    ED Discharge Orders    None       Lacretia Leigh, MD 12/27/17 1401

## 2017-12-27 NOTE — Progress Notes (Signed)
Patient states she does not wear CPAP anymore and does not want to wear one her.

## 2017-12-27 NOTE — ED Triage Notes (Signed)
Pt sent by provider related to sodium 128 and potassium 3.2; recently had n/v/d.

## 2017-12-27 NOTE — H&P (Signed)
History and Physical    [name] ZOX:[id number] DOB: 1934-11-25 DOA: 12/27/2017  PCP: Crist Infante, MD Patient coming from: Home  Chief Complaint: Abnormal lab  HPI: [name] is a 83 y.o. female with medical history significant of H fibrillation, CKD, GERD, hyperlipidemia, hypertension, OSA on CPAP, NSTEMI, stromal tumor of the stomach. Patient presented secondary to an abnormal lab from her PCPs office. She states that she has had some symptoms of nausea without vomiting. No confusion. Her sodium was 128 yesterday and went down to 126 today. She was advised to stop hydrochlorothiazide yesterday which she has not taken.  ED Course: Vitals: Afebrile, normal pulse, on room air, BP 150/80, on room air Labs: Sodium of 124, potassium of 2.8, chloride of 85, bilirubin of 1.5 Imaging: None Medications/Course: 10 meq of potassium, normal saline @125  ml/hr  Review of Systems: Review of Systems  Constitutional: Negative for chills and fever.  Gastrointestinal: Positive for nausea. Negative for abdominal pain, constipation, diarrhea and vomiting.  Neurological: Negative for dizziness, sensory change and loss of consciousness.  All other systems reviewed and are negative.   Past Medical History:  Diagnosis Date  . Arthritis   . Atrial fibrillation ([place])   . Bladder polyps   . Cancer ([place])    stomach  . Cataracts, bilateral   . Cervical polyp   . Chronic kidney disease    urinary incont-  . Coronary artery disease    minimal  . Dysrhythmia     AF  hx     dr Acie Fredrickson  . GERD (gastroesophageal reflux disease)   . Hiatal hernia   . Hyperlipidemia   . Hypertension   . Hypokalemia   . IBS (irritable bowel syndrome)   . IFG (impaired fasting glucose)   . Incontinence of urine   . MI, acute, non ST segment elevation ([place]) [id number]   cath  . OSA (obstructive sleep apnea)    hypoxia , not CO 2 retainer,   . OSA on CPAP 06/16/2013    Sleep study 1- 11-15 with an AHI of 61 , now  titrated to CPAP 9 cm water with 2 cm EPR.   . Osteopenia   . Stromal tumor of the stomach [facility])    cancer    Past Surgical History:  Procedure Laterality Date  . ANKLE FRACTURE SURGERY  2006   right, plate   . CARDIAC CATHETERIZATION  12/07/20210   Dr.Nahser,minor cad  . COLONOSCOPY    . COLONOSCOPY N/A January 28, 202016   Procedure: COLONOSCOPY;  Surgeon: Inda Castle, MD;  Location: [place];  Service: Endoscopy;  Laterality: N/A;  . ENTEROSCOPY N/A January 28, 202016   Procedure: ENTEROSCOPY;  Surgeon: Inda Castle, MD;  Location[facility] ENDOSCOPY;  Service: Endoscopy;  Laterality: N/A;  . EYE SURGERY     both cataracts  . KNEE ARTHROSCOPY Right 05/28/2012   Procedure: RIGHT KNEE ARTHROSCOPY PARTIAL MEDIAL AND LATERAL MENISECTOMY WITH CHONDROPLASTY;  Surgeon: Alta Corning, MD;  Location: [place];  Service: Orthopedics;  Laterality: Right;  . stromal stomach  tumor  03/26/2007   resection  . TOTAL KNEE ARTHROPLASTY Right 08/10/2013   Procedure: TOTAL KNEE ARTHROPLASTY;  Surgeon: Alta Corning, MD;  Location: [place];  Service: Orthopedics;  Laterality: Right;  . UPPER GI ENDOSCOPY       reports that she has never smoked. She has never used smokeless tobacco. She reports that she drinks about 2.0 standard drinks of alcohol per week. She reports  that she does not use drugs.  Allergies  Allergen Reactions  . Detrol [Tolterodine] Other (See Comments)    Critical reaction-per MD office  . Tranexamic Acid Other (See Comments)    Patient ineligible for Tranexamic acid due to hx of NSTEMI and CAD.  [name] Xarelto [Rivaroxaban] Other (See Comments)    Critical reaction  . Pradaxa [Dabigatran Etexilate Mesylate] Nausea And Vomiting and Other (See Comments)    Moderate reaction-per MD    Family History  Problem Relation Age of Onset  . Pancreatic cancer Paternal Grandmother   . Heart disease Paternal Grandfather        both sides of the family  . Diabetes Paternal Aunt    Prior  to Admission medications   Medication Sig Start Date End Date Taking? Authorizing Provider  amLODipine (NORVASC) 5 MG tablet Take 1 tablet (5 mg total) by mouth daily. *Please call and schedule a one year follow up appointment* 05/17/16  Yes Nahser, Wonda Cheng, MD  atorvastatin (LIPITOR) 40 MG tablet 1/2 tablet by mouth daily    Yes [provider]  bismuth subsalicylate (PEPTO BISMOL) 262 MG/15ML suspension Take 30 mLs by mouth every 6 (six) hours as needed (vomitting).   Yes [provider]  calcium carbonate (TUMS - DOSED IN MG ELEMENTAL CALCIUM) 500 MG chewable tablet Chew 1 tablet by mouth 3 (three) times daily as needed for indigestion or heartburn.   Yes [provider]  doxazosin (CARDURA) 4 MG tablet TAKE 1 TABLET AT BEDTIME 12/10/17  Yes Nahser, Wonda Cheng, MD  DULoxetine (CYMBALTA) 20 MG capsule TAKE 1 CAPSULE AT NIGHT FOR 7 NIGHTS THEN TAKE 2 CAPSULES AT NIGHT Patient taking differently: Take 20-40 mg by mouth at bedtime.  12/11/17  Yes Magnus Sinning, MD  ELIQUIS 5 MG TABS tablet Take 5 mg by mouth 2 (two) times daily. 04/07/15  Yes [provider]  esomeprazole (NEXIUM) 40 MG capsule Take 40 mg by mouth daily at 12 noon.   Yes [provider]  galantamine (RAZADYNE ER) 16 MG 24 hr capsule Take 16 mg by mouth daily with breakfast.   Yes [provider]  metoprolol succinate (TOPROL-XL) 100 MG 24 hr tablet Take 1 tablet (100 mg total) daily by mouth. Take with or immediately following a meal. 01/11/17  Yes Nahser, Wonda Cheng, MD  nitroGLYCERIN (NITROSTAT) 0.4 MG SL tablet Place 1 tablet (0.4 mg total) under the tongue every 5 (five) minutes as needed for chest pain. 12/17/16  Yes Kilroy, Luke K, PA-C  ondansetron (ZOFRAN-ODT) 4 MG disintegrating tablet Take 4 mg by mouth every 8 (eight) hours as needed for nausea or vomiting.   Yes [provider]  potassium chloride (K-DUR) 10 MEQ tablet Take 10 mEq by mouth daily.   Yes [provider]  potassium chloride SA (K-DUR,KLOR-CON) 20 MEQ tablet Take 1 tablet (20 mEq total) by mouth 2 (two) times daily. 04/27/16  Yes Davonna Belling, MD  ranitidine (ZANTAC) 150 MG tablet Take 150 mg by mouth daily.   Yes [provider]  diclofenac sodium (VOLTAREN) 1 % GEL Apply 4 g topically 4 (four) times daily. Patient not taking: Reported on 12/27/2017 11/12/17   Magnus Sinning, MD  galantamine (RAZADYNE ER) 8 MG 24 hr capsule Take 8 mg daily with breakfast by mouth.    [provider]  hydrochlorothiazide (HYDRODIURIL) 12.5 MG tablet Take 12.5 mg by mouth 2 (two) times daily. 06/17/17   [provider]  valsartan (DIOVAN) 160  MG tablet TAKE 1 TABLET BY MOUTH TWICE A DAY Patient not taking: Reported on 12/27/2017 06/13/15   Nahser, Wonda Cheng, MD    Physical Exam:  Physical Exam  Constitutional: She is oriented to person, place, and time. She appears well-developed and well-nourished. No distress.  HENT:  Mouth/Throat: Oropharynx is clear and moist.  Eyes: Pupils are equal, round, and reactive to light. Conjunctivae and EOM are normal.  Neck: Normal range of motion.  Cardiovascular: Normal rate and normal heart sounds. An irregularly irregular rhythm present.  No murmur heard. Pulmonary/Chest: Effort normal and breath sounds normal. No respiratory distress. She has no wheezes. She has no rales.  Abdominal: Soft. Bowel sounds are normal. She exhibits no distension. There is no tenderness. There is no rebound and no guarding.  Musculoskeletal: Normal range of motion. She exhibits no edema or tenderness.  Lymphadenopathy:    She has no cervical adenopathy.  Neurological: She is alert and oriented to person, place, and time.  Skin: Skin is warm and dry. Bruising (left leg) noted. She is not diaphoretic.  Psychiatric: She has a normal mood and affect.     Labs on Admission: I have personally reviewed following labs and imaging studies  CBC: Recent  Labs  Lab 12/27/17 1243  WBC 4.8  NEUTROABS 3.6  HGB 13.5  HCT 39.6  MCV 86.3  PLT 299    Basic Metabolic Panel: Recent Labs  Lab 12/27/17 1243  NA 124*  K 2.8*  CL 85*  CO2 24  GLUCOSE 88  BUN 23  CREATININE 0.82  CALCIUM 9.3   Liver Function Tests: Recent Labs  Lab 12/27/17 1243  AST 27  ALT 14  ALKPHOS 46  BILITOT 1.5*  PROT 7.3  ALBUMIN 4.6   Recent Labs  Lab 12/27/17 1243  LIPASE 45    Urine analysis:    Component Value Date/Time   COLORURINE YELLOW 09/08/2007 1024   APPEARANCEUR CLEAR 09/08/2007 1024   LABSPEC 1.019 09/08/2007 1024   PHURINE 6.5 09/08/2007 1024   GLUCOSEU NEGATIVE 09/08/2007 1024   HGBUR NEGATIVE 09/08/2007 1024   BILIRUBINUR NEGATIVE 09/08/2007 1024   KETONESUR NEGATIVE 09/08/2007 1024   PROTEINUR NEGATIVE 09/08/2007 1024   UROBILINOGEN 1.0 09/08/2007 1024   NITRITE NEGATIVE 09/08/2007 1024   LEUKOCYTESUR  09/08/2007 1024    NEGATIVE MICROSCOPIC NOT DONE ON URINES WITH NEGATIVE PROTEIN, BLOOD, LEUKOCYTES, NITRITE, OR GLUCOSE <1000 mg/dL.     Radiological Exams on Admission: No results found.  EKG: Independently reviewed. Atrial fibrillation. Inverted t-wave in lead III which is stable.  Assessment/Plan Principal Problem:   Hyponatremia Active Problems:   Dyslipidemia   GERD   Hypokalemia   OSA on CPAP   Chronic diastolic heart failure (HCC)   Chronic atrial fibrillation   Chronic anticoagulation  Hyponatremia In setting of hydrochlorothiazide. Good oral intake. No liver disease on recent CT scan. Patient is euvolemic on exam. -Discontinue hydrochlorothiazide -Continue IV fluids (75 ml/hr secondary to heart history) -BMP q6 hours  Chronic diastolic heart failure Last EF of 55-60 on 05/2015 Transthoracic Echocardiogram. History of grade 2 diastolic heart dysfunction -Daily weights/strict in/out -Valsartan  Hypokalemia Patient takes potassium supplementation as an outpatient. Currently low. Not on a loop  diuretic. On an ARB -Continue supplementation -Recheck BMP  Hyperlipidemia -Continue Lipitor  Essential hypertension Elevated. -Continue amlodipine, metoprolol and valsartan -Discontinue hydrochlorothiazide  GERD Takes Zantac and Nexium as an outpatient -Nexium while inpatient  Chronic atrial fibrillation Rate controlled. -Continue Eliquis (decreased to 2.5 mg  BID secondary to age and weight) and metoprolol  Hyperbilirubinemia Mild elevation. Recent CT (10/30) significant for normal liver.  Dementia -Continue Cymbalta and galantamine  OSA on CPAP -Continue CPAP qhs   DVT prophylaxis: Eliquis Code Status: Full code Family Communication: None at bedside Disposition Plan: Telemetry Consults called: None Admission status: Observation   Cordelia Poche, MD [facility] 12/27/2017, 2:26 PM  If 7PM-7AM, please contact night-coverage [email or web address] Password [email or web address]

## 2017-12-28 DIAGNOSIS — I482 Chronic atrial fibrillation, unspecified: Secondary | ICD-10-CM

## 2017-12-28 DIAGNOSIS — E785 Hyperlipidemia, unspecified: Secondary | ICD-10-CM

## 2017-12-28 DIAGNOSIS — Z888 Allergy status to other drugs, medicaments and biological substances status: Secondary | ICD-10-CM | POA: Diagnosis not present

## 2017-12-28 DIAGNOSIS — E871 Hypo-osmolality and hyponatremia: Secondary | ICD-10-CM | POA: Diagnosis present

## 2017-12-28 DIAGNOSIS — N189 Chronic kidney disease, unspecified: Secondary | ICD-10-CM | POA: Diagnosis present

## 2017-12-28 DIAGNOSIS — E876 Hypokalemia: Secondary | ICD-10-CM | POA: Diagnosis present

## 2017-12-28 DIAGNOSIS — Z7901 Long term (current) use of anticoagulants: Secondary | ICD-10-CM | POA: Diagnosis not present

## 2017-12-28 DIAGNOSIS — I252 Old myocardial infarction: Secondary | ICD-10-CM | POA: Diagnosis not present

## 2017-12-28 DIAGNOSIS — I5032 Chronic diastolic (congestive) heart failure: Secondary | ICD-10-CM | POA: Diagnosis present

## 2017-12-28 DIAGNOSIS — F0390 Unspecified dementia without behavioral disturbance: Secondary | ICD-10-CM | POA: Diagnosis present

## 2017-12-28 DIAGNOSIS — I2510 Atherosclerotic heart disease of native coronary artery without angina pectoris: Secondary | ICD-10-CM | POA: Diagnosis present

## 2017-12-28 DIAGNOSIS — K219 Gastro-esophageal reflux disease without esophagitis: Secondary | ICD-10-CM

## 2017-12-28 DIAGNOSIS — Z9989 Dependence on other enabling machines and devices: Secondary | ICD-10-CM | POA: Diagnosis not present

## 2017-12-28 DIAGNOSIS — G4733 Obstructive sleep apnea (adult) (pediatric): Secondary | ICD-10-CM | POA: Diagnosis present

## 2017-12-28 DIAGNOSIS — Z96651 Presence of right artificial knee joint: Secondary | ICD-10-CM | POA: Diagnosis present

## 2017-12-28 DIAGNOSIS — R17 Unspecified jaundice: Secondary | ICD-10-CM | POA: Diagnosis present

## 2017-12-28 DIAGNOSIS — Z79899 Other long term (current) drug therapy: Secondary | ICD-10-CM | POA: Diagnosis not present

## 2017-12-28 DIAGNOSIS — T502X5A Adverse effect of carbonic-anhydrase inhibitors, benzothiadiazides and other diuretics, initial encounter: Secondary | ICD-10-CM | POA: Diagnosis present

## 2017-12-28 DIAGNOSIS — I130 Hypertensive heart and chronic kidney disease with heart failure and stage 1 through stage 4 chronic kidney disease, or unspecified chronic kidney disease: Secondary | ICD-10-CM | POA: Diagnosis present

## 2017-12-28 LAB — CBC
HCT: 33.8 % — ABNORMAL LOW (ref 36.0–46.0)
Hemoglobin: 11.2 g/dL — ABNORMAL LOW (ref 12.0–15.0)
MCH: 29.3 pg (ref 26.0–34.0)
MCHC: 33.1 g/dL (ref 30.0–36.0)
MCV: 88.5 fL (ref 80.0–100.0)
PLATELETS: 178 10*3/uL (ref 150–400)
RBC: 3.82 MIL/uL — AB (ref 3.87–5.11)
RDW: 12.9 % (ref 11.5–15.5)
WBC: 3.9 10*3/uL — ABNORMAL LOW (ref 4.0–10.5)
nRBC: 0 % (ref 0.0–0.2)

## 2017-12-28 LAB — BASIC METABOLIC PANEL
ANION GAP: 8 (ref 5–15)
ANION GAP: 7 (ref 5–15)
BUN: 17 mg/dL (ref 8–23)
BUN: 14 mg/dL (ref 8–23)
CALCIUM: 8.7 mg/dL — AB (ref 8.9–10.3)
CHLORIDE: 101 mmol/L (ref 98–111)
CHLORIDE: 98 mmol/L (ref 98–111)
CO2: 22 mmol/L (ref 22–32)
CO2: 25 mmol/L (ref 22–32)
CREATININE: 0.56 mg/dL (ref 0.44–1.00)
Calcium: 8.7 mg/dL — ABNORMAL LOW (ref 8.9–10.3)
Creatinine, Ser: 0.62 mg/dL (ref 0.44–1.00)
GFR calc Af Amer: >60 mL/min (ref >60)
GFR calc Af Amer: >60 mL/min (ref >60)
GFR calc non Af Amer: >60 mL/min (ref >60)
GLUCOSE: 82 mg/dL (ref 70–99)
Glucose, Bld: 88 mg/dL (ref 70–99)
POTASSIUM: 3.8 mmol/L (ref 3.5–5.1)
Potassium: 3.7 mmol/L (ref 3.5–5.1)
Sodium: 131 mmol/L — ABNORMAL LOW (ref 135–145)
Sodium: 130 mmol/L — ABNORMAL LOW (ref 135–145)

## 2017-12-28 LAB — MAGNESIUM: Magnesium: 1.8 mg/dL (ref 1.7–2.4)

## 2017-12-28 NOTE — Evaluation (Signed)
Physical Therapy Evaluation Patient Details Name: [name] MRN: [id number] DOB: 1934-11-06 Today's Date: 12/28/2017   History of Present Illness  [name] is a 83 y.o. female with medical history significant of H fibrillation, CKD, GERD, hyperlipidemia, hypertension, OSA on CPAP, NSTEMI, stromal tumor of the stomach. Patient presented secondary to an abnormal lab from her PCPs office. She states that she has had some symptoms of nausea without vomiting. No confusion. Her sodium was 128 yesterday and went down to 126 today. She was advised to stop hydrochlorothiazide yesterday which she has not taken.  Clinical Impression  Pt presents with confusion and decreased safety awareness affecting her independence. Pt demonstrated decreased problem solving,  deficits with short term memory, and  decreased balance. Pt had a recent fall. Pt acknowledges some increased confusion, but does not feel she needs to have help with mobility.  Pt was min guard assist with RW on the unit requiring assistance with walker management to avoid obstacles. Pt needed mod assist to complete a simple path finding task. Feel pt would benefit from continued skilled PT to focus on cognition, balance and safety awareness. Pt reports she does not want to increase her level of care at [place]. I recommend supervision with mobility due to decrease cognition and safety awareness. If pt refuses SNF or increased level of care at ALF then recommend HHPT services.   Follow Up Recommendations Supervision for mobility/OOB;Supervision/Assistance - 24 hour;SNF    Equipment Recommendations  Rolling walker with 5" wheels    Recommendations for Other Services       Precautions / Restrictions Precautions Precautions: Fall Precaution Comments: bed alarm Restrictions Weight Bearing Restrictions: No      Mobility  Bed Mobility Overal bed mobility: Modified Independent                Transfers Overall transfer level:  Needs assistance Equipment used: Rolling walker (2 wheeled) Transfers: Sit to/from [employer] Transfers Sit to Stand: Supervision Stand pivot transfers: Supervision       General transfer comment: cues for hand placement for improved safety  Ambulation/Gait Ambulation/Gait assistance: Min guard Gait Distance (Feet): 200 Feet Assistive device: Rolling walker (2 wheeled) Gait Pattern/deviations: Step-through pattern;Trunk flexed Gait velocity: decreased   General Gait Details: Verbal cues to avoid obsticles in the hall with RW, mod cues for path finding task to assess cognition, no LOB with basic ambulation however, needed assistance with walker management and to locate room.  Stairs            Wheelchair Mobility    Modified Rankin (Stroke Patients Only)       Balance Overall balance assessment: History of Falls;Needs assistance   Sitting balance-Leahy Scale: Good     Standing balance support: Bilateral upper extremity supported Standing balance-Leahy Scale: Fair                               Pertinent Vitals/Pain Pain Assessment: Faces Faces Pain Scale: Hurts a little bit Pain Location: back Pain Descriptors / Indicators: Discomfort Pain Intervention(s): Limited activity within patient's tolerance;Monitored during session    Home Living Family/patient expects to be discharged to:: Assisted living Living Arrangements: Alone             Home Equipment: Cane - single point Additional Comments: [facility]- independent living    Prior Function Level of Independence: Needs assistance   Gait / Transfers Assistance Needed: previous fall, was independent  with gait PTA     Comments: received 1 meal at ALF per day     Hand Dominance        Extremity/Trunk Assessment   Upper Extremity Assessment Upper Extremity Assessment: Defer to OT evaluation    Lower Extremity Assessment Lower Extremity Assessment: Overall WFL for tasks  assessed    Cervical / Trunk Assessment Cervical / Trunk Assessment: Kyphotic  Communication   Communication: No difficulties  Cognition Arousal/Alertness: Awake/alert   Overall Cognitive Status: Impaired/Different from baseline Area of Impairment: Problem solving;Awareness;Memory;Safety/judgement                     Memory: Decreased short-term memory   Safety/Judgement: Decreased awareness of safety Awareness: Intellectual Problem Solving: Slow processing;Requires verbal cues General Comments: Pt reports recent forgetfullness, recent fall, vision a little "cloudy" even with glasses      General Comments      Exercises     Assessment/Plan    PT Assessment Patient needs continued PT services  PT Problem List Decreased mobility;Decreased safety awareness;Decreased activity tolerance;Decreased cognition;Decreased balance;Decreased knowledge of use of DME       PT Treatment Interventions DME instruction;Therapeutic activities;Cognitive remediation;Gait training;Therapeutic exercise;Patient/family education;Balance training;Functional mobility training    PT Goals (Current goals can be found in the Care Plan section)  Acute Rehab PT Goals Patient Stated Goal: To return back to indpependent living PT Goal Formulation: With patient Time For Goal Achievement: 01/11/18 Potential to Achieve Goals: Fair    Frequency Min 3X/week   Barriers to discharge Other (comment) pt was Independent and wants to return to apt without increasing caregiver support    Co-evaluation               AM-PAC PT "6 Clicks" Daily Activity  Outcome Measure Difficulty turning over in bed (including adjusting bedclothes, sheets and blankets)?: None Difficulty moving from lying on back to sitting on the side of the bed? : A Little Difficulty sitting down on and standing up from a chair with arms (e.g., wheelchair, bedside commode, etc,.)?: A Little Help needed moving to and from a bed  to chair (including a wheelchair)?: A Little Help needed walking in hospital room?: A Little Help needed climbing 3-5 steps with a railing? : A Little 6 Click Score: 19    End of Session Equipment Utilized During Treatment: Gait belt Activity Tolerance: Patient tolerated treatment well Patient left: in bed;with call bell/phone within reach;with bed alarm set Nurse Communication: Mobility status PT Visit Diagnosis: Unsteadiness on feet (R26.81);History of falling (Z91.81)    Time: 8099-8338 PT Time Calculation (min) (ACUTE ONLY): 33 min   Charges:   PT Evaluation $PT Eval Moderate Complexity: 1 Mod PT Treatments $Gait Training: 8-22 mins       Theodoro Grist, PT  Lelon Mast 12/28/2017, 9:38 AM

## 2017-12-28 NOTE — Progress Notes (Signed)
PROGRESS NOTE    [name]  ERX:[id number] DOB: [birth year] DOA: 12/27/2017 PCP: Crist Infante, MD   Brief Narrative:  [name] is a 83 y.o. female with medical history significant of H fibrillation, CKD, GERD, hyperlipidemia, hypertension, OSA on CPAP, NSTEMI, stromal tumor of the stomach. Patient presented secondary to an abnormal lab from her PCPs office. She states that she has had some symptoms of nausea without vomiting. No confusion. Her sodium was 128 yesterday and went down to 126 today. She was advised to stop hydrochlorothiazide yesterday which she has not taken.  ED Course: Vitals: Afebrile, normal pulse, on room air, BP 150/80, on room air Labs: Sodium of 124, potassium of 2.8, chloride of 85, bilirubin of 1.5 Imaging: None Medications/Course: 10 meq of potassium, normal saline @125  ml/hr   Assessment & Plan:   Principal Problem:   Hyponatremia Active Problems:   Dyslipidemia   GERD   Hypokalemia   OSA on CPAP   Chronic diastolic heart failure (HCC)   Chronic atrial fibrillation   Chronic anticoagulation  #1 hyponatremia Likely secondary to hypovolemic hyponatremia in the setting of HCTZ.  Sodium levels improving.  Patient had had some poor oral intake prior to admission and patient on HCTZ.  Patient had a recent CT scan which was negative for any liver disease.  HCTZ has been discontinued.  Continue hydration with IV fluids.  Follow.  2.  Chronic diastolic heart failure Last EF of 55 to 60% on April 2017.  Grade 2 diastolic dysfunction.  Monitor volume status closely with hydration.  Continue statin, metoprolol.  3.  Hypokalemia Magnesium is 1.8.  Potassium repleted.  Follow.  4.  Gastroesophageal reflux disease PPI.  5.  Obstructive sleep apnea CPAP nightly.  6.  Chronic atrial fibrillation Currently rate controlled on metoprolol.  Eliquis for anticoagulation.  7.  Hyperlipidemia Continue statin.  8.  Hypertension Blood pressure stable.  HCTZ  has been discontinued and will not resume secondary to problem #1.  Continue current regimen of amlodipine, metoprolol.  9.  Dementia Stable.  Continue Cymbalta and galantamine.   DVT prophylaxis: Eliquis Code Status: Full Family Communication: Updated patient.  No family at bedside. Disposition Plan: Back to assisted living facility with home health therapies hopefully in 24 hours.   Consultants:   None  Procedures:  None  Antimicrobials:   None   Subjective: Patient sitting up in bed about to eat lunch.  Denies any emesis.  Feels some improvement with nausea.  Alert and oriented to self place and time.  Denies any chest pain no shortness of breath.  Denies any lightheadedness or dizziness.  Objective: Vitals:   12/27/17 1533 12/27/17 2011 12/28/17 0641 12/28/17 1450  BP: (!) 150/80 124/76 138/81 126/84  Pulse: 92 79 79 75  Resp: 20 18 18 20   Temp: 98.1 F (36.7 C) 98.8 F (37.1 C) 98.6 F (37 C) 97.9 F (36.6 C)  TempSrc: Oral Oral Oral Oral  SpO2: 99% 97% 96% 97%  Weight:        Intake/Output Summary (Last 24 hours) at 12/28/2017 1655 Last data filed at 12/28/2017 1331 Gross per 24 hour  Intake 2443.85 ml  Output 225 ml  Net 2218.85 ml   Filed Weights   12/27/17 1531  Weight: 56.2 kg    Examination:  General exam: Appears calm and comfortable.  Dry mucous membranes. Respiratory system: Clear to auscultation. Respiratory effort normal. Cardiovascular system: S1 & S2 heard, RRR. No JVD, murmurs, rubs, gallops  or clicks. No pedal edema. Gastrointestinal system: Abdomen is nondistended, soft and nontender. No organomegaly or masses felt. Normal bowel sounds heard. Central nervous system: Alert and oriented. No focal neurological deficits. Extremities: Symmetric 5 x 5 power. Skin: No rashes, lesions or ulcers Psychiatry: Judgement and insight appear normal. Mood & affect appropriate.     Data Reviewed: I have personally reviewed following labs and  imaging studies  CBC: Recent Labs  Lab 12/27/17 1243 12/28/17 0430  WBC 4.8 3.9*  NEUTROABS 3.6  --   HGB 13.5 11.2*  HCT 39.6 33.8*  MCV 86.3 88.5  PLT 181 157   Basic Metabolic Panel: Recent Labs  Lab 12/27/17 1243 12/27/17 1652 12/27/17 2235 12/28/17 0430 12/28/17 1105  NA 124* 128* 127* 131* 130*  K 2.8* 2.9* 3.7 3.7 3.8  CL 85* 92* 96* 101 98  CO2 24 24 24 22 25   GLUCOSE 88 109* 89 82 88  BUN 23 17 20 17 14   CREATININE 0.82 0.77 0.68 0.56 0.62  CALCIUM 9.3 9.1 8.8* 8.7* 8.7*  MG  --   --   --  1.8  --    GFR: Estimated Creatinine Clearance: 48.7 mL/min (by C-G formula based on SCr of 0.62 mg/dL). Liver Function Tests: Recent Labs  Lab 12/27/17 1243  AST 27  ALT 14  ALKPHOS 46  BILITOT 1.5*  PROT 7.3  ALBUMIN 4.6   Recent Labs  Lab 12/27/17 1243  LIPASE 45   No results for input(s): AMMONIA in the last 168 hours. Coagulation Profile: No results for input(s): INR, PROTIME in the last 168 hours. Cardiac Enzymes: No results for input(s): CKTOTAL, CKMB, CKMBINDEX, TROPONINI in the last 168 hours. BNP (last 3 results) No results for input(s): PROBNP in the last 8760 hours. HbA1C: No results for input(s): HGBA1C in the last 72 hours. CBG: No results for input(s): GLUCAP in the last 168 hours. Lipid Profile: No results for input(s): CHOL, HDL, LDLCALC, TRIG, CHOLHDL, LDLDIRECT in the last 72 hours. Thyroid Function Tests: No results for input(s): TSH, T4TOTAL, FREET4, T3FREE, THYROIDAB in the last 72 hours. Anemia Panel: No results for input(s): VITAMINB12, FOLATE, FERRITIN, TIBC, IRON, RETICCTPCT in the last 72 hours. Sepsis Labs: No results for input(s): PROCALCITON, LATICACIDVEN in the last 168 hours.  No results found for this or any previous visit (from the past 240 hour(s)).       Radiology Studies: No results found.      Scheduled Meds: . amLODipine  5 mg Oral Daily  . apixaban  2.5 mg Oral BID  . atorvastatin  20 mg Oral q1800   . chlorhexidine  15 mL Mouth Rinse BID  . doxazosin  4 mg Oral QHS  . DULoxetine  20 mg Oral QHS  . feeding supplement (ENSURE ENLIVE)  237 mL Oral BID BM  . galantamine  16 mg Oral Q breakfast  . mouth rinse  15 mL Mouth Rinse q12n4p  . metoprolol succinate  100 mg Oral Daily  . pantoprazole  40 mg Oral Daily   Continuous Infusions: . sodium chloride 75 mL/hr at 12/28/17 1452     LOS: 0 days    Time spent: 35 minutes    Irine Seal, MD [facility] Pager [phone]  If 7PM-7AM, please contact night-coverage [email or web address] Password [email or web address] 12/28/2017, 4:55 PM

## 2017-12-29 LAB — BASIC METABOLIC PANEL
Anion gap: 8 (ref 5–15)
BUN: 10 mg/dL (ref 8–23)
CO2: 26 mmol/L (ref 22–32)
CREATININE: 0.5 mg/dL (ref 0.44–1.00)
Calcium: 8.6 mg/dL — ABNORMAL LOW (ref 8.9–10.3)
Chloride: 100 mmol/L (ref 98–111)
GFR calc non Af Amer: >60 mL/min (ref >60)
Glucose, Bld: 91 mg/dL (ref 70–99)
Potassium: 3.2 mmol/L — ABNORMAL LOW (ref 3.5–5.1)
SODIUM: 134 mmol/L — AB (ref 135–145)

## 2017-12-29 LAB — MAGNESIUM: Magnesium: 1.6 mg/dL — ABNORMAL LOW (ref 1.7–2.4)

## 2017-12-29 MED ORDER — ENSURE ENLIVE PO LIQD: 237.0000 mL | Freq: Two times a day (BID) | ORAL | 0 refills | Status: AC

## 2017-12-29 MED ORDER — POTASSIUM CHLORIDE CRYS ER 20 MEQ PO TBCR
40.0000 meq | EXTENDED_RELEASE_TABLET | ORAL | Status: DC
  Administered 2017-12-29: 40 meq via ORAL
  Filled 2017-12-29: qty 2

## 2017-12-29 MED ORDER — APIXABAN 2.5 MG PO TABS: 2.5000 mg | ORAL_TABLET | Freq: Two times a day (BID) | ORAL | 0 refills | Status: AC

## 2017-12-29 NOTE — Progress Notes (Signed)
Nutrition Brief Note  RD consulted for nutritional assessment. Pt consuming 80-100% of meals. Pt with stable weight.  Wt Readings from Last 15 Encounters:  12/29/17 60.8 kg  12/06/17 60.4 kg  11/12/17 61.2 kg  09/25/17 59 kg  09/16/17 59 kg  08/22/17 61.3 kg  08/12/17 59 kg  01/11/17 60.8 kg  01/08/17 62.6 kg  12/17/16 62.6 kg  11/06/16 64.6 kg  05/30/15 74.8 kg  03/08/15 73.2 kg  02/09/15 74.1 kg  08/26/14 72.6 kg    Body mass index is 22.65 kg/m. Patient meets criteria for normal based on current BMI.   Current diet order is heart healthy, patient is consuming approximately 80-100% of meals at this time. Labs and medications reviewed.   No nutrition interventions warranted at this time. If nutrition issues arise, please consult RD.   Clayton Bibles, MS, RD, [place] Dietitian Pager: [phone] After Hours Pager: [phone]

## 2017-12-29 NOTE — Discharge Summary (Signed)
Physician Discharge Summary  [name] UUV:[id number] DOB: 10-May-1934 DOA: 12/27/2017  PCP: Crist Infante, MD  Admit date: 12/27/2017 Discharge date: 12/29/2017  Time spent: 55 minutes  Recommendations for Outpatient Follow-up:  1. Follow-up with Crist Infante, MD in 1 to 2 weeks.  On follow-up patient will need a basic metabolic profile done to follow-up on electrolytes and renal function.  Patient's HCTZ has permanently been discontinued.  Patient's blood pressure need to be reassessed per PCP.   Discharge Diagnoses:  Principal Problem:   Hyponatremia Active Problems:   Dyslipidemia   GERD   Hypokalemia   OSA on CPAP   Chronic diastolic heart failure (HCC)   Chronic atrial fibrillation   Chronic anticoagulation   Discharge Condition: Stable and improved  Diet recommendation: Heart healthy  Filed Weights   12/27/17 1531 12/29/17 0505  Weight: 56.2 kg 60.8 kg    History of present illness:  Per Dr. Andee Lineman RUCHEL [name] is a 83 y.o. female with medical history significant of H fibrillation, CKD, GERD, hyperlipidemia, hypertension, OSA on CPAP, NSTEMI, stromal tumor of the stomach. Patient presented secondary to an abnormal lab from her PCPs office. She stated that she has had some symptoms of nausea without vomiting. No confusion. Her sodium was 128, one day prior to admission and went down to 126 on the day of admission. She was advised to stop hydrochlorothiazide yesterday which she had not taken.  ED Course: Vitals: Afebrile, normal pulse, on room air, BP 150/80, on room air Labs: Sodium of 124, potassium of 2.8, chloride of 85, bilirubin of 1.5 Imaging: None Medications/Course: 10 meq of potassium, normal saline @125  ml/hr  Hospital Course:  1 hyponatremia Likely secondary to hypovolemic hyponatremia in the setting of HCTZ.    Patient was admitted placed on IV fluids and monitored.  Patient's HCTZ was discontinued. Patient had had poor oral intake prior to admission  and patient on HCTZ.  Patient had a recent CT scan which was negative for any liver disease.  HCTZ has been discontinued.  Hyponatremia improved with hydration such that by day of discharge patient sodium level was up to 134 from  124 on admission.  Patient be discharged in stable and improved condition with close outpatient follow-up with PCP.    2.  Chronic diastolic heart failure Last EF of 55 to 60% on April 2017.  Grade 2 diastolic dysfunction.  Patient remained euvolemic during the hospitalization.  Patient maintained on home regimen of statin, metoprolol.  3.  Hypokalemia Magnesium is 1.8.  Potassium repleted.    Outpatient follow-up.  4.  Gastroesophageal reflux disease Patient maintained on a PPI.  5.  Obstructive sleep apnea CPAP nightly.  6.  Chronic atrial fibrillation Remained rate controlled on metoprolol.  Eliquis was changed to 2.5 mg twice daily for anticoagulation per pharmacy recommendations.  7.  Hyperlipidemia Continued on a statin.  8.  Hypertension Blood pressure stable.  HCTZ has been discontinued and will not resume secondary to problem #1.    Patient maintained on home regimen of amlodipine, metoprolol.  9.  Dementia Stable.  Continued on home regimen of Cymbalta and galantamine.   Procedures:  None  Consultations:  None  Discharge Exam: Vitals:   12/28/17 2133 12/29/17 0553  BP: 114/72 (!) 148/95  Pulse: 75 77  Resp: 17 18  Temp: 98.4 F (36.9 C) 98.2 F (36.8 C)  SpO2: 97% 96%    General: NAD Cardiovascular: RRR Respiratory: CTAB  Discharge Instructions   Discharge  Instructions    Diet - low sodium heart healthy   Complete by:  As directed    Increase activity slowly   Complete by:  As directed      Allergies as of 12/29/2017      Reactions   Detrol [tolterodine] Other (See Comments)   Critical reaction-per MD office   Tranexamic Acid Other (See Comments)   Patient ineligible for Tranexamic acid due to hx of NSTEMI  and CAD.   Xarelto [rivaroxaban] Other (See Comments)   Critical reaction   Pradaxa [dabigatran Etexilate Mesylate] Nausea And Vomiting, Other (See Comments)   Moderate reaction-per MD      Medication List    STOP taking these medications   diclofenac sodium 1 % Gel Commonly known as:  VOLTAREN   potassium chloride 10 MEQ tablet Commonly known as:  K-DUR   valsartan 160 MG tablet Commonly known as:  DIOVAN     TAKE these medications   amLODipine 5 MG tablet Commonly known as:  NORVASC Take 1 tablet (5 mg total) by mouth daily. *Please call and schedule a one year follow up appointment*   apixaban 2.5 MG Tabs tablet Commonly known as:  ELIQUIS Take 1 tablet (2.5 mg total) by mouth 2 (two) times daily. What changed:    medication strength  how much to take   atorvastatin 40 MG tablet Commonly known as:  LIPITOR 1/2 tablet by mouth daily   bismuth subsalicylate 810 FB/51WC suspension Commonly known as:  PEPTO BISMOL Take 30 mLs by mouth every 6 (six) hours as needed (vomitting).   calcium carbonate 500 MG chewable tablet Commonly known as:  TUMS - dosed in mg elemental calcium Chew 1 tablet by mouth 3 (three) times daily as needed for indigestion or heartburn.   doxazosin 4 MG tablet Commonly known as:  CARDURA TAKE 1 TABLET AT BEDTIME   DULoxetine 20 MG capsule Commonly known as:  CYMBALTA TAKE 1 CAPSULE AT NIGHT FOR 7 NIGHTS THEN TAKE 2 CAPSULES AT NIGHT What changed:  See the new instructions.   esomeprazole 40 MG capsule Commonly known as:  NEXIUM Take 40 mg by mouth daily at 12 noon.   feeding supplement (ENSURE ENLIVE) Liqd Take 237 mLs by mouth 2 (two) times daily between meals.   galantamine 16 MG 24 hr capsule Commonly known as:  RAZADYNE ER Take 16 mg by mouth daily with breakfast. What changed:  Another medication with the same name was removed. Continue taking this medication, and follow the directions you see here.   metoprolol succinate  100 MG 24 hr tablet Commonly known as:  TOPROL-XL Take 1 tablet (100 mg total) daily by mouth. Take with or immediately following a meal.   nitroGLYCERIN 0.4 MG SL tablet Commonly known as:  NITROSTAT Place 1 tablet (0.4 mg total) under the tongue every 5 (five) minutes as needed for chest pain.   ondansetron 4 MG disintegrating tablet Commonly known as:  ZOFRAN-ODT Take 4 mg by mouth every 8 (eight) hours as needed for nausea or vomiting.   potassium chloride SA 20 MEQ tablet Commonly known as:  K-DUR,KLOR-CON Take 1 tablet (20 mEq total) by mouth 2 (two) times daily.   ranitidine 150 MG tablet Commonly known as:  ZANTAC Take 150 mg by mouth daily.            Durable Medical Equipment  (From admission, onward)         Start     Ordered   12/28/17  1148  For home use only DME 4 wheeled rolling walker with seat  Once    Question:  Patient needs a walker to treat with the following condition  Answer:  Debility   12/28/17 1147         Allergies  Allergen Reactions  . Detrol [Tolterodine] Other (See Comments)    Critical reaction-per MD office  . Tranexamic Acid Other (See Comments)    Patient ineligible for Tranexamic acid due to hx of NSTEMI and CAD.  [name] Xarelto [Rivaroxaban] Other (See Comments)    Critical reaction  . Pradaxa [Dabigatran Etexilate Mesylate] Nausea And Vomiting and Other (See Comments)    Moderate reaction-per MD   Follow-up Information    [facility] Follow up.   Specialty:  Home Health Services Why:  Home Health RN, Physical Therapy, aide -agency will call to arrange initial appointment Contact information: [address] [place] [address] [phone]        Crist Infante, MD. Schedule an appointment as soon as possible for a visit in 1 week(s).   Specialty:  Internal Medicine Why:  f/u in 1-2 weeks. Contact information: [place] [address] [phone]        Nahser, Wonda Cheng, MD .    Specialty:  Cardiology Contact information: [place] [address] [phone]            The results of significant diagnostics from this hospitalization (including imaging, microbiology, ancillary and laboratory) are listed below for reference.    Significant Diagnostic Studies: No results found.  Microbiology: No results found for this or any previous visit (from the past 240 hour(s)).   Labs: Basic Metabolic Panel: Recent Labs  Lab 12/27/17 1652 12/27/17 2235 12/28/17 0430 12/28/17 1105 12/29/17 0520 12/29/17 0522  NA 128* 127* 131* 130*  --  134*  K 2.9* 3.7 3.7 3.8  --  3.2*  CL 92* 96* 101 98  --  100  CO2 24 24 22 25   --  26  GLUCOSE 109* 89 82 88  --  91  BUN 17 20 17 14   --  10  CREATININE 0.77 0.68 0.56 0.62  --  0.50  CALCIUM 9.1 8.8* 8.7* 8.7*  --  8.6*  MG  --   --  1.8  --  1.6*  --    Liver Function Tests: Recent Labs  Lab 12/27/17 1243  AST 27  ALT 14  ALKPHOS 46  BILITOT 1.5*  PROT 7.3  ALBUMIN 4.6   Recent Labs  Lab 12/27/17 1243  LIPASE 45   No results for input(s): AMMONIA in the last 168 hours. CBC: Recent Labs  Lab 12/27/17 1243 12/28/17 0430  WBC 4.8 3.9*  NEUTROABS 3.6  --   HGB 13.5 11.2*  HCT 39.6 33.8*  MCV 86.3 88.5  PLT 181 178   Cardiac Enzymes: No results for input(s): CKTOTAL, CKMB, CKMBINDEX, TROPONINI in the last 168 hours. BNP: BNP (last 3 results) No results for input(s): BNP in the last 8760 hours.  ProBNP (last 3 results) No results for input(s): PROBNP in the last 8760 hours.  CBG: No results for input(s): GLUCAP in the last 168 hours.     Signed:  Irine Seal MD.  [facility] 12/29/2017, 2:29 PM

## 2017-12-29 NOTE — Care Management Note (Signed)
Case Management Note  Patient Details  Name: [name] MRN: [id number] Date of Birth: [birth year]  Subjective/Objective:    Hyponatremia, CHF                Action/Plan: NCM spoke to pt and offered choice for HH/list provided. Pt agreeable to [facility] for HH. Contacted [facility] for [place] and Rolling Walker with seat. Pt lives at [place] apt.   Expected Discharge Date:  12/29/2017             Expected Discharge Plan:  [place]  In-House Referral:  NA  Discharge planning Services  CM Consult  Post Acute Care Choice:  Home Health Choice offered to:  Patient  DME Arranged:  Walker rolling with seat DME Agency:  [place] Arranged:  RN, PT, OT, Nurse's Aide, Social Work [employer] Agency:  [place]  Status of Service:  Completed, signed off  If discussed at [employer], dates discussed:    Additional Comments:  Erenest Rasher, RN 12/29/2017, 1:29 PM

## 2018-01-16 ENCOUNTER — Ambulatory Visit (INDEPENDENT_AMBULATORY_CARE_PROVIDER_SITE_OTHER): Payer: Self-pay

## 2018-01-16 ENCOUNTER — Ambulatory Visit (INDEPENDENT_AMBULATORY_CARE_PROVIDER_SITE_OTHER): Payer: Medicare Other | Admitting: Physical Medicine and Rehabilitation

## 2018-01-16 ENCOUNTER — Encounter (INDEPENDENT_AMBULATORY_CARE_PROVIDER_SITE_OTHER): Payer: Self-pay | Admitting: Physical Medicine and Rehabilitation

## 2018-01-16 VITALS — BP 126/74 | HR 80 | Ht 65.0 in | Wt 140.0 lb

## 2018-01-16 DIAGNOSIS — M5116 Intervertebral disc disorders with radiculopathy, lumbar region: Secondary | ICD-10-CM

## 2018-01-16 DIAGNOSIS — M5416 Radiculopathy, lumbar region: Secondary | ICD-10-CM

## 2018-01-16 DIAGNOSIS — M25562 Pain in left knee: Secondary | ICD-10-CM

## 2018-01-16 DIAGNOSIS — M4317 Spondylolisthesis, lumbosacral region: Secondary | ICD-10-CM

## 2018-01-16 DIAGNOSIS — M25561 Pain in right knee: Secondary | ICD-10-CM | POA: Diagnosis not present

## 2018-01-16 DIAGNOSIS — M12571 Traumatic arthropathy, right ankle and foot: Secondary | ICD-10-CM

## 2018-01-16 DIAGNOSIS — M25571 Pain in right ankle and joints of right foot: Secondary | ICD-10-CM

## 2018-01-16 NOTE — Progress Notes (Signed)
 .  Numeric Pain Rating Scale and Functional Assessment Average Pain 7 Pain Right Now 4 My pain is constant, dull and aching Pain is worse with: walking, bending and sitting Pain improves with: rest   In the last MONTH (on 0-10 scale) has pain interfered with the following?  1. General activity like being  able to carry out your everyday physical activities such as walking, climbing stairs, carrying groceries, or moving a chair?  Rating(6)  2. Relation with others like being able to carry out your usual social activities and roles such as  activities at home, at work and in your community. Rating(6)  3. Enjoyment of life such that you have  been bothered by emotional problems such as feeling anxious, depressed or irritable?  Rating(7)

## 2018-02-07 ENCOUNTER — Encounter (INDEPENDENT_AMBULATORY_CARE_PROVIDER_SITE_OTHER): Payer: Self-pay | Admitting: Physical Medicine and Rehabilitation

## 2018-02-07 NOTE — Progress Notes (Signed)
[name] - 83 y.o. female MRN [id number]  Date of birth: 08-26-1934  Office Visit Note: Visit Date: 01/16/2018 PCP: Sabrina Infante, MD Referred by: Sabrina Infante, MD  Subjective: Chief Complaint  Patient presents with  . Lower Back - Pain  . Left Knee - Pain  . Right Knee - Pain  . Right Ankle - Pain   HPI: [name] is a 83 y.o. female who comes in today For evaluation management of chronic low back pain and right hip and leg pain but now with complaints of bilateral knee pain and continued right ankle pain.  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  She has seen Dr. Sharol Given in our office with injection treatment and evaluation and management and has not felt like it is helped that much.  She has not seen a podiatrist.  Her case is complicated by memory difficulties and early dementia.  She denies any left ankle pain.  Second problem she is having is today bilateral knee pain.  She reports a recent flareup of knee pain across both knees worse with standing and walking.  She has not noted any locking or clicking or trauma.  She denies any swelling.  She has had prior right total knee replacement by Dr. Janith Lima orthopedics.  Not currently seeing orthopedics for her knees.  No recent x-ray.  In terms of her right-sided low back and hip and leg pain she is actually doing much better although it is kind of hard to convince her that she is doing better than when I first saw her.  Her history is that she had a couple of injections at [place] which appeared to be well placed but did not seem to help.  Now I am finding that was very hard for her to really remember how long they helped because been running the same issue.  We have documented really good relief with transforaminal injection at least in our notes that seem to last for a few months that do not give complete relief but do give her  significant relief.  She has bilateral pars defects with grade 1 listhesis of L5 on S1 with more severe right foraminal narrowing which I do think is the cause of the pain.  We have started her on Cymbalta and she is tolerating that well.  I think that may help if we can get her up to a bigger dose.  We will work on that.  Review of Systems  Constitutional: Negative for chills, fever, malaise/fatigue and weight loss.  HENT: Negative for hearing loss and sinus pain.   Eyes: Negative for blurred vision, double vision and photophobia.  Respiratory: Negative for cough and shortness of breath.   Cardiovascular: Negative for chest pain, palpitations and leg swelling.  Gastrointestinal: Negative for abdominal pain, nausea and vomiting.  Genitourinary: Negative for flank pain.  Musculoskeletal: Positive for back pain and joint pain. Negative for myalgias.  Skin: Negative for itching and rash.  Neurological: Negative for tremors, focal weakness and weakness.  Endo/Heme/Allergies: Negative.   Psychiatric/Behavioral: Negative for depression.  All other systems reviewed and are negative.  Otherwise per HPI.  Assessment & Plan: Visit Diagnoses:  1. Right knee pain, unspecified chronicity   2. Left knee pain, unspecified chronicity   3. Lumbar radiculopathy   4. Radiculopathy due to lumbar intervertebral disc disorder   5. Spondylolisthesis of lumbosacral  region   6. Pain in right ankle and joints of right foot   7. Traumatic arthritis of ankle, right     Plan: Findings:  In terms of her right ankle pain this is traumatic arthritis as diagnosed by Dr. Sharol Given.  I will make a referral to a podiatrist to see if they can treat her any differently and maybe it just may be more close care with her and she does want to see a podiatrist.  In terms of her knees I think this is patellofemoral arthritis which is not very high-grade on imaging.  We did give her samples of [place] and depending on relief or how  she is doing with that I would probably have her see Dr. Eunice Blase in our office for further evaluation and management of her knee pain.  Or she can return to Dr. Dorna Leitz for evaluation of her right total knee arthroplasty in her left knee.  We talked about this at length that she may give him a call.  In terms of her right hip and leg obviously when this becomes more severe I think repeating the injection is worthwhile.  The Cymbalta should help treat maybe all of her pain complaints at least to some degree.  It would be hard to add any other medications for nerve type pain given her dementia but something to look at.  She would not represent a good spinal cord stimulator trial.  She is probably not a good surgical candidate.    Meds & Orders: No orders of the defined types were placed in this encounter.   Orders Placed This Encounter  Procedures  . XR Knee 1-2 Views Left  . XR Knee 1-2 Views Right  . Ambulatory referral to Podiatry    Follow-up: Return if symptoms worsen or fail to improve.   Procedures: No procedures performed  No notes on file   Clinical History: MRI LUMBAR SPINE WITHOUT CONTRAST  TECHNIQUE: Multiplanar, multisequence MR imaging of the lumbar spine was performed. No intravenous contrast was administered.  COMPARISON:  03/28/2010 CT abdomen and pelvis.  FINDINGS: Segmentation:  Standard.  Alignment: Grade 1 L5-S1 anterolisthesis and chronic L5 pars defects are stable from the prior CT of the abdomen and pelvis. Otherwise normal lumbar lordosis.  Vertebrae:  No fracture, evidence of discitis, or bone lesion.  Conus medullaris: Extends to the L1-2 level and appears normal.  Paraspinal and other soft tissues: Negative.  Disc levels:  L1-2: Minimal disc bulge and mild facet hypertrophy. Trace facet effusions. No significant foraminal narrowing or canal stenosis.  L2-3: Small disc bulge eccentric to the left with mild facet hypertrophy.  Mild left-greater-than-right foraminal narrowing. No significant canal stenosis.  L3-4: Small disc bulge with mild facet and ligamentum flavum hypertrophy. Mild bilateral foraminal and lateral recess narrowing. No significant canal stenosis.  L4-5: Small disc bulge and central disc protrusion. Moderate facet hypertrophy. Mild bilateral recess narrowing. No significant foraminal narrowing or canal stenosis.  L5-S1: Anterolisthesis with small uncovered disc bulge and advanced facet hypertrophy greater on the right. Severe right and moderate left foraminal narrowing. No significant canal stenosis.  IMPRESSION: 1. No acute osseous abnormality. 2. Grade 1 L5-S1 anterolisthesis and chronic L5 pars defects stable from 2012 CT given differences in technique. 3. Lumbar degenerative changes are greatest at the L5-S1 level where anterolisthesis combined with disc and facet disease results in severe right and moderate left foraminal narrowing.   Electronically Signed   By: Edgardo Roys.D.  On: 08/27/2016 22:56   She reports that she has never smoked. She has never used smokeless tobacco. No results for input(s): HGBA1C, LABURIC in the last 8760 hours.  Objective:  VS:  HT:5\' 5"  (165.1 cm)   WT:140 lb (63.5 kg)  BMI:23.3    BP:126/74  HR:80bpm  TEMP: ( )  RESP:  Physical Exam Vitals signs and nursing note reviewed.  Constitutional:      General: She is not in acute distress.    Appearance: Normal appearance. She is well-developed.  HENT:     Head: Normocephalic and atraumatic.     Nose: Nose normal.     Mouth/Throat:     Mouth: Mucous membranes are moist.     Pharynx: Oropharynx is clear.  Eyes:     Conjunctiva/sclera: Conjunctivae normal.     Pupils: Pupils are equal, round, and reactive to light.  Neck:     Musculoskeletal: Normal range of motion and neck supple.  Cardiovascular:     Rate and Rhythm: Regular rhythm.  Pulmonary:     Effort: Pulmonary  effort is normal. No respiratory distress.  Abdominal:     General: There is no distension.     Palpations: Abdomen is soft.     Tenderness: There is no guarding.  Musculoskeletal:     Right lower leg: No edema.     Left lower leg: No edema.     Comments: Examination of both knees shows good varus and valgus stability with a negative Lockman's test bilaterally.  She has some crepitus on flexion extension bilaterally.  She has normal tracking of the patella.  She has no effusion.  She has some joint line tenderness and some tenderness over the peds anserine bursa.  In terms of her low back she has pain with extension of the low back on full extension and facet loading.  She has mild pain over the right greater trochanter but no pain with hip rotation.  She has good strength in the lower extremities.  Right ankle motion stiff and lack of motion compared to the left.  No swelling.  Skin:    General: Skin is warm and dry.     Findings: No erythema or rash.  Neurological:     General: No focal deficit present.     Mental Status: She is alert and oriented to person, place, and time.     Motor: No abnormal muscle tone.     Coordination: Coordination normal.     Gait: Gait normal.  Psychiatric:        Mood and Affect: Mood normal.        Behavior: Behavior normal.        Thought Content: Thought content normal.     Ortho Exam Imaging: No results found.  Past Medical/Family/Surgical/Social History: Medications & Allergies reviewed per EMR, new medications updated. Patient Active Problem List   Diagnosis Date Noted  . Hyponatremia 12/27/2017  . Traumatic arthritis of ankle, right 08/12/2017  . History of non-ST elevation myocardial infarction (NSTEMI) 12/17/2016  . Chronic anticoagulation 12/17/2016  . Chest pain with moderate risk for cardiac etiology 05/30/2015  . Internal and external hemorrhoids without complication 32/44/0102  . History of GI bleed 06/24/2014  . Chronic diastolic  heart failure ([place]) 06/24/2014  . Chronic atrial fibrillation 06/24/2014  . Pre-syncope 05/05/2014  . Osteoarthritis of right knee 08/10/2013  . OSA on CPAP 06/16/2013  . Bradycardia 10/25/2010  . Fatigue 10/25/2010  . Dyspnea 09/18/2010  . Coronary artery  disease   . Hypokalemia   . Incontinence of urine   . Stromal tumor of the stomach ([place])   . Dyslipidemia 05/06/2007  . Essential hypertension 05/06/2007  . GERD 05/06/2007  . IRRITABLE BOWEL SYNDROME 05/06/2007   Past Medical History:  Diagnosis Date  . Arthritis   . Atrial fibrillation ([place])   . Bladder polyps   . Cancer ([place])    stomach  . Cataracts, bilateral   . Cervical polyp   . Chronic kidney disease    urinary incont-  . Coronary artery disease    minimal  . Dysrhythmia     AF  hx     dr Acie Fredrickson  . GERD (gastroesophageal reflux disease)   . Hiatal hernia   . Hyperlipidemia   . Hypertension   . Hypokalemia   . IBS (irritable bowel syndrome)   . IFG (impaired fasting glucose)   . Incontinence of urine   . MI, acute, non ST segment elevation ([place]) TDS.2,8768   cath  . OSA (obstructive sleep apnea)    hypoxia , not CO 2 retainer,   . OSA on CPAP 06/16/2013    Sleep study 1- 11-15 with an AHI of 61 , now titrated to CPAP 9 cm water with 2 cm EPR.   . Osteopenia   . Stromal tumor of the stomach [facility])    cancer   Family History  Problem Relation Age of Onset  . Pancreatic cancer Paternal Grandmother   . Heart disease Paternal Grandfather        both sides of the family  . Diabetes Paternal Aunt    Past Surgical History:  Procedure Laterality Date  . ANKLE FRACTURE SURGERY  2006   right, plate   . CARDIAC CATHETERIZATION  12/07/20210   Dr.Nahser,minor cad  . COLONOSCOPY    . COLONOSCOPY N/A 16-May-202016   Procedure: COLONOSCOPY;  Surgeon: Inda Castle, MD;  Location: [place];  Service: Endoscopy;  Laterality: N/A;  . ENTEROSCOPY N/A 16-May-202016   Procedure: ENTEROSCOPY;  Surgeon: Inda Castle,  MD;  Location: [facility] ENDOSCOPY;  Service: Endoscopy;  Laterality: N/A;  . EYE SURGERY     both cataracts  . KNEE ARTHROSCOPY Right 05/28/2012   Procedure: RIGHT KNEE ARTHROSCOPY PARTIAL MEDIAL AND LATERAL MENISECTOMY WITH CHONDROPLASTY;  Surgeon: Alta Corning, MD;  Location: [place];  Service: Orthopedics;  Laterality: Right;  . stromal stomach  tumor  03/26/2007   resection  . TOTAL KNEE ARTHROPLASTY Right 08/10/2013   Procedure: TOTAL KNEE ARTHROPLASTY;  Surgeon: Alta Corning, MD;  Location: [place];  Service: Orthopedics;  Laterality: Right;  . UPPER GI ENDOSCOPY     Social History   Occupational History  . Occupation: retired    Fish farm manager: RETIRED  Tobacco Use  . Smoking status: Never Smoker  . Smokeless tobacco: Never Used  Substance and Sexual Activity  . Alcohol use: Yes    Alcohol/week: 2.0 standard drinks    Types: 2 Glasses of wine per week    Comment: one per week  . Drug use: No  . Sexual activity: Never

## 2018-02-21 ENCOUNTER — Encounter (HOSPITAL_COMMUNITY): Payer: Self-pay

## 2018-02-21 ENCOUNTER — Emergency Department (HOSPITAL_COMMUNITY): Payer: Medicare Other

## 2018-02-21 ENCOUNTER — Observation Stay (HOSPITAL_COMMUNITY)
Admission: EM | Admit: 2018-02-21 | Discharge: 2018-02-28 | Disposition: A | Discharge status: Skilled Nursing Facility | Payer: Medicare Other | Attending: Internal Medicine | Admitting: Internal Medicine

## 2018-02-21 DIAGNOSIS — F0390 Unspecified dementia without behavioral disturbance: Secondary | ICD-10-CM | POA: Insufficient documentation

## 2018-02-21 DIAGNOSIS — Z96651 Presence of right artificial knee joint: Secondary | ICD-10-CM | POA: Insufficient documentation

## 2018-02-21 DIAGNOSIS — M19041 Primary osteoarthritis, right hand: Secondary | ICD-10-CM | POA: Insufficient documentation

## 2018-02-21 DIAGNOSIS — R296 Repeated falls: Secondary | ICD-10-CM | POA: Diagnosis present

## 2018-02-21 DIAGNOSIS — N189 Chronic kidney disease, unspecified: Secondary | ICD-10-CM | POA: Insufficient documentation

## 2018-02-21 DIAGNOSIS — I252 Old myocardial infarction: Secondary | ICD-10-CM | POA: Insufficient documentation

## 2018-02-21 DIAGNOSIS — E876 Hypokalemia: Secondary | ICD-10-CM | POA: Diagnosis present

## 2018-02-21 DIAGNOSIS — W06XXXA Fall from bed, initial encounter: Secondary | ICD-10-CM | POA: Diagnosis not present

## 2018-02-21 DIAGNOSIS — R2681 Unsteadiness on feet: Secondary | ICD-10-CM | POA: Insufficient documentation

## 2018-02-21 DIAGNOSIS — M8580 Other specified disorders of bone density and structure, unspecified site: Secondary | ICD-10-CM | POA: Diagnosis not present

## 2018-02-21 DIAGNOSIS — R55 Syncope and collapse: Principal | ICD-10-CM | POA: Diagnosis present

## 2018-02-21 DIAGNOSIS — Z79899 Other long term (current) drug therapy: Secondary | ICD-10-CM | POA: Insufficient documentation

## 2018-02-21 DIAGNOSIS — I482 Chronic atrial fibrillation, unspecified: Secondary | ICD-10-CM | POA: Diagnosis present

## 2018-02-21 DIAGNOSIS — K219 Gastro-esophageal reflux disease without esophagitis: Secondary | ICD-10-CM | POA: Insufficient documentation

## 2018-02-21 DIAGNOSIS — I5032 Chronic diastolic (congestive) heart failure: Secondary | ICD-10-CM | POA: Diagnosis not present

## 2018-02-21 DIAGNOSIS — S52614A Nondisplaced fracture of right ulna styloid process, initial encounter for closed fracture: Secondary | ICD-10-CM | POA: Insufficient documentation

## 2018-02-21 DIAGNOSIS — E785 Hyperlipidemia, unspecified: Secondary | ICD-10-CM | POA: Diagnosis not present

## 2018-02-21 DIAGNOSIS — G4733 Obstructive sleep apnea (adult) (pediatric): Secondary | ICD-10-CM

## 2018-02-21 DIAGNOSIS — I444 Left anterior fascicular block: Secondary | ICD-10-CM | POA: Diagnosis not present

## 2018-02-21 DIAGNOSIS — I071 Rheumatic tricuspid insufficiency: Secondary | ICD-10-CM | POA: Insufficient documentation

## 2018-02-21 DIAGNOSIS — K589 Irritable bowel syndrome without diarrhea: Secondary | ICD-10-CM | POA: Insufficient documentation

## 2018-02-21 DIAGNOSIS — E860 Dehydration: Secondary | ICD-10-CM | POA: Insufficient documentation

## 2018-02-21 DIAGNOSIS — R4189 Other symptoms and signs involving cognitive functions and awareness: Secondary | ICD-10-CM | POA: Insufficient documentation

## 2018-02-21 DIAGNOSIS — I6782 Cerebral ischemia: Secondary | ICD-10-CM | POA: Insufficient documentation

## 2018-02-21 DIAGNOSIS — W19XXXA Unspecified fall, initial encounter: Secondary | ICD-10-CM

## 2018-02-21 DIAGNOSIS — I10 Essential (primary) hypertension: Secondary | ICD-10-CM | POA: Diagnosis present

## 2018-02-21 DIAGNOSIS — R9431 Abnormal electrocardiogram [ECG] [EKG]: Secondary | ICD-10-CM | POA: Diagnosis not present

## 2018-02-21 DIAGNOSIS — S52501A Unspecified fracture of the lower end of right radius, initial encounter for closed fracture: Secondary | ICD-10-CM | POA: Diagnosis present

## 2018-02-21 DIAGNOSIS — R000 Tachycardia, unspecified: Secondary | ICD-10-CM | POA: Diagnosis not present

## 2018-02-21 DIAGNOSIS — Z9181 History of falling: Secondary | ICD-10-CM | POA: Insufficient documentation

## 2018-02-21 DIAGNOSIS — Z8249 Family history of ischemic heart disease and other diseases of the circulatory system: Secondary | ICD-10-CM | POA: Insufficient documentation

## 2018-02-21 DIAGNOSIS — I4821 Permanent atrial fibrillation: Secondary | ICD-10-CM | POA: Diagnosis not present

## 2018-02-21 DIAGNOSIS — I2510 Atherosclerotic heart disease of native coronary artery without angina pectoris: Secondary | ICD-10-CM | POA: Diagnosis not present

## 2018-02-21 DIAGNOSIS — I130 Hypertensive heart and chronic kidney disease with heart failure and stage 1 through stage 4 chronic kidney disease, or unspecified chronic kidney disease: Secondary | ICD-10-CM | POA: Insufficient documentation

## 2018-02-21 DIAGNOSIS — Z888 Allergy status to other drugs, medicaments and biological substances status: Secondary | ICD-10-CM | POA: Insufficient documentation

## 2018-02-21 DIAGNOSIS — Z7901 Long term (current) use of anticoagulants: Secondary | ICD-10-CM

## 2018-02-21 DIAGNOSIS — M6281 Muscle weakness (generalized): Secondary | ICD-10-CM | POA: Insufficient documentation

## 2018-02-21 DIAGNOSIS — S52591A Other fractures of lower end of right radius, initial encounter for closed fracture: Secondary | ICD-10-CM | POA: Insufficient documentation

## 2018-02-21 DIAGNOSIS — I700 Atherosclerosis of aorta: Secondary | ICD-10-CM | POA: Insufficient documentation

## 2018-02-21 DIAGNOSIS — Z9989 Dependence on other enabling machines and devices: Secondary | ICD-10-CM

## 2018-02-21 LAB — COMPREHENSIVE METABOLIC PANEL
ALK PHOS: 47 U/L (ref 38–126)
ALT: 12 U/L (ref 0–44)
AST: 25 U/L (ref 15–41)
Albumin: 3.9 g/dL (ref 3.5–5.0)
Anion gap: 12 (ref 5–15)
BUN: 11 mg/dL (ref 8–23)
CALCIUM: 9.5 mg/dL (ref 8.9–10.3)
CO2: 25 mmol/L (ref 22–32)
CREATININE: 0.55 mg/dL (ref 0.44–1.00)
Chloride: 101 mmol/L (ref 98–111)
GFR calc Af Amer: >60 mL/min (ref >60)
Glucose, Bld: 107 mg/dL — ABNORMAL HIGH (ref 70–99)
Potassium: 2.7 mmol/L — CL (ref 3.5–5.1)
Sodium: 138 mmol/L (ref 135–145)
Total Bilirubin: 2 mg/dL — ABNORMAL HIGH (ref 0.3–1.2)
Total Protein: 6.6 g/dL (ref 6.5–8.1)

## 2018-02-21 LAB — URINALYSIS, ROUTINE W REFLEX MICROSCOPIC
Bilirubin Urine: NEGATIVE
GLUCOSE, UA: NEGATIVE mg/dL
KETONES UR: 20 mg/dL — AB
Leukocytes, UA: NEGATIVE
Nitrite: NEGATIVE
PH: 8 (ref 5.0–8.0)
Protein, ur: NEGATIVE mg/dL
Specific Gravity, Urine: 1.012 (ref 1.005–1.030)

## 2018-02-21 LAB — CBC WITH DIFFERENTIAL/PLATELET
ABS IMMATURE GRANULOCYTES: 0.04 10*3/uL (ref 0.00–0.07)
BASOS ABS: 0 10*3/uL (ref 0.0–0.1)
Basophils Relative: 0 %
Eosinophils Absolute: 0 10*3/uL (ref 0.0–0.5)
Eosinophils Relative: 0 %
HCT: 40 % (ref 36.0–46.0)
HEMOGLOBIN: 12.2 g/dL (ref 12.0–15.0)
Immature Granulocytes: 1 %
LYMPHS PCT: 13 %
Lymphs Abs: 0.9 10*3/uL (ref 0.7–4.0)
MCH: 29 pg (ref 26.0–34.0)
MCHC: 30.5 g/dL (ref 30.0–36.0)
MCV: 95.2 fL (ref 80.0–100.0)
MONO ABS: 1 10*3/uL (ref 0.1–1.0)
Monocytes Relative: 13 %
NEUTROS ABS: 5.4 10*3/uL (ref 1.7–7.7)
Neutrophils Relative %: 73 %
Platelets: 129 10*3/uL — ABNORMAL LOW (ref 150–400)
RBC: 4.2 MIL/uL (ref 3.87–5.11)
RDW: 12.5 % (ref 11.5–15.5)
WBC: 7.3 10*3/uL (ref 4.0–10.5)
nRBC: 0 % (ref 0.0–0.2)

## 2018-02-21 LAB — PROTIME-INR
INR: 1.09
Prothrombin Time: 14 seconds (ref 11.4–15.2)

## 2018-02-21 LAB — MAGNESIUM: Magnesium: 1.7 mg/dL (ref 1.7–2.4)

## 2018-02-21 LAB — TROPONIN I: Troponin I: <0.03 ng/mL (ref <0.03)

## 2018-02-21 MED ORDER — SODIUM CHLORIDE 0.9% FLUSH
3.0000 mL | INTRAVENOUS | Status: DC | PRN
  Administered 2018-02-23: 3 mL via INTRAVENOUS
  Filled 2018-02-21: qty 3

## 2018-02-21 MED ORDER — GELATIN ABSORBABLE 12-7 MM EX MISC
CUTANEOUS | Status: AC
  Filled 2018-02-21: qty 1

## 2018-02-21 MED ORDER — DULOXETINE HCL 20 MG PO CPEP
40.0000 mg | ORAL_CAPSULE | Freq: Every day | ORAL | Status: DC
  Administered 2018-02-21 – 2018-02-27 (×6): 40 mg via ORAL
  Filled 2018-02-21 (×8): qty 2

## 2018-02-21 MED ORDER — NITROGLYCERIN 0.4 MG SL SUBL: 0.4000 mg | SUBLINGUAL_TABLET | SUBLINGUAL | Status: DC | PRN

## 2018-02-21 MED ORDER — ENSURE ENLIVE PO LIQD
237.0000 mL | Freq: Two times a day (BID) | ORAL | Status: DC
  Administered 2018-02-22 – 2018-02-27 (×8): 237 mL via ORAL

## 2018-02-21 MED ORDER — ACETAMINOPHEN 325 MG PO TABS
650.0000 mg | ORAL_TABLET | Freq: Four times a day (QID) | ORAL | Status: DC | PRN
  Administered 2018-02-25 (×2): 650 mg via ORAL
  Filled 2018-02-21 (×2): qty 2

## 2018-02-21 MED ORDER — ACETAMINOPHEN 650 MG RE SUPP: 650.0000 mg | Freq: Four times a day (QID) | RECTAL | Status: DC | PRN

## 2018-02-21 MED ORDER — AMLODIPINE BESYLATE 2.5 MG PO TABS
5.0000 mg | ORAL_TABLET | Freq: Every day | ORAL | Status: DC
  Administered 2018-02-22 – 2018-02-23 (×2): 5 mg via ORAL
  Filled 2018-02-21 (×2): qty 2

## 2018-02-21 MED ORDER — SODIUM CHLORIDE 0.9% FLUSH
3.0000 mL | Freq: Two times a day (BID) | INTRAVENOUS | Status: DC
  Administered 2018-02-21 – 2018-02-28 (×12): 3 mL via INTRAVENOUS

## 2018-02-21 MED ORDER — POTASSIUM CHLORIDE CRYS ER 20 MEQ PO TBCR: EXTENDED_RELEASE_TABLET | ORAL | 0 refills | Status: AC

## 2018-02-21 MED ORDER — ATORVASTATIN CALCIUM 10 MG PO TABS
20.0000 mg | ORAL_TABLET | Freq: Every day | ORAL | Status: DC
  Administered 2018-02-22 – 2018-02-28 (×7): 20 mg via ORAL
  Filled 2018-02-21 (×7): qty 2

## 2018-02-21 MED ORDER — HYDRALAZINE HCL 20 MG/ML IJ SOLN: 10.0000 mg | Freq: Four times a day (QID) | INTRAMUSCULAR | Status: DC | PRN

## 2018-02-21 MED ORDER — LIDOCAINE HCL (PF) 1 % IJ SOLN
20.0000 mL | Freq: Once | INTRAMUSCULAR | Status: AC
  Administered 2018-02-21: 20 mL
  Filled 2018-02-21: qty 20

## 2018-02-21 MED ORDER — ONDANSETRON HCL 4 MG/2ML IJ SOLN: 4.0000 mg | Freq: Four times a day (QID) | INTRAMUSCULAR | Status: DC | PRN

## 2018-02-21 MED ORDER — ONDANSETRON 4 MG PO TBDP: 4.0000 mg | ORAL_TABLET | Freq: Three times a day (TID) | ORAL | Status: DC | PRN

## 2018-02-21 MED ORDER — GALANTAMINE HYDROBROMIDE ER 8 MG PO CP24: 16.0000 mg | ORAL_CAPSULE | Freq: Every day | ORAL | Status: DC

## 2018-02-21 MED ORDER — POTASSIUM CHLORIDE 10 MEQ/100ML IV SOLN: 10.0000 meq | INTRAVENOUS | Status: DC

## 2018-02-21 MED ORDER — POLYETHYLENE GLYCOL 3350 17 G PO PACK: 17.0000 g | PACK | Freq: Every day | ORAL | Status: DC | PRN

## 2018-02-21 MED ORDER — POTASSIUM CHLORIDE CRYS ER 20 MEQ PO TBCR
40.0000 meq | EXTENDED_RELEASE_TABLET | Freq: Once | ORAL | Status: AC
  Administered 2018-02-21: 40 meq via ORAL

## 2018-02-21 MED ORDER — ESOMEPRAZOLE MAGNESIUM 40 MG PO PACK: 40.0000 mg | PACK | Freq: Every day | ORAL | Status: DC

## 2018-02-21 MED ORDER — TRAZODONE HCL 50 MG PO TABS
50.0000 mg | ORAL_TABLET | Freq: Every evening | ORAL | Status: DC | PRN
  Administered 2018-02-23: 50 mg via ORAL
  Filled 2018-02-21: qty 1

## 2018-02-21 MED ORDER — BUPIVACAINE HCL (PF) 0.5 % IJ SOLN
INTRAMUSCULAR | Status: AC
  Filled 2018-02-21: qty 30

## 2018-02-21 MED ORDER — TOBRAMYCIN SULFATE 1.2 G IJ SOLR
INTRAMUSCULAR | Status: AC
  Filled 2018-02-21: qty 1.2

## 2018-02-21 MED ORDER — SODIUM CHLORIDE 0.9 % IV SOLN
250.0000 mL | INTRAVENOUS | Status: DC | PRN
  Administered 2018-02-21: 250 mL via INTRAVENOUS

## 2018-02-21 MED ORDER — HYDROCODONE-ACETAMINOPHEN 5-325 MG PO TABS
1.0000 | ORAL_TABLET | ORAL | Status: DC | PRN
  Administered 2018-02-26: 1 via ORAL
  Filled 2018-02-21: qty 1

## 2018-02-21 MED ORDER — BISMUTH SUBSALICYLATE 262 MG/15ML PO SUSP
30.0000 mL | Freq: Four times a day (QID) | ORAL | Status: DC | PRN
  Filled 2018-02-21: qty 236

## 2018-02-21 MED ORDER — CALCIUM CARBONATE ANTACID 500 MG PO CHEW: 1.0000 | CHEWABLE_TABLET | Freq: Three times a day (TID) | ORAL | Status: DC | PRN

## 2018-02-21 MED ORDER — IOPAMIDOL (ISOVUE-300) INJECTION 61%
INTRAVENOUS | Status: AC
  Filled 2018-02-21: qty 50

## 2018-02-21 MED ORDER — APIXABAN 2.5 MG PO TABS
2.5000 mg | ORAL_TABLET | Freq: Two times a day (BID) | ORAL | Status: DC
  Administered 2018-02-21 – 2018-02-28 (×14): 2.5 mg via ORAL
  Filled 2018-02-21 (×14): qty 1

## 2018-02-21 MED ORDER — PANTOPRAZOLE SODIUM 40 MG PO TBEC
40.0000 mg | DELAYED_RELEASE_TABLET | Freq: Every day | ORAL | Status: DC
  Administered 2018-02-21 – 2018-02-28 (×8): 40 mg via ORAL
  Filled 2018-02-21 (×8): qty 1

## 2018-02-21 MED ORDER — POTASSIUM CHLORIDE CRYS ER 20 MEQ PO TBCR
40.0000 meq | EXTENDED_RELEASE_TABLET | Freq: Once | ORAL | Status: DC
  Filled 2018-02-21: qty 2

## 2018-02-21 MED ORDER — ALBUTEROL SULFATE (2.5 MG/3ML) 0.083% IN NEBU: 2.5000 mg | INHALATION_SOLUTION | RESPIRATORY_TRACT | Status: DC | PRN

## 2018-02-21 MED ORDER — ONDANSETRON HCL 4 MG PO TABS: 4.0000 mg | ORAL_TABLET | Freq: Four times a day (QID) | ORAL | Status: DC | PRN

## 2018-02-21 MED ORDER — METOPROLOL SUCCINATE ER 100 MG PO TB24
100.0000 mg | ORAL_TABLET | Freq: Every day | ORAL | Status: DC
  Administered 2018-02-22 – 2018-02-28 (×7): 100 mg via ORAL
  Filled 2018-02-21 (×7): qty 1

## 2018-02-21 MED ORDER — LIDOCAINE HCL 1 % IJ SOLN: 20.0000 mL | Freq: Once | INTRAMUSCULAR | Status: DC

## 2018-02-21 MED ORDER — MAGNESIUM SULFATE IN D5W 1-5 GM/100ML-% IV SOLN
1.0000 g | Freq: Once | INTRAVENOUS | Status: AC
  Administered 2018-02-21: 1 g via INTRAVENOUS
  Filled 2018-02-21 (×2): qty 100

## 2018-02-21 MED ORDER — GALANTAMINE HYDROBROMIDE ER 8 MG PO CP24
16.0000 mg | ORAL_CAPSULE | Freq: Every day | ORAL | Status: DC
  Administered 2018-02-22 – 2018-02-23 (×2): 16 mg via ORAL
  Filled 2018-02-21 (×3): qty 2

## 2018-02-21 NOTE — Consult Note (Signed)
Reason for Consult: Comminuted displaced  right distal radius fracture Referring Physician: ER staff  [name] is an 83 y.o. female.  HPI: 83 year old female who sustained injury to her right distal radius.  She has been seen and examined extensively by myself.  She notes no elbow shoulder or upper arm pain on the right side.  X-rays reveal a displaced fracture about the distal radius.  I reviewed this with her at length and the findings.  I have also reviewed her chart extensively.  I would recommend a closed reduction and consideration for closed treatment if she desires and based upon her medical history which is fairly complex.  She understands this and verbally agrees to a closed reduction.  Past Medical History:  Diagnosis Date  . Arthritis   . Atrial fibrillation ([place])   . Bladder polyps   . Cancer ([place])    stomach  . Cataracts, bilateral   . Cervical polyp   . Chronic kidney disease    urinary incont-  . Coronary artery disease    minimal  . Dysrhythmia     AF  hx     dr Acie Fredrickson  . GERD (gastroesophageal reflux disease)   . Hiatal hernia   . Hyperlipidemia   . Hypertension   . Hypokalemia   . IBS (irritable bowel syndrome)   . IFG (impaired fasting glucose)   . Incontinence of urine   . MI, acute, non ST segment elevation ([place]) OTL.5,7262   cath  . OSA (obstructive sleep apnea)    hypoxia , not CO 2 retainer,   . OSA on CPAP 06/16/2013    Sleep study 1- 11-15 with an AHI of 61 , now titrated to CPAP 9 cm water with 2 cm EPR.   . Osteopenia   . Stromal tumor of the stomach [facility])    cancer    Past Surgical History:  Procedure Laterality Date  . ANKLE FRACTURE SURGERY  2006   right, plate   . CARDIAC CATHETERIZATION  12/07/20210   Dr.Nahser,minor cad  . COLONOSCOPY    . COLONOSCOPY N/A 02-17-202016   Procedure: COLONOSCOPY;  Surgeon: Inda Castle, MD;  Location: [place];  Service: Endoscopy;  Laterality: N/A;  . ENTEROSCOPY N/A 02-17-202016    Procedure: ENTEROSCOPY;  Surgeon: Inda Castle, MD;  Location: [facility];  Service: Endoscopy;  Laterality: N/A;  . EYE SURGERY     both cataracts  . KNEE ARTHROSCOPY Right 05/28/2012   Procedure: RIGHT KNEE ARTHROSCOPY PARTIAL MEDIAL AND LATERAL MENISECTOMY WITH CHONDROPLASTY;  Surgeon: Alta Corning, MD;  Location: [place];  Service: Orthopedics;  Laterality: Right;  . stromal stomach  tumor  03/26/2007   resection  . TOTAL KNEE ARTHROPLASTY Right 08/10/2013   Procedure: TOTAL KNEE ARTHROPLASTY;  Surgeon: Alta Corning, MD;  Location: [place];  Service: Orthopedics;  Laterality: Right;  . UPPER GI ENDOSCOPY      Family History  Problem Relation Age of Onset  . Pancreatic cancer Paternal Grandmother   . Heart disease Paternal Grandfather        both sides of the family  . Diabetes Paternal Aunt     Social History:  reports that she has never smoked. She has never used smokeless tobacco. She reports current alcohol use of about 2.0 standard drinks of alcohol per week. She reports that she does not use drugs.  Allergies:  Allergies  Allergen Reactions  . Detrol [Tolterodine] Other (See Comments)  Critical reaction-per MD office  . Tranexamic Acid Other (See Comments)    Patient ineligible for Tranexamic acid due to hx of NSTEMI and CAD.  [name] Xarelto [Rivaroxaban] Other (See Comments)    Critical reaction  . Pradaxa [Dabigatran Etexilate Mesylate] Nausea And Vomiting and Other (See Comments)    Moderate reaction-per MD    Medications: I have reviewed the patient's current medications.  Results for orders placed or performed during the hospital encounter of 02/21/18 (from the past 48 hour(s))  Comprehensive metabolic panel     Status: Abnormal   Collection Time: 02/21/18  1:41 PM  Result Value Ref Range   Sodium 138 135 - 145 mmol/L   Potassium 2.7 (LL) 3.5 - 5.1 mmol/L    Comment: CRITICAL RESULT CALLED TO, READ BACK BY AND VERIFIED WITH: B.MIKELSON,RN [id number]  02/21/18 CLARK,S    Chloride 101 98 - 111 mmol/L   CO2 25 22 - 32 mmol/L   Glucose, Bld 107 (H) 70 - 99 mg/dL   BUN 11 8 - 23 mg/dL   Creatinine, Ser 0.55 0.44 - 1.00 mg/dL   Calcium 9.5 8.9 - 10.3 mg/dL   Total Protein 6.6 6.5 - 8.1 g/dL   Albumin 3.9 3.5 - 5.0 g/dL   AST 25 15 - 41 U/L   ALT 12 0 - 44 U/L   Alkaline Phosphatase 47 38 - 126 U/L   Total Bilirubin 2.0 (H) 0.3 - 1.2 mg/dL   GFR calc non Af Amer >60 >60 mL/min   GFR calc Af Amer >60 >60 mL/min   Anion gap 12 5 - 15    Comment: Performed at [facility] [address], [place], [place] [address]  Protime-INR     Status: None   Collection Time: 02/21/18  1:41 PM  Result Value Ref Range   Prothrombin Time 14.0 11.4 - 15.2 seconds   INR 1.09     Comment: Performed at [facility] [address], [place], [place] [address]  Urinalysis, Routine w reflex microscopic     Status: Abnormal   Collection Time: 02/21/18  2:00 PM  Result Value Ref Range   Color, Urine YELLOW YELLOW   APPearance CLOUDY (A) CLEAR   Specific Gravity, Urine 1.012 1.005 - 1.030   pH 8.0 5.0 - 8.0   Glucose, UA NEGATIVE NEGATIVE mg/dL   Hgb urine dipstick SMALL (A) NEGATIVE   Bilirubin Urine NEGATIVE NEGATIVE   Ketones, ur 20 (A) NEGATIVE mg/dL   Protein, ur NEGATIVE NEGATIVE mg/dL   Nitrite NEGATIVE NEGATIVE   Leukocytes, UA NEGATIVE NEGATIVE   RBC / HPF 0-5 0 - 5 RBC/hpf   Bacteria, UA RARE (A) NONE SEEN    Comment: Performed at [facility] [address], [place], [place] [address]  CBC with Differential     Status: Abnormal   Collection Time: 02/21/18  2:54 PM  Result Value Ref Range   WBC 7.3 4.0 - 10.5 K/uL   RBC 4.20 3.87 - 5.11 MIL/uL   Hemoglobin 12.2 12.0 - 15.0 g/dL   HCT 40.0 36.0 - 46.0 %   MCV 95.2 80.0 - 100.0 fL   MCH 29.0 26.0 - 34.0 pg   MCHC 30.5 30.0 - 36.0 g/dL   RDW 12.5 11.5 - 15.5 %   Platelets 129 (L) 150 - 400 K/uL    Comment: REPEATED TO VERIFY   nRBC 0.0 0.0 - 0.2 %   Neutrophils  Relative % 73 %  Neutro Abs 5.4 1.7 - 7.7 K/uL   Lymphocytes Relative 13 %   Lymphs Abs 0.9 0.7 - 4.0 K/uL   Monocytes Relative 13 %   Monocytes Absolute 1.0 0.1 - 1.0 K/uL   Eosinophils Relative 0 %   Eosinophils Absolute 0.0 0.0 - 0.5 K/uL   Basophils Relative 0 %   Basophils Absolute 0.0 0.0 - 0.1 K/uL   Immature Granulocytes 1 %   Abs Immature Granulocytes 0.04 0.00 - 0.07 K/uL    Comment: Performed at [facility] [address] [place], [place] [address]    Dg Chest 2 View  Result Date: 02/21/2018 CLINICAL DATA:  Initial evaluation for acute trauma, fall. EXAM: CHEST - 2 VIEW COMPARISON:  Prior radiograph from 04/27/2016. FINDINGS: Moderate cardiomegaly. Mediastinal silhouette normal. Aortic atherosclerosis. Lungs normally inflated. Mild diffuse pulmonary vascular and interstitial congestion without overt pulmonary edema. No focal infiltrates. No pleural effusion. No pneumothorax. No acute osseous abnormality. Diffuse osteopenia. IMPRESSION: 1. Cardiomegaly with mild diffuse pulmonary vascular and interstitial congestion without overt pulmonary edema. 2. Aortic atherosclerosis. Electronically Signed   By: Jeannine Boga M.D.   On: 02/21/2018 13:20   Dg Forearm Right  Result Date: 02/21/2018 CLINICAL DATA:  Initial evaluation for acute trauma, fall. EXAM: RIGHT FOREARM - 2 VIEW COMPARISON:  None. FINDINGS: Acute transverse fracture through the distal right radius with impaction and slight dorsal angulation. Additional fracture through the base of the ulnar styloid better seen on concomitant radiograph of the hand. No other acute fracture or dislocation about the forearm. Soft tissue swelling overlies the wrist. Mild degenerative changes noted about the elbow. Osteopenia. IMPRESSION: 1. Acute transverse fracture through the distal right radius with impaction and slight dorsal angulation. 2. Acute nondisplaced fracture through the base of the ulnar styloid. 3. No other acute  traumatic injury about the forearm. Electronically Signed   By: Jeannine Boga M.D.   On: 02/21/2018 13:22   Ct Head Wo Contrast  Result Date: 02/21/2018 CLINICAL DATA:  [place] out of bed. On Xarelto. History of dementia. Initial encounter. EXAM: CT HEAD WITHOUT CONTRAST CT CERVICAL SPINE WITHOUT CONTRAST TECHNIQUE: Multidetector CT imaging of the head and cervical spine was performed following the standard protocol without intravenous contrast. Multiplanar CT image reconstructions of the cervical spine were also generated. COMPARISON:  Brain MRI 09/13/2016 and cervical spine CT 08/31/2007 FINDINGS: CT HEAD FINDINGS Brain: There is no evidence of acute infarct, intracranial hemorrhage, mass, midline shift, or extra-axial fluid collection. Mild cerebral atrophy is within normal limits for age. Patchy cerebral white matter hypodensities are nonspecific but compatible with moderate chronic small vessel ischemic disease. Vascular: Calcified atherosclerosis at the skull base. No hyperdense vessel. Skull: No fracture or focal osseous lesion. Sinuses/Orbits: Minimal left sphenoid sinus mucosal thickening. Clear mastoid air cells. Bilateral cataract extraction. Other: None. CT CERVICAL SPINE FINDINGS Alignment: Unchanged trace anterolisthesis of C3 on C4 and C4 on C5. Skull base and vertebrae: No acute fracture or suspicious osseous lesion. Soft tissues and spinal canal: No prevertebral fluid or swelling. No visible canal hematoma. Disc levels: Similar appearance of mild cervical spondylosis. Mild-to-moderate multilevel facet arthrosis has progressed. Mild left neural foraminal stenosis at C5-6. No osseous spinal canal stenosis. Upper chest: Clear lung apices. Other: Mild calcific atherosclerosis involving the carotid arteries. IMPRESSION: 1. No evidence of acute intracranial abnormality. 2. Moderate chronic small vessel ischemic disease. 3. No evidence of acute fracture or traumatic subluxation in the cervical  spine. Electronically Signed   By: Zenia Resides  Jeralyn Ruths M.D.   On: 02/21/2018 13:08   Ct Cervical Spine Wo Contrast  Result Date: 02/21/2018 CLINICAL DATA:  [place] out of bed. On Xarelto. History of dementia. Initial encounter. EXAM: CT HEAD WITHOUT CONTRAST CT CERVICAL SPINE WITHOUT CONTRAST TECHNIQUE: Multidetector CT imaging of the head and cervical spine was performed following the standard protocol without intravenous contrast. Multiplanar CT image reconstructions of the cervical spine were also generated. COMPARISON:  Brain MRI 09/13/2016 and cervical spine CT 08/31/2007 FINDINGS: CT HEAD FINDINGS Brain: There is no evidence of acute infarct, intracranial hemorrhage, mass, midline shift, or extra-axial fluid collection. Mild cerebral atrophy is within normal limits for age. Patchy cerebral white matter hypodensities are nonspecific but compatible with moderate chronic small vessel ischemic disease. Vascular: Calcified atherosclerosis at the skull base. No hyperdense vessel. Skull: No fracture or focal osseous lesion. Sinuses/Orbits: Minimal left sphenoid sinus mucosal thickening. Clear mastoid air cells. Bilateral cataract extraction. Other: None. CT CERVICAL SPINE FINDINGS Alignment: Unchanged trace anterolisthesis of C3 on C4 and C4 on C5. Skull base and vertebrae: No acute fracture or suspicious osseous lesion. Soft tissues and spinal canal: No prevertebral fluid or swelling. No visible canal hematoma. Disc levels: Similar appearance of mild cervical spondylosis. Mild-to-moderate multilevel facet arthrosis has progressed. Mild left neural foraminal stenosis at C5-6. No osseous spinal canal stenosis. Upper chest: Clear lung apices. Other: Mild calcific atherosclerosis involving the carotid arteries. IMPRESSION: 1. No evidence of acute intracranial abnormality. 2. Moderate chronic small vessel ischemic disease. 3. No evidence of acute fracture or traumatic subluxation in the cervical spine. Electronically Signed    By: Logan Bores M.D.   On: 02/21/2018 13:08   Dg Hand Complete Right  Result Date: 02/21/2018 CLINICAL DATA:  Initial evaluation for acute trauma, fall. EXAM: RIGHT HAND - COMPLETE 3+ VIEW COMPARISON:  None. FINDINGS: Acute transverse fracture extending through the distal right radius with dorsal angulation, better evaluated on concomitant radiograph of the right wrist. Associated fracture through the base of the ulnar styloid noted as well. No other acute fracture or dislocation about the hand. Moderate to advanced osteoarthritic changes throughout the hand, most notable at the first [facility] joint and thumb. Diffuse osteopenia. Soft tissue swelling overlies the wrist. IMPRESSION: 1. Acute fractures of the distal right radius and ulnar styloid, better evaluated on concomitant radiograph of the right wrist. 2. No other acute fracture or dislocation about the hand. 3. Moderate to advanced degenerative osteoarthrosis throughout the hand, most notable at the first [facility] joint and thumb. Electronically Signed   By: Jeannine Boga M.D.   On: 02/21/2018 13:18    Review of Systems  Respiratory: Negative.   Cardiovascular: Negative.   Genitourinary: Negative.    Blood pressure (!) 149/76, pulse 89, temperature 98 F (36.7 C), temperature source Oral, resp. rate 16, SpO2 100 %. Physical Exam Distal radius fracture comminuted complex and articular right upper extremity.  She has swelling hematoma and displacement which is very apparent.  Lower extremity examination is stable.  She is conversant today her chest has equal expansion abdomen is nontender and she denies left upper extremity complaints. Assessment/Plan: Distal radius fracture right upper extremity comminuted in nature.  She underwent a closed reduction.  She was prepped with alcohol and Betadine and given a hematoma block.  Following this I cleansed the arm and placed Neosporin as well as Adaptic and Xeroform as her skin is quite thin.  We  then performed a gentle closed reduction with fingertrap traction.  X-ray/fluoroscopy was brought  in and 5 view x-ray series was performed examined and interpreted by myself and looked to be excellent.  I feel she will likely have some degree of collapse.  Nevertheless we will plan for an initial attempt at closed treatment.  Certainly in a perfect situation it would be nice to simply stabilize the fracture however given her age activity level and dementia issues we are going to see how she does with a closed treatment algorithm at this juncture.  I would like to see her back in my office in 1-1/2 weeks.  She knows to call me should any problems occur.  At the conclusion of the procedure she was intact to sensation and vascular exam without signs of compartment syndrome  Lizette Pazos MD  Willa Frater III 02/21/2018, 5:46 PM

## 2018-02-21 NOTE — Progress Notes (Signed)
CSW spoke to Miss [name] the pt's contact at ph: [phone] who was a former Therapist, nutritional at [facility] until retirement who is supportive and closely involved with the pt.  Per Miss [name] the pt's only family is a brother-in-law who lives in [place] and who was also hospitalized today due to a back problem.  7:54 PM CSW sees pt has been admitted for overnight observation.  CSW informed [facility] is aware.  2nd shift ED CSW will leave handoff for 1st shift ED CSW.  Alphonse Guild. Alyssabeth Bruster, LCSW, LCAS, CSI Clinical Social Worker Ph: [phone]

## 2018-02-21 NOTE — Discharge Instructions (Addendum)
DC back to ALF with home health RN and home health physical therapy, also get social worker consult  At [facility] living facility to see if patient needs higher level of care possibly skilled Nursing Facility   Give potassium chloride 40 mEq p.o. x2 for a total of 80 meq on 02/21/2018, and give 40 mEq p.o. x1 on 02/22/2018 then 20 mEq p.o. x1 on 02/23/2018, repeat BMP and serum magnesium on Monday, 02/24/2018   Follow-up with orthopedic surgeon for wrist fracture as advised by orthopedic team   Fall precautions at assisted living facility

## 2018-02-21 NOTE — Progress Notes (Signed)
Patient refused CPAP for tonight. RN is aware. RT will monitor as needed.

## 2018-02-21 NOTE — H&P (Signed)
Patient Demographics:    Rhayne Chatwin, is a 83 y.o. female  MRN: [id number]   DOB - 01-Jun-1934  Admit Date - 02/21/2018  Outpatient Primary MD for the patient is Crist Infante, MD   Assessment & Plan:    Principal Problem:   Syncope, vasovagal Active Problems:   Hypokalemia   Falls   Essential hypertension   Coronary artery disease   OSA on CPAP   Chronic diastolic heart failure (HCC)   Chronic atrial fibrillation   Chronic anticoagulation  1)Syncope- placed on observation status on telemetry monitored unit, watch for arrhythmias, check serial troponins and EKG for rule out ACS protocol, check echocardiogram to rule out significant aortic stenosis or other outflow obstruction, and also to evaluate EF and to rule out segmental/Regional wall motion abnormalities. Check carotid artery Dopplers to rule out hemodynamically significant stenosis-----patient currently in A. fib, replace potassium, check magnesium  2) recurrent falls--- ???  Syncopal versus arrhythmia the setting of hypokalemia, monitor on telemetry, CT head without acute findings, CT C-spine without acute findings, replace electrolytes, get PT and OT eval, get social worker and case management consult, patient may need to have a level of care she is currently residing at an assisted living facility she may need SNF  3)Atrial fibrillation--- continue Eliquis for anticoagulation  4)Dementia--- patient apparently has cognitive deficits at baseline, question about increased confusion however it is unclear how much differentiate from her baseline, CT head without acute findings, UA without evidence of UTI, chest x-ray without acute cardiopulmonary process... Continue Cymbalta and Razadyne  5)Right distal radius and ulnar styloid process fracture--- ?? Colle's  Fracture.... Observe post closed reduction and casting by orthopedic team further management per orthopedic  6)HTN--- okay to resume amlodipine 5 mg daily, and metoprolol,   may use IV Hydralazine 10 mg  Every 4 hours Prn for systolic blood pressure over 160 mmhg  7)H/o CAD--- patient is not a reliable historian unable to tell if she is having any chest discomfort, give a recurrent falls and possible syncope will check troponin, EKG with A. fib without any significant ACS type findings, continue metoprolol and Lipitor   With History of - Reviewed by me  Past Medical History:  Diagnosis Date  . Arthritis   . Atrial fibrillation ([place])   . Bladder polyps   . Cancer ([place])    stomach  . Cataracts, bilateral   . Cervical polyp   . Chronic kidney disease    urinary incont-  . Coronary artery disease    minimal  . Dysrhythmia     AF  hx     dr Acie Fredrickson  . GERD (gastroesophageal reflux disease)   . Hiatal hernia   . Hyperlipidemia   . Hypertension   . Hypokalemia   . IBS (irritable bowel syndrome)   . IFG (impaired fasting glucose)   . Incontinence of urine   . MI, acute, non ST segment elevation ([place]) [id number].[id number]  cath  . OSA (obstructive sleep apnea)    hypoxia , not CO 2 retainer,   . OSA on CPAP 06/16/2013    Sleep study 1- 11-15 with an AHI of 61 , now titrated to CPAP 9 cm water with 2 cm EPR.   . Osteopenia   . Stromal tumor of the stomach [facility])    cancer      Past Surgical History:  Procedure Laterality Date  . ANKLE FRACTURE SURGERY  2006   right, plate   . CARDIAC CATHETERIZATION  12/07/20210   Dr.Nahser,minor cad  . COLONOSCOPY    . COLONOSCOPY N/A Feb 17, 202016   Procedure: COLONOSCOPY;  Surgeon: Inda Castle, MD;  Location: [place];  Service: Endoscopy;  Laterality: N/A;  . ENTEROSCOPY N/A Feb 17, 202016   Procedure: ENTEROSCOPY;  Surgeon: Inda Castle, MD;  Location: [facility] ENDOSCOPY;  Service: Endoscopy;  Laterality: N/A;  . EYE SURGERY     both cataracts  .  KNEE ARTHROSCOPY Right 05/28/2012   Procedure: RIGHT KNEE ARTHROSCOPY PARTIAL MEDIAL AND LATERAL MENISECTOMY WITH CHONDROPLASTY;  Surgeon: Alta Corning, MD;  Location: [place];  Service: Orthopedics;  Laterality: Right;  . stromal stomach  tumor  03/26/2007   resection  . TOTAL KNEE ARTHROPLASTY Right 08/10/2013   Procedure: TOTAL KNEE ARTHROPLASTY;  Surgeon: Alta Corning, MD;  Location: [place];  Service: Orthopedics;  Laterality: Right;  . UPPER GI ENDOSCOPY        Chief Complaint  Patient presents with  . Fall      HPI:    Monicia Tse  is a 83 y.o. female, past medical history relevant for CAD, dementia with cognitive deficits, hypertension and chronic atrial fibrillation on Eliquis for anticoagulation presented to the ED with concerns about recurrent falls and confusion  Apparently patient had a fall on 02/19/2018, EMS attended to her she had a wrist injury she was splinted at that time, she had another fall 02/21/18....   History is very limited due to advanced dementia patient is a very poor historian she unable to tell me if she had any head injury unable to tell me if she has any chest pains palpitations or dizziness from report it may have been mechanical fall but will need to rule out syncope  In ED--- CT head is negative for acute findings, CT C-spine negative for acute findings, right upper extremity x-rays confirmed right distal radius fracture with styloid process fracture.... Orthopedic input noted  In ED... EKG showed A. fib with heart rate around 86, QTc 494... He is found to have a potassium of 2.7, apparently no vomiting or diarrhea.... Serum magnesium is not available this was requested    Review of systems:    In addition to the HPI above,   A full Review of  Systems was done, all other systems reviewed are negative except as noted above in HPI , .    Social History:  Reviewed by me    Social History   Tobacco Use  . Smoking status: Never  Smoker  . Smokeless tobacco: Never Used  Substance Use Topics  . Alcohol use: Yes    Alcohol/week: 2.0 standard drinks    Types: 2 Glasses of wine per week    Comment: one per week       Family History :  Reviewed by me    Family History  Problem Relation Age of Onset  . Pancreatic cancer Paternal Grandmother   . Heart disease Paternal Grandfather  both sides of the family  . Diabetes Paternal Aunt     Home Medications:   Prior to Admission medications   Medication Sig Start Date End Date Taking? Authorizing Provider  amLODipine (NORVASC) 5 MG tablet Take 1 tablet (5 mg total) by mouth daily. *Please call and schedule a one year follow up appointment* Patient taking differently: Take 5 mg by mouth daily.  05/17/16  Yes Nahser, Wonda Cheng, MD  apixaban (ELIQUIS) 2.5 MG TABS tablet Take 1 tablet (2.5 mg total) by mouth 2 (two) times daily. 12/29/17  Yes Eugenie Filler, MD  atorvastatin (LIPITOR) 40 MG tablet Take 20 mg by mouth daily. 1/2 tablet by mouth daily    Yes [provider]  bismuth subsalicylate (PEPTO BISMOL) 262 MG/15ML suspension Take 30 mLs by mouth every 6 (six) hours as needed (vomitting).   Yes [provider]  calcium carbonate (TUMS - DOSED IN MG ELEMENTAL CALCIUM) 500 MG chewable tablet Chew 1 tablet by mouth 3 (three) times daily as needed for indigestion or heartburn.   Yes [provider]  doxazosin (CARDURA) 4 MG tablet TAKE 1 TABLET AT BEDTIME Patient taking differently: Take 4 mg by mouth at bedtime.  12/10/17  Yes Nahser, Wonda Cheng, MD  DULoxetine (CYMBALTA) 20 MG capsule TAKE 1 CAPSULE AT NIGHT FOR 7 NIGHTS THEN TAKE 2 CAPSULES AT NIGHT Patient taking differently: Take 40 mg by mouth at bedtime.  12/11/17  Yes Magnus Sinning, MD  esomeprazole (NEXIUM) 40 MG packet Take 40 mg by mouth daily. DISSOLVE CONTENTS OF 1 PACKET IN 15ML OF WATER AND DRINK EVERY DAY   Yes [provider]  galantamine (RAZADYNE ER) 16 MG  24 hr capsule Take 16 mg by mouth daily with breakfast.   Yes [provider]  metoprolol succinate (TOPROL-XL) 100 MG 24 hr tablet Take 1 tablet (100 mg total) daily by mouth. Take with or immediately following a meal. 01/11/17  Yes Nahser, Wonda Cheng, MD  nitroGLYCERIN (NITROSTAT) 0.4 MG SL tablet Place 1 tablet (0.4 mg total) under the tongue every 5 (five) minutes as needed for chest pain. 12/17/16  Yes Kilroy, Luke K, PA-C  ondansetron (ZOFRAN-ODT) 4 MG disintegrating tablet Take 4 mg by mouth every 8 (eight) hours as needed for nausea or vomiting.   Yes [provider]  ranitidine (ZANTAC) 150 MG tablet Take 150 mg by mouth daily.   Yes [provider]  feeding supplement, ENSURE ENLIVE, (ENSURE ENLIVE) LIQD Take 237 mLs by mouth 2 (two) times daily between meals. 12/29/17   Eugenie Filler, MD  potassium chloride SA (K-DUR,KLOR-CON) 20 MEQ tablet Give potassium chloride 40 mEq p.o. x2 for a total of 80 meq on 02/21/2018, and give 40 mEq p.o. x1 on 02/22/2018 then 20 mEq p.o. x1 on 02/23/2018 02/21/18   Domenic Moras, PA-C     Allergies:     Allergies  Allergen Reactions  . Detrol [Tolterodine] Other (See Comments)    Critical reaction-per MD office  . Tranexamic Acid Other (See Comments)    Patient ineligible for Tranexamic acid due to hx of NSTEMI and CAD.  [name] Xarelto [Rivaroxaban] Other (See Comments)    Critical reaction  . Pradaxa [Dabigatran Etexilate Mesylate] Nausea And Vomiting and Other (See Comments)    Moderate reaction-per MD     Physical Exam:   Vitals  Blood pressure (!) 161/77, pulse 84, temperature 98.7 F (37.1 C), temperature source Oral, resp. rate 20, height 5' 4.5" (1.638 m), weight  59.4 kg, SpO2 95 %.  Physical Examination: General appearance - alert, pleasantly confused, and in no distress and  Mental status - alert, oriented to person, otherwise disoriented Eyes - sclera anicteric Neck - supple, no JVD elevation , Chest - clear   to auscultation bilaterally, symmetrical air movement,  Heart - S1 and S2 normal, irregularly irregular Abdomen - soft, nontender, nondistended, no masses or organomegaly Neurological -no gross deficits noted  extremities - no pedal edema noted, intact peripheral pulses  Skin - warm, dry MSK--right upper extremity in a cast, capillary refill is good    Data Review:    CBC Recent Labs  Lab 02/21/18 1454  WBC 7.3  HGB 12.2  HCT 40.0  PLT 129*  MCV 95.2  MCH 29.0  MCHC 30.5  RDW 12.5  LYMPHSABS 0.9  MONOABS 1.0  EOSABS 0.0  BASOSABS 0.0   ------------------------------------------------------------------------------------------------------------------  Chemistries  Recent Labs  Lab 02/21/18 1341  NA 138  K 2.7*  CL 101  CO2 25  GLUCOSE 107*  BUN 11  CREATININE 0.55  CALCIUM 9.5  AST 25  ALT 12  ALKPHOS 47  BILITOT 2.0*   ------------------------------------------------------------------------------------------------------------------ estimated creatinine clearance is 48.7 mL/min (by C-G formula based on SCr of 0.55 mg/dL). ------------------------------------------------------------------------------------------------------------------  Coagulation profile Recent Labs  Lab 02/21/18 1341  INR 1.09   ------------------------------------------------------------------------------------------------------------------    Component Value Date/Time   BNP 401.1 (H) 06/24/2014 2127   --------------------------------------------------------------------------------------------------------------  Urinalysis    Component Value Date/Time   COLORURINE YELLOW 02/21/2018 1400   APPEARANCEUR CLOUDY (A) 02/21/2018 1400   LABSPEC 1.012 02/21/2018 1400   PHURINE 8.0 02/21/2018 1400   GLUCOSEU NEGATIVE 02/21/2018 1400   HGBUR SMALL (A) 02/21/2018 1400   BILIRUBINUR NEGATIVE 02/21/2018 1400   KETONESUR 20 (A) 02/21/2018 1400   PROTEINUR NEGATIVE 02/21/2018 1400    UROBILINOGEN 1.0 09/08/2007 1024   NITRITE NEGATIVE 02/21/2018 1400   LEUKOCYTESUR NEGATIVE 02/21/2018 1400   ---------------------------------------------------------------------------------------------------------------   Imaging Results:    Dg Chest 2 View  Result Date: 02/21/2018 CLINICAL DATA:  Initial evaluation for acute trauma, fall. EXAM: CHEST - 2 VIEW COMPARISON:  Prior radiograph from 04/27/2016. FINDINGS: Moderate cardiomegaly. Mediastinal silhouette normal. Aortic atherosclerosis. Lungs normally inflated. Mild diffuse pulmonary vascular and interstitial congestion without overt pulmonary edema. No focal infiltrates. No pleural effusion. No pneumothorax. No acute osseous abnormality. Diffuse osteopenia. IMPRESSION: 1. Cardiomegaly with mild diffuse pulmonary vascular and interstitial congestion without overt pulmonary edema. 2. Aortic atherosclerosis. Electronically Signed   By: Jeannine Boga M.D.   On: 02/21/2018 13:20   Dg Forearm Right  Result Date: 02/21/2018 CLINICAL DATA:  Initial evaluation for acute trauma, fall. EXAM: RIGHT FOREARM - 2 VIEW COMPARISON:  None. FINDINGS: Acute transverse fracture through the distal right radius with impaction and slight dorsal angulation. Additional fracture through the base of the ulnar styloid better seen on concomitant radiograph of the hand. No other acute fracture or dislocation about the forearm. Soft tissue swelling overlies the wrist. Mild degenerative changes noted about the elbow. Osteopenia. IMPRESSION: 1. Acute transverse fracture through the distal right radius with impaction and slight dorsal angulation. 2. Acute nondisplaced fracture through the base of the ulnar styloid. 3. No other acute traumatic injury about the forearm. Electronically Signed   By: Jeannine Boga M.D.   On: 02/21/2018 13:22   Ct Head Wo Contrast  Result Date: 02/21/2018 CLINICAL DATA:  [place] out of bed. On Xarelto. History of dementia. Initial  encounter. EXAM: CT HEAD WITHOUT CONTRAST  CT CERVICAL SPINE WITHOUT CONTRAST TECHNIQUE: Multidetector CT imaging of the head and cervical spine was performed following the standard protocol without intravenous contrast. Multiplanar CT image reconstructions of the cervical spine were also generated. COMPARISON:  Brain MRI 09/13/2016 and cervical spine CT 08/31/2007 FINDINGS: CT HEAD FINDINGS Brain: There is no evidence of acute infarct, intracranial hemorrhage, mass, midline shift, or extra-axial fluid collection. Mild cerebral atrophy is within normal limits for age. Patchy cerebral white matter hypodensities are nonspecific but compatible with moderate chronic small vessel ischemic disease. Vascular: Calcified atherosclerosis at the skull base. No hyperdense vessel. Skull: No fracture or focal osseous lesion. Sinuses/Orbits: Minimal left sphenoid sinus mucosal thickening. Clear mastoid air cells. Bilateral cataract extraction. Other: None. CT CERVICAL SPINE FINDINGS Alignment: Unchanged trace anterolisthesis of C3 on C4 and C4 on C5. Skull base and vertebrae: No acute fracture or suspicious osseous lesion. Soft tissues and spinal canal: No prevertebral fluid or swelling. No visible canal hematoma. Disc levels: Similar appearance of mild cervical spondylosis. Mild-to-moderate multilevel facet arthrosis has progressed. Mild left neural foraminal stenosis at C5-6. No osseous spinal canal stenosis. Upper chest: Clear lung apices. Other: Mild calcific atherosclerosis involving the carotid arteries. IMPRESSION: 1. No evidence of acute intracranial abnormality. 2. Moderate chronic small vessel ischemic disease. 3. No evidence of acute fracture or traumatic subluxation in the cervical spine. Electronically Signed   By: Logan Bores M.D.   On: 02/21/2018 13:08   Ct Cervical Spine Wo Contrast  Result Date: 02/21/2018 CLINICAL DATA:  [place] out of bed. On Xarelto. History of dementia. Initial encounter. EXAM: CT HEAD  WITHOUT CONTRAST CT CERVICAL SPINE WITHOUT CONTRAST TECHNIQUE: Multidetector CT imaging of the head and cervical spine was performed following the standard protocol without intravenous contrast. Multiplanar CT image reconstructions of the cervical spine were also generated. COMPARISON:  Brain MRI 09/13/2016 and cervical spine CT 08/31/2007 FINDINGS: CT HEAD FINDINGS Brain: There is no evidence of acute infarct, intracranial hemorrhage, mass, midline shift, or extra-axial fluid collection. Mild cerebral atrophy is within normal limits for age. Patchy cerebral white matter hypodensities are nonspecific but compatible with moderate chronic small vessel ischemic disease. Vascular: Calcified atherosclerosis at the skull base. No hyperdense vessel. Skull: No fracture or focal osseous lesion. Sinuses/Orbits: Minimal left sphenoid sinus mucosal thickening. Clear mastoid air cells. Bilateral cataract extraction. Other: None. CT CERVICAL SPINE FINDINGS Alignment: Unchanged trace anterolisthesis of C3 on C4 and C4 on C5. Skull base and vertebrae: No acute fracture or suspicious osseous lesion. Soft tissues and spinal canal: No prevertebral fluid or swelling. No visible canal hematoma. Disc levels: Similar appearance of mild cervical spondylosis. Mild-to-moderate multilevel facet arthrosis has progressed. Mild left neural foraminal stenosis at C5-6. No osseous spinal canal stenosis. Upper chest: Clear lung apices. Other: Mild calcific atherosclerosis involving the carotid arteries. IMPRESSION: 1. No evidence of acute intracranial abnormality. 2. Moderate chronic small vessel ischemic disease. 3. No evidence of acute fracture or traumatic subluxation in the cervical spine. Electronically Signed   By: Logan Bores M.D.   On: 02/21/2018 13:08   Dg Hand Complete Right  Result Date: 02/21/2018 CLINICAL DATA:  Initial evaluation for acute trauma, fall. EXAM: RIGHT HAND - COMPLETE 3+ VIEW COMPARISON:  None. FINDINGS: Acute  transverse fracture extending through the distal right radius with dorsal angulation, better evaluated on concomitant radiograph of the right wrist. Associated fracture through the base of the ulnar styloid noted as well. No other acute fracture or dislocation about the hand. Moderate to advanced osteoarthritic  changes throughout the hand, most notable at the first [facility] joint and thumb. Diffuse osteopenia. Soft tissue swelling overlies the wrist. IMPRESSION: 1. Acute fractures of the distal right radius and ulnar styloid, better evaluated on concomitant radiograph of the right wrist. 2. No other acute fracture or dislocation about the hand. 3. Moderate to advanced degenerative osteoarthrosis throughout the hand, most notable at the first [facility] joint and thumb. Electronically Signed   By: Jeannine Boga M.D.   On: 02/21/2018 13:18    Radiological Exams on Admission: Dg Chest 2 View  Result Date: 02/21/2018 CLINICAL DATA:  Initial evaluation for acute trauma, fall. EXAM: CHEST - 2 VIEW COMPARISON:  Prior radiograph from 04/27/2016. FINDINGS: Moderate cardiomegaly. Mediastinal silhouette normal. Aortic atherosclerosis. Lungs normally inflated. Mild diffuse pulmonary vascular and interstitial congestion without overt pulmonary edema. No focal infiltrates. No pleural effusion. No pneumothorax. No acute osseous abnormality. Diffuse osteopenia. IMPRESSION: 1. Cardiomegaly with mild diffuse pulmonary vascular and interstitial congestion without overt pulmonary edema. 2. Aortic atherosclerosis. Electronically Signed   By: Jeannine Boga M.D.   On: 02/21/2018 13:20   Dg Forearm Right  Result Date: 02/21/2018 CLINICAL DATA:  Initial evaluation for acute trauma, fall. EXAM: RIGHT FOREARM - 2 VIEW COMPARISON:  None. FINDINGS: Acute transverse fracture through the distal right radius with impaction and slight dorsal angulation. Additional fracture through the base of the ulnar styloid better seen on  concomitant radiograph of the hand. No other acute fracture or dislocation about the forearm. Soft tissue swelling overlies the wrist. Mild degenerative changes noted about the elbow. Osteopenia. IMPRESSION: 1. Acute transverse fracture through the distal right radius with impaction and slight dorsal angulation. 2. Acute nondisplaced fracture through the base of the ulnar styloid. 3. No other acute traumatic injury about the forearm. Electronically Signed   By: Jeannine Boga M.D.   On: 02/21/2018 13:22   Ct Head Wo Contrast  Result Date: 02/21/2018 CLINICAL DATA:  [place] out of bed. On Xarelto. History of dementia. Initial encounter. EXAM: CT HEAD WITHOUT CONTRAST CT CERVICAL SPINE WITHOUT CONTRAST TECHNIQUE: Multidetector CT imaging of the head and cervical spine was performed following the standard protocol without intravenous contrast. Multiplanar CT image reconstructions of the cervical spine were also generated. COMPARISON:  Brain MRI 09/13/2016 and cervical spine CT 08/31/2007 FINDINGS: CT HEAD FINDINGS Brain: There is no evidence of acute infarct, intracranial hemorrhage, mass, midline shift, or extra-axial fluid collection. Mild cerebral atrophy is within normal limits for age. Patchy cerebral white matter hypodensities are nonspecific but compatible with moderate chronic small vessel ischemic disease. Vascular: Calcified atherosclerosis at the skull base. No hyperdense vessel. Skull: No fracture or focal osseous lesion. Sinuses/Orbits: Minimal left sphenoid sinus mucosal thickening. Clear mastoid air cells. Bilateral cataract extraction. Other: None. CT CERVICAL SPINE FINDINGS Alignment: Unchanged trace anterolisthesis of C3 on C4 and C4 on C5. Skull base and vertebrae: No acute fracture or suspicious osseous lesion. Soft tissues and spinal canal: No prevertebral fluid or swelling. No visible canal hematoma. Disc levels: Similar appearance of mild cervical spondylosis. Mild-to-moderate multilevel  facet arthrosis has progressed. Mild left neural foraminal stenosis at C5-6. No osseous spinal canal stenosis. Upper chest: Clear lung apices. Other: Mild calcific atherosclerosis involving the carotid arteries. IMPRESSION: 1. No evidence of acute intracranial abnormality. 2. Moderate chronic small vessel ischemic disease. 3. No evidence of acute fracture or traumatic subluxation in the cervical spine. Electronically Signed   By: Logan Bores M.D.   On: 02/21/2018 13:08   Ct Cervical  Spine Wo Contrast  Result Date: 02/21/2018 CLINICAL DATA:  [place] out of bed. On Xarelto. History of dementia. Initial encounter. EXAM: CT HEAD WITHOUT CONTRAST CT CERVICAL SPINE WITHOUT CONTRAST TECHNIQUE: Multidetector CT imaging of the head and cervical spine was performed following the standard protocol without intravenous contrast. Multiplanar CT image reconstructions of the cervical spine were also generated. COMPARISON:  Brain MRI 09/13/2016 and cervical spine CT 08/31/2007 FINDINGS: CT HEAD FINDINGS Brain: There is no evidence of acute infarct, intracranial hemorrhage, mass, midline shift, or extra-axial fluid collection. Mild cerebral atrophy is within normal limits for age. Patchy cerebral white matter hypodensities are nonspecific but compatible with moderate chronic small vessel ischemic disease. Vascular: Calcified atherosclerosis at the skull base. No hyperdense vessel. Skull: No fracture or focal osseous lesion. Sinuses/Orbits: Minimal left sphenoid sinus mucosal thickening. Clear mastoid air cells. Bilateral cataract extraction. Other: None. CT CERVICAL SPINE FINDINGS Alignment: Unchanged trace anterolisthesis of C3 on C4 and C4 on C5. Skull base and vertebrae: No acute fracture or suspicious osseous lesion. Soft tissues and spinal canal: No prevertebral fluid or swelling. No visible canal hematoma. Disc levels: Similar appearance of mild cervical spondylosis. Mild-to-moderate multilevel facet arthrosis has  progressed. Mild left neural foraminal stenosis at C5-6. No osseous spinal canal stenosis. Upper chest: Clear lung apices. Other: Mild calcific atherosclerosis involving the carotid arteries. IMPRESSION: 1. No evidence of acute intracranial abnormality. 2. Moderate chronic small vessel ischemic disease. 3. No evidence of acute fracture or traumatic subluxation in the cervical spine. Electronically Signed   By: Logan Bores M.D.   On: 02/21/2018 13:08   Dg Hand Complete Right  Result Date: 02/21/2018 CLINICAL DATA:  Initial evaluation for acute trauma, fall. EXAM: RIGHT HAND - COMPLETE 3+ VIEW COMPARISON:  None. FINDINGS: Acute transverse fracture extending through the distal right radius with dorsal angulation, better evaluated on concomitant radiograph of the right wrist. Associated fracture through the base of the ulnar styloid noted as well. No other acute fracture or dislocation about the hand. Moderate to advanced osteoarthritic changes throughout the hand, most notable at the first [facility] joint and thumb. Diffuse osteopenia. Soft tissue swelling overlies the wrist. IMPRESSION: 1. Acute fractures of the distal right radius and ulnar styloid, better evaluated on concomitant radiograph of the right wrist. 2. No other acute fracture or dislocation about the hand. 3. Moderate to advanced degenerative osteoarthrosis throughout the hand, most notable at the first [facility] joint and thumb. Electronically Signed   By: Jeannine Boga M.D.   On: 02/21/2018 13:18   DVT Prophylaxis -SCD/Eliquis AM Labs Ordered, also please review Full Orders  Family Communication: Admission, patients condition and plan of care including tests being ordered have been discussed with the patient (confused)    Code Status - Full Code  Likely DC to  To SNF  Condition   stable*  Roxan Hockey M.D on 02/21/2018 at 7:13 PM Pager---[phone] Go to [email or web address] - password [email or web address] for contact info  [facility] - Office   [phone]

## 2018-02-21 NOTE — Consult Note (Signed)
Reason for Consult:Right wrist fx Referring Physician: J Mesner  [name] is an 83 y.o. female.  HPI: Rethel was at home in independent living at [facility] when she rolled out of bed onto the floor on Tuesday. She got stuck somehow but eventually managed to get up and get help. Apparently she was evaluated by someone there (EMS?) who told her the wrist wasn't broken and wrapped with ACE. She comes in today for evaluation with localized pain to the wrist. She is RHD. She has a hx/o dementia and seems that way though pleasantly so; I'm not sure how much of narrative is accurate.  Past Medical History:  Diagnosis Date  . Arthritis   . Atrial fibrillation ([place])   . Bladder polyps   . Cancer ([place])    stomach  . Cataracts, bilateral   . Cervical polyp   . Chronic kidney disease    urinary incont-  . Coronary artery disease    minimal  . Dysrhythmia     AF  hx     dr Acie Fredrickson  . GERD (gastroesophageal reflux disease)   . Hiatal hernia   . Hyperlipidemia   . Hypertension   . Hypokalemia   . IBS (irritable bowel syndrome)   . IFG (impaired fasting glucose)   . Incontinence of urine   . MI, acute, non ST segment elevation ([place]) MCE.0,2233   cath  . OSA (obstructive sleep apnea)    hypoxia , not CO 2 retainer,   . OSA on CPAP 06/16/2013    Sleep study 1- 11-15 with an AHI of 61 , now titrated to CPAP 9 cm water with 2 cm EPR.   . Osteopenia   . 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 ANKLE FRACTURE SURGERY  2006   right, plate   . CARDIAC CATHETERIZATION  12/07/20210   Dr.Nahser,minor cad  . COLONOSCOPY    . COLONOSCOPY N/A 05/03/2014   Procedure: COLONOSCOPY;  Surgeon: Inda Castle, MD;  Location: [place];  Service: Endoscopy;  Laterality: N/A;  . ENTEROSCOPY N/A 05/03/2014   Procedure: ENTEROSCOPY;  Surgeon: Inda Castle, MD;  Location: [facility] ENDOSCOPY;  Service: Endoscopy;  Laterality: N/A;  . EYE SURGERY      both cataracts  . KNEE ARTHROSCOPY Right 05/28/2012   Procedure: RIGHT KNEE ARTHROSCOPY PARTIAL MEDIAL AND LATERAL MENISECTOMY WITH CHONDROPLASTY;  Surgeon: Alta Corning, MD;  Location: [place];  Service: Orthopedics;  Laterality: Right;  . stromal stomach  tumor  03/26/2007   resection  . TOTAL KNEE ARTHROPLASTY Right 08/10/2013   Procedure: TOTAL KNEE ARTHROPLASTY;  Surgeon: Alta Corning, MD;  Location: [place];  Service: Orthopedics;  Laterality: Right;  . UPPER GI ENDOSCOPY      Family History  Problem Relation Age of Onset  . Pancreatic cancer Paternal Grandmother   . Heart disease Paternal Grandfather        both sides of the family  . Diabetes Paternal Aunt     Social History:  reports that she has never smoked. She has never used smokeless tobacco. She reports current alcohol use of about 2.0 standard drinks of alcohol per week. She reports that she does not use drugs.  Allergies:  Allergies  Allergen Reactions  . Detrol [Tolterodine] Other (See Comments)    Critical reaction-per MD office  . Tranexamic Acid Other (See Comments)    Patient ineligible  for Tranexamic acid due to hx of NSTEMI and CAD.  [name] Xarelto [Rivaroxaban] Other (See Comments)    Critical reaction  . Pradaxa [Dabigatran Etexilate Mesylate] Nausea And Vomiting and Other (See Comments)    Moderate reaction-per MD    Medications: I have reviewed the patient's current medications.  Results for orders placed or performed during the hospital encounter of 02/21/18 (from the past 48 hour(s))  Comprehensive metabolic panel     Status: Abnormal   Collection Time: 02/21/18  1:41 PM  Result Value Ref Range   Sodium 138 135 - 145 mmol/L   Potassium 2.7 (LL) 3.5 - 5.1 mmol/L    Comment: CRITICAL RESULT CALLED TO, READ BACK BY AND VERIFIED WITH: B.MIKELSON,RN 1444 02/21/18 CLARK,S    Chloride 101 98 - 111 mmol/L   CO2 25 22 - 32 mmol/L   Glucose, Bld 107 (H) 70 - 99 mg/dL   BUN 11 8 - 23 mg/dL    Creatinine, Ser 0.55 0.44 - 1.00 mg/dL   Calcium 9.5 8.9 - 10.3 mg/dL   Total Protein 6.6 6.5 - 8.1 g/dL   Albumin 3.9 3.5 - 5.0 g/dL   AST 25 15 - 41 U/L   ALT 12 0 - 44 U/L   Alkaline Phosphatase 47 38 - 126 U/L   Total Bilirubin 2.0 (H) 0.3 - 1.2 mg/dL   GFR calc non Af Amer >60 >60 mL/min   GFR calc Af Amer >60 >60 mL/min   Anion gap 12 5 - 15    Comment: Performed at [facility] [address] [place], [place] [address]  Protime-INR     Status: None   Collection Time: 02/21/18  1:41 PM  Result Value Ref Range   Prothrombin Time 14.0 11.4 - 15.2 seconds   INR 1.09     Comment: Performed at [facility] [address] [place], [place] [address]  Urinalysis, Routine w reflex microscopic     Status: Abnormal   Collection Time: 02/21/18  2:00 PM  Result Value Ref Range   Color, Urine YELLOW YELLOW   APPearance CLOUDY (A) CLEAR   Specific Gravity, Urine 1.012 1.005 - 1.030   pH 8.0 5.0 - 8.0   Glucose, UA NEGATIVE NEGATIVE mg/dL   Hgb urine dipstick SMALL (A) NEGATIVE   Bilirubin Urine NEGATIVE NEGATIVE   Ketones, ur 20 (A) NEGATIVE mg/dL   Protein, ur NEGATIVE NEGATIVE mg/dL   Nitrite NEGATIVE NEGATIVE   Leukocytes, UA NEGATIVE NEGATIVE   RBC / HPF 0-5 0 - 5 RBC/hpf   Bacteria, UA RARE (A) NONE SEEN    Comment: Performed at [facility] [address] [place], [place] [address]    Dg Chest 2 View  Result Date: 02/21/2018 CLINICAL DATA:  Initial evaluation for acute trauma, fall. EXAM: CHEST - 2 VIEW COMPARISON:  Prior radiograph from 04/27/2016. FINDINGS: Moderate cardiomegaly. Mediastinal silhouette normal. Aortic atherosclerosis. Lungs normally inflated. Mild diffuse pulmonary vascular and interstitial congestion without overt pulmonary edema. No focal infiltrates. No pleural effusion. No pneumothorax. No acute osseous abnormality. Diffuse osteopenia. IMPRESSION: 1. Cardiomegaly with mild diffuse pulmonary vascular and interstitial congestion  without overt pulmonary edema. 2. Aortic atherosclerosis. Electronically Signed   By: Jeannine Boga M.D.   On: 02/21/2018 13:20   Dg Forearm Right  Result Date: 02/21/2018 CLINICAL DATA:  Initial evaluation for acute trauma, fall. EXAM: RIGHT FOREARM - 2 VIEW COMPARISON:  None. FINDINGS: Acute transverse fracture through the distal right radius  with impaction and slight dorsal angulation. Additional fracture through the base of the ulnar styloid better seen on concomitant radiograph of the hand. No other acute fracture or dislocation about the forearm. Soft tissue swelling overlies the wrist. Mild degenerative changes noted about the elbow. Osteopenia. IMPRESSION: 1. Acute transverse fracture through the distal right radius with impaction and slight dorsal angulation. 2. Acute nondisplaced fracture through the base of the ulnar styloid. 3. No other acute traumatic injury about the forearm. Electronically Signed   By: Jeannine Boga M.D.   On: 02/21/2018 13:22   Ct Head Wo Contrast  Result Date: 02/21/2018 CLINICAL DATA:  [place] out of bed. On Xarelto. History of dementia. Initial encounter. EXAM: CT HEAD WITHOUT CONTRAST CT CERVICAL SPINE WITHOUT CONTRAST TECHNIQUE: Multidetector CT imaging of the head and cervical spine was performed following the standard protocol without intravenous contrast. Multiplanar CT image reconstructions of the cervical spine were also generated. COMPARISON:  Brain MRI 09/13/2016 and cervical spine CT 08/31/2007 FINDINGS: CT HEAD FINDINGS Brain: There is no evidence of acute infarct, intracranial hemorrhage, mass, midline shift, or extra-axial fluid collection. Mild cerebral atrophy is within normal limits for age. Patchy cerebral white matter hypodensities are nonspecific but compatible with moderate chronic small vessel ischemic disease. Vascular: Calcified atherosclerosis at the skull base. No hyperdense vessel. Skull: No fracture or focal osseous lesion.  Sinuses/Orbits: Minimal left sphenoid sinus mucosal thickening. Clear mastoid air cells. Bilateral cataract extraction. Other: None. CT CERVICAL SPINE FINDINGS Alignment: Unchanged trace anterolisthesis of C3 on C4 and C4 on C5. Skull base and vertebrae: No acute fracture or suspicious osseous lesion. Soft tissues and spinal canal: No prevertebral fluid or swelling. No visible canal hematoma. Disc levels: Similar appearance of mild cervical spondylosis. Mild-to-moderate multilevel facet arthrosis has progressed. Mild left neural foraminal stenosis at C5-6. No osseous spinal canal stenosis. Upper chest: Clear lung apices. Other: Mild calcific atherosclerosis involving the carotid arteries. IMPRESSION: 1. No evidence of acute intracranial abnormality. 2. Moderate chronic small vessel ischemic disease. 3. No evidence of acute fracture or traumatic subluxation in the cervical spine. Electronically Signed   By: Logan Bores M.D.   On: 02/21/2018 13:08   Ct Cervical Spine Wo Contrast  Result Date: 02/21/2018 CLINICAL DATA:  [place] out of bed. On Xarelto. History of dementia. Initial encounter. EXAM: CT HEAD WITHOUT CONTRAST CT CERVICAL SPINE WITHOUT CONTRAST TECHNIQUE: Multidetector CT imaging of the head and cervical spine was performed following the standard protocol without intravenous contrast. Multiplanar CT image reconstructions of the cervical spine were also generated. COMPARISON:  Brain MRI 09/13/2016 and cervical spine CT 08/31/2007 FINDINGS: CT HEAD FINDINGS Brain: There is no evidence of acute infarct, intracranial hemorrhage, mass, midline shift, or extra-axial fluid collection. Mild cerebral atrophy is within normal limits for age. Patchy cerebral white matter hypodensities are nonspecific but compatible with moderate chronic small vessel ischemic disease. Vascular: Calcified atherosclerosis at the skull base. No hyperdense vessel. Skull: No fracture or focal osseous lesion. Sinuses/Orbits: Minimal left  sphenoid sinus mucosal thickening. Clear mastoid air cells. Bilateral cataract extraction. Other: None. CT CERVICAL SPINE FINDINGS Alignment: Unchanged trace anterolisthesis of C3 on C4 and C4 on C5. Skull base and vertebrae: No acute fracture or suspicious osseous lesion. Soft tissues and spinal canal: No prevertebral fluid or swelling. No visible canal hematoma. Disc levels: Similar appearance of mild cervical spondylosis. Mild-to-moderate multilevel facet arthrosis has progressed. Mild left neural foraminal stenosis at C5-6. No osseous spinal canal stenosis. Upper chest: Clear lung apices. Other:  Mild calcific atherosclerosis involving the carotid arteries. IMPRESSION: 1. No evidence of acute intracranial abnormality. 2. Moderate chronic small vessel ischemic disease. 3. No evidence of acute fracture or traumatic subluxation in the cervical spine. Electronically Signed   By: Logan Bores M.D.   On: 02/21/2018 13:08   Dg Hand Complete Right  Result Date: 02/21/2018 CLINICAL DATA:  Initial evaluation for acute trauma, fall. EXAM: RIGHT HAND - COMPLETE 3+ VIEW COMPARISON:  None. FINDINGS: Acute transverse fracture extending through the distal right radius with dorsal angulation, better evaluated on concomitant radiograph of the right wrist. Associated fracture through the base of the ulnar styloid noted as well. No other acute fracture or dislocation about the hand. Moderate to advanced osteoarthritic changes throughout the hand, most notable at the first [facility] joint and thumb. Diffuse osteopenia. Soft tissue swelling overlies the wrist. IMPRESSION: 1. Acute fractures of the distal right radius and ulnar styloid, better evaluated on concomitant radiograph of the right wrist. 2. No other acute fracture or dislocation about the hand. 3. Moderate to advanced degenerative osteoarthrosis throughout the hand, most notable at the first [facility] joint and thumb. Electronically Signed   By: Jeannine Boga M.D.   On:  02/21/2018 13:18    Review of Systems  Constitutional: Negative for weight loss.  HENT: Negative for ear discharge, ear pain, hearing loss and tinnitus.   Eyes: Negative for blurred vision, double vision, photophobia and pain.  Respiratory: Negative for cough, sputum production and shortness of breath.   Cardiovascular: Negative for chest pain.  Gastrointestinal: Negative for abdominal pain, nausea and vomiting.  Genitourinary: Negative for dysuria, flank pain, frequency and urgency.  Musculoskeletal: Positive for joint pain (Right wrist). Negative for back pain, falls, myalgias and neck pain.  Neurological: Negative for dizziness, tingling, sensory change, focal weakness, loss of consciousness and headaches.  Endo/Heme/Allergies: Does not bruise/bleed easily.  Psychiatric/Behavioral: Negative for depression, memory loss and substance abuse. The patient is not nervous/anxious.    Blood pressure (!) 143/78, pulse 80, temperature 98 F (36.7 C), temperature source Oral, resp. rate 16, SpO2 94 %. Physical Exam  Constitutional: She appears well-developed and well-nourished. No distress.  HENT:  Head: Normocephalic and atraumatic.  Eyes: Conjunctivae are normal. Right eye exhibits no discharge. Left eye exhibits no discharge. No scleral icterus.  Neck: Normal range of motion.  Cardiovascular: Normal rate and regular rhythm.  Respiratory: Effort normal. No respiratory distress.  Musculoskeletal:     Comments: Right shoulder, elbow, wrist, digits- no skin wounds, wrist severe swelling, mod TTP  Sens  Ax/R/M/U intact  Mot   Ax/ R/ PIN/ M/ AIN/ U intact  Rad 2+  Neurological: She is alert.  Skin: Skin is warm and dry. She is not diaphoretic.  Psychiatric: She has a normal mood and affect. Her behavior is normal.    Assessment/Plan: Fall Right wrist fx -- Will plan on CR by Dr. Amedeo Plenty followed by splinting. Will see how she does with non-operative management initially. NWB RUE. Multiple  medical problems including dementia, afib on Eliquis, HTN, HLD, GERD, and CAD -- IM admitting for hypokalemia    Lisette Abu, PA-C Orthopedic Surgery [phone] 02/21/2018, 3:07 PM

## 2018-02-21 NOTE — Progress Notes (Signed)
Orthopedic Tech Progress Note Patient Details:  [name] 1934/03/14 [id number]  Casting Type of Cast: Long arm cast Cast Location: [facility]. assisted dr Amedeo Plenty with cast application. Cast Material: Fiberglass Cast Intervention: Application  Post Interventions Patient Tolerated: Well Instructions Provided: Care of device, Adjustment of device     Karolee Stamps 02/21/2018, 7:05 PM

## 2018-02-21 NOTE — Progress Notes (Signed)
DC back to ALF with home health RN and home health physical therapy, also get social worker consult  At [facility] living facility to see if patient needs higher level of care possibly skilled Nursing Facility  Give potassium chloride 40 mEq p.o. x2 for a total of 80 meq on 02/21/2018, and give 40 mEq p.o. x1 on 02/22/2018 then 20 mEq p.o. x1 on 02/23/2018, repeat BMP and serum magnesium on Monday, 02/24/2018  Follow-up with orthopedic surgeon for wrist fracture as advised by orthopedic team  Fall precautions at assisted living facility  Roxan Hockey, MD

## 2018-02-21 NOTE — Progress Notes (Addendum)
CSW received a call from pt's EDP prior to shift change saying there was a suggestion from the hospitalists consulted pt may be helped by a higher-level of care than the pt's current independent living facility ([facility]).  CSW called Marya Amsler the Business Development Director (Synthia Innocent is the ALF Director) provided the CSW with the contact info of Lorie Apley the Resident Director at ph:      Miss Shives stated that [facility] has the [facility] and she feels the Memory Care Unit may be appropriate for the patient while she is recovering from her wrist fracture and undergoing pain management.  Lorie Apley is at ph: [phone].  CSW called Miss Truman Hayward and left a VM and then asked that the Executive Director Ebony Hail Pait contact Miss Truman Hayward to let her know pt is up for D/C.  CSW spoke to pt's RN who confirmed pt is confused and provided CSW with the pt's only contact  Clide Cliff a family friend and RN who asked to be involved at ph: [phone].  CSW called Miss Freeman Caldron and   CSW will continue to follow for D/C needs.  Alphonse Guild. Iman Reinertsen, LCSW, LCAS, CSI Clinical Social Worker Ph: [phone]

## 2018-02-21 NOTE — ED Provider Notes (Signed)
[facility] EMERGENCY DEPARTMENT Provider Note   CSN: [id number] Arrival date & time: 02/21/18  1013     History   Chief Complaint Chief Complaint  Patient presents with  . Fall    HPI [name] is a 83 y.o. female.  83 y.o female with a PMH of Afib, Arthritis, GERD, CAD presents to the ED via EMS s/p fall this morning. Patient reports she was in her bedroom walking to the bathroom when she fell, member the details or whether this was a mechanical fall.  Patient had a similar fall 2 days ago in which EMS was called to the facility to assess her where she had her right hand wrapped.  She did not get evaluated in the ED at this time.  Patient is unable to provide further history, friend at the bedside states she is been very confused lately, she attempted to take patient to her primary care session this morning but patient had another fall. Patient is currently on Xarelto. She reports right hand pain with decrease strength. She denies any dizziness, chest pain, shortness of breath or headache.   The history is provided by the patient and a relative.    Past Medical History:  Diagnosis Date  . Arthritis   . Atrial fibrillation ([place])   . Bladder polyps   . Cancer ([place])    stomach  . Cataracts, bilateral   . Cervical polyp   . Chronic kidney disease    urinary incont-  . Coronary artery disease    minimal  . Dysrhythmia     AF  hx     dr Acie Fredrickson  . GERD (gastroesophageal reflux disease)   . Hiatal hernia   . Hyperlipidemia   . Hypertension   . Hypokalemia   . IBS (irritable bowel syndrome)   . IFG (impaired fasting glucose)   . Incontinence of urine   . MI, acute, non ST segment elevation ([place]) UUV.[id number]   cath  . OSA (obstructive sleep apnea)    hypoxia , not CO 2 retainer,   . OSA on CPAP 06/16/2013    Sleep study 1- 11-15 with an AHI of 61 , now titrated to CPAP 9 cm water with 2 cm EPR.   . Osteopenia   . Stromal tumor of the stomach [facility])      cancer    Patient Active Problem List   Diagnosis Date Noted  . Hyponatremia 12/27/2017  . Traumatic arthritis of ankle, right 08/12/2017  . History of non-ST elevation myocardial infarction (NSTEMI) 12/17/2016  . Chronic anticoagulation 12/17/2016  . Chest pain with moderate risk for cardiac etiology 05/30/2015  . Internal and external hemorrhoids without complication 44/04/4740  . History of GI bleed 06/24/2014  . Chronic diastolic heart failure ([place]) 06/24/2014  . Chronic atrial fibrillation 06/24/2014  . Pre-syncope 05/05/2014  . Osteoarthritis of right knee 08/10/2013  . OSA on CPAP 06/16/2013  . Bradycardia 10/25/2010  . Fatigue 10/25/2010  . Dyspnea 09/18/2010  . Coronary artery disease   . Hypokalemia   . Incontinence of urine   . Stromal tumor of the stomach ([place])   . Dyslipidemia 05/06/2007  . Essential hypertension 05/06/2007  . GERD 05/06/2007  . IRRITABLE BOWEL SYNDROME 05/06/2007    Past Surgical History:  Procedure Laterality Date  . ANKLE FRACTURE SURGERY  2006   right, plate   . CARDIAC CATHETERIZATION  12/07/20210   Dr.Nahser,minor cad  . COLONOSCOPY    . COLONOSCOPY N/A 14-Dec-202016  Procedure: COLONOSCOPY;  Surgeon: Inda Castle, MD;  Location: [facility];  Service: Endoscopy;  Laterality: N/A;  . ENTEROSCOPY N/A 25-Oct-202016   Procedure: ENTEROSCOPY;  Surgeon: Inda Castle, MD;  Location: [facility] ENDOSCOPY;  Service: Endoscopy;  Laterality: N/A;  . EYE SURGERY     both cataracts  . KNEE ARTHROSCOPY Right 05/28/2012   Procedure: RIGHT KNEE ARTHROSCOPY PARTIAL MEDIAL AND LATERAL MENISECTOMY WITH CHONDROPLASTY;  Surgeon: Alta Corning, MD;  Location: [facility];  Service: Orthopedics;  Laterality: Right;  . stromal stomach  tumor  03/26/2007   resection  . TOTAL KNEE ARTHROPLASTY Right 08/10/2013   Procedure: TOTAL KNEE ARTHROPLASTY;  Surgeon: Alta Corning, MD;  Location: [facility];  Service: Orthopedics;  Laterality: Right;  . UPPER GI  ENDOSCOPY       OB History   No obstetric history on file.      Home Medications    Prior to Admission medications   Medication Sig Start Date End Date Taking? Authorizing Provider  amLODipine (NORVASC) 5 MG tablet Take 1 tablet (5 mg total) by mouth daily. *Please call and schedule a one year follow up appointment* Patient taking differently: Take 5 mg by mouth daily.  05/17/16  Yes Nahser, Wonda Cheng, MD  apixaban (ELIQUIS) 2.5 MG TABS tablet Take 1 tablet (2.5 mg total) by mouth 2 (two) times daily. 12/29/17  Yes Eugenie Filler, MD  atorvastatin (LIPITOR) 40 MG tablet Take 20 mg by mouth daily. 1/2 tablet by mouth daily    Yes [provider]  bismuth subsalicylate (PEPTO BISMOL) 262 MG/15ML suspension Take 30 mLs by mouth every 6 (six) hours as needed (vomitting).   Yes [provider]  calcium carbonate (TUMS - DOSED IN MG ELEMENTAL CALCIUM) 500 MG chewable tablet Chew 1 tablet by mouth 3 (three) times daily as needed for indigestion or heartburn.   Yes [provider]  doxazosin (CARDURA) 4 MG tablet TAKE 1 TABLET AT BEDTIME Patient taking differently: Take 4 mg by mouth at bedtime.  12/10/17  Yes Nahser, Wonda Cheng, MD  DULoxetine (CYMBALTA) 20 MG capsule TAKE 1 CAPSULE AT NIGHT FOR 7 NIGHTS THEN TAKE 2 CAPSULES AT NIGHT Patient taking differently: Take 40 mg by mouth at bedtime.  12/11/17  Yes Magnus Sinning, MD  esomeprazole (NEXIUM) 40 MG packet Take 40 mg by mouth daily. DISSOLVE CONTENTS OF 1 PACKET IN 15ML OF WATER AND DRINK EVERY DAY   Yes [provider]  galantamine (RAZADYNE ER) 16 MG 24 hr capsule Take 16 mg by mouth daily with breakfast.   Yes [provider]  metoprolol succinate (TOPROL-XL) 100 MG 24 hr tablet Take 1 tablet (100 mg total) daily by mouth. Take with or immediately following a meal. 01/11/17  Yes Nahser, Wonda Cheng, MD  nitroGLYCERIN (NITROSTAT) 0.4 MG SL tablet Place 1 tablet (0.4 mg total) under the tongue every  5 (five) minutes as needed for chest pain. 12/17/16  Yes Kilroy, Luke K, PA-C  ondansetron (ZOFRAN-ODT) 4 MG disintegrating tablet Take 4 mg by mouth every 8 (eight) hours as needed for nausea or vomiting.   Yes [provider]  ranitidine (ZANTAC) 150 MG tablet Take 150 mg by mouth daily.   Yes [provider]  feeding supplement, ENSURE ENLIVE, (ENSURE ENLIVE) LIQD Take 237 mLs by mouth 2 (two) times daily between meals. 12/29/17   Eugenie Filler, MD  potassium chloride SA (K-DUR,KLOR-CON) 20 MEQ tablet Take 1 tablet (20 mEq total)  by mouth 2 (two) times daily. Patient not taking: Reported on 02/21/2018 04/27/16   Davonna Belling, MD    Family History Family History  Problem Relation Age of Onset  . Pancreatic cancer Paternal Grandmother   . Heart disease Paternal Grandfather        both sides of the family  . Diabetes Paternal Aunt     Social History Social History   Tobacco Use  . Smoking status: Never Smoker  . Smokeless tobacco: Never Used  Substance Use Topics  . Alcohol use: Yes    Alcohol/week: 2.0 standard drinks    Types: 2 Glasses of wine per week    Comment: one per week  . Drug use: No     Allergies   Detrol [tolterodine]; Tranexamic acid; Xarelto [rivaroxaban]; and Pradaxa [dabigatran etexilate mesylate]   Review of Systems Review of Systems  Constitutional: Negative for chills and fever.  HENT: Negative for ear pain and sore throat.   Eyes: Negative for pain and visual disturbance.  Respiratory: Negative for cough and shortness of breath.   Cardiovascular: Negative for chest pain and palpitations.  Gastrointestinal: Negative for abdominal pain, nausea and vomiting.  Genitourinary: Negative for dysuria and hematuria.  Musculoskeletal: Positive for arthralgias and joint swelling. Negative for back pain.  Skin: Negative for color change and rash.  Neurological: Negative for seizures, syncope and headaches.  All other systems reviewed  and are negative.    Physical Exam Updated Vital Signs BP (!) 143/78   Pulse 80   Temp 98 F (36.7 C) (Oral)   Resp 16   SpO2 94%   Physical Exam Vitals signs and nursing note reviewed.  Constitutional:      General: She is not in acute distress.    Appearance: She is well-developed.  HENT:     Head: Normocephalic and atraumatic.     Nose: No congestion or rhinorrhea.     Mouth/Throat:     Pharynx: No oropharyngeal exudate.  Eyes:     Extraocular Movements: Extraocular movements intact.     Pupils: Pupils are equal, round, and reactive to light.  Neck:     Musculoskeletal: Normal range of motion.  Cardiovascular:     Rate and Rhythm: Regular rhythm.     Heart sounds: Normal heart sounds.  Pulmonary:     Effort: Pulmonary effort is normal. No respiratory distress.     Breath sounds: Normal breath sounds.  Abdominal:     General: Bowel sounds are normal. There is no distension.     Palpations: Abdomen is soft.     Tenderness: There is no abdominal tenderness.  Musculoskeletal:        General: No tenderness or deformity.     Right lower leg: No edema.     Left lower leg: No edema.  Skin:    General: Skin is warm and dry.  Neurological:     Mental Status: She is alert and oriented to person, place, and time.     Comments: Alert, oriented, thought content slightly confused. Speech fluent without evidence of aphasia. Able to follow 2 step commands without difficulty.  Cranial Nerves:  II:  Peripheral visual fields grossly normal, pupils, round, reactive to light III,IV, VI: ptosis not present, extra-ocular motions intact bilaterally  V,VII: smile symmetric, facial light touch sensation equal VIII: hearing grossly normal bilaterally  IX,X: midline uvula rise  XI: bilateral shoulder shrug equal and strong XII: midline tongue extension  Motor:  3/5 strength on right upper  extremity, suspect from fall two days ago. Sensation intact.  5/5 in the rest of upper and lower  extremities bilaterally including strong and equal grip strength and dorsiflexion/plantar flexion Sensory: light touch normal in all extremities.         ED Treatments / Results  Labs (all labs ordered are listed, but only abnormal results are displayed) Labs Reviewed  COMPREHENSIVE METABOLIC PANEL - Abnormal; Notable for the following components:      Result Value   Potassium 2.7 (*)    Glucose, Bld 107 (*)    Total Bilirubin 2.0 (*)    All other components within normal limits  URINALYSIS, ROUTINE W REFLEX MICROSCOPIC - Abnormal; Notable for the following components:   APPearance CLOUDY (*)    Hgb urine dipstick SMALL (*)    Ketones, ur 20 (*)    Bacteria, UA RARE (*)    All other components within normal limits  CBC WITH DIFFERENTIAL/PLATELET - Abnormal; Notable for the following components:   Platelets 129 (*)    All other components within normal limits  PROTIME-INR  CBC WITH DIFFERENTIAL/PLATELET    EKG EKG Interpretation  Date/Time:  Friday February 21 2018 12:37:46 EST Ventricular Rate:  86 PR Interval:    QRS Duration: 104 QT Interval:  413 QTC Calculation: 494 R Axis:   -75 Text Interpretation:  Atrial fibrillation Left anterior fascicular block Anteroseptal infarct, age indeterminate Baseline wander in lead(s) I II aVR prolonged QTc Confirmed by Merrily Pew [phone]) on 02/21/2018 3:21:17 PM   Radiology Dg Chest 2 View  Result Date: 02/21/2018 CLINICAL DATA:  Initial evaluation for acute trauma, fall. EXAM: CHEST - 2 VIEW COMPARISON:  Prior radiograph from 04/27/2016. FINDINGS: Moderate cardiomegaly. Mediastinal silhouette normal. Aortic atherosclerosis. Lungs normally inflated. Mild diffuse pulmonary vascular and interstitial congestion without overt pulmonary edema. No focal infiltrates. No pleural effusion. No pneumothorax. No acute osseous abnormality. Diffuse osteopenia. IMPRESSION: 1. Cardiomegaly with mild diffuse pulmonary vascular and interstitial  congestion without overt pulmonary edema. 2. Aortic atherosclerosis. Electronically Signed   By: Jeannine Boga M.D.   On: 02/21/2018 13:20   Dg Forearm Right  Result Date: 02/21/2018 CLINICAL DATA:  Initial evaluation for acute trauma, fall. EXAM: RIGHT FOREARM - 2 VIEW COMPARISON:  None. FINDINGS: Acute transverse fracture through the distal right radius with impaction and slight dorsal angulation. Additional fracture through the base of the ulnar styloid better seen on concomitant radiograph of the hand. No other acute fracture or dislocation about the forearm. Soft tissue swelling overlies the wrist. Mild degenerative changes noted about the elbow. Osteopenia. IMPRESSION: 1. Acute transverse fracture through the distal right radius with impaction and slight dorsal angulation. 2. Acute nondisplaced fracture through the base of the ulnar styloid. 3. No other acute traumatic injury about the forearm. Electronically Signed   By: Jeannine Boga M.D.   On: 02/21/2018 13:22   Ct Head Wo Contrast  Result Date: 02/21/2018 CLINICAL DATA:  [place] out of bed. On Xarelto. History of dementia. Initial encounter. EXAM: CT HEAD WITHOUT CONTRAST CT CERVICAL SPINE WITHOUT CONTRAST TECHNIQUE: Multidetector CT imaging of the head and cervical spine was performed following the standard protocol without intravenous contrast. Multiplanar CT image reconstructions of the cervical spine were also generated. COMPARISON:  Brain MRI 09/13/2016 and cervical spine CT 08/31/2007 FINDINGS: CT HEAD FINDINGS Brain: There is no evidence of acute infarct, intracranial hemorrhage, mass, midline shift, or extra-axial fluid collection. Mild cerebral atrophy is within normal limits for age. Patchy cerebral white matter  hypodensities are nonspecific but compatible with moderate chronic small vessel ischemic disease. Vascular: Calcified atherosclerosis at the skull base. No hyperdense vessel. Skull: No fracture or focal osseous  lesion. Sinuses/Orbits: Minimal left sphenoid sinus mucosal thickening. Clear mastoid air cells. Bilateral cataract extraction. Other: None. CT CERVICAL SPINE FINDINGS Alignment: Unchanged trace anterolisthesis of C3 on C4 and C4 on C5. Skull base and vertebrae: No acute fracture or suspicious osseous lesion. Soft tissues and spinal canal: No prevertebral fluid or swelling. No visible canal hematoma. Disc levels: Similar appearance of mild cervical spondylosis. Mild-to-moderate multilevel facet arthrosis has progressed. Mild left neural foraminal stenosis at C5-6. No osseous spinal canal stenosis. Upper chest: Clear lung apices. Other: Mild calcific atherosclerosis involving the carotid arteries. IMPRESSION: 1. No evidence of acute intracranial abnormality. 2. Moderate chronic small vessel ischemic disease. 3. No evidence of acute fracture or traumatic subluxation in the cervical spine. Electronically Signed   By: Logan Bores M.D.   On: 02/21/2018 13:08   Ct Cervical Spine Wo Contrast  Result Date: 02/21/2018 CLINICAL DATA:  [place] out of bed. On Xarelto. History of dementia. Initial encounter. EXAM: CT HEAD WITHOUT CONTRAST CT CERVICAL SPINE WITHOUT CONTRAST TECHNIQUE: Multidetector CT imaging of the head and cervical spine was performed following the standard protocol without intravenous contrast. Multiplanar CT image reconstructions of the cervical spine were also generated. COMPARISON:  Brain MRI 09/13/2016 and cervical spine CT 08/31/2007 FINDINGS: CT HEAD FINDINGS Brain: There is no evidence of acute infarct, intracranial hemorrhage, mass, midline shift, or extra-axial fluid collection. Mild cerebral atrophy is within normal limits for age. Patchy cerebral white matter hypodensities are nonspecific but compatible with moderate chronic small vessel ischemic disease. Vascular: Calcified atherosclerosis at the skull base. No hyperdense vessel. Skull: No fracture or focal osseous lesion. Sinuses/Orbits:  Minimal left sphenoid sinus mucosal thickening. Clear mastoid air cells. Bilateral cataract extraction. Other: None. CT CERVICAL SPINE FINDINGS Alignment: Unchanged trace anterolisthesis of C3 on C4 and C4 on C5. Skull base and vertebrae: No acute fracture or suspicious osseous lesion. Soft tissues and spinal canal: No prevertebral fluid or swelling. No visible canal hematoma. Disc levels: Similar appearance of mild cervical spondylosis. Mild-to-moderate multilevel facet arthrosis has progressed. Mild left neural foraminal stenosis at C5-6. No osseous spinal canal stenosis. Upper chest: Clear lung apices. Other: Mild calcific atherosclerosis involving the carotid arteries. IMPRESSION: 1. No evidence of acute intracranial abnormality. 2. Moderate chronic small vessel ischemic disease. 3. No evidence of acute fracture or traumatic subluxation in the cervical spine. Electronically Signed   By: Logan Bores M.D.   On: 02/21/2018 13:08   Dg Hand Complete Right  Result Date: 02/21/2018 CLINICAL DATA:  Initial evaluation for acute trauma, fall. EXAM: RIGHT HAND - COMPLETE 3+ VIEW COMPARISON:  None. FINDINGS: Acute transverse fracture extending through the distal right radius with dorsal angulation, better evaluated on concomitant radiograph of the right wrist. Associated fracture through the base of the ulnar styloid noted as well. No other acute fracture or dislocation about the hand. Moderate to advanced osteoarthritic changes throughout the hand, most notable at the first [facility] joint and thumb. Diffuse osteopenia. Soft tissue swelling overlies the wrist. IMPRESSION: 1. Acute fractures of the distal right radius and ulnar styloid, better evaluated on concomitant radiograph of the right wrist. 2. No other acute fracture or dislocation about the hand. 3. Moderate to advanced degenerative osteoarthrosis throughout the hand, most notable at the first [facility] joint and thumb. Electronically Signed   By: Jeannine Boga  M.D.   On: 02/21/2018 13:18    Procedures Procedures (including critical care time)  Medications Ordered in ED Medications  lidocaine (PF) (XYLOCAINE) 1 % injection 20 mL (has no administration in time range)  potassium chloride SA (K-DUR,KLOR-CON) CR tablet 40 mEq (has no administration in time range)  potassium chloride SA (K-DUR,KLOR-CON) CR tablet 40 mEq (has no administration in time range)     Initial Impression / Assessment and Plan / ED Course  I have reviewed the triage vital signs and the nursing notes.  Pertinent labs & imaging results that were available during my care of the patient were reviewed by me and considered in my medical decision making (see chart for details).    Presents from [facility] facility after 2 unwitnessed falls in the past week.  When trying to obtain history from patient she is unable to provide a detailed history, slightly confused on exam.  Does have a right hand swelling along with injury from a fall 2 days ago, evaluated by EMS then but did not seek medical attention in the ED.  Due to the unknown etiology of these falls will order blood work along with EKG and chest x-ray to rule out any sources of infection causing these falls.  At the bedside does report patient is slightly confused at baseline but it has worsened in the past week.  CT head was negative for any acute abnormality, hemorrhage or infarct.  C-spine showed no subluxation, no acute abnormality.  DG chest 2 view showed no consolidation or pneumothorax or pleural effusion.  X-ray of the right forearm and x-ray of the right hand showed a acute:  1. Acute fractures of the distal right radius and ulnar styloid,  better evaluated on concomitant radiograph of the right wrist.  2. No other acute fracture or dislocation about the hand.  3. Moderate to advanced degenerative osteoarthrosis throughout the  hand, most notable at the first [facility] joint and thumb.   1:58 PM Spoke to orthopedics Hilbert Odor, PA who will come evaluate patient while in the ED for her ulnar and radius fractures.  2:52 PM Silvestre Gunner seen patient,Dr. Veronia Beets to reduce and splint right wrist. CMP showed slight potassium decrease 2.7, glucose within normal limits. CBC has hemolyzed,will need redraw.UA showed small hgb, rare bacteria, denies any urinary symptoms.  Will place call for hospitalist admission due to hypokalemia and multiple falls. Potassium replacement ordered.  3:07 PM Spoke to Dr. Eunice Blase who refused admission for unwitnessed falls and hypokalemia.  My attending has spoken to Dr. Eunice Blase, she reports taking another patient at this time will need to place call for flo manager. 4:11 PM  Spoke to Buyer, retail, will re page hospitalist.  4:24 PM Dr. Dayna Barker has spoken to hospitalist, he will come see patient and place consult note. Will place case management consult as well as social work.    Final Clinical Impressions(s) / ED Diagnoses   Final diagnoses:  Hypokalemia  Fall, initial encounter  Closed fracture of distal end of right radius, unspecified fracture morphology, initial encounter    ED Discharge Orders    None       Janeece Fitting, Hershal Coria 02/21/18 1630    Mesner, Corene Cornea, MD 02/21/18 1700

## 2018-02-21 NOTE — ED Notes (Signed)
[name] states it is ok to give [name] her medical info. [phone].

## 2018-02-21 NOTE — Progress Notes (Signed)
Consult request has been received. CSW attempting to follow up at present time.  CSW informed that hospitalist believes pt may need a higher level of care (SNF) due to pt's fall resulting in a fractured wrist.  Pt also  Had a fall two days ago, but did not seek help other than being checked out by EMS and having her hand wrapped.  [name]. Veralyn Lopp, LCSW, LCAS, CSI Clinical Social Worker Ph: [phone]

## 2018-02-21 NOTE — ED Provider Notes (Signed)
Medical screening examination/treatment/procedure(s) were conducted as a shared visit with non-physician practitioner(s) and myself.  I personally evaluated the patient during the encounter.  83 year old female who presents to the emergency department today with multiple unwitnessed falls and history of dementia.  Patient does not remember any of the falls and is found to have hypokalemia on her labs.  Her EKG shows a prolonged QTC of 494.  Discussed with orthopedic about subsequent wrist fracture they will come and reduce in the emergency room likely would not do operative repair.  Discussed with medicine, Dr. Wynelle Cleveland, who refused to see or admit the patient when spoken with by the physician assistant.  I discussed it with Dr. Wynelle Cleveland who stated at that time she could not admit the patient because she has already been assigned another one and that I would need to repage the patient out.  Discussed with Dr. Denton Brick who agreed to see the patient but does not think she needs to be admitted.  He requested that social work and case management be involved the patient can be discharged with potassium replaced at home.  I reinforced my concern that these were unwitnessed falls with abnormal EKG and hypokalemia and my concern for her was that they could have been syncopal events and it would be best to have it repleted while being monitored however it is apparent that neither hospitalist is interested in admitting the patient so after all consultations (with signed notes), case management and social work involvement if all agree that she can go she will be discharged. Care to oncoming team pending case management, social work, orthopedics consult and medicine consult.   EKG Interpretation  Date/Time:  Friday February 21 2018 12:37:46 EST Ventricular Rate:  86 PR Interval:    QRS Duration: 104 QT Interval:  413 QTC Calculation: 494 R Axis:   -75 Text Interpretation:  Atrial fibrillation Left anterior fascicular  block Anteroseptal infarct, age indeterminate Baseline wander in lead(s) I II aVR prolonged QTc Confirmed by Merrily Pew [phone]) on 02/21/2018 3:21:17 PM     Mendell Bontempo, Corene Cornea, MD 02/21/18 1705

## 2018-02-21 NOTE — Progress Notes (Signed)
Orthopedic Tech Progress Note Patient Details:  [name] 1935-01-28 [id number]  Ortho Devices Type of Ortho Device: Arm sling   Post Interventions Patient Tolerated: Well Instructions Provided: Care of device, Adjustment of device   Karolee Stamps 02/21/2018, 7:05 PM

## 2018-02-21 NOTE — ED Notes (Signed)
Dr. Grammig at bedside  

## 2018-02-21 NOTE — ED Triage Notes (Signed)
Pt presents for evaluation of fall today on xarelto. Pt fell out of bed, hx of dementia.

## 2018-02-22 ENCOUNTER — Other Ambulatory Visit: Payer: Self-pay

## 2018-02-22 ENCOUNTER — Observation Stay (HOSPITAL_BASED_OUTPATIENT_CLINIC_OR_DEPARTMENT_OTHER): Payer: Medicare Other

## 2018-02-22 DIAGNOSIS — I482 Chronic atrial fibrillation, unspecified: Secondary | ICD-10-CM

## 2018-02-22 DIAGNOSIS — Z7901 Long term (current) use of anticoagulants: Secondary | ICD-10-CM | POA: Diagnosis not present

## 2018-02-22 DIAGNOSIS — I370 Nonrheumatic pulmonary valve stenosis: Secondary | ICD-10-CM | POA: Diagnosis not present

## 2018-02-22 DIAGNOSIS — I361 Nonrheumatic tricuspid (valve) insufficiency: Secondary | ICD-10-CM

## 2018-02-22 DIAGNOSIS — S52501A Unspecified fracture of the lower end of right radius, initial encounter for closed fracture: Secondary | ICD-10-CM

## 2018-02-22 DIAGNOSIS — R55 Syncope and collapse: Secondary | ICD-10-CM

## 2018-02-22 DIAGNOSIS — I5032 Chronic diastolic (congestive) heart failure: Secondary | ICD-10-CM | POA: Diagnosis not present

## 2018-02-22 DIAGNOSIS — I10 Essential (primary) hypertension: Secondary | ICD-10-CM

## 2018-02-22 LAB — BASIC METABOLIC PANEL
Anion gap: 13 (ref 5–15)
BUN: 7 mg/dL — ABNORMAL LOW (ref 8–23)
CO2: 23 mmol/L (ref 22–32)
CREATININE: 0.58 mg/dL (ref 0.44–1.00)
Calcium: 8.6 mg/dL — ABNORMAL LOW (ref 8.9–10.3)
Chloride: 100 mmol/L (ref 98–111)
GFR calc non Af Amer: >60 mL/min (ref >60)
Glucose, Bld: 90 mg/dL (ref 70–99)
Potassium: 2.8 mmol/L — ABNORMAL LOW (ref 3.5–5.1)
Sodium: 136 mmol/L (ref 135–145)

## 2018-02-22 LAB — CBC
HCT: 34.9 % — ABNORMAL LOW (ref 36.0–46.0)
Hemoglobin: 11.7 g/dL — ABNORMAL LOW (ref 12.0–15.0)
MCH: 30.1 pg (ref 26.0–34.0)
MCHC: 33.5 g/dL (ref 30.0–36.0)
MCV: 89.7 fL (ref 80.0–100.0)
Platelets: 167 10*3/uL (ref 150–400)
RBC: 3.89 MIL/uL (ref 3.87–5.11)
RDW: 12.5 % (ref 11.5–15.5)
WBC: 6.1 10*3/uL (ref 4.0–10.5)
nRBC: 0 % (ref 0.0–0.2)

## 2018-02-22 LAB — TROPONIN I: Troponin I: <0.03 ng/mL (ref <0.03)

## 2018-02-22 LAB — ECHOCARDIOGRAM COMPLETE
Height: 64.5 in
Weight: 2048 oz

## 2018-02-22 NOTE — Progress Notes (Signed)
VASCULAR LAB PRELIMINARY  PRELIMINARY  PRELIMINARY  PRELIMINARY  Carotid duplex completed.    Preliminary report:  1-39% ICA plaquing. Vertebral artery flow is antegrade.  Steve Gregg, RVT 02/22/2018, 8:37 AM

## 2018-02-22 NOTE — Evaluation (Signed)
Physical Therapy Evaluation Patient Details Name: [name] MRN: [id number] DOB: [birth year] Today's Date: 02/22/2018   History of Present Illness  Chevy Virgo  is a 83 y.o. female, past medical history relevant for CAD, dementia with cognitive deficits, hypertension and chronic atrial fibrillation on Eliquis for anticoagulation presented to the ED with concerns about recurrent falls and confusion CT head is negative for acute findings, right upper extremity x-rays confirmed right distal radius fracture with styloid process fracture.    Clinical Impression  Pt admitted with above diagnosis. Pt currently with functional limitations due to the deficits listed below (see PT Problem List). PTA pt resided at [facility]. She was independent with mobility but has experienced multi recent falls, one resulting in R wrist fx. On eval, pt required min assist bed mobility, min assist transfers, and min assist ambulation 180 feet HHA. Pt presents with unsteady gait, decreased safety awareness, and increased risk for falls.  Pt will benefit from skilled PT to increase their independence and safety with mobility to allow discharge to the venue listed below.  Recommending SNF (memory care unit) at discharge. Pt reports she prefers to return to her apartment and would like to decline going anywhere else.     Follow Up Recommendations SNF;Supervision/Assistance - 24 hour(memory care unit at [facility])    Equipment Recommendations  None recommended by PT    Recommendations for Other Services       Precautions / Restrictions Precautions Precautions: Fall Precaution Comments: long arm cast RUE Required Braces or Orthoses: Sling Restrictions Weight Bearing Restrictions: Yes RUE Weight Bearing: Non weight bearing Other Position/Activity Restrictions: RUE sling, no wear schedule specified      Mobility  Bed Mobility Overal bed mobility: Needs Assistance Bed Mobility: Supine to Sit     Supine  to sit: Min assist;HOB elevated     General bed mobility comments: assist to elevate trunk and scoot to EOB  Transfers Overall transfer level: Needs assistance Equipment used: Ambulation equipment used Transfers: Sit to/from [facility] Sit to Stand: Min assist Stand pivot transfers: Min assist       General transfer comment: assist to power up and stabilize balance  Ambulation/Gait Ambulation/Gait assistance: Min assist Gait Distance (Feet): 180 Feet Assistive device: 1 person hand held assist Gait Pattern/deviations: Step-through pattern;Decreased stride length Gait velocity: decreased Gait velocity interpretation: 1.31 - 2.62 ft/sec, indicative of limited community ambulator General Gait Details: unsteady gait, HHA on L with sling in place RUE  Stairs            Wheelchair Mobility    Modified Rankin (Stroke Patients Only)       Balance Overall balance assessment: Needs assistance Sitting-balance support: No upper extremity supported;Feet supported Sitting balance-Leahy Scale: Good     Standing balance support: Single extremity supported;During functional activity Standing balance-Leahy Scale: Poor Standing balance comment: reliant on external support                             Pertinent Vitals/Pain Pain Assessment: No/denies pain    Home Living Family/patient expects to be discharged to:: Private residence Living Arrangements: Alone Available Help at Discharge: Friend(s);Available PRN/intermittently Type of Home: Independent living facility Home Access: Level entry     Home Layout: One level Home Equipment: Cane - single point Additional Comments: [place]- independent living    Prior Function Level of Independence: Needs assistance   Gait / Transfers Assistance Needed: independent mobility, h/o  mulit falls  ADL's / Homemaking Assistance Needed: assist with housekeeping at ILF  Comments: dining room for evening  meal     Hand Dominance   Dominant Hand: Right    Extremity/Trunk Assessment   Upper Extremity Assessment Upper Extremity Assessment: RUE deficits/detail RUE Deficits / Details: long arm cast, NWB    Lower Extremity Assessment Lower Extremity Assessment: Generalized weakness    Cervical / Trunk Assessment Cervical / Trunk Assessment: Normal  Communication   Communication: No difficulties  Cognition Arousal/Alertness: Awake/alert Behavior During Therapy: WFL for tasks assessed/performed Overall Cognitive Status: No family/caregiver present to determine baseline cognitive functioning Area of Impairment: Orientation;Memory;Safety/judgement;Problem solving                 Orientation Level: Disoriented to;Time   Memory: Decreased short-term memory   Safety/Judgement: Decreased awareness of safety   Problem Solving: Slow processing;Difficulty sequencing;Requires verbal cues General Comments: progressive coginitive decline over last several months per chart      General Comments      Exercises     Assessment/Plan    PT Assessment Patient needs continued PT services  PT Problem List Decreased strength;Decreased balance;Decreased knowledge of precautions;Decreased mobility;Decreased activity tolerance;Decreased safety awareness;Decreased cognition       PT Treatment Interventions Functional mobility training;Balance training;Patient/family education;Gait training;Therapeutic activities;Therapeutic exercise;Cognitive remediation    PT Goals (Current goals can be found in the Care Plan section)  Acute Rehab PT Goals Patient Stated Goal: return to her apartment PT Goal Formulation: With patient Time For Goal Achievement: 03/08/18 Potential to Achieve Goals: Good    Frequency Min 3X/week   Barriers to discharge Decreased caregiver support      Co-evaluation               AM-PAC PT "6 Clicks" Mobility  Outcome Measure Help needed turning from your  back to your side while in a flat bed without using bedrails?: None Help needed moving from lying on your back to sitting on the side of a flat bed without using bedrails?: A Little Help needed moving to and from a bed to a chair (including a wheelchair)?: A Little Help needed standing up from a chair using your arms (e.g., wheelchair or bedside chair)?: A Little Help needed to walk in hospital room?: A Little Help needed climbing 3-5 steps with a railing? : A Little 6 Click Score: 19    End of Session Equipment Utilized During Treatment: Gait belt Activity Tolerance: Patient tolerated treatment well Patient left: in chair;with call bell/phone within reach;with nursing/sitter in room;with chair alarm set Nurse Communication: Mobility status PT Visit Diagnosis: Unsteadiness on feet (R26.81);Repeated falls (R29.6);Difficulty in walking, not elsewhere classified (R26.2)    Time: 4098-1191 PT Time Calculation (min) (ACUTE ONLY): 24 min   Charges:   PT Evaluation $PT Eval Low Complexity: 1 Low PT Treatments $Gait Training: 8-22 mins        Lorrin Goodell, PT  Office # [phone] Pager [phone]   Lorriane Shire 02/22/2018, 10:09 AM

## 2018-02-22 NOTE — Progress Notes (Signed)
PROGRESS NOTE    Sabrina TRUSZKOWSKI  UXL:[id number] DOB: 07-Apr-1934 DOA: 02/21/2018 PCP: Crist Infante, MD    Brief Narrative:  [name]  is a 83 y.o. female, past medical history relevant for CAD, dementia with cognitive deficits, hypertension and chronic atrial fibrillation on Eliquis for anticoagulation presented to the ED with concerns about recurrent falls and confusion. Apparently patient had a fall on 02/19/2018, EMS attended to her she had a wrist injury she was splinted at that time, she had another fall 02/21/18. She was admitted for evaluation of syncope Assessment & Plan:   Principal Problem:   Syncope, vasovagal Active Problems:   Essential hypertension   Coronary artery disease   Hypokalemia   OSA on CPAP   Chronic diastolic heart failure (HCC)   Chronic atrial fibrillation   Chronic anticoagulation   Falls   Syncope Differentials include arrhthymias versus vasovagal versus orthostatic hypotension Admitted to telemetry.  Overnight telemetry does not show any arrhthymias.  Echocardiogram ordered and is pending. Patient denies any chest pain or shortness of breath at this time. Check orthostatic vital signs in the morning. Carotid duplex negative for significant stenosis. Initial CT of the head without contrast does not show any acute findings.  PT/OT evaluation recommending SNF at this time.  Social worker consulted for SNF placement.    History of permanent atrial fibrillation Rate controlled and is on Eliquis for anticoagulation at this time.   Dementia Patient has cognitive deficits at baseline.  CT head without acute findings and urinalysis does not show any UTI.   Essential hypertension Well-controlled.   Chronic diastolic heart failure she appears to be tired    DVT prophylaxis: Eliquis.  Code Status: full code Family Communication: None at bedside Disposition Plan: Pending clinical improvement he  Consultants:   none  Procedures:  Echocardiogram.   Antimicrobials:   Subjective: Patient denies any chest pain or shortness of breath  she has a sling on the right upper extremity with a fall 2 weeks ago. Patient reports that she is not back to baseline feels unsteady on her feet and does not remember how many times she fell. Objective: Vitals:   02/21/18 1945 02/22/18 0009 02/22/18 0431 02/22/18 0914  BP: (!) 156/82 137/75 (!) 152/71 (!) 153/96  Pulse: 82 83 90   Resp: 20  16   Temp: 98.3 F (36.8 C) 98.1 F (36.7 C) 97.8 F (36.6 C)   TempSrc: Oral Oral Oral   SpO2: 96% 94% 92%   Weight:   58.1 kg   Height:        Intake/Output Summary (Last 24 hours) at 02/22/2018 1226 Last data filed at 02/22/2018 1128 Gross per 24 hour  Intake 736.96 ml  Output 150 ml  Net 586.96 ml   Filed Weights   02/21/18 1818 02/22/18 0431  Weight: 59.4 kg 58.1 kg    Examination:  General exam: Appears calm and comfortable not in any kind of distress  respiratory system: Clear to auscultation. Respiratory effort normal. Cardiovascular system: S1 & S2 heard, irregular, no JVD  gastrointestinal system: Abdomen is nondistended, soft and nontender. No organomegaly or masses felt. Normal bowel sounds heard. Central nervous system: Alert and oriented to place and person only Extremities: Right upper extremity in sling, no pedal edema Skin: No rashes, lesions or ulcers Psychiatry: . Mood & affect appropriate.     Data Reviewed: I have personally reviewed following labs and imaging studies  CBC: Recent Labs  Lab 02/21/18 1454 02/22/18 0426  WBC 7.3 6.1  NEUTROABS 5.4  --   HGB 12.2 11.7*  HCT 40.0 34.9*  MCV 95.2 89.7  PLT 129* 725   Basic Metabolic Panel: Recent Labs  Lab 02/21/18 1341 02/21/18 1825 02/22/18 0426  NA 138  --  136  K 2.7*  --  2.8*  CL 101  --  100  CO2 25  --  23  GLUCOSE 107*  --  90  BUN 11  --  7*  CREATININE 0.55  --  0.58  CALCIUM 9.5  --  8.6*  MG  --  1.7  --    GFR: Estimated  Creatinine Clearance: 48.7 mL/min (by C-G formula based on SCr of 0.58 mg/dL). Liver Function Tests: Recent Labs  Lab 02/21/18 1341  AST 25  ALT 12  ALKPHOS 47  BILITOT 2.0*  PROT 6.6  ALBUMIN 3.9   No results for input(s): LIPASE, AMYLASE in the last 168 hours. No results for input(s): AMMONIA in the last 168 hours. Coagulation Profile: Recent Labs  Lab 02/21/18 1341  INR 1.09   Cardiac Enzymes: Recent Labs  Lab 02/21/18 2012 02/22/18 0426  TROPONINI <0.03 <0.03   BNP (last 3 results) No results for input(s): PROBNP in the last 8760 hours. HbA1C: No results for input(s): HGBA1C in the last 72 hours. CBG: No results for input(s): GLUCAP in the last 168 hours. Lipid Profile: No results for input(s): CHOL, HDL, LDLCALC, TRIG, CHOLHDL, LDLDIRECT in the last 72 hours. Thyroid Function Tests: No results for input(s): TSH, T4TOTAL, FREET4, T3FREE, THYROIDAB in the last 72 hours. Anemia Panel: No results for input(s): VITAMINB12, FOLATE, FERRITIN, TIBC, IRON, RETICCTPCT in the last 72 hours. Sepsis Labs: No results for input(s): PROCALCITON, LATICACIDVEN in the last 168 hours.  No results found for this or any previous visit (from the past 240 hour(s)).       Radiology Studies: Dg Chest 2 View  Result Date: 02/21/2018 CLINICAL DATA:  Initial evaluation for acute trauma, fall. EXAM: CHEST - 2 VIEW COMPARISON:  Prior radiograph from 04/27/2016. FINDINGS: Moderate cardiomegaly. Mediastinal silhouette normal. Aortic atherosclerosis. Lungs normally inflated. Mild diffuse pulmonary vascular and interstitial congestion without overt pulmonary edema. No focal infiltrates. No pleural effusion. No pneumothorax. No acute osseous abnormality. Diffuse osteopenia. IMPRESSION: 1. Cardiomegaly with mild diffuse pulmonary vascular and interstitial congestion without overt pulmonary edema. 2. Aortic atherosclerosis. Electronically Signed   By: Jeannine Boga M.D.   On: 02/21/2018  13:20   Dg Forearm Right  Result Date: 02/21/2018 CLINICAL DATA:  Initial evaluation for acute trauma, fall. EXAM: RIGHT FOREARM - 2 VIEW COMPARISON:  None. FINDINGS: Acute transverse fracture through the distal right radius with impaction and slight dorsal angulation. Additional fracture through the base of the ulnar styloid better seen on concomitant radiograph of the hand. No other acute fracture or dislocation about the forearm. Soft tissue swelling overlies the wrist. Mild degenerative changes noted about the elbow. Osteopenia. IMPRESSION: 1. Acute transverse fracture through the distal right radius with impaction and slight dorsal angulation. 2. Acute nondisplaced fracture through the base of the ulnar styloid. 3. No other acute traumatic injury about the forearm. Electronically Signed   By: Jeannine Boga M.D.   On: 02/21/2018 13:22   Ct Head Wo Contrast  Result Date: 02/21/2018 CLINICAL DATA:  [place] out of bed. On Xarelto. History of dementia. Initial encounter. EXAM: CT HEAD WITHOUT CONTRAST CT CERVICAL SPINE WITHOUT CONTRAST TECHNIQUE: Multidetector CT imaging of the head and cervical spine was performed  following the standard protocol without intravenous contrast. Multiplanar CT image reconstructions of the cervical spine were also generated. COMPARISON:  Brain MRI 09/13/2016 and cervical spine CT 08/31/2007 FINDINGS: CT HEAD FINDINGS Brain: There is no evidence of acute infarct, intracranial hemorrhage, mass, midline shift, or extra-axial fluid collection. Mild cerebral atrophy is within normal limits for age. Patchy cerebral white matter hypodensities are nonspecific but compatible with moderate chronic small vessel ischemic disease. Vascular: Calcified atherosclerosis at the skull base. No hyperdense vessel. Skull: No fracture or focal osseous lesion. Sinuses/Orbits: Minimal left sphenoid sinus mucosal thickening. Clear mastoid air cells. Bilateral cataract extraction. Other: None. CT  CERVICAL SPINE FINDINGS Alignment: Unchanged trace anterolisthesis of C3 on C4 and C4 on C5. Skull base and vertebrae: No acute fracture or suspicious osseous lesion. Soft tissues and spinal canal: No prevertebral fluid or swelling. No visible canal hematoma. Disc levels: Similar appearance of mild cervical spondylosis. Mild-to-moderate multilevel facet arthrosis has progressed. Mild left neural foraminal stenosis at C5-6. No osseous spinal canal stenosis. Upper chest: Clear lung apices. Other: Mild calcific atherosclerosis involving the carotid arteries. IMPRESSION: 1. No evidence of acute intracranial abnormality. 2. Moderate chronic small vessel ischemic disease. 3. No evidence of acute fracture or traumatic subluxation in the cervical spine. Electronically Signed   By: Logan Bores M.D.   On: 02/21/2018 13:08   Ct Cervical Spine Wo Contrast  Result Date: 02/21/2018 CLINICAL DATA:  [place] out of bed. On Xarelto. History of dementia. Initial encounter. EXAM: CT HEAD WITHOUT CONTRAST CT CERVICAL SPINE WITHOUT CONTRAST TECHNIQUE: Multidetector CT imaging of the head and cervical spine was performed following the standard protocol without intravenous contrast. Multiplanar CT image reconstructions of the cervical spine were also generated. COMPARISON:  Brain MRI 09/13/2016 and cervical spine CT 08/31/2007 FINDINGS: CT HEAD FINDINGS Brain: There is no evidence of acute infarct, intracranial hemorrhage, mass, midline shift, or extra-axial fluid collection. Mild cerebral atrophy is within normal limits for age. Patchy cerebral white matter hypodensities are nonspecific but compatible with moderate chronic small vessel ischemic disease. Vascular: Calcified atherosclerosis at the skull base. No hyperdense vessel. Skull: No fracture or focal osseous lesion. Sinuses/Orbits: Minimal left sphenoid sinus mucosal thickening. Clear mastoid air cells. Bilateral cataract extraction. Other: None. CT CERVICAL SPINE FINDINGS  Alignment: Unchanged trace anterolisthesis of C3 on C4 and C4 on C5. Skull base and vertebrae: No acute fracture or suspicious osseous lesion. Soft tissues and spinal canal: No prevertebral fluid or swelling. No visible canal hematoma. Disc levels: Similar appearance of mild cervical spondylosis. Mild-to-moderate multilevel facet arthrosis has progressed. Mild left neural foraminal stenosis at C5-6. No osseous spinal canal stenosis. Upper chest: Clear lung apices. Other: Mild calcific atherosclerosis involving the carotid arteries. IMPRESSION: 1. No evidence of acute intracranial abnormality. 2. Moderate chronic small vessel ischemic disease. 3. No evidence of acute fracture or traumatic subluxation in the cervical spine. Electronically Signed   By: Logan Bores M.D.   On: 02/21/2018 13:08   Dg Hand Complete Right  Result Date: 02/21/2018 CLINICAL DATA:  Initial evaluation for acute trauma, fall. EXAM: RIGHT HAND - COMPLETE 3+ VIEW COMPARISON:  None. FINDINGS: Acute transverse fracture extending through the distal right radius with dorsal angulation, better evaluated on concomitant radiograph of the right wrist. Associated fracture through the base of the ulnar styloid noted as well. No other acute fracture or dislocation about the hand. Moderate to advanced osteoarthritic changes throughout the hand, most notable at the first [facility] joint and thumb. Diffuse osteopenia. Soft tissue  swelling overlies the wrist. IMPRESSION: 1. Acute fractures of the distal right radius and ulnar styloid, better evaluated on concomitant radiograph of the right wrist. 2. No other acute fracture or dislocation about the hand. 3. 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 Electronically Signed   By: Jeannine Boga M.D.   On: 02/21/2018 13:18        Scheduled Meds: . amLODipine  5 mg Oral Daily  . apixaban  2.5 mg Oral BID  . atorvastatin  20 mg  Oral Daily  . DULoxetine  40 mg Oral QHS  . feeding supplement (ENSURE ENLIVE)  237 mL Oral BID BM  . galantamine  16 mg Oral Q breakfast  . metoprolol succinate  100 mg Oral Daily  . pantoprazole  40 mg Oral Daily  . potassium chloride  40 mEq Oral Once  . sodium chloride flush  3 mL Intravenous Q12H   Continuous Infusions: . sodium chloride Stopped (02/22/18 0046)     LOS: 0 days    Time spent: 29 minutes    Hosie Poisson, MD [facility] Pager [phone] If 7PM-7AM, please contact night-coverage [email or web address] Password [facility] 02/22/2018, 12:26 PM

## 2018-02-22 NOTE — Progress Notes (Signed)
OT Cancellation Note  Patient Details Name: [name] MRN: [id number] DOB: 1934/12/09   Cancelled Treatment:    Reason Eval/Treat Not Completed: Patient at procedure or test/ unavailable(Vascular Lab)  [name] 02/22/2018, 8:15 AM  [name] OTR/L Acute Rehabilitation Services Pager: [phone] Office: [phone]

## 2018-02-22 NOTE — Progress Notes (Signed)
  Echocardiogram 2D Echocardiogram has been performed.  [name] 02/22/2018, 8:34 AM

## 2018-02-22 NOTE — Plan of Care (Signed)
°  Problem: Clinical Measurements: °Goal: Respiratory complications will improve °Outcome: Progressing °Goal: Cardiovascular complication will be avoided °Outcome: Progressing °  °Problem: Coping: °Goal: Level of anxiety will decrease °Outcome: Progressing °  °Problem: Pain Managment: °Goal: General experience of comfort will improve °Outcome: Progressing °  °Problem: Safety: °Goal: Ability to remain free from injury will improve °Outcome: Progressing °  °

## 2018-02-22 NOTE — Evaluation (Signed)
Occupational Therapy Evaluation Patient Details Name: [name] MRN: [id number] DOB: [birth year] Today's Date: 02/22/2018    History of Present Illness [name]  is a 83 y.o. female, past medical history relevant for CAD, dementia with cognitive deficits, hypertension and chronic atrial fibrillation on Eliquis for anticoagulation presented to the ED with concerns about recurrent falls and confusion CT head is negative for acute findings, right upper extremity x-rays confirmed right distal radius fracture with styloid process fracture.   Clinical Impression   Pt admitted with above diagnosis. Pt currently with functional limitations due to the deficits listed below (see OT Problem List). PTA pt resided at [facility]. She was independent in ADL and also with mobility but has experienced multi recent falls, one resulting in R wrist fx. Currently, pt is min assist transfers, and generally min assist for ADL - including tasks that require BUE. Pt has decreased safety awareness, decreased balance, decreased ability to perform ADL due to cast and and increased risk for falls.  Pt will benefit from skilled OT in the acute setting  And afterwards at SNF level of care to maximize safety and independence in ADL and functional transfers. Next session to focus on standing balance and compensatory strategies for BUE grooming tasks.     Follow Up Recommendations  SNF;Supervision/Assistance - 24 hour    Equipment Recommendations  None recommended by OT(defer to next venue of care)    Recommendations for Other Services       Precautions / Restrictions Precautions Precautions: Fall Precaution Comments: long arm cast RUE Required Braces or Orthoses: Sling Restrictions Weight Bearing Restrictions: Yes RUE Weight Bearing: Non weight bearing Other Position/Activity Restrictions: RUE sling, no wear schedule specified      Mobility Bed Mobility                  Transfers Overall  transfer level: Needs assistance Equipment used: 1 person hand held assist Transfers: Sit to/from Stand Sit to Stand: Min assist         General transfer comment: assist to power up and stabilize balance    Balance Overall balance assessment: Needs assistance Sitting-balance support: No upper extremity supported;Feet supported Sitting balance-Leahy Scale: Good     Standing balance support: Single extremity supported;During functional activity Standing balance-Leahy Scale: Poor Standing balance comment: reliant on external support                           ADL either performed or assessed with clinical judgement   ADL Overall ADL's : Needs assistance/impaired Eating/Feeding: Set up   Grooming: Set up;Sitting;Wash/dry face Grooming Details (indicate cue type and reason): extended time as Pt is right hand dominant Upper Body Bathing: Minimal assistance   Lower Body Bathing: Minimal assistance   Upper Body Dressing : Minimal assistance   Lower Body Dressing: Moderate assistance   Toilet Transfer: Minimal assistance Toilet Transfer Details (indicate cue type and reason): HHA for short ambulation Toileting- Clothing Manipulation and Hygiene: Minimal assistance;Sit to/from stand Toileting - Clothing Manipulation Details (indicate cue type and reason): for balance and boost Tub/ Shower Transfer: Moderate assistance   Functional mobility during ADLs: Minimal assistance(HHA) General ADL Comments: decreased awareness of safety, balance, increased falls requiring at least min A, cast which impairs BUE tasks for ADL     Vision   Additional Comments: Pt does not have glasses with her     Perception     Praxis  Pertinent Vitals/Pain Pain Assessment: No/denies pain     Hand Dominance Right   Extremity/Trunk Assessment Upper Extremity Assessment Upper Extremity Assessment: RUE deficits/detail;Generalized weakness RUE Deficits / Details: long arm cast, NWB  - able to perform digit flexion, unable to extend all the way encouraged full finger movement as cast allows RUE: Unable to fully assess due to immobilization RUE Sensation: WNL RUE Coordination: decreased fine motor;decreased gross motor   Lower Extremity Assessment Lower Extremity Assessment: Generalized weakness       Communication Communication Communication: No difficulties   Cognition Arousal/Alertness: Awake/alert Behavior During Therapy: WFL for tasks assessed/performed Overall Cognitive Status: No family/caregiver present to determine baseline cognitive functioning Area of Impairment: Orientation;Memory;Safety/judgement;Problem solving                 Orientation Level: Disoriented to;Time   Memory: Decreased short-term memory   Safety/Judgement: Decreased awareness of safety;Decreased awareness of deficits   Problem Solving: Slow processing;Difficulty sequencing;Requires verbal cues General Comments: progressive coginitive decline over last several months per chart decreased awareness of deficits and unable to recall how many times she's fallen in the past...   General Comments       Exercises Exercises: Other exercises Other Exercises Other Exercises: flexion/entension of fingers outside of cast   Shoulder Instructions      Home Living Family/patient expects to be discharged to:: Other (Comment)(Memory Unit - ILF - [facility]) Living Arrangements: Alone Available Help at Discharge: Friend(s);Available PRN/intermittently Type of Home: Independent living facility Home Access: Level entry     Home Layout: One level     Bathroom Shower/Tub: Occupational psychologist: Handicapped height Bathroom Accessibility: Yes How Accessible: Accessible via walker Home Equipment: [place] - single point;Shower seat - built in;Grab bars - toilet;Grab bars - tub/shower;Hand held shower head   Additional Comments: [facility]- independent living      Prior  Functioning/Environment Level of Independence: Needs assistance  Gait / Transfers Assistance Needed: independent mobility, h/o mulit falls ADL's / Homemaking Assistance Needed: assist with housekeeping/meals at ILF   Comments: dining room for evening meal        OT Problem List: Decreased activity tolerance;Impaired balance (sitting and/or standing);Decreased safety awareness;Decreased knowledge of precautions;Impaired UE functional use      OT Treatment/Interventions: Self-care/ADL training;Therapeutic activities;Balance training;Patient/family education    OT Goals(Current goals can be found in the care plan section) Acute Rehab OT Goals Patient Stated Goal: return to her apartment OT Goal Formulation: With patient Time For Goal Achievement: 03/08/18 Potential to Achieve Goals: Fair ADL Goals Pt Will Perform Grooming: with supervision;standing Pt Will Perform Upper Body Dressing: with set-up;sitting Pt Will Perform Lower Body Dressing: sit to/from stand;with supervision Pt Will Transfer to Toilet: with min guard assist;ambulating Pt Will Perform Toileting - Clothing Manipulation and hygiene: with supervision;sit to/from stand  OT Frequency: Min 2X/week   Barriers to D/C:            Co-evaluation              AM-PAC OT "6 Clicks" Daily Activity     Outcome Measure Help from another person eating meals?: A Little Help from another person taking care of personal grooming?: A Little Help from another person toileting, which includes using toliet, bedpan, or urinal?: A Little Help from another person bathing (including washing, rinsing, drying)?: A Lot Help from another person to put on and taking off regular upper body clothing?: A Lot Help from another person to put on and taking off  regular lower body clothing?: A Lot 6 Click Score: 15   End of Session Equipment Utilized During Treatment: Gait belt;Other (comment)(sling) Nurse Communication: Mobility  status;Precautions;Weight bearing status  Activity Tolerance: Patient tolerated treatment well Patient left: in chair;with chair alarm set;with call bell/phone within reach  OT Visit Diagnosis: Unsteadiness on feet (R26.81);Other abnormalities of gait and mobility (R26.89);Repeated falls (R29.6);History of falling (Z91.81);Muscle weakness (generalized) (M62.81);Other symptoms and signs involving cognitive function                Time: 1137-1212 OT Time Calculation (min): 35 min Charges:  OT General Charges $OT Visit: 1 Visit OT Evaluation $OT Eval Moderate Complexity: 1 Mod OT Treatments $Self Care/Home Management : 8-22 mins  [name] OTR/L Acute Rehabilitation Services Pager: [phone] Office: [place] 02/22/2018, 3:15 PM

## 2018-02-23 DIAGNOSIS — R55 Syncope and collapse: Secondary | ICD-10-CM | POA: Diagnosis not present

## 2018-02-23 DIAGNOSIS — I482 Chronic atrial fibrillation, unspecified: Secondary | ICD-10-CM | POA: Diagnosis not present

## 2018-02-23 DIAGNOSIS — Z7901 Long term (current) use of anticoagulants: Secondary | ICD-10-CM | POA: Diagnosis not present

## 2018-02-23 DIAGNOSIS — E876 Hypokalemia: Secondary | ICD-10-CM | POA: Diagnosis not present

## 2018-02-23 MED ORDER — SODIUM CHLORIDE 0.9 % IV SOLN
INTRAVENOUS | Status: DC
  Administered 2018-02-23 – 2018-02-24 (×3): via INTRAVENOUS

## 2018-02-23 MED ORDER — POTASSIUM CHLORIDE 10 MEQ/100ML IV SOLN
10.0000 meq | INTRAVENOUS | Status: AC
  Administered 2018-02-23 (×2): 10 meq via INTRAVENOUS
  Filled 2018-02-23: qty 100

## 2018-02-23 MED ORDER — MAGNESIUM OXIDE 400 (241.3 MG) MG PO TABS
400.0000 mg | ORAL_TABLET | Freq: Two times a day (BID) | ORAL | Status: DC
  Administered 2018-02-23 – 2018-02-28 (×11): 400 mg via ORAL
  Filled 2018-02-23 (×11): qty 1

## 2018-02-23 MED ORDER — POTASSIUM CHLORIDE CRYS ER 20 MEQ PO TBCR
40.0000 meq | EXTENDED_RELEASE_TABLET | Freq: Once | ORAL | Status: AC
  Administered 2018-02-23: 40 meq via ORAL

## 2018-02-23 MED ORDER — GALANTAMINE HYDROBROMIDE ER 8 MG PO CP24
16.0000 mg | ORAL_CAPSULE | Freq: Every day | ORAL | Status: DC
  Administered 2018-02-24 – 2018-02-28 (×5): 16 mg via ORAL
  Filled 2018-02-23 (×5): qty 2

## 2018-02-23 MED ORDER — POTASSIUM CHLORIDE CRYS ER 20 MEQ PO TBCR
40.0000 meq | EXTENDED_RELEASE_TABLET | Freq: Two times a day (BID) | ORAL | Status: DC
  Filled 2018-02-23: qty 2

## 2018-02-23 NOTE — Progress Notes (Signed)
Pt refusing cpap. Will monitor

## 2018-02-23 NOTE — Progress Notes (Signed)
Pt refuses to wear CPAP for the night. No distress noted. Will continue to monitor.

## 2018-02-23 NOTE — Progress Notes (Signed)
PROGRESS NOTE    [name]  BCW:[id number] DOB: 1934-09-30 DOA: 02/21/2018 PCP: Crist Infante, MD    Brief Narrative:  [name]  is a 83 y.o. female, past medical history relevant for CAD, dementia with cognitive deficits, hypertension and chronic atrial fibrillation on Eliquis for anticoagulation presented to the ED with concerns about recurrent falls and confusion. Apparently patient had a fall on 02/19/2018, EMS attended to her she had a wrist injury she was splinted at that time, she had another fall 02/21/18. She was admitted for evaluation of syncope.   Assessment & Plan:   Principal Problem:   Syncope, vasovagal Active Problems:   Essential hypertension   Coronary artery disease   Hypokalemia   OSA on CPAP   Chronic diastolic heart failure (HCC)   Chronic atrial fibrillation   Chronic anticoagulation   Falls   Syncope Differentials include arrhthymias versus vasovagal versus orthostatic hypotension Admitted to telemetry.  Overnight telemetry shows some PVC'S.  Echocardiogram ordered and is pending. Patient denies any chest pain or shortness of breath at this time. orthostatic vital signs in the morning were positive.start the patient on normal saline. Add TED hoses.  Carotid duplex negative for significant stenosis. Initial CT of the head without contrast does not show any acute findings.  PT/OT evaluation recommending SNF at this time.  Social worker consulted for SNF placement.    History of permanent atrial fibrillation Rate controlled and is on Eliquis for anticoagulation at this time.   Dementia Patient has cognitive deficits at baseline.  CT head without acute findings and urinalysis does not show any UTI.   Essential hypertension Well-controlled.   Chronic diastolic heart failure she appears to be dry.  Gentle hydration.    Hypokalemia ; Replaced.   Right wrist fx -- underwent splinting of the upper extremity.   NWB RUE. Seen by  orthopedics.  Will need follow up tomorrow.   DVT prophylaxis: Eliquis.  Code Status: full code Family Communication: None at bedside Disposition Plan: Pending clinical improvement , SNF on discharge.   Consultants:   none  Procedures: Echocardiogram.   Antimicrobials: None.   Subjective: Reports feeling tired and weak.   Objective: Vitals:   02/22/18 2040 02/23/18 0019 02/23/18 0530 02/23/18 1131  BP: 137/78 (!) 150/84  (!) 146/71  Pulse: 83 65  79  Resp: 18 18  20   Temp: 98.3 F (36.8 C) 98.9 F (37.2 C) 98.7 F (37.1 C) 98.4 F (36.9 C)  TempSrc: Oral Oral Oral Oral  SpO2: 96% 97% 93% 96%  Weight:   56.6 kg   Height:        Intake/Output Summary (Last 24 hours) at 02/23/2018 1453 Last data filed at 02/23/2018 0600 Gross per 24 hour  Intake 240 ml  Output 750 ml  Net -510 ml   Filed Weights   02/21/18 1818 02/22/18 0431 02/23/18 0530  Weight: 59.4 kg 58.1 kg 56.6 kg    Examination:  General exam: Appears calm and comfortable not in any kind of distress  respiratory system: Clear to auscultation. Respiratory effort normal. No wheezing or rhonchi.  Cardiovascular system: S1 & S2 heard, irregular, no JVD  gastrointestinal system: Abdomen is nondistended, soft and nontender. Bowel sounds good.  Central nervous system: Alert and oriented to place and person only Extremities: Right upper extremity in sling, no pedal edema Skin: No rashes, lesions or ulcers Psychiatry: . Mood & affect appropriate.     Data Reviewed: I have personally reviewed following labs  and imaging studies  CBC: Recent Labs  Lab 02/21/18 1454 02/22/18 0426  WBC 7.3 6.1  NEUTROABS 5.4  --   HGB 12.2 11.7*  HCT 40.0 34.9*  MCV 95.2 89.7  PLT 129* 161   Basic Metabolic Panel: Recent Labs  Lab 02/21/18 1341 02/21/18 1825 02/22/18 0426  NA 138  --  136  K 2.7*  --  2.8*  CL 101  --  100  CO2 25  --  23  GLUCOSE 107*  --  90  BUN 11  --  7*  CREATININE 0.55  --  0.58    CALCIUM 9.5  --  8.6*  MG  --  1.7  --    GFR: Estimated Creatinine Clearance: 48.7 mL/min (by C-G formula based on SCr of 0.58 mg/dL). Liver Function Tests: Recent Labs  Lab 02/21/18 1341  AST 25  ALT 12  ALKPHOS 47  BILITOT 2.0*  PROT 6.6  ALBUMIN 3.9   No results for input(s): LIPASE, AMYLASE in the last 168 hours. No results for input(s): AMMONIA in the last 168 hours. Coagulation Profile: Recent Labs  Lab 02/21/18 1341  INR 1.09   Cardiac Enzymes: Recent Labs  Lab 02/21/18 2012 02/22/18 0426  TROPONINI <0.03 <0.03   BNP (last 3 results) No results for input(s): PROBNP in the last 8760 hours. HbA1C: No results for input(s): HGBA1C in the last 72 hours. CBG: No results for input(s): GLUCAP in the last 168 hours. Lipid Profile: No results for input(s): CHOL, HDL, LDLCALC, TRIG, CHOLHDL, LDLDIRECT in the last 72 hours. Thyroid Function Tests: No results for input(s): TSH, T4TOTAL, FREET4, T3FREE, THYROIDAB in the last 72 hours. Anemia Panel: No results for input(s): VITAMINB12, FOLATE, FERRITIN, TIBC, IRON, RETICCTPCT in the last 72 hours. Sepsis Labs: No results for input(s): PROCALCITON, LATICACIDVEN in the last 168 hours.  No results found for this or any previous visit (from the past 240 hour(s)).       Radiology Studies: Vas US Carotid  Result Date: 02/23/2018 Carotid Arterial Duplex Study Indications:       Syncope and Frequent falls. Risk Factors:      Coronary artery disease. Other Factors:     Atrial fibrillation. Comparison Study:  No prior study on file Performing Technologist: Sharion Dove RVS  Examination Guidelines: A complete evaluation includes B-mode imaging, spectral Doppler, color Doppler, and power Doppler as needed of all accessible portions of each vessel. Bilateral testing is considered an integral part of a complete examination. Limited examinations for reoccurring indications may be performed as noted.  Right Carotid Findings:  +----------+--------+--------+--------+------------+------------------+           PSV cm/sEDV cm/sStenosisDescribe    Comments           +----------+--------+--------+--------+------------+------------------+ CCA Prox  79      17                          intimal thickening +----------+--------+--------+--------+------------+------------------+ CCA Distal74      16                          intimal thickening +----------+--------+--------+--------+------------+------------------+ ICA Prox  64      16              heterogenous                   +----------+--------+--------+--------+------------+------------------+ ICA Distal63      19                                             +----------+--------+--------+--------+------------+------------------+  ECA       96      9                                              +----------+--------+--------+--------+------------+------------------+ +----------+--------+-------+--------+-------------------+           PSV cm/sEDV cmsDescribeArm Pressure (mmHG) +----------+--------+-------+--------+-------------------+ [id number]                                         +----------+--------+-------+--------+-------------------+ +---------+--------+--+--------+--+ VertebralPSV cm/s49EDV cm/s11 +---------+--------+--+--------+--+  Left Carotid Findings: +----------+--------+--------+--------+------------+------------------+           PSV cm/sEDV cm/sStenosisDescribe    Comments           +----------+--------+--------+--------+------------+------------------+ CCA Prox  98      15                          intimal thickening +----------+--------+--------+--------+------------+------------------+ CCA Distal75      14                          intimal thickening +----------+--------+--------+--------+------------+------------------+ ICA Prox  58      13              heterogenous                    +----------+--------+--------+--------+------------+------------------+ ICA Distal59      16                                             +----------+--------+--------+--------+------------+------------------+ ECA       86      11                                             +----------+--------+--------+--------+------------+------------------+ +----------+--------+--------+--------+-------------------+ SubclavianPSV cm/sEDV cm/sDescribeArm Pressure (mmHG) +----------+--------+--------+--------+-------------------+           172                                         +----------+--------+--------+--------+-------------------+ +---------+--------+--+--------+--+ VertebralPSV cm/s49EDV cm/s10 +---------+--------+--+--------+--+  Summary: Right Carotid: The extracranial vessels were near-normal with only minimal wall                thickening or plaque. Vertebrals:  Bilateral vertebral arteries demonstrate antegrade flow. Subclavians: Normal flow hemodynamics were seen in bilateral subclavian              arteries. *See table(s) above for measurements and observations.  Electronically signed by Curt Jews MD on 02/23/2018 at 9:05:19 AM.    Final         Scheduled Meds: . amLODipine  5 mg Oral Daily  . apixaban  2.5 mg Oral BID  . atorvastatin  20 mg Oral Daily  . DULoxetine  40 mg Oral QHS  . feeding supplement (ENSURE ENLIVE)  237 mL Oral BID BM  . galantamine  16 mg Oral Q breakfast  .  magnesium oxide  400 mg Oral BID  . metoprolol succinate  100 mg Oral Daily  . pantoprazole  40 mg Oral Daily  . potassium chloride  40 mEq Oral Once  . potassium chloride  40 mEq Oral BID  . sodium chloride flush  3 mL Intravenous Q12H   Continuous Infusions: . sodium chloride Stopped (02/22/18 0046)     LOS: 0 days    Time spent: 29 minutes    Hosie Poisson, MD [facility] Pager [phone] If 7PM-7AM, please contact night-coverage [email or web address] Password  [facility] 02/23/2018, 2:53 PM

## 2018-02-23 NOTE — Plan of Care (Signed)
?  Problem: Clinical Measurements: ?Goal: Respiratory complications will improve ?Outcome: Progressing ?Goal: Cardiovascular complication will be avoided ?Outcome: Progressing ?  ?Problem: Activity: ?Goal: Risk for activity intolerance will decrease ?Outcome: Progressing ?  ?Problem: Pain Managment: ?Goal: General experience of comfort will improve ?Outcome: Progressing ?  ?Problem: Safety: ?Goal: Ability to remain free from injury will improve ?Outcome: Progressing ?  ?

## 2018-02-24 DIAGNOSIS — I951 Orthostatic hypotension: Secondary | ICD-10-CM | POA: Diagnosis not present

## 2018-02-24 DIAGNOSIS — E876 Hypokalemia: Secondary | ICD-10-CM | POA: Diagnosis not present

## 2018-02-24 DIAGNOSIS — I482 Chronic atrial fibrillation, unspecified: Secondary | ICD-10-CM | POA: Diagnosis not present

## 2018-02-24 DIAGNOSIS — R55 Syncope and collapse: Secondary | ICD-10-CM | POA: Diagnosis not present

## 2018-02-24 LAB — BASIC METABOLIC PANEL
Anion gap: 11 (ref 5–15)
BUN: 8 mg/dL (ref 8–23)
CO2: 25 mmol/L (ref 22–32)
Calcium: 8.9 mg/dL (ref 8.9–10.3)
Chloride: 100 mmol/L (ref 98–111)
Creatinine, Ser: 0.66 mg/dL (ref 0.44–1.00)
GFR calc Af Amer: >60 mL/min (ref >60)
GFR calc non Af Amer: >60 mL/min (ref >60)
GLUCOSE: 110 mg/dL — AB (ref 70–99)
Potassium: 3.1 mmol/L — ABNORMAL LOW (ref 3.5–5.1)
Sodium: 136 mmol/L (ref 135–145)

## 2018-02-24 MED ORDER — POTASSIUM CHLORIDE CRYS ER 20 MEQ PO TBCR
40.0000 meq | EXTENDED_RELEASE_TABLET | Freq: Once | ORAL | Status: AC
  Administered 2018-02-24: 40 meq via ORAL
  Filled 2018-02-24: qty 2

## 2018-02-24 NOTE — Progress Notes (Signed)
Patient states that she does not wear CPAP. RT will monitor as needed

## 2018-02-24 NOTE — Plan of Care (Signed)
  Problem: Coping: Goal: Level of anxiety will decrease Outcome: Not Progressing   Problem: Safety: Goal: Ability to remain free from injury will improve Outcome: Not Progressing   Problem: Coping: Goal: Level of anxiety will decrease Outcome: Not Progressing

## 2018-02-24 NOTE — [place] FL2 (Signed)
[place] MEDICAID FL2 LEVEL OF CARE SCREENING TOOL     IDENTIFICATION  Patient Name: [name] Birthdate: Jul 15, 1934 Sex: female Admission Date (Current Location): 02/21/2018  [facility] and [place] Number:  Herbalist and Address:  [facility] [place], [place] [address] [place], [place] [address]      Provider Number: [id number]  Attending Physician Name and Address:  Louellen Molder, MD  Relative Name and Phone Number:  [name] (Late husband's cousin): [phone] or [phone]. He is her financial POA.    Current Level of Care: Hospital Recommended Level of Care: [place] Prior Approval Number:    Date Approved/Denied:   PASRR Number: [id number]  Discharge Plan: SNF    Current Diagnoses: Patient Active Problem List   Diagnosis Date Noted  . Falls 02/21/2018  . Syncope, vasovagal 02/21/2018  . Hyponatremia 12/27/2017  . Traumatic arthritis of ankle, right 08/12/2017  . History of non-ST elevation myocardial infarction (NSTEMI) 12/17/2016  . Chronic anticoagulation 12/17/2016  . Chest pain with moderate risk for cardiac etiology 05/30/2015  . Internal and external hemorrhoids without complication 66/29/4765  . History of GI bleed 06/24/2014  . Chronic diastolic heart failure ([place]) 06/24/2014  . Chronic atrial fibrillation 06/24/2014  . Pre-syncope 05/05/2014  . Osteoarthritis of right knee 08/10/2013  . OSA on CPAP 06/16/2013  . Bradycardia 10/25/2010  . Fatigue 10/25/2010  . Dyspnea 09/18/2010  . Coronary artery disease   . Hypokalemia   . Incontinence of urine   . Stromal tumor of the stomach ([place])   . Dyslipidemia 05/06/2007  . Essential hypertension 05/06/2007  . GERD 05/06/2007  . IRRITABLE BOWEL SYNDROME 05/06/2007    Orientation RESPIRATION BLADDER Height & Weight     Self  Normal, Other (Comment)(CPAP at night) Incontinent, External catheter Weight: 127 lb 11.2 oz (57.9 kg)(scale B) Height:   5' 4.5" (163.8 cm)  BEHAVIORAL SYMPTOMS/MOOD NEUROLOGICAL BOWEL NUTRITION STATUS  (None) (None) Continent Diet(Heart healthy)  AMBULATORY STATUS COMMUNICATION OF NEEDS Skin   Limited Assist Verbally Normal                       Personal Care Assistance Level of Assistance  Bathing, Feeding, Dressing Bathing Assistance: Limited assistance Feeding assistance: Limited assistance Dressing Assistance: Limited assistance     Functional Limitations Info  Sight, Hearing, Speech Sight Info: Adequate Hearing Info: Adequate Speech Info: Adequate    SPECIAL CARE FACTORS FREQUENCY  PT (By licensed PT), Blood pressure, OT (By licensed OT)     PT Frequency: 5 x week OT Frequency: 5 x week            Contractures Contractures Info: Not present    Additional Factors Info  Code Status, Allergies Code Status Info: Full code Allergies Info: Detrol (Tolterodine), Tranexamic Acid, Xarelto (Rivaroxaban), Pradaxa (Dabigatran Etexilate Mesylate).           Current Medications (02/24/2018):  This is the current hospital active medication list Current Facility-Administered Medications  Medication Dose Route Frequency Provider Last Rate Last Dose  . 0.9 %  sodium chloride infusion  250 mL Intravenous PRN Roxan Hockey, MD   Stopped at 02/22/18 0046  . 0.9 %  sodium chloride infusion   Intravenous Continuous Hosie Poisson, MD 50 mL/hr at 02/24/18 0530    . acetaminophen (TYLENOL) tablet 650 mg  650 mg Oral Q6H PRN Emokpae, Courage, MD       Or  . acetaminophen (TYLENOL) suppository 650  mg  650 mg Rectal Q6H PRN Emokpae, Courage, MD      . albuterol (PROVENTIL) (2.5 MG/3ML) 0.083% nebulizer solution 2.5 mg  2.5 mg Nebulization Q2H PRN Emokpae, Courage, MD      . apixaban (ELIQUIS) tablet 2.5 mg  2.5 mg Oral BID Denton Brick, Courage, MD   2.5 mg at 02/24/18 0950  . atorvastatin (LIPITOR) tablet 20 mg  20 mg Oral Daily Emokpae, Courage, MD   20 mg at 02/24/18 0951  . bismuth subsalicylate  (PEPTO BISMOL) 262 MG/15ML suspension 30 mL  30 mL Oral Q6H PRN Emokpae, Courage, MD      . calcium carbonate (TUMS - dosed in mg elemental calcium) chewable tablet 200 mg of elemental calcium  1 tablet Oral TID PRN Emokpae, Courage, MD      . DULoxetine (CYMBALTA) DR capsule 40 mg  40 mg Oral QHS Emokpae, Courage, MD   40 mg at 02/22/18 2100  . feeding supplement (ENSURE ENLIVE) (ENSURE ENLIVE) liquid 237 mL  237 mL Oral BID BM Emokpae, Courage, MD   237 mL at 02/24/18 0954  . galantamine (RAZADYNE ER) 24 hr capsule 16 mg  16 mg Oral Q breakfast Hosie Poisson, MD   16 mg at 02/24/18 0954  . hydrALAZINE (APRESOLINE) injection 10 mg  10 mg Intravenous Q6H PRN Emokpae, Courage, MD      . HYDROcodone-acetaminophen (NORCO/VICODIN) 5-325 MG per tablet 1 tablet  1 tablet Oral Q4H PRN Emokpae, Courage, MD      . magnesium oxide (MAG-OX) tablet 400 mg  400 mg Oral BID Hosie Poisson, MD   400 mg at 02/24/18 0950  . metoprolol succinate (TOPROL-XL) 24 hr tablet 100 mg  100 mg Oral Daily Emokpae, Courage, MD   100 mg at 02/24/18 0950  . nitroGLYCERIN (NITROSTAT) SL tablet 0.4 mg  0.4 mg Sublingual Q5 min PRN Emokpae, Courage, MD      . ondansetron (ZOFRAN) tablet 4 mg  4 mg Oral Q6H PRN Emokpae, Courage, MD       Or  . ondansetron (ZOFRAN) injection 4 mg  4 mg Intravenous Q6H PRN Emokpae, Courage, MD      . pantoprazole (PROTONIX) EC tablet 40 mg  40 mg Oral Daily Emokpae, Courage, MD   40 mg at 02/24/18 0954  . polyethylene glycol (MIRALAX / GLYCOLAX) packet 17 g  17 g Oral Daily PRN Emokpae, Courage, MD      . potassium chloride SA (K-DUR,KLOR-CON) CR tablet 40 mEq  40 mEq Oral Once Emokpae, Courage, MD      . sodium chloride flush (NS) 0.9 % injection 3 mL  3 mL Intravenous Q12H Emokpae, Courage, MD   3 mL at 02/24/18 0955  . sodium chloride flush (NS) 0.9 % injection 3 mL  3 mL Intravenous PRN Roxan Hockey, MD   3 mL at 02/23/18 1845  . traZODone (DESYREL) tablet 50 mg  50 mg Oral QHS PRN Roxan Hockey, MD   50 mg at 02/23/18 2153     Discharge Medications: Please see discharge summary for a list of discharge medications.  Relevant Imaging Results:  Relevant Lab Results:   Additional Information SS#: [id number]. Lives at [facility].  Candie Chroman, LCSW

## 2018-02-24 NOTE — Clinical Social Work Note (Signed)
Clinical Social Work Assessment  Patient Details  Name: [name] MRN: [id number] Date of Birth: 1935/01/02  Date of referral:  02/24/18               Reason for consult:  Facility Placement, Discharge Planning                Permission sought to share information with:  Facility Sport and exercise psychologist, Family Supports Permission granted to share information::     Name::     Nat Bingham and [place]::  SNF's  Relationship::  Ms. Freeman Caldron: Family friend, Mr. Windom: Late husband's first cousin.  Contact Information:  Ms. Freeman Caldron: [phone], Mr. Buchberger: [phone]  Housing/Transportation Living arrangements for the past 2 months:  [place] of Information:  Medical Team, Engineer, materials, Other (Comment Required)(Husband's cousin.) Patient Interpreter Needed:  None Criminal Activity/Legal Involvement Pertinent to Current Situation/Hospitalization:  No - Comment as needed Significant Relationships:  Friend, Other Family Members Lives with:  Self Do you feel safe going back to the place where you live?  Yes Need for family participation in patient care:  Yes (Comment)  Care giving concerns:  Patient is a resident at [facility]. PT recommending SNF vs. Memory care at [facility].   Social Worker assessment / plan:  Received call back from Ms. Freeman Caldron providing contact information for Mr. Kerryann Allaire. He is the patient's late husband's first cousin. Patient's husband passed away two years ago. She does have a son but he is incarcerated in the [place] side of the state. Mr. Probert is financial POA. There is no other family. Ms. Freeman Caldron would prefer SNF placement rather than memory care. First preference would be [facility]. CSW called Mr. Karlton Lemon and notified him of PT recommendations. He agrees with Ms. Bingham. Will keep them both updated on process. [facility] admissions coordinator will review referral.  No further concerns. CSW encouraged Ms. Freeman Caldron and Mr. Dabbs to contact CSW as needed. CSW will continue to follow patient and her supports for support and facilitate discharge to SNF once medically stable.  Employment status:  Retired Nurse, adult PT Recommendations:  Skilled Nursing Facility(vs. memory care unit at [facility]) Information / Referral to community resources:  [place]  Patient/Family's Response to care:  Patient not fully oriented. Ms. Freeman Caldron and Mr. Hogan agreeable to SNF placement. They are both supportive and involved in patient's care. They both appreciated social work intervention.  Patient/Family's Understanding of and Emotional Response to Diagnosis, Current Treatment, and Prognosis:  Patient not fully oriented. Ms. Freeman Caldron and Mr. Vidas have a good understanding of the reason for admission and her need for rehab prior to returning to [place]. They both appear happy with hospital care.  Emotional Assessment Appearance:  Appears stated age Attitude/Demeanor/Rapport:  Unable to Assess Affect (typically observed):  Unable to Assess Orientation:  Oriented to Self Alcohol / Substance use:  Never Used Psych involvement (Current and /or in the community):  No (Comment)  Discharge Needs  Concerns to be addressed:  Care Coordination Readmission within the last 30 days:  No Current discharge risk:  Cognitively Impaired, Dependent with Mobility, Lives alone Barriers to Discharge:  Continued Medical Work up, [place], LCSW 02/24/2018, 12:19 PM

## 2018-02-24 NOTE — Clinical Social Work Placement (Signed)
   CLINICAL SOCIAL WORK PLACEMENT  NOTE  Date:  02/24/2018  Patient Details  Name: [name] MRN: [id number] Date of Birth: December 04, 1934  Clinical Social Work is seeking post-discharge placement for this patient at the [place] level of care (*CSW will initial, date and re-position this form in  chart as items are completed):      Patient/family provided with [facility] Work Department's list of facilities offering this level of care within the geographic area requested by the patient (or if unable, by the patient's family).      Patient/family informed of their freedom to choose among providers that offer the needed level of care, that participate in Medicare, Medicaid or managed care program needed by the patient, have an available bed and are willing to accept the patient.      Patient/family informed of [facility]'s ownership interest in [facility] and [facility], as well as of the fact that they are under no obligation to receive care at these facilities.  PASRR submitted to EDS on 02/24/18     PASRR number received on 02/24/18     Existing PASRR number confirmed on       FL2 transmitted to all facilities in geographic area requested by pt/family on 02/24/18     FL2 transmitted to all facilities within larger geographic area on       Patient informed that his/her managed care company has contracts with or will negotiate with certain facilities, including the following:            Patient/family informed of bed offers received.  Patient chooses bed at       Physician recommends and patient chooses bed at      Patient to be transferred to   on  .  Patient to be transferred to facility by       Patient family notified on   of transfer.  Name of family member notified:        PHYSICIAN Please sign FL2     Additional Comment:    _______________________________________________ Candie Chroman, LCSW 02/24/2018, 12:24  PM

## 2018-02-24 NOTE — Clinical Social Work Note (Signed)
Patient not fully oriented. No supports at bedside. CSW called patient's only emergency contact, Ms. Freeman Caldron. Discussed PT recommendations. She is currently driving but will call CSW back with phone number for patient's financial power of attorney, Jeyda Siebel. Per progress note history he may be her brother-in-law.  Dayton Scrape, [place]

## 2018-02-24 NOTE — Progress Notes (Signed)
Orthostatic vitals completed. Please view flowsheets for results.

## 2018-02-24 NOTE — Progress Notes (Signed)
PROGRESS NOTE                                                                                                                                                                                                             Patient Demographics:    [name], is a 83 y.o. female, DOB - 07/20/1934, XVQ:[id number]  Admit date - 02/21/2018   Admitting Physician Courage Denton Brick, MD  Outpatient Primary MD for the patient is Crist Infante, MD  LOS - 0  Outpatient Specialists: None  Chief Complaint  Patient presents with  . Fall       Brief Narrative   83 year old female with history of CAD, severe dementia with cognitive deficits, hypertension, chronic A. fib on Eliquis presented to the ED from home with recurrent falls and confusion.  Plan for discharge but had another fall on 12/27.  Admitted for syncopal work-up.    Subjective:   No overnight events.  Was tachycardic up to 120s on getting out of bed this morning.   Assessment  & Plan :    Principal Problem:   Syncope, vasovagal versus orthostatic Stable on telemetry except for occasional tachycardia to 120 this morning.  2D echo with normal EF of 60-65%, severely dilated left atrium PA pressure of 47 mmHg.  Carotid Doppler negative for significant stenosis.  Head CT on presentation without acute findings.  PT recommends SNF. Continue gentle hydration for orthostasis., repeat orthostatic vitals for today pending. Continue TED hose.   Active Problems: Permanent atrial fibrillation Continue Eliquis.  Continue metoprolol 100 mg daily.     Coronary artery disease Continue statin and beta-blocker.     Chronic diastolic heart failure (HCC) Getting gentle hydration for dehydration.  Monitor closely.  Frequent falls Possibly secondary to orthostasis/vasovagal.  PT recommends SNF. Patient also has borderline prolonged QTC.  I will hold her trazodone.  Monitor while on  Cymbalta.  Hypokalemia Replenished  OSA on nighttime CPAP   Code Status : Full code  Family Communication  : None at bedside  Disposition Plan  : SNF possibly tomorrow  Barriers For Discharge : Improving symptoms  Consults  : None  Procedures  : CT head, 2D echo, carotid Doppler  DVT Prophylaxis  : Eliquis  Lab Results  Component Value Date   PLT 167 02/22/2018    Antibiotics  :  Anti-infectives (From admission, onward)   Start     Dose/Rate Route Frequency Ordered Stop   02/21/18 1323  tobramycin (NEBCIN) 1.2 g powder  Status:  Discontinued    Note to Pharmacy:  Mervyn Skeeters   : cabinet override      02/21/18 1323 02/21/18 1327        Objective:   Vitals:   02/24/18 0530 02/24/18 0600 02/24/18 0954 02/24/18 1250  BP:  135/80 (!) 172/85 (!) 144/80  Pulse:  84 96 83  Resp:  18 18 18   Temp:  98.2 F (36.8 C)  98.4 F (36.9 C)  TempSrc:  Oral  Oral  SpO2:  95% 97% 97%  Weight: 57.9 kg     Height:        Wt Readings from Last 3 Encounters:  02/24/18 57.9 kg  01/16/18 63.5 kg  12/29/17 60.8 kg     Intake/Output Summary (Last 24 hours) at 02/24/2018 1544 Last data filed at 02/24/2018 1159 Gross per 24 hour  Intake 1120.75 ml  Output 751 ml  Net 369.75 ml     Physical Exam  Gen: not in distress HEENT: no pallor, moist mucosa, supple neck Chest: clear b/l, no added sounds CVS: S1 and S2 regular, no murmurs GI: soft, NT, ND, BS+ Musculoskeletal: warm, no edema CNS: AAOX0    Data Review:    CBC Recent Labs  Lab 02/21/18 1454 02/22/18 0426  WBC 7.3 6.1  HGB 12.2 11.7*  HCT 40.0 34.9*  PLT 129* 167  MCV 95.2 89.7  MCH 29.0 30.1  MCHC 30.5 33.5  RDW 12.5 12.5  LYMPHSABS 0.9  --   MONOABS 1.0  --   EOSABS 0.0  --   BASOSABS 0.0  --     Chemistries  Recent Labs  Lab 02/21/18 1341 02/21/18 1825 02/22/18 0426 02/24/18 0505  NA 138  --  136 136  K 2.7*  --  2.8* 3.1*  CL 101  --  100 100  CO2 25  --  23 25  GLUCOSE 107*   --  90 110*  BUN 11  --  7* 8  CREATININE 0.55  --  0.58 0.66  CALCIUM 9.5  --  8.6* 8.9  MG  --  1.7  --   --   AST 25  --   --   --   ALT 12  --   --   --   ALKPHOS 47  --   --   --   BILITOT 2.0*  --   --   --    ------------------------------------------------------------------------------------------------------------------ No results for input(s): CHOL, HDL, LDLCALC, TRIG, CHOLHDL, LDLDIRECT in the last 72 hours.  No results found for: HGBA1C ------------------------------------------------------------------------------------------------------------------ No results for input(s): TSH, T4TOTAL, T3FREE, THYROIDAB in the last 72 hours.  Invalid input(s): FREET3 ------------------------------------------------------------------------------------------------------------------ No results for input(s): VITAMINB12, FOLATE, FERRITIN, TIBC, IRON, RETICCTPCT in the last 72 hours.  Coagulation profile Recent Labs  Lab 02/21/18 1341  INR 1.09    No results for input(s): DDIMER in the last 72 hours.  Cardiac Enzymes Recent Labs  Lab 02/21/18 2012 02/22/18 0426  TROPONINI <0.03 <0.03   ------------------------------------------------------------------------------------------------------------------    Component Value Date/Time   BNP 401.1 (H) 06/24/2014 2127    Inpatient Medications  Scheduled Meds: . apixaban  2.5 mg Oral BID  . atorvastatin  20 mg Oral Daily  . DULoxetine  40 mg Oral QHS  . feeding supplement (ENSURE ENLIVE)  237 mL Oral BID BM  .  galantamine  16 mg Oral Q breakfast  . magnesium oxide  400 mg Oral BID  . metoprolol succinate  100 mg Oral Daily  . pantoprazole  40 mg Oral Daily  . potassium chloride  40 mEq Oral Once  . sodium chloride flush  3 mL Intravenous Q12H   Continuous Infusions: . sodium chloride Stopped (02/22/18 0046)  . sodium chloride 50 mL/hr at 02/24/18 0530   PRN Meds:.sodium chloride, acetaminophen **OR** acetaminophen,  albuterol, bismuth subsalicylate, calcium carbonate, hydrALAZINE, HYDROcodone-acetaminophen, nitroGLYCERIN, ondansetron **OR** ondansetron (ZOFRAN) IV, polyethylene glycol, sodium chloride flush, traZODone  Micro Results No results found for this or any previous visit (from the past 240 hour(s)).  Radiology Reports Dg Chest 2 View  Result Date: 02/21/2018 CLINICAL DATA:  Initial evaluation for acute trauma, fall. EXAM: CHEST - 2 VIEW COMPARISON:  Prior radiograph from 04/27/2016. FINDINGS: Moderate cardiomegaly. Mediastinal silhouette normal. Aortic atherosclerosis. Lungs normally inflated. Mild diffuse pulmonary vascular and interstitial congestion without overt pulmonary edema. No focal infiltrates. No pleural effusion. No pneumothorax. No acute osseous abnormality. Diffuse osteopenia. IMPRESSION: 1. Cardiomegaly with mild diffuse pulmonary vascular and interstitial congestion without overt pulmonary edema. 2. Aortic atherosclerosis. Electronically Signed   By: Jeannine Boga M.D.   On: 02/21/2018 13:20   Dg Forearm Right  Result Date: 02/21/2018 CLINICAL DATA:  Initial evaluation for acute trauma, fall. EXAM: RIGHT FOREARM - 2 VIEW COMPARISON:  None. FINDINGS: Acute transverse fracture through the distal right radius with impaction and slight dorsal angulation. Additional fracture through the base of the ulnar styloid better seen on concomitant radiograph of the hand. No other acute fracture or dislocation about the forearm. Soft tissue swelling overlies the wrist. Mild degenerative changes noted about the elbow. Osteopenia. IMPRESSION: 1. Acute transverse fracture through the distal right radius with impaction and slight dorsal angulation. 2. Acute nondisplaced fracture through the base of the ulnar styloid. 3. No other acute traumatic injury about the forearm. Electronically Signed   By: Jeannine Boga M.D.   On: 02/21/2018 13:22   Ct Head Wo Contrast  Result Date:  02/21/2018 CLINICAL DATA:  [place] out of bed. On Xarelto. History of dementia. Initial encounter. EXAM: CT HEAD WITHOUT CONTRAST CT CERVICAL SPINE WITHOUT CONTRAST TECHNIQUE: Multidetector CT imaging of the head and cervical spine was performed following the standard protocol without intravenous contrast. Multiplanar CT image reconstructions of the cervical spine were also generated. COMPARISON:  Brain MRI 09/13/2016 and cervical spine CT 08/31/2007 FINDINGS: CT HEAD FINDINGS Brain: There is no evidence of acute infarct, intracranial hemorrhage, mass, midline shift, or extra-axial fluid collection. Mild cerebral atrophy is within normal limits for age. Patchy cerebral white matter hypodensities are nonspecific but compatible with moderate chronic small vessel ischemic disease. Vascular: Calcified atherosclerosis at the skull base. No hyperdense vessel. Skull: No fracture or focal osseous lesion. Sinuses/Orbits: Minimal left sphenoid sinus mucosal thickening. Clear mastoid air cells. Bilateral cataract extraction. Other: None. CT CERVICAL SPINE FINDINGS Alignment: Unchanged trace anterolisthesis of C3 on C4 and C4 on C5. Skull base and vertebrae: No acute fracture or suspicious osseous lesion. Soft tissues and spinal canal: No prevertebral fluid or swelling. No visible canal hematoma. Disc levels: Similar appearance of mild cervical spondylosis. Mild-to-moderate multilevel facet arthrosis has progressed. Mild left neural foraminal stenosis at C5-6. No osseous spinal canal stenosis. Upper chest: Clear lung apices. Other: Mild calcific atherosclerosis involving the carotid arteries. IMPRESSION: 1. No evidence of acute intracranial abnormality. 2. Moderate chronic small vessel ischemic disease. 3. No evidence of  acute fracture or traumatic subluxation in the cervical spine. Electronically Signed   By: Logan Bores M.D.   On: 02/21/2018 13:08   Ct Cervical Spine Wo Contrast  Result Date: 02/21/2018 CLINICAL DATA:   [place] out of bed. On Xarelto. History of dementia. Initial encounter. EXAM: CT HEAD WITHOUT CONTRAST CT CERVICAL SPINE WITHOUT CONTRAST TECHNIQUE: Multidetector CT imaging of the head and cervical spine was performed following the standard protocol without intravenous contrast. Multiplanar CT image reconstructions of the cervical spine were also generated. COMPARISON:  Brain MRI 09/13/2016 and cervical spine CT 08/31/2007 FINDINGS: CT HEAD FINDINGS Brain: There is no evidence of acute infarct, intracranial hemorrhage, mass, midline shift, or extra-axial fluid collection. Mild cerebral atrophy is within normal limits for age. Patchy cerebral white matter hypodensities are nonspecific but compatible with moderate chronic small vessel ischemic disease. Vascular: Calcified atherosclerosis at the skull base. No hyperdense vessel. Skull: No fracture or focal osseous lesion. Sinuses/Orbits: Minimal left sphenoid sinus mucosal thickening. Clear mastoid air cells. Bilateral cataract extraction. Other: None. CT CERVICAL SPINE FINDINGS Alignment: Unchanged trace anterolisthesis of C3 on C4 and C4 on C5. Skull base and vertebrae: No acute fracture or suspicious osseous lesion. Soft tissues and spinal canal: No prevertebral fluid or swelling. No visible canal hematoma. Disc levels: Similar appearance of mild cervical spondylosis. Mild-to-moderate multilevel facet arthrosis has progressed. Mild left neural foraminal stenosis at C5-6. No osseous spinal canal stenosis. Upper chest: Clear lung apices. Other: Mild calcific atherosclerosis involving the carotid arteries. IMPRESSION: 1. No evidence of acute intracranial abnormality. 2. Moderate chronic small vessel ischemic disease. 3. No evidence of acute fracture or traumatic subluxation in the cervical spine. Electronically Signed   By: Logan Bores M.D.   On: 02/21/2018 13:08   Dg Hand Complete Right  Result Date: 02/21/2018 CLINICAL DATA:  Initial evaluation for acute trauma,  fall. EXAM: RIGHT HAND - COMPLETE 3+ VIEW COMPARISON:  None. FINDINGS: Acute transverse fracture extending through the distal right radius with dorsal angulation, better evaluated on concomitant radiograph of the right wrist. Associated fracture through the base of the ulnar styloid noted as well. No other acute fracture or dislocation about the hand. Moderate to advanced osteoarthritic changes throughout the hand, most notable at the first [facility] joint and thumb. Diffuse osteopenia. Soft tissue swelling overlies the wrist. IMPRESSION: 1. Acute fractures of the distal right radius and ulnar styloid, better evaluated on concomitant radiograph of the right wrist. 2. No other acute fracture or dislocation about the hand. 3. Moderate to advanced degenerative osteoarthrosis throughout the hand, most notable at the first [facility] joint and thumb. Electronically Signed   By: Jeannine Boga M.D.   On: 02/21/2018 13:18   Vas US Carotid  Result Date: 02/23/2018 Carotid Arterial Duplex Study Indications:       Syncope and Frequent falls. Risk Factors:      Coronary artery disease. Other Factors:     Atrial fibrillation. Comparison Study:  No prior study on file Performing Technologist: Sharion Dove RVS  Examination Guidelines: A complete evaluation includes B-mode imaging, spectral Doppler, color Doppler, and power Doppler as needed of all accessible portions of each vessel. Bilateral testing is considered an integral part of a complete examination. Limited examinations for reoccurring indications may be performed as noted.  Right Carotid Findings: +----------+--------+--------+--------+------------+------------------+           PSV cm/sEDV cm/sStenosisDescribe    Comments           +----------+--------+--------+--------+------------+------------------+ CCA Prox  79  17                          intimal thickening +----------+--------+--------+--------+------------+------------------+ CCA Distal74       16                          intimal thickening +----------+--------+--------+--------+------------+------------------+ ICA Prox  64      16              heterogenous                   +----------+--------+--------+--------+------------+------------------+ ICA Distal63      19                                             +----------+--------+--------+--------+------------+------------------+ ECA       96      9                                              +----------+--------+--------+--------+------------+------------------+ +----------+--------+-------+--------+-------------------+           PSV cm/sEDV cmsDescribeArm Pressure (mmHG) +----------+--------+-------+--------+-------------------+ [id number]                                         +----------+--------+-------+--------+-------------------+ +---------+--------+--+--------+--+ VertebralPSV cm/s49EDV cm/s11 +---------+--------+--+--------+--+  Left Carotid Findings: +----------+--------+--------+--------+------------+------------------+           PSV cm/sEDV cm/sStenosisDescribe    Comments           +----------+--------+--------+--------+------------+------------------+ CCA Prox  98      15                          intimal thickening +----------+--------+--------+--------+------------+------------------+ CCA Distal75      14                          intimal thickening +----------+--------+--------+--------+------------+------------------+ ICA Prox  58      13              heterogenous                   +----------+--------+--------+--------+------------+------------------+ ICA Distal59      16                                             +----------+--------+--------+--------+------------+------------------+ ECA       86      11                                             +----------+--------+--------+--------+------------+------------------+  +----------+--------+--------+--------+-------------------+ SubclavianPSV cm/sEDV cm/sDescribeArm Pressure (mmHG) +----------+--------+--------+--------+-------------------+           172                                         +----------+--------+--------+--------+-------------------+ +---------+--------+--+--------+--+  VertebralPSV cm/s49EDV cm/s10 +---------+--------+--+--------+--+  Summary: Right Carotid: The extracranial vessels were near-normal with only minimal wall                thickening or plaque. Vertebrals:  Bilateral vertebral arteries demonstrate antegrade flow. Subclavians: Normal flow hemodynamics were seen in bilateral subclavian              arteries. *See table(s) above for measurements and observations.  Electronically signed by Curt Jews MD on 02/23/2018 at 9:05:19 AM.    Final     Time Spent in minutes  25   Kieffer Blatz M.D on 02/24/2018 at 3:44 PM  Between 7am to 7pm - Pager - [phone]  After 7pm go to [email or web address] - password [facility]  [facility] -  Office  [phone]

## 2018-02-25 DIAGNOSIS — I482 Chronic atrial fibrillation, unspecified: Secondary | ICD-10-CM | POA: Diagnosis not present

## 2018-02-25 DIAGNOSIS — R55 Syncope and collapse: Secondary | ICD-10-CM | POA: Diagnosis not present

## 2018-02-25 NOTE — Progress Notes (Signed)
PROGRESS NOTE                                                                                                                                                                                                             Patient Demographics:    [name], is a 83 y.o. female, DOB - 1935/01/22, WCH:[id number]  Admit date - 02/21/2018   Admitting Physician Courage Denton Brick, MD  Outpatient Primary MD for the patient is Crist Infante, MD  LOS - 0  Outpatient Specialists: None  Chief Complaint  Patient presents with  . Fall       Brief Narrative   83 year old female with history of CAD, severe dementia with cognitive deficits, hypertension, chronic A. fib on Eliquis presented to the ED from home with recurrent falls and confusion.  Plan for discharge but had another fall on 12/27.  Admitted for syncopal work-up.    Subjective:   Stable on heart monitor.  No further tachycardia.   Assessment  & Plan :    Principal Problem:   Syncope, vasovagal versus orthostatic Stable on telemetry except for occasional tachycardia to 120 this morning.  2D echo with normal EF of 60-65%, severely dilated left atrium PA pressure of 47 mmHg.  Carotid Doppler negative for significant stenosis.  Head CT on presentation without acute findings.  PT recommends SNF. Received gentle hydration for orthostasis, currently negative. Continue TED hose. Discontinue cardiac monitor.   Active Problems: Permanent atrial fibrillation Continue Eliquis.  Continue metoprolol 100 mg daily.     Coronary artery disease Continue statin and beta-blocker.     Chronic diastolic heart failure (HCC) Euvolemic.  Continue beta-blocker.  Frequent falls Possibly secondary to orthostasis/vasovagal.  PT recommends SNF. Patient also has borderline prolonged QTC.  Trazodone held.  Monitor while on Cymbalta.  Hypokalemia Replenished  OSA Patient not using  nighttime CPAP.  Code Status : Full code  Family Communication  : None at bedside  Disposition Plan  : Awaiting insurance authorization for SNF.  Barriers For Discharge : Improving symptoms  Consults  : None  Procedures  : CT head, 2D echo, carotid Doppler  DVT Prophylaxis  : Eliquis  Lab Results  Component Value Date   PLT 167 02/22/2018    Antibiotics  :    Anti-infectives (From admission, onward)   Start  Dose/Rate Route Frequency Ordered Stop   02/21/18 1323  tobramycin (NEBCIN) 1.2 g powder  Status:  Discontinued    Note to Pharmacy:  Mervyn Skeeters   : cabinet override      02/21/18 1323 02/21/18 1327        Objective:   Vitals:   02/24/18 1928 02/25/18 0419 02/25/18 0932 02/25/18 1210  BP: (!) 141/71 (!) 123/94 (!) 157/93 (!) 142/78  Pulse: 84 88 71 63  Resp: 18 16 18 17   Temp: 99.3 F (37.4 C) 99 F (37.2 C)  (!) 97.5 F (36.4 C)  TempSrc: Oral Oral  Oral  SpO2: 95% 95% 95% 95%  Weight:  61.2 kg    Height:        Wt Readings from Last 3 Encounters:  02/25/18 61.2 kg  01/16/18 63.5 kg  12/29/17 60.8 kg     Intake/Output Summary (Last 24 hours) at 02/25/2018 1225 Last data filed at 02/25/2018 1140 Gross per 24 hour  Intake 1964.29 ml  Output 1601 ml  Net 363.29 ml    Physical exam Not in distress HEENT: Moist mucosa, supple neck Chest: Clear bilaterally CVS: S1-S2 regular GI: Soft, nondistended, nontender Musculoskeletal: Warm, no edema    Data Review:    CBC Recent Labs  Lab 02/21/18 1454 02/22/18 0426  WBC 7.3 6.1  HGB 12.2 11.7*  HCT 40.0 34.9*  PLT 129* 167  MCV 95.2 89.7  MCH 29.0 30.1  MCHC 30.5 33.5  RDW 12.5 12.5  LYMPHSABS 0.9  --   MONOABS 1.0  --   EOSABS 0.0  --   BASOSABS 0.0  --     Chemistries  Recent Labs  Lab 02/21/18 1341 02/21/18 1825 02/22/18 0426 02/24/18 0505  NA 138  --  136 136  K 2.7*  --  2.8* 3.1*  CL 101  --  100 100  CO2 25  --  23 25  GLUCOSE 107*  --  90 110*  BUN 11  --   7* 8  CREATININE 0.55  --  0.58 0.66  CALCIUM 9.5  --  8.6* 8.9  MG  --  1.7  --   --   AST 25  --   --   --   ALT 12  --   --   --   ALKPHOS 47  --   --   --   BILITOT 2.0*  --   --   --    ------------------------------------------------------------------------------------------------------------------ No results for input(s): CHOL, HDL, LDLCALC, TRIG, CHOLHDL, LDLDIRECT in the last 72 hours.  No results found for: HGBA1C ------------------------------------------------------------------------------------------------------------------ No results for input(s): TSH, T4TOTAL, T3FREE, THYROIDAB in the last 72 hours.  Invalid input(s): FREET3 ------------------------------------------------------------------------------------------------------------------ No results for input(s): VITAMINB12, FOLATE, FERRITIN, TIBC, IRON, RETICCTPCT in the last 72 hours.  Coagulation profile Recent Labs  Lab 02/21/18 1341  INR 1.09    No results for input(s): DDIMER in the last 72 hours.  Cardiac Enzymes Recent Labs  Lab 02/21/18 2012 02/22/18 0426  TROPONINI <0.03 <0.03   ------------------------------------------------------------------------------------------------------------------    Component Value Date/Time   BNP 401.1 (H) 06/24/2014 2127    Inpatient Medications  Scheduled Meds: . apixaban  2.5 mg Oral BID  . atorvastatin  20 mg Oral Daily  . DULoxetine  40 mg Oral QHS  . feeding supplement (ENSURE ENLIVE)  237 mL Oral BID BM  . galantamine  16 mg Oral Q breakfast  . magnesium oxide  400 mg Oral BID  .  metoprolol succinate  100 mg Oral Daily  . pantoprazole  40 mg Oral Daily  . potassium chloride  40 mEq Oral Once  . sodium chloride flush  3 mL Intravenous Q12H   Continuous Infusions: . sodium chloride Stopped (02/22/18 0046)  . sodium chloride 50 mL/hr at 02/25/18 0600   PRN Meds:.sodium chloride, acetaminophen **OR** acetaminophen, albuterol, bismuth subsalicylate,  calcium carbonate, hydrALAZINE, HYDROcodone-acetaminophen, nitroGLYCERIN, ondansetron **OR** ondansetron (ZOFRAN) IV, polyethylene glycol, sodium chloride flush  Micro Results No results found for this or any previous visit (from the past 240 hour(s)).  Radiology Reports Dg Chest 2 View  Result Date: 02/21/2018 CLINICAL DATA:  Initial evaluation for acute trauma, fall. EXAM: CHEST - 2 VIEW COMPARISON:  Prior radiograph from 04/27/2016. FINDINGS: Moderate cardiomegaly. Mediastinal silhouette normal. Aortic atherosclerosis. Lungs normally inflated. Mild diffuse pulmonary vascular and interstitial congestion without overt pulmonary edema. No focal infiltrates. No pleural effusion. No pneumothorax. No acute osseous abnormality. Diffuse osteopenia. IMPRESSION: 1. Cardiomegaly with mild diffuse pulmonary vascular and interstitial congestion without overt pulmonary edema. 2. Aortic atherosclerosis. Electronically Signed   By: Jeannine Boga M.D.   On: 02/21/2018 13:20   Dg Forearm Right  Result Date: 02/21/2018 CLINICAL DATA:  Initial evaluation for acute trauma, fall. EXAM: RIGHT FOREARM - 2 VIEW COMPARISON:  None. FINDINGS: Acute transverse fracture through the distal right radius with impaction and slight dorsal angulation. Additional fracture through the base of the ulnar styloid better seen on concomitant radiograph of the hand. No other acute fracture or dislocation about the forearm. Soft tissue swelling overlies the wrist. Mild degenerative changes noted about the elbow. Osteopenia. IMPRESSION: 1. Acute transverse fracture through the distal right radius with impaction and slight dorsal angulation. 2. Acute nondisplaced fracture through the base of the ulnar styloid. 3. No other acute traumatic injury about the forearm. Electronically Signed   By: Jeannine Boga M.D.   On: 02/21/2018 13:22   Ct Head Wo Contrast  Result Date: 02/21/2018 CLINICAL DATA:  [place] out of bed. On Xarelto.  History of dementia. Initial encounter. EXAM: CT HEAD WITHOUT CONTRAST CT CERVICAL SPINE WITHOUT CONTRAST TECHNIQUE: Multidetector CT imaging of the head and cervical spine was performed following the standard protocol without intravenous contrast. Multiplanar CT image reconstructions of the cervical spine were also generated. COMPARISON:  Brain MRI 09/13/2016 and cervical spine CT 08/31/2007 FINDINGS: CT HEAD FINDINGS Brain: There is no evidence of acute infarct, intracranial hemorrhage, mass, midline shift, or extra-axial fluid collection. Mild cerebral atrophy is within normal limits for age. Patchy cerebral white matter hypodensities are nonspecific but compatible with moderate chronic small vessel ischemic disease. Vascular: Calcified atherosclerosis at the skull base. No hyperdense vessel. Skull: No fracture or focal osseous lesion. Sinuses/Orbits: Minimal left sphenoid sinus mucosal thickening. Clear mastoid air cells. Bilateral cataract extraction. Other: None. CT CERVICAL SPINE FINDINGS Alignment: Unchanged trace anterolisthesis of C3 on C4 and C4 on C5. Skull base and vertebrae: No acute fracture or suspicious osseous lesion. Soft tissues and spinal canal: No prevertebral fluid or swelling. No visible canal hematoma. Disc levels: Similar appearance of mild cervical spondylosis. Mild-to-moderate multilevel facet arthrosis has progressed. Mild left neural foraminal stenosis at C5-6. No osseous spinal canal stenosis. Upper chest: Clear lung apices. Other: Mild calcific atherosclerosis involving the carotid arteries. IMPRESSION: 1. No evidence of acute intracranial abnormality. 2. Moderate chronic small vessel ischemic disease. 3. No evidence of acute fracture or traumatic subluxation in the cervical spine. Electronically Signed   By: Seymour Bars.D.  On: 02/21/2018 13:08   Ct Cervical Spine Wo Contrast  Result Date: 02/21/2018 CLINICAL DATA:  [place] out of bed. On Xarelto. History of dementia. Initial  encounter. EXAM: CT HEAD WITHOUT CONTRAST CT CERVICAL SPINE WITHOUT CONTRAST TECHNIQUE: Multidetector CT imaging of the head and cervical spine was performed following the standard protocol without intravenous contrast. Multiplanar CT image reconstructions of the cervical spine were also generated. COMPARISON:  Brain MRI 09/13/2016 and cervical spine CT 08/31/2007 FINDINGS: CT HEAD FINDINGS Brain: There is no evidence of acute infarct, intracranial hemorrhage, mass, midline shift, or extra-axial fluid collection. Mild cerebral atrophy is within normal limits for age. Patchy cerebral white matter hypodensities are nonspecific but compatible with moderate chronic small vessel ischemic disease. Vascular: Calcified atherosclerosis at the skull base. No hyperdense vessel. Skull: No fracture or focal osseous lesion. Sinuses/Orbits: Minimal left sphenoid sinus mucosal thickening. Clear mastoid air cells. Bilateral cataract extraction. Other: None. CT CERVICAL SPINE FINDINGS Alignment: Unchanged trace anterolisthesis of C3 on C4 and C4 on C5. Skull base and vertebrae: No acute fracture or suspicious osseous lesion. Soft tissues and spinal canal: No prevertebral fluid or swelling. No visible canal hematoma. Disc levels: Similar appearance of mild cervical spondylosis. Mild-to-moderate multilevel facet arthrosis has progressed. Mild left neural foraminal stenosis at C5-6. No osseous spinal canal stenosis. Upper chest: Clear lung apices. Other: Mild calcific atherosclerosis involving the carotid arteries. IMPRESSION: 1. No evidence of acute intracranial abnormality. 2. Moderate chronic small vessel ischemic disease. 3. No evidence of acute fracture or traumatic subluxation in the cervical spine. Electronically Signed   By: Logan Bores M.D.   On: 02/21/2018 13:08   Dg Hand Complete Right  Result Date: 02/21/2018 CLINICAL DATA:  Initial evaluation for acute trauma, fall. EXAM: RIGHT HAND - COMPLETE 3+ VIEW COMPARISON:   None. FINDINGS: Acute transverse fracture extending through the distal right radius with dorsal angulation, better evaluated on concomitant radiograph of the right wrist. Associated fracture through the base of the ulnar styloid noted as well. No other acute fracture or dislocation about the hand. Moderate to advanced osteoarthritic changes throughout the hand, most notable at the first [facility] joint and thumb. Diffuse osteopenia. Soft tissue swelling overlies the wrist. IMPRESSION: 1. Acute fractures of the distal right radius and ulnar styloid, better evaluated on concomitant radiograph of the right wrist. 2. No other acute fracture or dislocation about the hand. 3. Moderate to advanced degenerative osteoarthrosis throughout the hand, most notable at the first [facility] joint and thumb. Electronically Signed   By: Jeannine Boga M.D.   On: 02/21/2018 13:18   Vas US Carotid  Result Date: 02/23/2018 Carotid Arterial Duplex Study Indications:       Syncope and Frequent falls. Risk Factors:      Coronary artery disease. Other Factors:     Atrial fibrillation. Comparison Study:  No prior study on file Performing Technologist: Sharion Dove RVS  Examination Guidelines: A complete evaluation includes B-mode imaging, spectral Doppler, color Doppler, and power Doppler as needed of all accessible portions of each vessel. Bilateral testing is considered an integral part of a complete examination. Limited examinations for reoccurring indications may be performed as noted.  Right Carotid Findings: +----------+--------+--------+--------+------------+------------------+           PSV cm/sEDV cm/sStenosisDescribe    Comments           +----------+--------+--------+--------+------------+------------------+ CCA Prox  79      17  intimal thickening +----------+--------+--------+--------+------------+------------------+ CCA Distal74      16                          intimal thickening  +----------+--------+--------+--------+------------+------------------+ ICA Prox  64      16              heterogenous                   +----------+--------+--------+--------+------------+------------------+ ICA Distal63      19                                             +----------+--------+--------+--------+------------+------------------+ ECA       96      9                                              +----------+--------+--------+--------+------------+------------------+ +----------+--------+-------+--------+-------------------+           PSV cm/sEDV cmsDescribeArm Pressure (mmHG) +----------+--------+-------+--------+-------------------+ [id number]                                         +----------+--------+-------+--------+-------------------+ +---------+--------+--+--------+--+ VertebralPSV cm/s49EDV cm/s11 +---------+--------+--+--------+--+  Left Carotid Findings: +----------+--------+--------+--------+------------+------------------+           PSV cm/sEDV cm/sStenosisDescribe    Comments           +----------+--------+--------+--------+------------+------------------+ CCA Prox  98      15                          intimal thickening +----------+--------+--------+--------+------------+------------------+ CCA Distal75      14                          intimal thickening +----------+--------+--------+--------+------------+------------------+ ICA Prox  58      13              heterogenous                   +----------+--------+--------+--------+------------+------------------+ ICA Distal59      16                                             +----------+--------+--------+--------+------------+------------------+ ECA       86      11                                             +----------+--------+--------+--------+------------+------------------+ +----------+--------+--------+--------+-------------------+ SubclavianPSV  cm/sEDV cm/sDescribeArm Pressure (mmHG) +----------+--------+--------+--------+-------------------+           172                                         +----------+--------+--------+--------+-------------------+ +---------+--------+--+--------+--+ VertebralPSV cm/s49EDV cm/s10 +---------+--------+--+--------+--+  Summary: Right Carotid: The extracranial vessels were near-normal with only minimal wall  thickening or plaque. Vertebrals:  Bilateral vertebral arteries demonstrate antegrade flow. Subclavians: Normal flow hemodynamics were seen in bilateral subclavian              arteries. *See table(s) above for measurements and observations.  Electronically signed by Curt Jews MD on 02/23/2018 at 9:05:19 AM.    Final     Time Spent in minutes  20   Offie Waide M.D on 02/25/2018 at 12:25 PM  Between 7am to 7pm - Pager - [phone]  After 7pm go to [email or web address] - password [facility]  [facility] -  Office  [phone]

## 2018-02-25 NOTE — Clinical Social Work Note (Addendum)
[place] does not have any beds. No supports at bedside. CSW called patient's relative, Mr. Moening and read off bed offers/CMS scores. He will review list and call CSW back with decision.  Dayton Scrape, [place]  12:08 pm Received call back from Mr. Vonruden. First preference is [facility], second preference is [facility]. [facility] does not have any beds right now. They may have something open up Thursday but not guaranteed. [facility] has beds available and will start insurance authorization. Mr. Bonillas aware and agreeable.  Dayton Scrape, [place]

## 2018-02-26 DIAGNOSIS — S52501A Unspecified fracture of the lower end of right radius, initial encounter for closed fracture: Secondary | ICD-10-CM | POA: Diagnosis not present

## 2018-02-26 DIAGNOSIS — Z7901 Long term (current) use of anticoagulants: Secondary | ICD-10-CM | POA: Diagnosis not present

## 2018-02-26 DIAGNOSIS — R55 Syncope and collapse: Secondary | ICD-10-CM | POA: Diagnosis not present

## 2018-02-26 NOTE — Progress Notes (Signed)
PROGRESS NOTE                                                                                                                                                                                                             Patient Demographics:    [name], is a 84 y.o. female, DOB - 10/13/1934, PJK:[id number]  Admit date - 02/21/2018   Admitting Physician Courage Denton Brick, MD  Outpatient Primary MD for the patient is Crist Infante, MD  LOS - 0  Outpatient Specialists: None  Chief Complaint  Patient presents with  . Fall       Brief Narrative   84 year old female with history of CAD, severe dementia with cognitive deficits, hypertension, chronic A. fib on Eliquis presented to the ED from home with recurrent falls and confusion.  Plan for discharge but had another fall on 12/27.  Sustained fracture of her right radius.  Admitted for syncopal work-up.    Subjective:   Stable on heart monitor.  No further tachycardia.   Assessment  & Plan :    Principal Problem:   Syncope, vasovagal versus orthostatic Stable on telemetry except for occasional tachycardia to 120 this morning.  2D echo with normal EF of 60-65%, severely dilated left atrium PA pressure of 47 mmHg.  Carotid Doppler negative for significant stenosis.  Head CT on presentation without acute findings.  PT recommends SNF. Received gentle hydration for orthostasis, currently negative. Continue TED hose.    Active Problems: Permanent atrial fibrillation Continue Eliquis.  Continue metoprolol 100 mg daily.  Right wrist fracture On presentation.  Close reduction done in the ED.  Dr. Amedeo Plenty recommends to have a follow-up in the office in about 1 week.    Coronary artery disease Continue statin and beta-blocker.     Chronic diastolic heart failure (HCC) Euvolemic.  Continue beta-blocker.  Frequent falls Possibly secondary to orthostasis/vasovagal.  PT  recommends SNF. Patient also has borderline prolonged QTC.  Trazodone held.  Monitor while on Cymbalta.  Hypokalemia Replenished  OSA Patient not using nighttime CPAP.  Code Status : Full code  Family Communication  : None at bedside  Disposition Plan  : Awaiting insurance authorization for SNF.  Barriers For Discharge : Awaiting SNF.  Consults  : None  Procedures  : CT head, 2D echo, carotid Doppler  DVT Prophylaxis  :  Eliquis  Lab Results  Component Value Date   PLT 167 02/22/2018    Antibiotics  :    Anti-infectives (From admission, onward)   Start     Dose/Rate Route Frequency Ordered Stop   02/21/18 1323  tobramycin (NEBCIN) 1.2 g powder  Status:  Discontinued    Note to Pharmacy:  Mervyn Skeeters   : cabinet override      02/21/18 1323 02/21/18 1327        Objective:   Vitals:   02/25/18 1920 02/26/18 0549 02/26/18 0554 02/26/18 1007  BP: (!) 162/82  (!) 165/87 (!) 156/78  Pulse: 80  81 84  Resp: 18  18   Temp: 98.2 F (36.8 C)  97.8 F (36.6 C)   TempSrc: Oral  Oral   SpO2: 97%  96% 93%  Weight:  56.7 kg    Height:        Wt Readings from Last 3 Encounters:  02/26/18 56.7 kg  01/16/18 63.5 kg  12/29/17 60.8 kg     Intake/Output Summary (Last 24 hours) at 02/26/2018 1149 Last data filed at 02/26/2018 1010 Gross per 24 hour  Intake 58.61 ml  Output 2050 ml  Net -1991.39 ml   Physical exam Not in distress HEENT: Moist mucosa, supple neck Chest: Clear bilaterally CVs: S1-S2 regular, no murmurs GI: Soft, nondistended, nontender Musculoskeletal: Right forearm cast, no edema    Data Review:    CBC Recent Labs  Lab 02/21/18 1454 02/22/18 0426  WBC 7.3 6.1  HGB 12.2 11.7*  HCT 40.0 34.9*  PLT 129* 167  MCV 95.2 89.7  MCH 29.0 30.1  MCHC 30.5 33.5  RDW 12.5 12.5  LYMPHSABS 0.9  --   MONOABS 1.0  --   EOSABS 0.0  --   BASOSABS 0.0  --     Chemistries  Recent Labs  Lab 02/21/18 1341 02/21/18 1825 02/22/18 0426  02/24/18 0505  NA 138  --  136 136  K 2.7*  --  2.8* 3.1*  CL 101  --  100 100  CO2 25  --  23 25  GLUCOSE 107*  --  90 110*  BUN 11  --  7* 8  CREATININE 0.55  --  0.58 0.66  CALCIUM 9.5  --  8.6* 8.9  MG  --  1.7  --   --   AST 25  --   --   --   ALT 12  --   --   --   ALKPHOS 47  --   --   --   BILITOT 2.0*  --   --   --    ------------------------------------------------------------------------------------------------------------------ No results for input(s): CHOL, HDL, LDLCALC, TRIG, CHOLHDL, LDLDIRECT in the last 72 hours.  No results found for: HGBA1C ------------------------------------------------------------------------------------------------------------------ No results for input(s): TSH, T4TOTAL, T3FREE, THYROIDAB in the last 72 hours.  Invalid input(s): FREET3 ------------------------------------------------------------------------------------------------------------------ No results for input(s): VITAMINB12, FOLATE, FERRITIN, TIBC, IRON, RETICCTPCT in the last 72 hours.  Coagulation profile Recent Labs  Lab 02/21/18 1341  INR 1.09    No results for input(s): DDIMER in the last 72 hours.  Cardiac Enzymes Recent Labs  Lab 02/21/18 2012 02/22/18 0426  TROPONINI <0.03 <0.03   ------------------------------------------------------------------------------------------------------------------    Component Value Date/Time   BNP 401.1 (H) 06/24/2014 2127    Inpatient Medications  Scheduled Meds: . apixaban  2.5 mg Oral BID  . atorvastatin  20 mg Oral Daily  . DULoxetine  40 mg Oral QHS  .  feeding supplement (ENSURE ENLIVE)  237 mL Oral BID BM  . galantamine  16 mg Oral Q breakfast  . magnesium oxide  400 mg Oral BID  . metoprolol succinate  100 mg Oral Daily  . pantoprazole  40 mg Oral Daily  . potassium chloride  40 mEq Oral Once  . sodium chloride flush  3 mL Intravenous Q12H   Continuous Infusions: . sodium chloride Stopped (02/22/18 0046)    PRN Meds:.sodium chloride, acetaminophen **OR** acetaminophen, albuterol, bismuth subsalicylate, calcium carbonate, hydrALAZINE, HYDROcodone-acetaminophen, nitroGLYCERIN, ondansetron **OR** ondansetron (ZOFRAN) IV, polyethylene glycol, sodium chloride flush  Micro Results No results found for this or any previous visit (from the past 240 hour(s)).  Radiology Reports Dg Chest 2 View  Result Date: 02/21/2018 CLINICAL DATA:  Initial evaluation for acute trauma, fall. EXAM: CHEST - 2 VIEW COMPARISON:  Prior radiograph from 04/27/2016. FINDINGS: Moderate cardiomegaly. Mediastinal silhouette normal. Aortic atherosclerosis. Lungs normally inflated. Mild diffuse pulmonary vascular and interstitial congestion without overt pulmonary edema. No focal infiltrates. No pleural effusion. No pneumothorax. No acute osseous abnormality. Diffuse osteopenia. IMPRESSION: 1. Cardiomegaly with mild diffuse pulmonary vascular and interstitial congestion without overt pulmonary edema. 2. Aortic atherosclerosis. Electronically Signed   By: Jeannine Boga M.D.   On: 02/21/2018 13:20   Dg Forearm Right  Result Date: 02/21/2018 CLINICAL DATA:  Initial evaluation for acute trauma, fall. EXAM: RIGHT FOREARM - 2 VIEW COMPARISON:  None. FINDINGS: Acute transverse fracture through the distal right radius with impaction and slight dorsal angulation. Additional fracture through the base of the ulnar styloid better seen on concomitant radiograph of the hand. No other acute fracture or dislocation about the forearm. Soft tissue swelling overlies the wrist. Mild degenerative changes noted about the elbow. Osteopenia. IMPRESSION: 1. Acute transverse fracture through the distal right radius with impaction and slight dorsal angulation. 2. Acute nondisplaced fracture through the base of the ulnar styloid. 3. No other acute traumatic injury about the forearm. Electronically Signed   By: Jeannine Boga M.D.   On: 02/21/2018 13:22    Ct Head Wo Contrast  Result Date: 02/21/2018 CLINICAL DATA:  [place] out of bed. On Xarelto. History of dementia. Initial encounter. EXAM: CT HEAD WITHOUT CONTRAST CT CERVICAL SPINE WITHOUT CONTRAST TECHNIQUE: Multidetector CT imaging of the head and cervical spine was performed following the standard protocol without intravenous contrast. Multiplanar CT image reconstructions of the cervical spine were also generated. COMPARISON:  Brain MRI 09/13/2016 and cervical spine CT 08/31/2007 FINDINGS: CT HEAD FINDINGS Brain: There is no evidence of acute infarct, intracranial hemorrhage, mass, midline shift, or extra-axial fluid collection. Mild cerebral atrophy is within normal limits for age. Patchy cerebral white matter hypodensities are nonspecific but compatible with moderate chronic small vessel ischemic disease. Vascular: Calcified atherosclerosis at the skull base. No hyperdense vessel. Skull: No fracture or focal osseous lesion. Sinuses/Orbits: Minimal left sphenoid sinus mucosal thickening. Clear mastoid air cells. Bilateral cataract extraction. Other: None. CT CERVICAL SPINE FINDINGS Alignment: Unchanged trace anterolisthesis of C3 on C4 and C4 on C5. Skull base and vertebrae: No acute fracture or suspicious osseous lesion. Soft tissues and spinal canal: No prevertebral fluid or swelling. No visible canal hematoma. Disc levels: Similar appearance of mild cervical spondylosis. Mild-to-moderate multilevel facet arthrosis has progressed. Mild left neural foraminal stenosis at C5-6. No osseous spinal canal stenosis. Upper chest: Clear lung apices. Other: Mild calcific atherosclerosis involving the carotid arteries. IMPRESSION: 1. No evidence of acute intracranial abnormality. 2. Moderate chronic small vessel ischemic disease. 3. No  evidence of acute fracture or traumatic subluxation in the cervical spine. Electronically Signed   By: Logan Bores M.D.   On: 02/21/2018 13:08   Ct Cervical Spine Wo  Contrast  Result Date: 02/21/2018 CLINICAL DATA:  [place] out of bed. On Xarelto. History of dementia. Initial encounter. EXAM: CT HEAD WITHOUT CONTRAST CT CERVICAL SPINE WITHOUT CONTRAST TECHNIQUE: Multidetector CT imaging of the head and cervical spine was performed following the standard protocol without intravenous contrast. Multiplanar CT image reconstructions of the cervical spine were also generated. COMPARISON:  Brain MRI 09/13/2016 and cervical spine CT 08/31/2007 FINDINGS: CT HEAD FINDINGS Brain: There is no evidence of acute infarct, intracranial hemorrhage, mass, midline shift, or extra-axial fluid collection. Mild cerebral atrophy is within normal limits for age. Patchy cerebral white matter hypodensities are nonspecific but compatible with moderate chronic small vessel ischemic disease. Vascular: Calcified atherosclerosis at the skull base. No hyperdense vessel. Skull: No fracture or focal osseous lesion. Sinuses/Orbits: Minimal left sphenoid sinus mucosal thickening. Clear mastoid air cells. Bilateral cataract extraction. Other: None. CT CERVICAL SPINE FINDINGS Alignment: Unchanged trace anterolisthesis of C3 on C4 and C4 on C5. Skull base and vertebrae: No acute fracture or suspicious osseous lesion. Soft tissues and spinal canal: No prevertebral fluid or swelling. No visible canal hematoma. Disc levels: Similar appearance of mild cervical spondylosis. Mild-to-moderate multilevel facet arthrosis has progressed. Mild left neural foraminal stenosis at C5-6. No osseous spinal canal stenosis. Upper chest: Clear lung apices. Other: Mild calcific atherosclerosis involving the carotid arteries. IMPRESSION: 1. No evidence of acute intracranial abnormality. 2. Moderate chronic small vessel ischemic disease. 3. No evidence of acute fracture or traumatic subluxation in the cervical spine. Electronically Signed   By: Logan Bores M.D.   On: 02/21/2018 13:08   Dg Hand Complete Right  Result Date:  02/21/2018 CLINICAL DATA:  Initial evaluation for acute trauma, fall. EXAM: RIGHT HAND - COMPLETE 3+ VIEW COMPARISON:  None. FINDINGS: Acute transverse fracture extending through the distal right radius with dorsal angulation, better evaluated on concomitant radiograph of the right wrist. Associated fracture through the base of the ulnar styloid noted as well. No other acute fracture or dislocation about the hand. Moderate to advanced osteoarthritic changes throughout the hand, most notable at the first [facility] joint and thumb. Diffuse osteopenia. Soft tissue swelling overlies the wrist. IMPRESSION: 1. Acute fractures of the distal right radius and ulnar styloid, better evaluated on concomitant radiograph of the right wrist. 2. No other acute fracture or dislocation about the hand. 3. 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 Electronically Signed   By: Jeannine Boga M.D.   On: 02/21/2018 13:18   Vas US Carotid  Result Date: 02/23/2018 Carotid Arterial Duplex Study Indications:       Syncope and Frequent falls. Risk Factors:      Coronary artery disease. Other Factors:     Atrial fibrillation. Comparison Study:  No prior study on file Performing Technologist: Sharion Dove RVS  Examination Guidelines: A complete evaluation includes B-mode imaging, spectral Doppler, color Doppler, and power Doppler as needed of all accessible portions of each vessel. Bilateral testing is considered an integral part of a complete examination. Limited examinations for reoccurring indications may be performed as noted.  Right Carotid Findings: +----------+--------+--------+--------+------------+------------------+           PSV cm/sEDV cm/sStenosisDescribe    Comments           +----------+--------+--------+--------+------------+------------------+ CCA Prox  79      17                          intimal thickening  +----------+--------+--------+--------+------------+------------------+ CCA Distal74      16                          intimal thickening +----------+--------+--------+--------+------------+------------------+ ICA Prox  64      16              heterogenous                   +----------+--------+--------+--------+------------+------------------+ ICA Distal63      19                                             +----------+--------+--------+--------+------------+------------------+ ECA       96      9                                              +----------+--------+--------+--------+------------+------------------+ +----------+--------+-------+--------+-------------------+           PSV cm/sEDV cmsDescribeArm Pressure (mmHG) +----------+--------+-------+--------+-------------------+ [id number]                                         +----------+--------+-------+--------+-------------------+ +---------+--------+--+--------+--+ VertebralPSV cm/s49EDV cm/s11 +---------+--------+--+--------+--+  Left Carotid Findings: +----------+--------+--------+--------+------------+------------------+           PSV cm/sEDV cm/sStenosisDescribe    Comments           +----------+--------+--------+--------+------------+------------------+ CCA Prox  98      15                          intimal thickening +----------+--------+--------+--------+------------+------------------+ CCA Distal75      14                          intimal thickening +----------+--------+--------+--------+------------+------------------+ ICA Prox  58      13              heterogenous                   +----------+--------+--------+--------+------------+------------------+ ICA Distal59      16                                             +----------+--------+--------+--------+------------+------------------+ ECA       86      11                                              +----------+--------+--------+--------+------------+------------------+ +----------+--------+--------+--------+-------------------+ SubclavianPSV cm/sEDV cm/sDescribeArm Pressure (mmHG) +----------+--------+--------+--------+-------------------+           172                                         +----------+--------+--------+--------+-------------------+ +---------+--------+--+--------+--+  VertebralPSV cm/s49EDV cm/s10 +---------+--------+--+--------+--+  Summary: Right Carotid: The extracranial vessels were near-normal with only minimal wall                thickening or plaque. Vertebrals:  Bilateral vertebral arteries demonstrate antegrade flow. Subclavians: Normal flow hemodynamics were seen in bilateral subclavian              arteries. *See table(s) above for measurements and observations.  Electronically signed by Curt Jews MD on 02/23/2018 at 9:05:19 AM.    Final     Time Spent in minutes  20   Zia Najera M.D on 02/26/2018 at 11:49 AM  Between 7am to 7pm - Pager - [phone]  After 7pm go to [email or web address] - password [facility]  [facility] -  Office  [phone]

## 2018-02-26 NOTE — Care Management Obs Status (Signed)
[facility] NOTIFICATION   Patient Details  Name: [name] MRN: [id number] Date of Birth: Aug 28, 1934   Medicare Observation Status Notification Given:  (pt refused to sign letter; letter explained in detail)    Royston Bake, RN 02/26/2018, 10:23 AM

## 2018-02-26 NOTE — Progress Notes (Signed)
Patient ID: [name], female   DOB: Aug 04, 1934, 84 y.o.   MRN: [id number] Patient was seen at bedside.  Her wrist is stable.  She status post closed reduction right distal radius fracture.  Given her age level of function and multiple medical morbidities etc. I do feel close treatment is the best option at this juncture.  The cast is stable and in place and at present time we will simply wait watch and see how she does into the future.  I would not see her in a week and 1/2 to 2 weeks for repeat x-rays in my office.  I discussed this with her at bedside.  Norena Bratton MD

## 2018-02-26 NOTE — Progress Notes (Signed)
Patient declined CPAP at this time, no distress noted.  

## 2018-02-27 DIAGNOSIS — S52501A Unspecified fracture of the lower end of right radius, initial encounter for closed fracture: Secondary | ICD-10-CM | POA: Diagnosis not present

## 2018-02-27 NOTE — Clinical Social Work Note (Addendum)
Insurance authorization still pending.  Dayton Scrape, CSW [phone]  1:44 pm Authorization still pending.  Dayton Scrape, [place]  4:11 pm [facility] Medicare was closed yesterday so they are backed up on authorizations. This patient's clinicals are also being reviewed by their medical director.  Dayton Scrape, [place]

## 2018-02-27 NOTE — Progress Notes (Signed)
Physical Therapy Treatment Patient Details Name: [name] MRN: [id number] DOB: May 10, 1934 Today's Date: 02/27/2018    History of Present Illness [name]  is a 84 y.o. female, past medical history relevant for CAD, dementia with cognitive deficits, hypertension and chronic atrial fibrillation on Eliquis for anticoagulation presented to the ED with concerns about recurrent falls and confusion CT head is negative for acute findings, right upper extremity x-rays confirmed right distal radius fracture with styloid process fracture.    PT Comments    Pt admitted with above diagnosis. Pt currently with functional limitations due to balance and endurance deficits. Pt was able to ambulate in hallway with decr stability with HHA on left.  Pt confusion limits ability to follow safety cues.  Needed min assist for safety.  Will continue to follow acutely.   Pt will benefit from skilled PT to increase their independence and safety with mobility to allow discharge to the venue listed below.     Follow Up Recommendations  SNF;Supervision/Assistance - 24 hour(memory care unit at [facility])     Equipment Recommendations  None recommended by PT    Recommendations for Other Services       Precautions / Restrictions Precautions Precautions: Fall Precaution Comments: long arm cast RUE Required Braces or Orthoses: Sling Restrictions Weight Bearing Restrictions: No RUE Weight Bearing: Non weight bearing Other Position/Activity Restrictions: RUE sling, no wear schedule specified    Mobility  Bed Mobility Overal bed mobility: Needs Assistance Bed Mobility: Supine to Sit     Supine to sit: Min assist;HOB elevated     General bed mobility comments: assist to elevate trunk and scoot to EOB  Transfers Overall transfer level: Needs assistance Equipment used: 1 person hand held assist Transfers: Sit to/from Stand Sit to Stand: Min assist Stand pivot transfers: Min assist       General  transfer comment: assist to power up and stabilize balance  Ambulation/Gait Ambulation/Gait assistance: Min assist Gait Distance (Feet): 180 Feet Assistive device: 1 person hand held assist Gait Pattern/deviations: Step-through pattern;Decreased stride length;Drifts right/left;Staggering left;Staggering right Gait velocity: decreased Gait velocity interpretation: 1.31 - 2.62 ft/sec, indicative of limited community ambulator General Gait Details: unsteady gait, HHA on L with sling in place RUE   Stairs             Wheelchair Mobility    Modified Rankin (Stroke Patients Only)       Balance Overall balance assessment: Needs assistance Sitting-balance support: No upper extremity supported;Feet supported Sitting balance-Leahy Scale: Good     Standing balance support: Single extremity supported;During functional activity Standing balance-Leahy Scale: Poor Standing balance comment: reliant on external support                            Cognition Arousal/Alertness: Awake/alert Behavior During Therapy: WFL for tasks assessed/performed Overall Cognitive Status: No family/caregiver present to determine baseline cognitive functioning Area of Impairment: Orientation;Memory;Safety/judgement;Problem solving                 Orientation Level: Disoriented to;Time   Memory: Decreased short-term memory   Safety/Judgement: Decreased awareness of safety;Decreased awareness of deficits   Problem Solving: Slow processing;Difficulty sequencing;Requires verbal cues General Comments: progressive coginitive decline over last several months per chart decreased awareness of deficits and unable to recall how many times she's fallen in the past...      Exercises Other Exercises Other Exercises: flexion/entension of fingers outside of cast    General  Comments        Pertinent Vitals/Pain Pain Assessment: No/denies pain    Home Living                       Prior Function            PT Goals (current goals can now be found in the care plan section) Acute Rehab PT Goals Patient Stated Goal: return to her apartment Progress towards PT goals: Progressing toward goals    Frequency    Min 2X/week      PT Plan Current plan remains appropriate;Frequency needs to be updated    Co-evaluation              AM-PAC PT "6 Clicks" Mobility   Outcome Measure  Help needed turning from your back to your side while in a flat bed without using bedrails?: None Help needed moving from lying on your back to sitting on the side of a flat bed without using bedrails?: A Little Help needed moving to and from a bed to a chair (including a wheelchair)?: A Little Help needed standing up from a chair using your arms (e.g., wheelchair or bedside chair)?: A Little Help needed to walk in hospital room?: A Lot Help needed climbing 3-5 steps with a railing? : A Lot 6 Click Score: 17    End of Session Equipment Utilized During Treatment: Gait belt Activity Tolerance: Patient tolerated treatment well Patient left: in bed;with call bell/phone within reach;with bed alarm set Nurse Communication: Mobility status PT Visit Diagnosis: Unsteadiness on feet (R26.81);Repeated falls (R29.6);Difficulty in walking, not elsewhere classified (R26.2)     Time: 5102-5852 PT Time Calculation (min) (ACUTE ONLY): 12 min  Charges:  $Gait Training: 8-22 mins                     [place] Pager:  [phone]  Office:  [place] 02/27/2018, 3:55 PM

## 2018-02-27 NOTE — Progress Notes (Signed)
PROGRESS NOTE                                                                                                                                                                                                             Patient Demographics:    [name], is a 84 y.o. female, DOB - 1935-01-07, CWU:[id number]  Admit date - 02/21/2018   Admitting Physician Courage Denton Brick, MD  Outpatient Primary MD for the patient is Crist Infante, MD  LOS - 0  Outpatient Specialists: None  Chief Complaint  Patient presents with  . Fall       Brief Narrative   84 year old female with history of CAD, severe dementia with cognitive deficits, hypertension, chronic A. fib on Eliquis presented to the ED from home with recurrent falls and confusion.  Plan for discharge but had another fall on 12/27.  Sustained fracture of her right radius.  Admitted for syncopal work-up.    Subjective:   Stable on heart monitor.  No further tachycardia.   Assessment  & Plan :    Principal Problem:   Syncope, vasovagal versus orthostatic  2D echo with normal EF of 60-65%, severely dilated left atrium PA pressure of 47 mmHg.  Carotid Doppler negative for significant stenosis.  Head CT on presentation without acute findings.  PT recommends SNF. Orthostasis resolved with hydration.  Off telemetry. Continue TED hose.    Active Problems: Permanent atrial fibrillation Continue Eliquis.  Continue metoprolol 100 mg daily.  Right wrist fracture On presentation.  Close reduction done in the ED.  Dr. Amedeo Plenty saw her again on 1/1 and recommends outpatient follow-up in 1-2 weeks.    Coronary artery disease Continue statin and beta-blocker.     Chronic diastolic heart failure (HCC) Euvolemic.  Continue beta-blocker.  Frequent falls Possibly secondary to orthostasis/vasovagal.  PT recommends SNF. Patient also has borderline prolonged QTC.  Trazodone held.   Monitor while on Cymbalta.  Hypokalemia Replenished  OSA Patient not using nighttime CPAP.  Code Status : Full code  Family Communication  : None at bedside  Disposition Plan  : Awaiting insurance authorization for SNF.  Barriers For Discharge : Awaiting SNF.  Consults  : None  Procedures  : CT head, 2D echo, carotid Doppler  DVT Prophylaxis  : Eliquis  Lab Results  Component Value Date   PLT  167 02/22/2018    Antibiotics  :    Anti-infectives (From admission, onward)   Start     Dose/Rate Route Frequency Ordered Stop   02/21/18 1323  tobramycin (NEBCIN) 1.2 g powder  Status:  Discontinued    Note to Pharmacy:  Mervyn Skeeters   : cabinet override      02/21/18 1323 02/21/18 1327        Objective:   Vitals:   02/26/18 2025 02/27/18 0328 02/27/18 0842 02/27/18 1225  BP: (!) 156/81 (!) 167/90 (!) 156/81 (!) 163/86  Pulse: 83 76 84 76  Resp: 18 18  17   Temp: 98.3 F (36.8 C) 98.2 F (36.8 C)  98.7 F (37.1 C)  TempSrc: Oral Oral  Tympanic  SpO2: 95% 96%  93%  Weight:      Height:        Wt Readings from Last 3 Encounters:  02/26/18 56.7 kg  01/16/18 63.5 kg  12/29/17 60.8 kg     Intake/Output Summary (Last 24 hours) at 02/27/2018 1316 Last data filed at 02/27/2018 0900 Gross per 24 hour  Intake 126 ml  Output 250 ml  Net -124 ml   Physical exam Not in distress HEENT: Moist mucosa, supple neck Chest: Clear bilaterally CVs: S1-S2 regular, no murmurs GI: Soft, nondistended, nontender Musculoskeletal: Right forearm cast, no edema    Data Review:    CBC Recent Labs  Lab 02/21/18 1454 02/22/18 0426  WBC 7.3 6.1  HGB 12.2 11.7*  HCT 40.0 34.9*  PLT 129* 167  MCV 95.2 89.7  MCH 29.0 30.1  MCHC 30.5 33.5  RDW 12.5 12.5  LYMPHSABS 0.9  --   MONOABS 1.0  --   EOSABS 0.0  --   BASOSABS 0.0  --     Chemistries  Recent Labs  Lab 02/21/18 1341 02/21/18 1825 02/22/18 0426 02/24/18 0505  NA 138  --  136 136  K 2.7*  --  2.8* 3.1*  CL  101  --  100 100  CO2 25  --  23 25  GLUCOSE 107*  --  90 110*  BUN 11  --  7* 8  CREATININE 0.55  --  0.58 0.66  CALCIUM 9.5  --  8.6* 8.9  MG  --  1.7  --   --   AST 25  --   --   --   ALT 12  --   --   --   ALKPHOS 47  --   --   --   BILITOT 2.0*  --   --   --    ------------------------------------------------------------------------------------------------------------------ No results for input(s): CHOL, HDL, LDLCALC, TRIG, CHOLHDL, LDLDIRECT in the last 72 hours.  No results found for: HGBA1C ------------------------------------------------------------------------------------------------------------------ No results for input(s): TSH, T4TOTAL, T3FREE, THYROIDAB in the last 72 hours.  Invalid input(s): FREET3 ------------------------------------------------------------------------------------------------------------------ No results for input(s): VITAMINB12, FOLATE, FERRITIN, TIBC, IRON, RETICCTPCT in the last 72 hours.  Coagulation profile Recent Labs  Lab 02/21/18 1341  INR 1.09    No results for input(s): DDIMER in the last 72 hours.  Cardiac Enzymes Recent Labs  Lab 02/21/18 2012 02/22/18 0426  TROPONINI <0.03 <0.03   ------------------------------------------------------------------------------------------------------------------    Component Value Date/Time   BNP 401.1 (H) 06/24/2014 2127    Inpatient Medications  Scheduled Meds: . apixaban  2.5 mg Oral BID  . atorvastatin  20 mg Oral Daily  . DULoxetine  40 mg Oral QHS  . feeding supplement (ENSURE ENLIVE)  237 mL  Oral BID BM  . galantamine  16 mg Oral Q breakfast  . magnesium oxide  400 mg Oral BID  . metoprolol succinate  100 mg Oral Daily  . pantoprazole  40 mg Oral Daily  . potassium chloride  40 mEq Oral Once  . sodium chloride flush  3 mL Intravenous Q12H   Continuous Infusions: . sodium chloride Stopped (02/22/18 0046)   PRN Meds:.sodium chloride, acetaminophen **OR** acetaminophen,  albuterol, bismuth subsalicylate, calcium carbonate, hydrALAZINE, HYDROcodone-acetaminophen, nitroGLYCERIN, ondansetron **OR** ondansetron (ZOFRAN) IV, polyethylene glycol, sodium chloride flush  Micro Results No results found for this or any previous visit (from the past 240 hour(s)).  Radiology Reports Dg Chest 2 View  Result Date: 02/21/2018 CLINICAL DATA:  Initial evaluation for acute trauma, fall. EXAM: CHEST - 2 VIEW COMPARISON:  Prior radiograph from 04/27/2016. FINDINGS: Moderate cardiomegaly. Mediastinal silhouette normal. Aortic atherosclerosis. Lungs normally inflated. Mild diffuse pulmonary vascular and interstitial congestion without overt pulmonary edema. No focal infiltrates. No pleural effusion. No pneumothorax. No acute osseous abnormality. Diffuse osteopenia. IMPRESSION: 1. Cardiomegaly with mild diffuse pulmonary vascular and interstitial congestion without overt pulmonary edema. 2. Aortic atherosclerosis. Electronically Signed   By: Jeannine Boga M.D.   On: 02/21/2018 13:20   Dg Forearm Right  Result Date: 02/21/2018 CLINICAL DATA:  Initial evaluation for acute trauma, fall. EXAM: RIGHT FOREARM - 2 VIEW COMPARISON:  None. FINDINGS: Acute transverse fracture through the distal right radius with impaction and slight dorsal angulation. Additional fracture through the base of the ulnar styloid better seen on concomitant radiograph of the hand. No other acute fracture or dislocation about the forearm. Soft tissue swelling overlies the wrist. Mild degenerative changes noted about the elbow. Osteopenia. IMPRESSION: 1. Acute transverse fracture through the distal right radius with impaction and slight dorsal angulation. 2. Acute nondisplaced fracture through the base of the ulnar styloid. 3. No other acute traumatic injury about the forearm. Electronically Signed   By: Jeannine Boga M.D.   On: 02/21/2018 13:22   Ct Head Wo Contrast  Result Date: 02/21/2018 CLINICAL DATA:   [place] out of bed. On Xarelto. History of dementia. Initial encounter. EXAM: CT HEAD WITHOUT CONTRAST CT CERVICAL SPINE WITHOUT CONTRAST TECHNIQUE: Multidetector CT imaging of the head and cervical spine was performed following the standard protocol without intravenous contrast. Multiplanar CT image reconstructions of the cervical spine were also generated. COMPARISON:  Brain MRI 09/13/2016 and cervical spine CT 08/31/2007 FINDINGS: CT HEAD FINDINGS Brain: There is no evidence of acute infarct, intracranial hemorrhage, mass, midline shift, or extra-axial fluid collection. Mild cerebral atrophy is within normal limits for age. Patchy cerebral white matter hypodensities are nonspecific but compatible with moderate chronic small vessel ischemic disease. Vascular: Calcified atherosclerosis at the skull base. No hyperdense vessel. Skull: No fracture or focal osseous lesion. Sinuses/Orbits: Minimal left sphenoid sinus mucosal thickening. Clear mastoid air cells. Bilateral cataract extraction. Other: None. CT CERVICAL SPINE FINDINGS Alignment: Unchanged trace anterolisthesis of C3 on C4 and C4 on C5. Skull base and vertebrae: No acute fracture or suspicious osseous lesion. Soft tissues and spinal canal: No prevertebral fluid or swelling. No visible canal hematoma. Disc levels: Similar appearance of mild cervical spondylosis. Mild-to-moderate multilevel facet arthrosis has progressed. Mild left neural foraminal stenosis at C5-6. No osseous spinal canal stenosis. Upper chest: Clear lung apices. Other: Mild calcific atherosclerosis involving the carotid arteries. IMPRESSION: 1. No evidence of acute intracranial abnormality. 2. Moderate chronic small vessel ischemic disease. 3. No evidence of acute fracture or traumatic subluxation  in the cervical spine. Electronically Signed   By: Logan Bores M.D.   On: 02/21/2018 13:08   Ct Cervical Spine Wo Contrast  Result Date: 02/21/2018 CLINICAL DATA:  [place] out of bed. On Xarelto.  History of dementia. Initial encounter. EXAM: CT HEAD WITHOUT CONTRAST CT CERVICAL SPINE WITHOUT CONTRAST TECHNIQUE: Multidetector CT imaging of the head and cervical spine was performed following the standard protocol without intravenous contrast. Multiplanar CT image reconstructions of the cervical spine were also generated. COMPARISON:  Brain MRI 09/13/2016 and cervical spine CT 08/31/2007 FINDINGS: CT HEAD FINDINGS Brain: There is no evidence of acute infarct, intracranial hemorrhage, mass, midline shift, or extra-axial fluid collection. Mild cerebral atrophy is within normal limits for age. Patchy cerebral white matter hypodensities are nonspecific but compatible with moderate chronic small vessel ischemic disease. Vascular: Calcified atherosclerosis at the skull base. No hyperdense vessel. Skull: No fracture or focal osseous lesion. Sinuses/Orbits: Minimal left sphenoid sinus mucosal thickening. Clear mastoid air cells. Bilateral cataract extraction. Other: None. CT CERVICAL SPINE FINDINGS Alignment: Unchanged trace anterolisthesis of C3 on C4 and C4 on C5. Skull base and vertebrae: No acute fracture or suspicious osseous lesion. Soft tissues and spinal canal: No prevertebral fluid or swelling. No visible canal hematoma. Disc levels: Similar appearance of mild cervical spondylosis. Mild-to-moderate multilevel facet arthrosis has progressed. Mild left neural foraminal stenosis at C5-6. No osseous spinal canal stenosis. Upper chest: Clear lung apices. Other: Mild calcific atherosclerosis involving the carotid arteries. IMPRESSION: 1. No evidence of acute intracranial abnormality. 2. Moderate chronic small vessel ischemic disease. 3. No evidence of acute fracture or traumatic subluxation in the cervical spine. Electronically Signed   By: Logan Bores M.D.   On: 02/21/2018 13:08   Dg Hand Complete Right  Result Date: 02/21/2018 CLINICAL DATA:  Initial evaluation for acute trauma, fall. EXAM: RIGHT HAND -  COMPLETE 3+ VIEW COMPARISON:  None. FINDINGS: Acute transverse fracture extending through the distal right radius with dorsal angulation, better evaluated on concomitant radiograph of the right wrist. Associated fracture through the base of the ulnar styloid noted as well. No other acute fracture or dislocation about the hand. Moderate to advanced osteoarthritic changes throughout the hand, most notable at the first [facility] joint and thumb. Diffuse osteopenia. Soft tissue swelling overlies the wrist. IMPRESSION: 1. Acute fractures of the distal right radius and ulnar styloid, better evaluated on concomitant radiograph of the right wrist. 2. No other acute fracture or dislocation about the hand. 3. Moderate to advanced degenerative osteoarthrosis throughout the hand, most notable at the first [facility] joint and thumb. Electronically Signed   By: Jeannine Boga M.D.   On: 02/21/2018 13:18   Vas US Carotid  Result Date: 02/23/2018 Carotid Arterial Duplex Study Indications:       Syncope and Frequent falls. Risk Factors:      Coronary artery disease. Other Factors:     Atrial fibrillation. Comparison Study:  No prior study on file Performing Technologist: Sharion Dove RVS  Examination Guidelines: A complete evaluation includes B-mode imaging, spectral Doppler, color Doppler, and power Doppler as needed of all accessible portions of each vessel. Bilateral testing is considered an integral part of a complete examination. Limited examinations for reoccurring indications may be performed as noted.  Right Carotid Findings: +----------+--------+--------+--------+------------+------------------+           PSV cm/sEDV cm/sStenosisDescribe    Comments           +----------+--------+--------+--------+------------+------------------+ CCA Prox  79      17  intimal thickening +----------+--------+--------+--------+------------+------------------+ CCA Distal74      16                           intimal thickening +----------+--------+--------+--------+------------+------------------+ ICA Prox  64      16              heterogenous                   +----------+--------+--------+--------+------------+------------------+ ICA Distal63      19                                             +----------+--------+--------+--------+------------+------------------+ ECA       96      9                                              +----------+--------+--------+--------+------------+------------------+ +----------+--------+-------+--------+-------------------+           PSV cm/sEDV cmsDescribeArm Pressure (mmHG) +----------+--------+-------+--------+-------------------+ [id number]                                         +----------+--------+-------+--------+-------------------+ +---------+--------+--+--------+--+ VertebralPSV cm/s49EDV cm/s11 +---------+--------+--+--------+--+  Left Carotid Findings: +----------+--------+--------+--------+------------+------------------+           PSV cm/sEDV cm/sStenosisDescribe    Comments           +----------+--------+--------+--------+------------+------------------+ CCA Prox  98      15                          intimal thickening +----------+--------+--------+--------+------------+------------------+ CCA Distal75      14                          intimal thickening +----------+--------+--------+--------+------------+------------------+ ICA Prox  58      13              heterogenous                   +----------+--------+--------+--------+------------+------------------+ ICA Distal59      16                                             +----------+--------+--------+--------+------------+------------------+ ECA       86      11                                             +----------+--------+--------+--------+------------+------------------+  +----------+--------+--------+--------+-------------------+ SubclavianPSV cm/sEDV cm/sDescribeArm Pressure (mmHG) +----------+--------+--------+--------+-------------------+           172                                         +----------+--------+--------+--------+-------------------+ +---------+--------+--+--------+--+ VertebralPSV cm/s49EDV cm/s10 +---------+--------+--+--------+--+  Summary: Right Carotid: The extracranial vessels were near-normal with only minimal wall  thickening or plaque. Vertebrals:  Bilateral vertebral arteries demonstrate antegrade flow. Subclavians: Normal flow hemodynamics were seen in bilateral subclavian              arteries. *See table(s) above for measurements and observations.  Electronically signed by Curt Jews MD on 02/23/2018 at 9:05:19 AM.    Final     Time Spent in minutes  20   Walton Digilio M.D on 02/27/2018 at 1:16 PM  Between 7am to 7pm - Pager - [phone]  After 7pm go to [email or web address] - password [email or web address]  [facility] -  Office  [phone]

## 2018-02-28 ENCOUNTER — Encounter (HOSPITAL_COMMUNITY): Payer: Self-pay | Admitting: General Practice

## 2018-02-28 DIAGNOSIS — W19XXXA Unspecified fall, initial encounter: Secondary | ICD-10-CM

## 2018-02-28 DIAGNOSIS — S52501A Unspecified fracture of the lower end of right radius, initial encounter for closed fracture: Secondary | ICD-10-CM | POA: Diagnosis not present

## 2018-02-28 DIAGNOSIS — I10 Essential (primary) hypertension: Secondary | ICD-10-CM | POA: Diagnosis not present

## 2018-02-28 DIAGNOSIS — R55 Syncope and collapse: Secondary | ICD-10-CM | POA: Diagnosis not present

## 2018-02-28 MED ORDER — ALBUTEROL SULFATE (2.5 MG/3ML) 0.083% IN NEBU: 2.5000 mg | INHALATION_SOLUTION | RESPIRATORY_TRACT | 12 refills | Status: AC | PRN

## 2018-02-28 MED ORDER — DULOXETINE HCL 20 MG PO CPEP: 40.0000 mg | ORAL_CAPSULE | Freq: Every day | ORAL | Status: DC

## 2018-02-28 MED ORDER — POLYETHYLENE GLYCOL 3350 17 G PO PACK: 17.0000 g | PACK | Freq: Every day | ORAL | 0 refills | Status: AC | PRN

## 2018-02-28 MED ORDER — MAGNESIUM OXIDE 400 (241.3 MG) MG PO TABS: 400.0000 mg | ORAL_TABLET | Freq: Two times a day (BID) | ORAL | Status: AC

## 2018-02-28 NOTE — Clinical Social Work Note (Signed)
CSW facilitated patient discharge including contacting patient family and facility to confirm patient discharge plans. Clinical information faxed to facility and family agreeable with plan. CSW arranged ambulance transport via [facility] to [facility]. RN to call report prior to discharge [phone] Room [phone]).  CSW will sign off for now as social work intervention is no longer needed. Please consult [place] again if new needs arise.  Dayton Scrape, [place]

## 2018-02-28 NOTE — Progress Notes (Signed)
Confirmed with MD no printed prescription for potassium needed for discharge due to SNF placement Ordered pt TED hose Awaiting [facility] for transport

## 2018-02-28 NOTE — Progress Notes (Signed)
Pt resting comfortably in low bed, alarm set  Will continue to monitor

## 2018-02-28 NOTE — Discharge Summary (Signed)
Physician Discharge Summary  [name] IDP:[id number] DOB: January 15, 1935 DOA: 02/21/2018  PCP: Crist Infante, MD  Admit date: 02/21/2018 Discharge date: 02/28/2018  Time spent: 45 minutes  Recommendations for Outpatient Follow-up:  1. Follow up with orthopedics 1-2 weeks 2. Follow up with PCP 2-3 weeks. Recommend bmet for sodium and potassium level. Consider EKG to monitor QT   Discharge Diagnoses:  Principal Problem:   Syncope, vasovagal Active Problems:   Essential hypertension   Coronary artery disease   Hypokalemia   OSA on CPAP   Chronic diastolic heart failure (HCC)   Chronic atrial fibrillation   Chronic anticoagulation   Falls   Discharge Condition: stable  Diet recommendation: heart healthy  Filed Weights   02/25/18 0419 02/26/18 0549 02/28/18 0447  Weight: 61.2 kg 56.7 kg 56.7 kg    History of present illness:  84 year old female with history of CAD, severe dementia with cognitive deficits, hypertension, chronic A. fib on Eliquis presented to the ED from home several days ago with recurrent falls and confusion.  Plan for discharge but had another fall on 12/27.  Sustained fracture of her right radius.  Admitted for syncopal work-up.   Hospital Course:  Syncope, vasovagal versus orthostatic  2D echo with normal EF of 60-65%, severely dilated left atrium PA pressure of 47 mmHg.  Carotid Doppler negative for significant stenosis.  Head CT on presentation without acute findings.  PT recommends SNF. Orthostasis resolved with hydration.  Off telemetry. Continue TED hose.   Active Problems: Permanent atrial fibrillation Continue Eliquis.  Continue metoprolol 100 mg daily.  Right wrist fracture On presentation.  Close reduction done in the ED.  Dr. Amedeo Plenty saw her again on 1/1 and recommends outpatient follow-up in 1-2 weeks.    Coronary artery disease Continue statin and beta-blocker.    Chronic diastolic heart failure (HCC) Euvolemic.  Continue  beta-blocker.  Frequent falls Possibly secondary to orthostasis/vasovagal.  PT recommended SNF. Patient also has borderline prolonged QTC.  Trazodone held.  .  Hypokalemia Replenished  OSA Patient not using nighttime CPAP.  Procedures:  echo :The cavity size was mildly dilated. Systolic   function was normal. The estimated ejection fraction was in the   range of 60% to 65%. Wall motion was normal; there were no   regional wall motion abnormalities. Consultations:  [facility] ortho  Discharge Exam: Vitals:   02/28/18 0447 02/28/18 0903  BP: (!) 154/82 (!) 148/84  Pulse: 77 77  Resp: 18   Temp: 97.7 F (36.5 C)   SpO2: 95%     General: thin frail awake alert in no acute distress Cardiovascular: rrr no mgr no LE edema Respiratory: normal effort BS clear bilaterally MS: long arm cast right arm. Fingers warm to touch.   Discharge Instructions   Discharge Instructions    Diet - low sodium heart healthy   Complete by:  As directed    Discharge instructions   Complete by:  As directed    TED hose   Increase activity slowly   Complete by:  As directed      Allergies as of 02/28/2018      Reactions   Detrol [tolterodine] Other (See Comments)   Critical reaction-per MD office   Tranexamic Acid Other (See Comments)   Patient ineligible for Tranexamic acid due to hx of NSTEMI and CAD.   Xarelto [rivaroxaban] Other (See Comments)   Critical reaction   Pradaxa [dabigatran Etexilate Mesylate] Nausea And Vomiting, Other (See Comments)   Moderate reaction-per MD  Medication List    STOP taking these medications   amLODipine 5 MG tablet Commonly known as:  NORVASC   doxazosin 4 MG tablet Commonly known as:  CARDURA     TAKE these medications   albuterol (2.5 MG/3ML) 0.083% nebulizer solution Commonly known as:  PROVENTIL Take 3 mLs (2.5 mg total) by nebulization every 2 (two) hours as needed for wheezing.   apixaban 2.5 MG Tabs tablet Commonly known as:   ELIQUIS Take 1 tablet (2.5 mg total) by mouth 2 (two) times daily.   atorvastatin 40 MG tablet Commonly known as:  LIPITOR Take 20 mg by mouth daily. 1/2 tablet by mouth daily   bismuth subsalicylate 027 OZ/36UY suspension Commonly known as:  PEPTO BISMOL Take 30 mLs by mouth every 6 (six) hours as needed (vomitting).   calcium carbonate 500 MG chewable tablet Commonly known as:  TUMS - dosed in mg elemental calcium Chew 1 tablet by mouth 3 (three) times daily as needed for indigestion or heartburn.   DULoxetine 20 MG capsule Commonly known as:  CYMBALTA Take 2 capsules (40 mg total) by mouth at bedtime. What changed:  See the new instructions.   esomeprazole 40 MG packet Commonly known as:  NEXIUM Take 40 mg by mouth daily. DISSOLVE CONTENTS OF 1 PACKET IN 15ML OF WATER AND DRINK EVERY DAY   feeding supplement (ENSURE ENLIVE) Liqd Take 237 mLs by mouth 2 (two) times daily between meals.   galantamine 16 MG 24 hr capsule Commonly known as:  RAZADYNE ER Take 16 mg by mouth daily with breakfast.   magnesium oxide 400 (241.3 Mg) MG tablet Commonly known as:  MAG-OX Take 1 tablet (400 mg total) by mouth 2 (two) times daily.   metoprolol succinate 100 MG 24 hr tablet Commonly known as:  TOPROL-XL Take 1 tablet (100 mg total) daily by mouth. Take with or immediately following a meal.   nitroGLYCERIN 0.4 MG SL tablet Commonly known as:  NITROSTAT Place 1 tablet (0.4 mg total) under the tongue every 5 (five) minutes as needed for chest pain.   ondansetron 4 MG disintegrating tablet Commonly known as:  ZOFRAN-ODT Take 4 mg by mouth every 8 (eight) hours as needed for nausea or vomiting.   polyethylene glycol packet Commonly known as:  MIRALAX / GLYCOLAX Take 17 g by mouth daily as needed for mild constipation.   potassium chloride SA 20 MEQ tablet Commonly known as:  K-DUR,KLOR-CON Give potassium chloride 40 mEq p.o. x2 for a total of 80 meq on 02/21/2018, and give 40 mEq  p.o. x1 on 02/22/2018 then 20 mEq p.o. x1 on 02/23/2018 What changed:    how much to take  how to take this  when to take this  additional instructions   ranitidine 150 MG tablet Commonly known as:  ZANTAC Take 150 mg by mouth daily.      Allergies  Allergen Reactions  . Detrol [Tolterodine] Other (See Comments)    Critical reaction-per MD office  . Tranexamic Acid Other (See Comments)    Patient ineligible for Tranexamic acid due to hx of NSTEMI and CAD.  [name] Xarelto [Rivaroxaban] Other (See Comments)    Critical reaction  . Pradaxa [Dabigatran Etexilate Mesylate] Nausea And Vomiting and Other (See Comments)    Moderate reaction-per MD    Contact information for follow-up providers    Roseanne Kaufman, MD. Schedule an appointment as soon as possible for a visit in 14 day(s).   Specialty:  Orthopedic Surgery Why:  Please call the office so we can see you for follow-up check in 14 days Contact information: [address] [place] [address] [phone]        Schedule an appointment as soon as possible for a visit  with Crist Infante, MD.   Specialty:  Internal Medicine Why:  recheck potassium Contact information: [place] [address] [phone]            Contact information for after-discharge care    [facility] .   Service:  Skilled Nursing Contact information: [address] [place] [phone]                   The results of significant diagnostics from this hospitalization (including imaging, microbiology, ancillary and laboratory) are listed below for reference.    Significant Diagnostic Studies: Dg Chest 2 View  Result Date: 02/21/2018 CLINICAL DATA:  Initial evaluation for acute trauma, fall. EXAM: CHEST - 2 VIEW COMPARISON:  Prior radiograph from 04/27/2016. FINDINGS: Moderate cardiomegaly. Mediastinal silhouette normal. Aortic  atherosclerosis. Lungs normally inflated. Mild diffuse pulmonary vascular and interstitial congestion without overt pulmonary edema. No focal infiltrates. No pleural effusion. No pneumothorax. No acute osseous abnormality. Diffuse osteopenia. IMPRESSION: 1. Cardiomegaly with mild diffuse pulmonary vascular and interstitial congestion without overt pulmonary edema. 2. Aortic atherosclerosis. Electronically Signed   By: Jeannine Boga M.D.   On: 02/21/2018 13:20   Dg Forearm Right  Result Date: 02/21/2018 CLINICAL DATA:  Initial evaluation for acute trauma, fall. EXAM: RIGHT FOREARM - 2 VIEW COMPARISON:  None. FINDINGS: Acute transverse fracture through the distal right radius with impaction and slight dorsal angulation. Additional fracture through the base of the ulnar styloid better seen on concomitant radiograph of the hand. No other acute fracture or dislocation about the forearm. Soft tissue swelling overlies the wrist. Mild degenerative changes noted about the elbow. Osteopenia. IMPRESSION: 1. Acute transverse fracture through the distal right radius with impaction and slight dorsal angulation. 2. Acute nondisplaced fracture through the base of the ulnar styloid. 3. No other acute traumatic injury about the forearm. Electronically Signed   By: Jeannine Boga M.D.   On: 02/21/2018 13:22   Ct Head Wo Contrast  Result Date: 02/21/2018 CLINICAL DATA:  [place] out of bed. On Xarelto. History of dementia. Initial encounter. EXAM: CT HEAD WITHOUT CONTRAST CT CERVICAL SPINE WITHOUT CONTRAST TECHNIQUE: Multidetector CT imaging of the head and cervical spine was performed following the standard protocol without intravenous contrast. Multiplanar CT image reconstructions of the cervical spine were also generated. COMPARISON:  Brain MRI 09/13/2016 and cervical spine CT 08/31/2007 FINDINGS: CT HEAD FINDINGS Brain: There is no evidence of acute infarct, intracranial hemorrhage, mass, midline shift, or  extra-axial fluid collection. Mild cerebral atrophy is within normal limits for age. Patchy cerebral white matter hypodensities are nonspecific but compatible with moderate chronic small vessel ischemic disease. Vascular: Calcified atherosclerosis at the skull base. No hyperdense vessel. Skull: No fracture or focal osseous lesion. Sinuses/Orbits: Minimal left sphenoid sinus mucosal thickening. Clear mastoid air cells. Bilateral cataract extraction. Other: None. CT CERVICAL SPINE FINDINGS Alignment: Unchanged trace anterolisthesis of C3 on C4 and C4 on C5. Skull base and vertebrae: No acute fracture or suspicious osseous lesion. Soft tissues and spinal canal: No prevertebral fluid or swelling. No visible canal hematoma. Disc levels: Similar appearance of mild cervical spondylosis. Mild-to-moderate multilevel facet arthrosis has progressed. Mild left neural foraminal stenosis at C5-6.  No osseous spinal canal stenosis. Upper chest: Clear lung apices. Other: Mild calcific atherosclerosis involving the carotid arteries. IMPRESSION: 1. No evidence of acute intracranial abnormality. 2. Moderate chronic small vessel ischemic disease. 3. No evidence of acute fracture or traumatic subluxation in the cervical spine. Electronically Signed   By: Logan Bores M.D.   On: 02/21/2018 13:08   Ct Cervical Spine Wo Contrast  Result Date: 02/21/2018 CLINICAL DATA:  [place] out of bed. On Xarelto. History of dementia. Initial encounter. EXAM: CT HEAD WITHOUT CONTRAST CT CERVICAL SPINE WITHOUT CONTRAST TECHNIQUE: Multidetector CT imaging of the head and cervical spine was performed following the standard protocol without intravenous contrast. Multiplanar CT image reconstructions of the cervical spine were also generated. COMPARISON:  Brain MRI 09/13/2016 and cervical spine CT 08/31/2007 FINDINGS: CT HEAD FINDINGS Brain: There is no evidence of acute infarct, intracranial hemorrhage, mass, midline shift, or extra-axial fluid collection.  Mild cerebral atrophy is within normal limits for age. Patchy cerebral white matter hypodensities are nonspecific but compatible with moderate chronic small vessel ischemic disease. Vascular: Calcified atherosclerosis at the skull base. No hyperdense vessel. Skull: No fracture or focal osseous lesion. Sinuses/Orbits: Minimal left sphenoid sinus mucosal thickening. Clear mastoid air cells. Bilateral cataract extraction. Other: None. CT CERVICAL SPINE FINDINGS Alignment: Unchanged trace anterolisthesis of C3 on C4 and C4 on C5. Skull base and vertebrae: No acute fracture or suspicious osseous lesion. Soft tissues and spinal canal: No prevertebral fluid or swelling. No visible canal hematoma. Disc levels: Similar appearance of mild cervical spondylosis. Mild-to-moderate multilevel facet arthrosis has progressed. Mild left neural foraminal stenosis at C5-6. No osseous spinal canal stenosis. Upper chest: Clear lung apices. Other: Mild calcific atherosclerosis involving the carotid arteries. IMPRESSION: 1. No evidence of acute intracranial abnormality. 2. Moderate chronic small vessel ischemic disease. 3. No evidence of acute fracture or traumatic subluxation in the cervical spine. Electronically Signed   By: Logan Bores M.D.   On: 02/21/2018 13:08   Dg Hand Complete Right  Result Date: 02/21/2018 CLINICAL DATA:  Initial evaluation for acute trauma, fall. EXAM: RIGHT HAND - COMPLETE 3+ VIEW COMPARISON:  None. FINDINGS: Acute transverse fracture extending through the distal right radius with dorsal angulation, better evaluated on concomitant radiograph of the right wrist. Associated fracture through the base of the ulnar styloid noted as well. No other acute fracture or dislocation about the hand. Moderate to advanced osteoarthritic changes throughout the hand, most notable at the first [facility] joint and thumb. Diffuse osteopenia. Soft tissue swelling overlies the wrist. IMPRESSION: 1. Acute fractures of the distal right  radius and ulnar styloid, better evaluated on concomitant radiograph of the right wrist. 2. No other acute fracture or dislocation about the hand. 3. 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 joint and thumb. Electronically Signed   By: Jeannine Boga M.D.   On: 02/21/2018 13:18   Vas US Carotid  Result Date: 02/23/2018 Carotid Arterial Duplex Study Indications:       Syncope and Frequent falls. Risk Factors:      Coronary artery disease. Other Factors:     Atrial fibrillation. Comparison Study:  No prior study on file Performing Technologist: Sharion Dove RVS  Examination Guidelines: A complete evaluation includes B-mode imaging, spectral Doppler, color Doppler, and power Doppler as needed of all accessible portions of each vessel. Bilateral testing is considered an integral part of a complete examination. Limited examinations for reoccurring indications may be performed as noted.  Right Carotid Findings: +----------+--------+--------+--------+------------+------------------+           PSV cm/sEDV cm/sStenosisDescribe    Comments           +----------+--------+--------+--------+------------+------------------+ CCA Prox  79      17                          intimal thickening +----------+--------+--------+--------+------------+------------------+ CCA Distal74      16                          intimal thickening +----------+--------+--------+--------+------------+------------------+ ICA Prox  64      16              heterogenous                   +----------+--------+--------+--------+------------+------------------+ ICA Distal63      19                                             +----------+--------+--------+--------+------------+------------------+ ECA       96      9                                              +----------+--------+--------+--------+------------+------------------+  +----------+--------+-------+--------+-------------------+           PSV cm/sEDV cmsDescribeArm Pressure (mmHG) +----------+--------+-------+--------+-------------------+ [id number]                                         +----------+--------+-------+--------+-------------------+ +---------+--------+--+--------+--+ VertebralPSV cm/s49EDV cm/s11 +---------+--------+--+--------+--+  Left Carotid Findings: +----------+--------+--------+--------+------------+------------------+           PSV cm/sEDV cm/sStenosisDescribe    Comments           +----------+--------+--------+--------+------------+------------------+ CCA Prox  98      15                          intimal thickening +----------+--------+--------+--------+------------+------------------+ CCA Distal75      14                          intimal thickening +----------+--------+--------+--------+------------+------------------+ ICA Prox  58      13              heterogenous                   +----------+--------+--------+--------+------------+------------------+ ICA Distal59      16                                             +----------+--------+--------+--------+------------+------------------+ ECA       86      11                                             +----------+--------+--------+--------+------------+------------------+ +----------+--------+--------+--------+-------------------+ SubclavianPSV cm/sEDV cm/sDescribeArm Pressure (mmHG) +----------+--------+--------+--------+-------------------+  172                                         +----------+--------+--------+--------+-------------------+ +---------+--------+--+--------+--+ VertebralPSV cm/s49EDV cm/s10 +---------+--------+--+--------+--+  Summary: Right Carotid: The extracranial vessels were near-normal with only minimal wall                thickening or plaque. Vertebrals:  Bilateral vertebral arteries  demonstrate antegrade flow. Subclavians: Normal flow hemodynamics were seen in bilateral subclavian              arteries. *See table(s) above for measurements and observations.  Electronically signed by Curt Jews MD on 02/23/2018 at 9:05:19 AM.    Final     Microbiology: No results found for this or any previous visit (from the past 240 hour(s)).   Labs: Basic Metabolic Panel: Recent Labs  Lab 02/21/18 1341 02/21/18 1825 02/22/18 0426 02/24/18 0505  NA 138  --  136 136  K 2.7*  --  2.8* 3.1*  CL 101  --  100 100  CO2 25  --  23 25  GLUCOSE 107*  --  90 110*  BUN 11  --  7* 8  CREATININE 0.55  --  0.58 0.66  CALCIUM 9.5  --  8.6* 8.9  MG  --  1.7  --   --    Liver Function Tests: Recent Labs  Lab 02/21/18 1341  AST 25  ALT 12  ALKPHOS 47  BILITOT 2.0*  PROT 6.6  ALBUMIN 3.9   No results for input(s): LIPASE, AMYLASE in the last 168 hours. No results for input(s): AMMONIA in the last 168 hours. CBC: Recent Labs  Lab 02/21/18 1454 02/22/18 0426  WBC 7.3 6.1  NEUTROABS 5.4  --   HGB 12.2 11.7*  HCT 40.0 34.9*  MCV 95.2 89.7  PLT 129* 167   Cardiac Enzymes: Recent Labs  Lab 02/21/18 2012 02/22/18 0426  TROPONINI <0.03 <0.03   BNP: BNP (last 3 results) No results for input(s): BNP in the last 8760 hours.  ProBNP (last 3 results) No results for input(s): PROBNP in the last 8760 hours.  CBG: No results for input(s): GLUCAP in the last 168 hours.     SignedRadene Gunning NP [facility] 02/28/2018, 11:22 AM

## 2018-02-28 NOTE — Progress Notes (Signed)
Report given to RN at SNF  Pt has all belongings, IV removed, catheter intact PTAR has all paper work  Pt discharged via stretcher with transport

## 2018-02-28 NOTE — Progress Notes (Signed)
Pt refuses CPAP for the night.  No distress noted.

## 2018-02-28 NOTE — Plan of Care (Signed)
  Problem: Health Behavior/Discharge Planning: Goal: Ability to manage health-related needs will improve Outcome: Progressing   Problem: Clinical Measurements: Goal: Ability to maintain clinical measurements within normal limits will improve Outcome: Progressing Goal: Will remain free from infection Outcome: Progressing Goal: Diagnostic test results will improve Outcome: Progressing Goal: Respiratory complications will improve Outcome: Progressing Goal: Cardiovascular complication will be avoided Outcome: Progressing   Problem: Activity: Goal: Risk for activity intolerance will decrease Outcome: Progressing   Problem: Nutrition: Goal: Adequate nutrition will be maintained Outcome: Progressing   Problem: Coping: Goal: Level of anxiety will decrease Outcome: Progressing   Problem: Elimination: Goal: Will not experience complications related to bowel motility Outcome: Progressing Goal: Will not experience complications related to urinary retention Outcome: Progressing   Problem: Pain Managment: Goal: General experience of comfort will improve Outcome: Progressing   Problem: Safety: Goal: Ability to remain free from injury will improve Outcome: Progressing   Problem: Skin Integrity: Goal: Risk for impaired skin integrity will decrease Outcome: Progressing   Problem: Education: Goal: Ability to demonstrate management of disease process will improve Outcome: Progressing Goal: Ability to verbalize understanding of medication therapies will improve Outcome: Progressing Goal: Individualized Educational Video(s) Outcome: Progressing   Problem: Activity: Goal: Capacity to carry out activities will improve Outcome: Progressing   Problem: Cardiac: Goal: Ability to achieve and maintain adequate cardiopulmonary perfusion will improve Outcome: Progressing   

## 2018-02-28 NOTE — Clinical Social Work Note (Addendum)
Insurance authorization is still being reviewed by the Market researcher. SNF said there is potential that she will get denied due to walking 180 feet min assist yesterday in PT. CSW called and updated patient's relative. He is agreeable to HHPT at [place] if she does get denied.  Dayton Scrape, [place] [phone]  9:45 am Patient has insurance approval to discharge to [facility] today. Paged NP to notify. Called Mr. Schonberger and notified him as well.   Dayton Scrape, [place]

## 2018-02-28 NOTE — Clinical Social Work Placement (Signed)
   CLINICAL SOCIAL WORK PLACEMENT  NOTE  Date:  02/28/2018  Patient Details  Name: POONAM WOEHRLE MRN: [id number] Date of Birth: [birth year]  Clinical Social Work is seeking post-discharge placement for this patient at the [place] level of care (*CSW will initial, date and re-position this form in  chart as items are completed):      Patient/family provided with [facility] Work Department's list of facilities offering this level of care within the geographic area requested by the patient (or if unable, by the patient's family).      Patient/family informed of their freedom to choose among providers that offer the needed level of care, that participate in Medicare, Medicaid or managed care program needed by the patient, have an available bed and are willing to accept the patient.      Patient/family informed of [facility]'s ownership interest in [facility] and [facility], as well as of the fact that they are under no obligation to receive care at these facilities.  PASRR submitted to EDS on 02/24/18     PASRR number received on 02/24/18     Existing PASRR number confirmed on       FL2 transmitted to all facilities in geographic area requested by pt/family on 02/24/18     FL2 transmitted to all facilities within larger geographic area on       Patient informed that his/her managed care company has contracts with or will negotiate with certain facilities, including the following:        Yes   Patient/family informed of bed offers received.  Patient chooses bed at [facility]     Physician recommends and patient chooses bed at      Patient to be transferred to [facility] on 02/28/18.  Patient to be transferred to facility by [facility]     Patient family notified on 02/28/18 of transfer.  Name of family member notified:  Lillie Columbia     PHYSICIAN Please prepare prescriptions     Additional Comment:     _______________________________________________ Candie Chroman, LCSW 02/28/2018, 9:46 AM

## 2018-03-06 ENCOUNTER — Other Ambulatory Visit (INDEPENDENT_AMBULATORY_CARE_PROVIDER_SITE_OTHER): Payer: Self-pay | Admitting: Physical Medicine and Rehabilitation

## 2018-03-06 NOTE — Telephone Encounter (Signed)
Please advise 

## 2018-04-01 ENCOUNTER — Inpatient Hospital Stay (HOSPITAL_COMMUNITY)
Admission: EM | Admit: 2018-04-01 | Discharge: 2018-04-04 | DRG: 065 | Disposition: A | Discharge status: Nursing Home (Includes ICF) | Payer: Medicare Other | Source: Skilled Nursing Facility | Attending: Internal Medicine | Admitting: Internal Medicine

## 2018-04-01 ENCOUNTER — Observation Stay (HOSPITAL_COMMUNITY): Payer: Medicare Other

## 2018-04-01 ENCOUNTER — Emergency Department (HOSPITAL_COMMUNITY): Payer: Medicare Other

## 2018-04-01 ENCOUNTER — Encounter (HOSPITAL_COMMUNITY): Payer: Self-pay | Admitting: Emergency Medicine

## 2018-04-01 DIAGNOSIS — K589 Irritable bowel syndrome without diarrhea: Secondary | ICD-10-CM | POA: Diagnosis present

## 2018-04-01 DIAGNOSIS — Z9842 Cataract extraction status, left eye: Secondary | ICD-10-CM

## 2018-04-01 DIAGNOSIS — Z833 Family history of diabetes mellitus: Secondary | ICD-10-CM

## 2018-04-01 DIAGNOSIS — Z8249 Family history of ischemic heart disease and other diseases of the circulatory system: Secondary | ICD-10-CM

## 2018-04-01 DIAGNOSIS — E785 Hyperlipidemia, unspecified: Secondary | ICD-10-CM | POA: Diagnosis present

## 2018-04-01 DIAGNOSIS — Z66 Do not resuscitate: Secondary | ICD-10-CM | POA: Diagnosis present

## 2018-04-01 DIAGNOSIS — I482 Chronic atrial fibrillation, unspecified: Secondary | ICD-10-CM | POA: Diagnosis not present

## 2018-04-01 DIAGNOSIS — S52201D Unspecified fracture of shaft of right ulna, subsequent encounter for closed fracture with routine healing: Secondary | ICD-10-CM

## 2018-04-01 DIAGNOSIS — Z79899 Other long term (current) drug therapy: Secondary | ICD-10-CM

## 2018-04-01 DIAGNOSIS — Z7901 Long term (current) use of anticoagulants: Secondary | ICD-10-CM

## 2018-04-01 DIAGNOSIS — F0390 Unspecified dementia without behavioral disturbance: Secondary | ICD-10-CM | POA: Diagnosis present

## 2018-04-01 DIAGNOSIS — F0150 Vascular dementia without behavioral disturbance: Secondary | ICD-10-CM | POA: Diagnosis present

## 2018-04-01 DIAGNOSIS — K219 Gastro-esophageal reflux disease without esophagitis: Secondary | ICD-10-CM | POA: Diagnosis present

## 2018-04-01 DIAGNOSIS — I252 Old myocardial infarction: Secondary | ICD-10-CM

## 2018-04-01 DIAGNOSIS — W19XXXD Unspecified fall, subsequent encounter: Secondary | ICD-10-CM | POA: Diagnosis present

## 2018-04-01 DIAGNOSIS — R4182 Altered mental status, unspecified: Secondary | ICD-10-CM | POA: Diagnosis present

## 2018-04-01 DIAGNOSIS — F419 Anxiety disorder, unspecified: Secondary | ICD-10-CM | POA: Diagnosis present

## 2018-04-01 DIAGNOSIS — I2510 Atherosclerotic heart disease of native coronary artery without angina pectoris: Secondary | ICD-10-CM | POA: Diagnosis present

## 2018-04-01 DIAGNOSIS — Z9841 Cataract extraction status, right eye: Secondary | ICD-10-CM

## 2018-04-01 DIAGNOSIS — Z7189 Other specified counseling: Secondary | ICD-10-CM

## 2018-04-01 DIAGNOSIS — Z96651 Presence of right artificial knee joint: Secondary | ICD-10-CM | POA: Diagnosis present

## 2018-04-01 DIAGNOSIS — S52501D Unspecified fracture of the lower end of right radius, subsequent encounter for closed fracture with routine healing: Secondary | ICD-10-CM

## 2018-04-01 DIAGNOSIS — I6381 Other cerebral infarction due to occlusion or stenosis of small artery: Principal | ICD-10-CM | POA: Diagnosis present

## 2018-04-01 DIAGNOSIS — Z515 Encounter for palliative care: Secondary | ICD-10-CM

## 2018-04-01 DIAGNOSIS — G4733 Obstructive sleep apnea (adult) (pediatric): Secondary | ICD-10-CM | POA: Diagnosis present

## 2018-04-01 DIAGNOSIS — Z993 Dependence on wheelchair: Secondary | ICD-10-CM

## 2018-04-01 DIAGNOSIS — I110 Hypertensive heart disease with heart failure: Secondary | ICD-10-CM | POA: Diagnosis present

## 2018-04-01 DIAGNOSIS — R296 Repeated falls: Secondary | ICD-10-CM | POA: Diagnosis present

## 2018-04-01 DIAGNOSIS — F05 Delirium due to known physiological condition: Secondary | ICD-10-CM | POA: Diagnosis present

## 2018-04-01 DIAGNOSIS — I10 Essential (primary) hypertension: Secondary | ICD-10-CM | POA: Diagnosis present

## 2018-04-01 DIAGNOSIS — R29810 Facial weakness: Secondary | ICD-10-CM | POA: Diagnosis present

## 2018-04-01 DIAGNOSIS — M1990 Unspecified osteoarthritis, unspecified site: Secondary | ICD-10-CM | POA: Diagnosis present

## 2018-04-01 DIAGNOSIS — Z800 Family history of malignant neoplasm of digestive organs: Secondary | ICD-10-CM

## 2018-04-01 DIAGNOSIS — I5032 Chronic diastolic (congestive) heart failure: Secondary | ICD-10-CM | POA: Diagnosis present

## 2018-04-01 DIAGNOSIS — Z888 Allergy status to other drugs, medicaments and biological substances status: Secondary | ICD-10-CM

## 2018-04-01 DIAGNOSIS — I639 Cerebral infarction, unspecified: Secondary | ICD-10-CM | POA: Diagnosis present

## 2018-04-01 LAB — CBG MONITORING, ED: Glucose-Capillary: 102 mg/dL — ABNORMAL HIGH (ref 70–99)

## 2018-04-01 LAB — COMPREHENSIVE METABOLIC PANEL
ALK PHOS: 82 U/L (ref 38–126)
ALT: 9 U/L (ref 0–44)
AST: 21 U/L (ref 15–41)
Albumin: 3.6 g/dL (ref 3.5–5.0)
Anion gap: 13 (ref 5–15)
BUN: 15 mg/dL (ref 8–23)
CO2: 28 mmol/L (ref 22–32)
Calcium: 10.4 mg/dL — ABNORMAL HIGH (ref 8.9–10.3)
Chloride: 95 mmol/L — ABNORMAL LOW (ref 98–111)
Creatinine, Ser: 0.97 mg/dL (ref 0.44–1.00)
GFR calc Af Amer: >60 mL/min (ref >60)
GFR calc non Af Amer: 55 mL/min — ABNORMAL LOW (ref >60)
Glucose, Bld: 106 mg/dL — ABNORMAL HIGH (ref 70–99)
Potassium: 3.8 mmol/L (ref 3.5–5.1)
Sodium: 136 mmol/L (ref 135–145)
Total Bilirubin: 1.3 mg/dL — ABNORMAL HIGH (ref 0.3–1.2)
Total Protein: 7.1 g/dL (ref 6.5–8.1)

## 2018-04-01 LAB — CBC
HEMATOCRIT: 45.1 % (ref 36.0–46.0)
Hemoglobin: 14.5 g/dL (ref 12.0–15.0)
MCH: 28.9 pg (ref 26.0–34.0)
MCHC: 32.2 g/dL (ref 30.0–36.0)
MCV: 89.8 fL (ref 80.0–100.0)
Platelets: 208 10*3/uL (ref 150–400)
RBC: 5.02 MIL/uL (ref 3.87–5.11)
RDW: 12.4 % (ref 11.5–15.5)
WBC: 6.9 10*3/uL (ref 4.0–10.5)
nRBC: 0 % (ref 0.0–0.2)

## 2018-04-01 LAB — DIFFERENTIAL
ABS IMMATURE GRANULOCYTES: 0.01 10*3/uL (ref 0.00–0.07)
Basophils Absolute: 0 10*3/uL (ref 0.0–0.1)
Basophils Relative: 1 %
Eosinophils Absolute: 0.1 10*3/uL (ref 0.0–0.5)
Eosinophils Relative: 1 %
Immature Granulocytes: 0 %
Lymphocytes Relative: 18 %
Lymphs Abs: 1.3 10*3/uL (ref 0.7–4.0)
Monocytes Absolute: 0.9 10*3/uL (ref 0.1–1.0)
Monocytes Relative: 13 %
Neutro Abs: 4.6 10*3/uL (ref 1.7–7.7)
Neutrophils Relative %: 67 %

## 2018-04-01 LAB — RAPID URINE DRUG SCREEN, HOSP PERFORMED
Amphetamines: NOT DETECTED
Barbiturates: NOT DETECTED
Benzodiazepines: NOT DETECTED
Cocaine: NOT DETECTED
Opiates: NOT DETECTED
Tetrahydrocannabinol: NOT DETECTED

## 2018-04-01 LAB — URINALYSIS, ROUTINE W REFLEX MICROSCOPIC
Bilirubin Urine: NEGATIVE
Glucose, UA: NEGATIVE mg/dL
Hgb urine dipstick: NEGATIVE
Ketones, ur: NEGATIVE mg/dL
Leukocytes, UA: NEGATIVE
Nitrite: NEGATIVE
PROTEIN: NEGATIVE mg/dL
Specific Gravity, Urine: 1.017 (ref 1.005–1.030)
pH: 6 (ref 5.0–8.0)

## 2018-04-01 LAB — APTT: aPTT: 37 seconds — ABNORMAL HIGH (ref 24–36)

## 2018-04-01 LAB — PROTIME-INR
INR: 1.21
Prothrombin Time: 15.2 seconds (ref 11.4–15.2)

## 2018-04-01 LAB — I-STAT TROPONIN, ED: TROPONIN I, POC: 0.01 ng/mL (ref 0.00–0.08)

## 2018-04-01 LAB — ETHANOL: Alcohol, Ethyl (B): <10 mg/dL (ref <10)

## 2018-04-01 MED ORDER — HYDRALAZINE HCL 20 MG/ML IJ SOLN
5.0000 mg | Freq: Four times a day (QID) | INTRAMUSCULAR | Status: DC | PRN
  Administered 2018-04-01 – 2018-04-03 (×4): 5 mg via INTRAVENOUS
  Filled 2018-04-01 (×4): qty 1

## 2018-04-01 MED ORDER — APIXABAN 2.5 MG PO TABS
2.5000 mg | ORAL_TABLET | Freq: Two times a day (BID) | ORAL | Status: DC
  Administered 2018-04-02 – 2018-04-04 (×5): 2.5 mg via ORAL
  Filled 2018-04-01 (×6): qty 1

## 2018-04-01 MED ORDER — GALANTAMINE HYDROBROMIDE ER 8 MG PO CP24
16.0000 mg | ORAL_CAPSULE | Freq: Every day | ORAL | Status: DC
  Administered 2018-04-02 – 2018-04-04 (×3): 16 mg via ORAL
  Filled 2018-04-01 (×4): qty 2

## 2018-04-01 MED ORDER — ATORVASTATIN CALCIUM 10 MG PO TABS
20.0000 mg | ORAL_TABLET | Freq: Every day | ORAL | Status: DC
  Administered 2018-04-02 – 2018-04-04 (×3): 20 mg via ORAL
  Filled 2018-04-01 (×3): qty 2

## 2018-04-01 MED ORDER — DULOXETINE HCL 20 MG PO CPEP
40.0000 mg | ORAL_CAPSULE | Freq: Every day | ORAL | Status: DC
  Administered 2018-04-02 – 2018-04-03 (×2): 40 mg via ORAL
  Filled 2018-04-01 (×3): qty 2

## 2018-04-01 MED ORDER — ACETAMINOPHEN 325 MG PO TABS
650.0000 mg | ORAL_TABLET | ORAL | Status: DC | PRN
  Administered 2018-04-03: 650 mg via ORAL
  Filled 2018-04-01 (×2): qty 2

## 2018-04-01 MED ORDER — PANTOPRAZOLE SODIUM 40 MG PO TBEC
40.0000 mg | DELAYED_RELEASE_TABLET | Freq: Every day | ORAL | Status: DC
  Administered 2018-04-02 – 2018-04-04 (×3): 40 mg via ORAL
  Filled 2018-04-01 (×3): qty 1

## 2018-04-01 MED ORDER — CALCIUM CARBONATE ANTACID 500 MG PO CHEW: 1.0000 | CHEWABLE_TABLET | Freq: Three times a day (TID) | ORAL | Status: DC | PRN

## 2018-04-01 MED ORDER — ACETAMINOPHEN 160 MG/5ML PO SOLN: 650.0000 mg | ORAL | Status: DC | PRN

## 2018-04-01 MED ORDER — POLYETHYLENE GLYCOL 3350 17 G PO PACK
17.0000 g | PACK | Freq: Every day | ORAL | Status: DC | PRN
  Administered 2018-04-03: 17 g via ORAL
  Filled 2018-04-01: qty 1

## 2018-04-01 MED ORDER — ALBUTEROL SULFATE (2.5 MG/3ML) 0.083% IN NEBU: 2.5000 mg | INHALATION_SOLUTION | RESPIRATORY_TRACT | Status: DC | PRN

## 2018-04-01 MED ORDER — METOPROLOL SUCCINATE ER 100 MG PO TB24
100.0000 mg | ORAL_TABLET | Freq: Every day | ORAL | Status: DC
  Administered 2018-04-02 – 2018-04-04 (×3): 100 mg via ORAL
  Filled 2018-04-01 (×3): qty 1

## 2018-04-01 MED ORDER — ACETAMINOPHEN 650 MG RE SUPP: 650.0000 mg | RECTAL | Status: DC | PRN

## 2018-04-01 MED ORDER — SODIUM CHLORIDE 0.9 % IV SOLN
INTRAVENOUS | Status: DC
  Administered 2018-04-02 – 2018-04-04 (×3): via INTRAVENOUS

## 2018-04-01 MED ORDER — NITROGLYCERIN 0.4 MG SL SUBL: 0.4000 mg | SUBLINGUAL_TABLET | SUBLINGUAL | Status: DC | PRN

## 2018-04-01 MED ORDER — SENNOSIDES-DOCUSATE SODIUM 8.6-50 MG PO TABS
1.0000 | ORAL_TABLET | Freq: Every evening | ORAL | Status: DC | PRN
  Administered 2018-04-02: 1 via ORAL
  Filled 2018-04-01: qty 1

## 2018-04-01 MED ORDER — STROKE: EARLY STAGES OF RECOVERY BOOK
Freq: Once | Status: DC
  Filled 2018-04-01: qty 1

## 2018-04-01 NOTE — ED Provider Notes (Signed)
[facility] DEPARTMENT Provider Note   CSN: [id number] Arrival date & time: 04/01/18  1130  LEVEL 5 CAVEAT - ALTERED MENTAL STATUS    History   Chief Complaint Chief Complaint  Patient presents with  . Weakness    stroke-like symptoms     HPI [name] is a 84 y.o. female.  HPI  84 year old female presents from [place] office with concern for stroke.  Last known normal was 2/1.  She is currently in a rehab facility after breaking her forearm.  Family saw her today and noted that she seemed to have a right facial droop and was leaning to the right.  Took her to the doctor and was sent here for concern for stroke.  The patient has a history of dementia and is apparently near baseline but is answering questions appropriately for EMS.  She states she has a slight headache when asked here but otherwise denies any acute complaints.  Past Medical History:  Diagnosis Date  . Arthritis   . Atrial fibrillation ( [place])   . Bladder polyps   . Cancer ([place])    stomach  . Cataracts, bilateral   . Cervical polyp   . Chronic kidney disease    urinary incont-  . Coronary artery disease    minimal  . Dysrhythmia     AF  hx     dr Acie Fredrickson  . GERD (gastroesophageal reflux disease)   . Hiatal hernia   . Hyperlipidemia   . Hypertension   . Hypokalemia   . IBS (irritable bowel syndrome)   . IFG (impaired fasting glucose)   . Incontinence of urine   . MI, acute, non ST segment elevation ([place]) [id number].[id number]   cath  . OSA (obstructive sleep apnea)    hypoxia , not CO 2 retainer,   . OSA on CPAP 06/16/2013    Sleep study 1- 11-15 with an AHI of 61 , now titrated to CPAP 9 cm water with 2 cm EPR.   . Osteopenia   . Stromal tumor of the stomach [facility])    cancer    Patient Active Problem List   Diagnosis Date Noted  . Falls 02/21/2018  . Syncope, vasovagal 02/21/2018  . Hyponatremia 12/27/2017  . Traumatic arthritis of ankle, right 08/12/2017  . History of  non-ST elevation myocardial infarction (NSTEMI) 12/17/2016  . Chronic anticoagulation 12/17/2016  . Chest pain with moderate risk for cardiac etiology 05/30/2015  . Internal and external hemorrhoids without complication 52/84/1324  . History of GI bleed 06/24/2014  . Chronic diastolic heart failure ([place]) 06/24/2014  . Chronic atrial fibrillation 06/24/2014  . Pre-syncope 05/05/2014  . Osteoarthritis of right knee 08/10/2013  . OSA on CPAP 06/16/2013  . Bradycardia 10/25/2010  . Fatigue 10/25/2010  . Dyspnea 09/18/2010  . Coronary artery disease   . Hypokalemia   . Incontinence of urine   . Stromal tumor of the stomach ([place])   . Dyslipidemia 05/06/2007  . Essential hypertension 05/06/2007  . GERD 05/06/2007  . IRRITABLE BOWEL SYNDROME 05/06/2007    Past Surgical History:  Procedure Laterality Date  . ANKLE FRACTURE SURGERY  2006   right, plate   . CARDIAC CATHETERIZATION  12/07/20210   Dr.Nahser,minor cad  . COLONOSCOPY    . COLONOSCOPY N/A 2020-09-2714   Procedure: COLONOSCOPY;  Surgeon: Inda Castle, MD;  Location: [facility];  Service: Endoscopy;  Laterality: N/A;  . ENTEROSCOPY N/A 2020-09-2714   Procedure: ENTEROSCOPY;  Surgeon: Inda Castle,  MD;  Location: [facility];  Service: Endoscopy;  Laterality: N/A;  . EYE SURGERY     both cataracts  . KNEE ARTHROSCOPY Right 05/28/2012   Procedure: RIGHT KNEE ARTHROSCOPY PARTIAL MEDIAL AND LATERAL MENISECTOMY WITH CHONDROPLASTY;  Surgeon: Alta Corning, MD;  Location: [facility];  Service: Orthopedics;  Laterality: Right;  . stromal stomach  tumor  03/26/2007   resection  . TOTAL KNEE ARTHROPLASTY Right 08/10/2013   Procedure: TOTAL KNEE ARTHROPLASTY;  Surgeon: Alta Corning, MD;  Location: [facility];  Service: Orthopedics;  Laterality: Right;  . UPPER GI ENDOSCOPY       OB History   No obstetric history on file.      Home Medications    Prior to Admission medications   Medication Sig Start Date End Date  Taking? Authorizing Provider  albuterol (PROVENTIL) (2.5 MG/3ML) 0.083% nebulizer solution Take 3 mLs (2.5 mg total) by nebulization every 2 (two) hours as needed for wheezing. 02/28/18   Geradine Girt, DO  apixaban (ELIQUIS) 2.5 MG TABS tablet Take 1 tablet (2.5 mg total) by mouth 2 (two) times daily. 12/29/17   Eugenie Filler, MD  atorvastatin (LIPITOR) 40 MG tablet Take 20 mg by mouth daily. 1/2 tablet by mouth daily     [provider]  bismuth subsalicylate (PEPTO BISMOL) 262 MG/15ML suspension Take 30 mLs by mouth every 6 (six) hours as needed (vomitting).    [provider]  calcium carbonate (TUMS - DOSED IN MG ELEMENTAL CALCIUM) 500 MG chewable tablet Chew 1 tablet by mouth 3 (three) times daily as needed for indigestion or heartburn.    [provider]  DULoxetine (CYMBALTA) 20 MG capsule TAKE 1 CAPSULE AT NIGHT FOR 7 NIGHTS THEN TAKE 2 CAPSULES AT NIGHT 03/06/18   Magnus Sinning, MD  esomeprazole (NEXIUM) 40 MG packet Take 40 mg by mouth daily. DISSOLVE CONTENTS OF 1 PACKET IN 15ML OF WATER AND DRINK EVERY DAY    [provider]  feeding supplement, ENSURE ENLIVE, (ENSURE ENLIVE) LIQD Take 237 mLs by mouth 2 (two) times daily between meals. 12/29/17   Eugenie Filler, MD  galantamine (RAZADYNE ER) 16 MG 24 hr capsule Take 16 mg by mouth daily with breakfast.    [provider]  magnesium oxide (MAG-OX) 400 (241.3 Mg) MG tablet Take 1 tablet (400 mg total) by mouth 2 (two) times daily. 02/28/18   Geradine Girt, DO  metoprolol succinate (TOPROL-XL) 100 MG 24 hr tablet Take 1 tablet (100 mg total) daily by mouth. Take with or immediately following a meal. 01/11/17   Nahser, Wonda Cheng, MD  nitroGLYCERIN (NITROSTAT) 0.4 MG SL tablet Place 1 tablet (0.4 mg total) under the tongue every 5 (five) minutes as needed for chest pain. 12/17/16   Erlene Quan, PA-C  ondansetron (ZOFRAN-ODT) 4 MG disintegrating tablet Take 4 mg by mouth every 8 (eight)  hours as needed for nausea or vomiting.    [provider]  polyethylene glycol (MIRALAX / GLYCOLAX) packet Take 17 g by mouth daily as needed for mild constipation. 02/28/18   Geradine Girt, DO  potassium chloride SA (K-DUR,KLOR-CON) 20 MEQ tablet Give potassium chloride 40 mEq p.o. x2 for a total of 80 meq on 02/21/2018, and give 40 mEq p.o. x1 on 02/22/2018 then 20 mEq p.o. x1 on 02/23/2018 02/21/18   Domenic Moras, PA-C  ranitidine (ZANTAC) 150 MG tablet Take 150 mg by mouth daily.    [provider]  Family History Family History  Problem Relation Age of Onset  . Pancreatic cancer Paternal Grandmother   . Heart disease Paternal Grandfather        both sides of the family  . Diabetes Paternal Aunt     Social History Social History   Tobacco Use  . Smoking status: Never Smoker  . Smokeless tobacco: Never Used  Substance Use Topics  . Alcohol use: Not Currently    Alcohol/week: 2.0 standard drinks    Types: 2 Glasses of wine per week    Comment: one per week  . Drug use: No     Allergies   Detrol [tolterodine]; Tranexamic acid; Xarelto [rivaroxaban]; and Pradaxa [dabigatran etexilate mesylate]   Review of Systems Review of Systems  Unable to perform ROS: Mental status change     Physical Exam Updated Vital Signs BP (!) 108/91   Pulse 86   Temp 97.7 F (36.5 C) (Oral)   Resp 15   Ht 5\' 6"  (1.676 m)   Wt 55 kg   SpO2 97%   BMI 19.57 kg/m   Physical Exam Vitals signs and nursing note reviewed.  Constitutional:      Appearance: She is well-developed.     Comments: Leaning to the right  HENT:     Head: Normocephalic and atraumatic.     Right Ear: External ear normal.     Left Ear: External ear normal.     Nose: Nose normal.  Eyes:     General:        Right eye: No discharge.        Left eye: No discharge.  Cardiovascular:     Rate and Rhythm: Normal rate and regular rhythm.     Heart sounds: Normal heart sounds.  Pulmonary:      Effort: Pulmonary effort is normal.     Breath sounds: Normal breath sounds.  Abdominal:     Palpations: Abdomen is soft.     Tenderness: There is no abdominal tenderness.  Musculoskeletal:     Comments: R forearm in cast  Skin:    General: Skin is warm and dry.  Neurological:     Mental Status: She is alert. She is disoriented.     Comments: CN 3-12 grossly intact. No slurred speech. 5/5 strength in both upper extremities, but a little weaker in RUE. Mild drift in RUE. BLE 4/5 strength, right weaker than left. Grossly normal sensation.   Psychiatric:        Mood and Affect: Mood is not anxious.      ED Treatments / Results  Labs (all labs ordered are listed, but only abnormal results are displayed) Labs Reviewed  APTT - Abnormal; Notable for the following components:      Result Value   aPTT 37 (*)    All other components within normal limits  COMPREHENSIVE METABOLIC PANEL - Abnormal; Notable for the following components:   Chloride 95 (*)    Glucose, Bld 106 (*)    Calcium 10.4 (*)    Total Bilirubin 1.3 (*)    GFR calc non Af Amer 55 (*)    All other components within normal limits  URINALYSIS, ROUTINE W REFLEX MICROSCOPIC - Abnormal; Notable for the following components:   APPearance HAZY (*)    All other components within normal limits  CBG MONITORING, ED - Abnormal; Notable for the following components:   Glucose-Capillary 102 (*)    All other components within normal limits  URINE CULTURE  ETHANOL  PROTIME-INR  CBC  DIFFERENTIAL  RAPID URINE DRUG SCREEN, HOSP PERFORMED  I-STAT TROPONIN, ED    EKG EKG Interpretation  Date/Time:  Tuesday April 01 2018 11:48:06 EST Ventricular Rate:  80 PR Interval:    QRS Duration: 105 QT Interval:  386 QTC Calculation: 446 R Axis:   59 Text Interpretation:  Atrial fibrillation RSR' in V1 or V2, right VCD or RVH Probable anteroseptal infarct, old no significant change since Dec 2019 Confirmed by Sherwood Gambler  [phone]) on 04/01/2018 12:59:27 PM   Radiology Ct Head Wo Contrast  Result Date: 04/01/2018 CLINICAL DATA:  Longstanding neuro deficit EXAM: CT HEAD WITHOUT CONTRAST TECHNIQUE: Contiguous axial images were obtained from the base of the skull through the vertex without intravenous contrast. COMPARISON:  02/21/2018 FINDINGS: Brain: Atrophic and chronic white matter ischemic changes are noted similar to that seen on the prior exam. No findings to suggest acute hemorrhage, acute infarction or space-occupying mass lesion are seen. Vascular: No hyperdense vessel or unexpected calcification. Skull: Normal. Negative for fracture or focal lesion. Sinuses/Orbits: No acute finding. Other: None. IMPRESSION: Chronic atrophic and ischemic changes without acute abnormality. Electronically Signed   By: Inez Catalina M.D.   On: 04/01/2018 15:13    Procedures Procedures (including critical care time)  Medications Ordered in ED Medications - No data to display   Initial Impression / Assessment and Plan / ED Course  I have reviewed the triage vital signs and the nursing notes.  Pertinent labs & imaging results that were available during my care of the patient were reviewed by me and considered in my medical decision making (see chart for details).     Patient has some mild weakness but also seems to be altered and family tells me she is not eating and drinking like normal.  Labs are overall unrevealing and the CT head is benign.  We will add on chest x-ray to help rule out occult pneumonia.  Otherwise, given this sharp decline and change in mental status I think she will need admission for further imaging and work-up such as MRI.  Hospitalist to admit.  Final Clinical Impressions(s) / ED Diagnoses   Final diagnoses:  Altered mental status, unspecified altered mental status type    ED Discharge Orders    None       Sherwood Gambler, MD 04/01/18 [phone]

## 2018-04-01 NOTE — H&P (Signed)
History and Physical    [name] PZW:[id number] DOB: [birth year] DOA: 04/01/2018  PCP: Crist Infante, MD   Patient coming from: ALF  I have personally briefly reviewed patient's old medical records in [place]  Chief Complaint: Weakness/altered mental status  HPI: [name] is a 84 y.o. female with medical history significant of dementia, chronic atrial fibrillation on Eliquis, hypertension, hyperlipidemia, coronary artery disease, right distal radius and ulna fracture currently with a cast, recurrent falls who was sent from her doctor's office with a concern for stroke.  Patient is confused from her dementia and does not provide much history.  Relatives/family members at bedside provides some history.  Patient was discharged to skilled nursing facility in early January 2020 for rehab for recurrent falls and right distal radius and ulna fracture.  Subsequently she was discharged back to assisted living facility almost a week ago.  Family members are concerned that her mental status has progressively declined with rapid decline in her oral intake and her weight as well.  Over the last couple of days, she was very slow to respond.  There was no known seizure-like activities or bowel bladder incontinence.  No recent fever, nausea or vomiting reported.  At her doctor's office, there was a question of right facial droop so she was sent to the ED for concern for stroke.  ED Course: CT head was negative for acute abnormality.  Hospitalist service was called to evaluate the patient.  Review of Systems: Could not be assessed because of patient's current confusional status Past Medical History:  Diagnosis Date  . Arthritis   . Atrial fibrillation ([place])   . Bladder polyps   . Cancer ([place])    stomach  . Cataracts, bilateral   . Cervical polyp   . Chronic kidney disease    urinary incont-  . Coronary artery disease    minimal  . Dysrhythmia     AF  hx     dr Acie Fredrickson  . GERD  (gastroesophageal reflux disease)   . Hiatal hernia   . Hyperlipidemia   . Hypertension   . Hypokalemia   . IBS (irritable bowel syndrome)   . IFG (impaired fasting glucose)   . Incontinence of urine   . MI, acute, non ST segment elevation ([place]) [id number].5,3614   cath  . OSA (obstructive sleep apnea)    hypoxia , not CO 2 retainer,   . OSA on CPAP 06/16/2013    Sleep study 1- 11-15 with an AHI of 61 , now titrated to CPAP 9 cm water with 2 cm EPR.   . Osteopenia   . Stromal tumor of the stomach [facility])    cancer    Past Surgical History:  Procedure Laterality Date  . ANKLE FRACTURE SURGERY  2006   right, plate   . CARDIAC CATHETERIZATION  12/07/20210   Dr.Nahser,minor cad  . COLONOSCOPY    . COLONOSCOPY N/A Sep 11, 202016   Procedure: COLONOSCOPY;  Surgeon: Inda Castle, MD;  Location: [facility];  Service: Endoscopy;  Laterality: N/A;  . ENTEROSCOPY N/A Sep 11, 202016   Procedure: ENTEROSCOPY;  Surgeon: Inda Castle, MD;  Location: [facility] ENDOSCOPY;  Service: Endoscopy;  Laterality: N/A;  . EYE SURGERY     both cataracts  . KNEE ARTHROSCOPY Right 05/28/2012   Procedure: RIGHT KNEE ARTHROSCOPY PARTIAL MEDIAL AND LATERAL MENISECTOMY WITH CHONDROPLASTY;  Surgeon: Alta Corning, MD;  Location: [facility];  Service: Orthopedics;  Laterality: Right;  . stromal stomach  tumor  03/26/2007   resection  . TOTAL KNEE ARTHROPLASTY Right 08/10/2013   Procedure: TOTAL KNEE ARTHROPLASTY;  Surgeon: Alta Corning, MD;  Location: [facility];  Service: Orthopedics;  Laterality: Right;  . UPPER GI ENDOSCOPY     Social history  reports that she has never smoked. She has never used smokeless tobacco. She reports previous alcohol use of about 2.0 standard drinks of alcohol per week. She reports that she does not use drugs.  -Currently lives in an assisted living facility  Allergies  Allergen Reactions  . Detrol [Tolterodine] Other (See Comments)    Critical reaction-per MD office  . Tranexamic Acid  Other (See Comments)    Patient ineligible for Tranexamic acid due to hx of NSTEMI and CAD.  [name] Xarelto [Rivaroxaban] Other (See Comments)    Critical reaction  . Pradaxa [Dabigatran Etexilate Mesylate] Nausea And Vomiting and Other (See Comments)    Moderate reaction-per MD    Family History  Problem Relation Age of Onset  . Pancreatic cancer Paternal Grandmother   . Heart disease Paternal Grandfather        both sides of the family  . Diabetes Paternal Aunt     Prior to Admission medications   Medication Sig Start Date End Date Taking? Authorizing Provider  albuterol (PROVENTIL) (2.5 MG/3ML) 0.083% nebulizer solution Take 3 mLs (2.5 mg total) by nebulization every 2 (two) hours as needed for wheezing. 02/28/18   Geradine Girt, DO  apixaban (ELIQUIS) 2.5 MG TABS tablet Take 1 tablet (2.5 mg total) by mouth 2 (two) times daily. 12/29/17   Eugenie Filler, MD  atorvastatin (LIPITOR) 40 MG tablet Take 20 mg by mouth daily. 1/2 tablet by mouth daily     [provider]  bismuth subsalicylate (PEPTO BISMOL) 262 MG/15ML suspension Take 30 mLs by mouth every 6 (six) hours as needed (vomitting).    [provider]  calcium carbonate (TUMS - DOSED IN MG ELEMENTAL CALCIUM) 500 MG chewable tablet Chew 1 tablet by mouth 3 (three) times daily as needed for indigestion or heartburn.    [provider]  DULoxetine (CYMBALTA) 20 MG capsule TAKE 1 CAPSULE AT NIGHT FOR 7 NIGHTS THEN TAKE 2 CAPSULES AT NIGHT Patient taking differently: Take 40 mg by mouth at bedtime.  03/06/18   Magnus Sinning, MD  esomeprazole (NEXIUM) 40 MG packet Take 40 mg by mouth daily. DISSOLVE CONTENTS OF 1 PACKET IN 15ML OF WATER AND DRINK EVERY DAY    [provider]  feeding supplement, ENSURE ENLIVE, (ENSURE ENLIVE) LIQD Take 237 mLs by mouth 2 (two) times daily between meals. 12/29/17   Eugenie Filler, MD  galantamine (RAZADYNE ER) 16 MG 24 hr capsule Take 16 mg by mouth daily with  breakfast.    [provider]  magnesium oxide (MAG-OX) 400 (241.3 Mg) MG tablet Take 1 tablet (400 mg total) by mouth 2 (two) times daily. 02/28/18   Geradine Girt, DO  metoprolol succinate (TOPROL-XL) 100 MG 24 hr tablet Take 1 tablet (100 mg total) daily by mouth. Take with or immediately following a meal. 01/11/17   Nahser, Wonda Cheng, MD  nitroGLYCERIN (NITROSTAT) 0.4 MG SL tablet Place 1 tablet (0.4 mg total) under the tongue every 5 (five) minutes as needed for chest pain. 12/17/16   Erlene Quan, PA-C  ondansetron (ZOFRAN-ODT) 4 MG disintegrating tablet Take 4 mg by mouth every 8 (eight) hours as needed for nausea or vomiting.    [provider]  polyethylene glycol (MIRALAX / GLYCOLAX) packet Take 17 g by mouth daily as needed for mild constipation. 02/28/18   Geradine Girt, DO  potassium chloride SA (K-DUR,KLOR-CON) 20 MEQ tablet Give potassium chloride 40 mEq p.o. x2 for a total of 80 meq on 02/21/2018, and give 40 mEq p.o. x1 on 02/22/2018 then 20 mEq p.o. x1 on 02/23/2018 02/21/18   Domenic Moras, PA-C  ranitidine (ZANTAC) 150 MG tablet Take 150 mg by mouth daily.    [provider]    Physical Exam: Vitals:   04/01/18 1207 04/01/18 1400 04/01/18 1500 04/01/18 1515  BP: (!) 143/84 108/62 (!) 108/91   Pulse: 79 86    Resp: 17 18 15    Temp: 97.7 F (36.5 C)     TempSrc: Oral     SpO2: 97% 97%  97%  Weight:      Height:        Constitutional: Very thinly built elderly female lying in bed.  NAD, calm, comfortable Vitals:   04/01/18 1207 04/01/18 1400 04/01/18 1500 04/01/18 1515  BP: (!) 143/84 108/62 (!) 108/91   Pulse: 79 86    Resp: 17 18 15    Temp: 97.7 F (36.5 C)     TempSrc: Oral     SpO2: 97% 97%  97%  Weight:      Height:       Eyes: PERRL, lids and conjunctivae normal ENMT: Mucous membranes are dry. Posterior pharynx clear of any exudate or lesions. Neck: normal, supple, no masses, no thyromegaly Respiratory: bilateral decreased  breath sounds at bases, no wheezing, no crackles. Normal respiratory effort. No accessory muscle use.  Cardiovascular: S1 S2 positive, rate controlled. No extremity edema. 2+ pedal pulses.  Abdomen: no tenderness, no masses palpated. No hepatosplenomegaly. Bowel sounds positive.  Musculoskeletal: no clubbing / cyanosis.  Right forearm and wrist is in a cast. Skin: no rashes, lesions, ulcers. No induration Neurologic: CN 2-12 grossly intact.  No slurred speech, no facial droop.  Follows command intermittently.  Moving extremities.  Bilateral lower extremity 4 out of 5 strength.  Right upper extremity slightly weaker secondary to cast.  No focal neurologic deficits.  Confused Psychiatric: Could not be assessed because of confusional state   Labs on Admission: I have personally reviewed following labs and imaging studies  CBC: Recent Labs  Lab 04/01/18 1205  WBC 6.9  NEUTROABS 4.6  HGB 14.5  HCT 45.1  MCV 89.8  PLT 885   Basic Metabolic Panel: Recent Labs  Lab 04/01/18 1205  NA 136  K 3.8  CL 95*  CO2 28  GLUCOSE 106*  BUN 15  CREATININE 0.97  CALCIUM 10.4*   GFR: Estimated Creatinine Clearance: 39.5 mL/min (by C-G formula based on SCr of 0.97 mg/dL). Liver Function Tests: Recent Labs  Lab 04/01/18 1205  AST 21  ALT 9  ALKPHOS 82  BILITOT 1.3*  PROT 7.1  ALBUMIN 3.6   No results for input(s): LIPASE, AMYLASE in the last 168 hours. No results for input(s): AMMONIA in the last 168 hours. Coagulation Profile: Recent Labs  Lab 04/01/18 1205  INR 1.21   Cardiac Enzymes: No results for input(s): CKTOTAL, CKMB, CKMBINDEX, TROPONINI in the last 168 hours. BNP (last 3 results) No results for input(s): PROBNP in the last 8760 hours. HbA1C: No results for input(s): HGBA1C in the last 72 hours. CBG: Recent Labs  Lab 04/01/18 1158  GLUCAP 102*   Lipid Profile: No results for input(s): CHOL, HDL,  LDLCALC, TRIG, CHOLHDL, LDLDIRECT in the last 72 hours. Thyroid  Function Tests: No results for input(s): TSH, T4TOTAL, FREET4, T3FREE, THYROIDAB in the last 72 hours. Anemia Panel: No results for input(s): VITAMINB12, FOLATE, FERRITIN, TIBC, IRON, RETICCTPCT in the last 72 hours. Urine analysis:    Component Value Date/Time   COLORURINE YELLOW 04/01/2018 1525   APPEARANCEUR HAZY (A) 04/01/2018 1525   LABSPEC 1.017 04/01/2018 1525   PHURINE 6.0 04/01/2018 1525   GLUCOSEU NEGATIVE 04/01/2018 1525   HGBUR NEGATIVE 04/01/2018 1525   BILIRUBINUR NEGATIVE 04/01/2018 1525   KETONESUR NEGATIVE 04/01/2018 1525   PROTEINUR NEGATIVE 04/01/2018 1525   UROBILINOGEN 1.0 09/08/2007 1024   NITRITE NEGATIVE 04/01/2018 1525   LEUKOCYTESUR NEGATIVE 04/01/2018 1525    Radiological Exams on Admission: Ct Head Wo Contrast  Result Date: 04/01/2018 CLINICAL DATA:  Longstanding neuro deficit EXAM: CT HEAD WITHOUT CONTRAST TECHNIQUE: Contiguous axial images were obtained from the base of the skull through the vertex without intravenous contrast. COMPARISON:  02/21/2018 FINDINGS: Brain: Atrophic and chronic white matter ischemic changes are noted similar to that seen on the prior exam. No findings to suggest acute hemorrhage, acute infarction or space-occupying mass lesion are seen. Vascular: No hyperdense vessel or unexpected calcification. Skull: Normal. Negative for fracture or focal lesion. Sinuses/Orbits: No acute finding. Other: None. IMPRESSION: Chronic atrophic and ischemic changes without acute abnormality. Electronically Signed   By: Inez Catalina M.D.   On: 04/01/2018 15:13   Dg Chest Portable 1 View  Result Date: 04/01/2018 CLINICAL DATA:  Altered mental status EXAM: PORTABLE CHEST 1 VIEW COMPARISON:  02/21/2018 and prior chest radiographs FINDINGS: Cardiomegaly again noted. There is no evidence of focal airspace disease, pulmonary edema, suspicious pulmonary nodule/mass, pleural effusion, or pneumothorax. No acute bony abnormalities are identified. IMPRESSION:  Cardiomegaly without evidence of acute cardiopulmonary disease. Electronically Signed   By: Margarette Canada M.D.   On: 04/01/2018 16:41    Assessment/Plan Principal Problem:   Altered mental status, unspecified Active Problems:   Dyslipidemia   Essential hypertension   Chronic atrial fibrillation   Dementia (HCC)   Altered mental status in a patient with history of dementia -There was a question of facial droop in the doctor's office which was concerning for stroke.  Rule out stroke. -CT of the head was negative for abnormality.  Will get MRI of the brain.  2D echo.  Ultrasound of carotids.  If MRI is positive for stroke, will request neurology evaluation. -PT/OT/SLP evaluation -EEG -Vitamin B12/folate/TSH/ammonia in a.m. -Fall precautions. -Patient's overall general medical condition has been declining pretty quickly recently as per the family.  Her appetite is poor.  They do not want feeding tube.  We will get palliative care evaluation as well.  Chronic atrial fibrillation -Currently rate controlled.  Continue Eliquis.  Hypertension -Monitor blood pressure.  Hold antihypertensives for now.  Dyslipidemia -Continue statin  Recent right distal radius and ulnar fracture -Currently has a cast.  Outpatient follow-up with orthopedics   DVT prophylaxis: Eliquis Code Status: DNR.  Discussed with family members at bedside including the POA Family Communication: Discussed with POA at bedside Disposition Plan: ALF versus SNF pending clinical improvement Consults called: None Admission status: Observation/telemetry  Severity of Illness: The appropriate patient status for this patient is OBSERVATION. Observation status is judged to be reasonable and necessary in order to provide the required intensity of service to ensure the patient's safety. The patient's presenting symptoms, physical exam findings, and initial radiographic and laboratory data in the context  of their medical condition  is felt to place them at decreased risk for further clinical deterioration. Furthermore, it is anticipated that the patient will be medically stable for discharge from the hospital within 2 midnights of admission. The following factors support the patient status of observation.   " The patient's presenting symptoms include altered mental status. " The physical exam findings include confusion. " The initial radiographic and laboratory data are CT of the brain was negative for acute abnormality.     Aline August MD [facility] Pager [phone]  If 7PM-7AM, please contact night-coverage [email or web address] Password [facility]  04/01/2018, 4:44 PM

## 2018-04-01 NOTE — ED Notes (Addendum)
This RN paged MD Starla Link to inform about bp of 196/108, MD informed this RN that he does not want to treat hypertension due to a possible stoke but that he would put in PRN antihypertensives so pt is a candidate for tele bed.

## 2018-04-02 ENCOUNTER — Observation Stay (HOSPITAL_COMMUNITY): Payer: Medicare Other

## 2018-04-02 ENCOUNTER — Inpatient Hospital Stay (HOSPITAL_COMMUNITY): Payer: Medicare Other

## 2018-04-02 ENCOUNTER — Other Ambulatory Visit: Payer: Self-pay

## 2018-04-02 ENCOUNTER — Ambulatory Visit (HOSPITAL_COMMUNITY): Payer: Medicare Other

## 2018-04-02 ENCOUNTER — Encounter (HOSPITAL_COMMUNITY): Payer: Medicare Other

## 2018-04-02 DIAGNOSIS — R296 Repeated falls: Secondary | ICD-10-CM | POA: Diagnosis present

## 2018-04-02 DIAGNOSIS — G4733 Obstructive sleep apnea (adult) (pediatric): Secondary | ICD-10-CM | POA: Diagnosis present

## 2018-04-02 DIAGNOSIS — Z7901 Long term (current) use of anticoagulants: Secondary | ICD-10-CM | POA: Diagnosis not present

## 2018-04-02 DIAGNOSIS — I2510 Atherosclerotic heart disease of native coronary artery without angina pectoris: Secondary | ICD-10-CM | POA: Diagnosis present

## 2018-04-02 DIAGNOSIS — I361 Nonrheumatic tricuspid (valve) insufficiency: Secondary | ICD-10-CM | POA: Diagnosis not present

## 2018-04-02 DIAGNOSIS — R4182 Altered mental status, unspecified: Secondary | ICD-10-CM

## 2018-04-02 DIAGNOSIS — I10 Essential (primary) hypertension: Secondary | ICD-10-CM | POA: Diagnosis not present

## 2018-04-02 DIAGNOSIS — S52501D Unspecified fracture of the lower end of right radius, subsequent encounter for closed fracture with routine healing: Secondary | ICD-10-CM | POA: Diagnosis not present

## 2018-04-02 DIAGNOSIS — I482 Chronic atrial fibrillation, unspecified: Secondary | ICD-10-CM | POA: Diagnosis not present

## 2018-04-02 DIAGNOSIS — I6381 Other cerebral infarction due to occlusion or stenosis of small artery: Secondary | ICD-10-CM | POA: Diagnosis present

## 2018-04-02 DIAGNOSIS — I370 Nonrheumatic pulmonary valve stenosis: Secondary | ICD-10-CM

## 2018-04-02 DIAGNOSIS — F05 Delirium due to known physiological condition: Secondary | ICD-10-CM | POA: Diagnosis present

## 2018-04-02 DIAGNOSIS — F0390 Unspecified dementia without behavioral disturbance: Secondary | ICD-10-CM

## 2018-04-02 DIAGNOSIS — Z66 Do not resuscitate: Secondary | ICD-10-CM | POA: Diagnosis present

## 2018-04-02 DIAGNOSIS — I5032 Chronic diastolic (congestive) heart failure: Secondary | ICD-10-CM | POA: Diagnosis present

## 2018-04-02 DIAGNOSIS — Z79899 Other long term (current) drug therapy: Secondary | ICD-10-CM | POA: Diagnosis not present

## 2018-04-02 DIAGNOSIS — I639 Cerebral infarction, unspecified: Secondary | ICD-10-CM | POA: Diagnosis not present

## 2018-04-02 DIAGNOSIS — W19XXXD Unspecified fall, subsequent encounter: Secondary | ICD-10-CM | POA: Diagnosis present

## 2018-04-02 DIAGNOSIS — F0150 Vascular dementia without behavioral disturbance: Secondary | ICD-10-CM

## 2018-04-02 DIAGNOSIS — I252 Old myocardial infarction: Secondary | ICD-10-CM | POA: Diagnosis not present

## 2018-04-02 DIAGNOSIS — R29810 Facial weakness: Secondary | ICD-10-CM | POA: Diagnosis present

## 2018-04-02 DIAGNOSIS — M1990 Unspecified osteoarthritis, unspecified site: Secondary | ICD-10-CM | POA: Diagnosis present

## 2018-04-02 DIAGNOSIS — Z800 Family history of malignant neoplasm of digestive organs: Secondary | ICD-10-CM | POA: Diagnosis not present

## 2018-04-02 DIAGNOSIS — K589 Irritable bowel syndrome without diarrhea: Secondary | ICD-10-CM | POA: Diagnosis present

## 2018-04-02 DIAGNOSIS — E785 Hyperlipidemia, unspecified: Secondary | ICD-10-CM | POA: Diagnosis not present

## 2018-04-02 DIAGNOSIS — Z96651 Presence of right artificial knee joint: Secondary | ICD-10-CM | POA: Diagnosis present

## 2018-04-02 DIAGNOSIS — I110 Hypertensive heart disease with heart failure: Secondary | ICD-10-CM | POA: Diagnosis present

## 2018-04-02 DIAGNOSIS — Z515 Encounter for palliative care: Secondary | ICD-10-CM | POA: Diagnosis not present

## 2018-04-02 DIAGNOSIS — K219 Gastro-esophageal reflux disease without esophagitis: Secondary | ICD-10-CM | POA: Diagnosis present

## 2018-04-02 DIAGNOSIS — F419 Anxiety disorder, unspecified: Secondary | ICD-10-CM | POA: Diagnosis present

## 2018-04-02 DIAGNOSIS — Z7189 Other specified counseling: Secondary | ICD-10-CM | POA: Diagnosis not present

## 2018-04-02 DIAGNOSIS — Z888 Allergy status to other drugs, medicaments and biological substances status: Secondary | ICD-10-CM | POA: Diagnosis not present

## 2018-04-02 DIAGNOSIS — S52201D Unspecified fracture of shaft of right ulna, subsequent encounter for closed fracture with routine healing: Secondary | ICD-10-CM | POA: Diagnosis not present

## 2018-04-02 DIAGNOSIS — R400 Somnolence: Secondary | ICD-10-CM | POA: Diagnosis not present

## 2018-04-02 LAB — CBC WITH DIFFERENTIAL/PLATELET
Abs Immature Granulocytes: 0.02 10*3/uL (ref 0.00–0.07)
Basophils Absolute: 0.1 10*3/uL (ref 0.0–0.1)
Basophils Relative: 1 %
Eosinophils Absolute: 0.1 10*3/uL (ref 0.0–0.5)
Eosinophils Relative: 2 %
HCT: 41.9 % (ref 36.0–46.0)
HEMOGLOBIN: 13.7 g/dL (ref 12.0–15.0)
Immature Granulocytes: 0 %
Lymphocytes Relative: 21 %
Lymphs Abs: 1.1 10*3/uL (ref 0.7–4.0)
MCH: 28.5 pg (ref 26.0–34.0)
MCHC: 32.7 g/dL (ref 30.0–36.0)
MCV: 87.1 fL (ref 80.0–100.0)
MONO ABS: 0.7 10*3/uL (ref 0.1–1.0)
Monocytes Relative: 14 %
Neutro Abs: 3.2 10*3/uL (ref 1.7–7.7)
Neutrophils Relative %: 62 %
Platelets: 225 10*3/uL (ref 150–400)
RBC: 4.81 MIL/uL (ref 3.87–5.11)
RDW: 12.4 % (ref 11.5–15.5)
WBC: 5.2 10*3/uL (ref 4.0–10.5)
nRBC: 0 % (ref 0.0–0.2)

## 2018-04-02 LAB — LIPID PANEL
Cholesterol: 144 mg/dL (ref 0–200)
HDL: 35 mg/dL — ABNORMAL LOW (ref >40)
LDL CALC: 98 mg/dL (ref 0–99)
Total CHOL/HDL Ratio: 4.1 RATIO
Triglycerides: 54 mg/dL (ref <150)
VLDL: 11 mg/dL (ref 0–40)

## 2018-04-02 LAB — COMPREHENSIVE METABOLIC PANEL
ALT: 10 U/L (ref 0–44)
AST: 18 U/L (ref 15–41)
Albumin: 3.3 g/dL — ABNORMAL LOW (ref 3.5–5.0)
Alkaline Phosphatase: 75 U/L (ref 38–126)
Anion gap: 13 (ref 5–15)
BUN: 10 mg/dL (ref 8–23)
CALCIUM: 9.8 mg/dL (ref 8.9–10.3)
CO2: 27 mmol/L (ref 22–32)
Chloride: 96 mmol/L — ABNORMAL LOW (ref 98–111)
Creatinine, Ser: 0.63 mg/dL (ref 0.44–1.00)
GFR calc Af Amer: >60 mL/min (ref >60)
GFR calc non Af Amer: >60 mL/min (ref >60)
Glucose, Bld: 96 mg/dL (ref 70–99)
Potassium: 3.2 mmol/L — ABNORMAL LOW (ref 3.5–5.1)
Sodium: 136 mmol/L (ref 135–145)
Total Bilirubin: 1.6 mg/dL — ABNORMAL HIGH (ref 0.3–1.2)
Total Protein: 6.5 g/dL (ref 6.5–8.1)

## 2018-04-02 LAB — ECHOCARDIOGRAM COMPLETE
HEIGHTINCHES: 66 in
Weight: 1940.05 oz

## 2018-04-02 LAB — TSH: TSH: 1.398 u[IU]/mL (ref 0.350–4.500)

## 2018-04-02 LAB — URINE CULTURE: Culture: NO GROWTH

## 2018-04-02 LAB — VITAMIN B12: Vitamin B-12: 450 pg/mL (ref 180–914)

## 2018-04-02 LAB — HEMOGLOBIN A1C
Hgb A1c MFr Bld: 5.4 % (ref 4.8–5.6)
Mean Plasma Glucose: 108.28 mg/dL

## 2018-04-02 LAB — AMMONIA: Ammonia: 18 umol/L (ref 9–35)

## 2018-04-02 MED ORDER — POTASSIUM CHLORIDE CRYS ER 20 MEQ PO TBCR
40.0000 meq | EXTENDED_RELEASE_TABLET | Freq: Once | ORAL | Status: AC
  Administered 2018-04-02: 40 meq via ORAL
  Filled 2018-04-02: qty 2

## 2018-04-02 MED ORDER — ENSURE ENLIVE PO LIQD
237.0000 mL | Freq: Two times a day (BID) | ORAL | Status: DC
  Administered 2018-04-02 – 2018-04-04 (×4): 237 mL via ORAL

## 2018-04-02 MED ORDER — ONDANSETRON HCL 4 MG/2ML IJ SOLN
4.0000 mg | Freq: Once | INTRAMUSCULAR | Status: AC
  Administered 2018-04-02: 4 mg via INTRAVENOUS
  Filled 2018-04-02: qty 2

## 2018-04-02 NOTE — Evaluation (Signed)
Physical Therapy Evaluation Patient Details Name: [name] MRN: [id number] DOB: Dec 28, 1934 Today's Date: 04/02/2018   History of Present Illness  [name] is an 84 y.o. female with medical history significant of dementia, chronic atrial fibrillation on Eliquis, hypertension, hyperlipidemia, coronary artery disease, right distal radius and ulna fracture currently with a cast, recurrent falls who was sent from her doctor's office with a concern for stroke.  MRI revealed an acute infarct of the R putamen.    Clinical Impression  Pt presented supine in bed with HOB elevated, awake and willing to participate in therapy session. Prior to admission, pt reported that she ambulated independently. However, pt very confused throughout and with dementia at baseline. No family/caregivers present to provide any reliable history information. Pt currently requires min-mod A x2 for bed mobility and min A to maintain upright sitting at EOB. Deferred OOB mobility at this time secondary to elevated BP and HR (see below). Pt would continue to benefit from skilled physical therapy services at this time while admitted and after d/c to address the below listed limitations in order to improve overall safety and independence with functional mobility. Pt also with reported dizziness that did not dissipate with time.  Prior to session: 153/85 (103) Sitting: 173/134 (144) Sitting after ~2 mins: 169/102 (122) Supine at end of session: 166/107 (123)     Follow Up Recommendations SNF    Equipment Recommendations  None recommended by PT    Recommendations for Other Services       Precautions / Restrictions Precautions Precautions: Fall Restrictions Weight Bearing Restrictions: (no WB'ing orders, assumed NWB through R wrist/hand)      Mobility  Bed Mobility Overal bed mobility: Needs Assistance Bed Mobility: Supine to Sit;Sit to Supine     Supine to sit: +2 for physical assistance;Mod assist Sit to  supine: Min assist   General bed mobility comments: cueing for technique, pt able to initiate bed mobility with cueing, advancing bilateral LEs towards EOB; assistance needed for trunk elevation and to return bilateral LEs back onto bed  Transfers Overall transfer level: Needs assistance               General transfer comment: deferred secondary to increase in BP (sitting: 173/134 (144) and 169/102 (122) after sitting ~2 mins) and HR fluctuating (increasing to as high as 127 bpm in sitting)  Ambulation/Gait                Stairs            Wheelchair Mobility    Modified Rankin (Stroke Patients Only) Modified Rankin (Stroke Patients Only) Pre-Morbid Rankin Score: Moderate disability Modified Rankin: Moderately severe disability     Balance Overall balance assessment: Needs assistance Sitting-balance support: Feet supported Sitting balance-Leahy Scale: Poor Sitting balance - Comments: close min guard with occasional min A to maintain upright sitting at EOB Postural control: Posterior lean                                   Pertinent Vitals/Pain Pain Assessment: No/denies pain    Home Living Family/patient expects to be discharged to:: Skilled nursing facility   Available Help at Discharge: [place] Type of Home: [place]: Kasandra Knudsen - single point;Shower seat - built in;Grab bars - toilet;Grab bars - tub/shower;Hand held shower head  Prior Function Level of Independence: Needs assistance   Gait / Transfers Assistance Needed: pt reported that she ambulates independently, hx of multiple falls  ADL's / Homemaking Assistance Needed: assist with housekeeping/meals at ILF  Comments: unsure how accurate information is as pt was confused throughout and with dementia at baseline, no family/caregivers present     Hand Dominance   Dominant Hand: Right    Extremity/Trunk Assessment    Upper Extremity Assessment Upper Extremity Assessment: Generalized weakness;RUE deficits/detail RUE Deficits / Details: hard cast in place on wrist and forearm RUE: Unable to fully assess due to immobilization    Lower Extremity Assessment Lower Extremity Assessment: Generalized weakness    Cervical / Trunk Assessment Cervical / Trunk Assessment: Kyphotic  Communication   Communication: Expressive difficulties;Receptive difficulties  Cognition Arousal/Alertness: Awake/alert Behavior During Therapy: Flat affect Overall Cognitive Status: Impaired/Different from baseline Area of Impairment: Orientation;Memory;Following commands;Awareness;Safety/judgement                 Orientation Level: Disoriented to;Time;Situation   Memory: Decreased short-term memory Following Commands: Follows one step commands inconsistently Safety/Judgement: Decreased awareness of safety Awareness: Intellectual   General Comments: Pt following directions 50% of the time, slow processing      General Comments General comments (skin integrity, edema, etc.): Pt had intermittent moments of lucidity during session     Exercises     Assessment/Plan    PT Assessment Patient needs continued PT services  PT Problem List Decreased strength;Decreased activity tolerance;Decreased balance;Decreased mobility;Decreased coordination;Decreased cognition;Decreased knowledge of use of DME;Decreased knowledge of precautions;Decreased safety awareness;Cardiopulmonary status limiting activity       PT Treatment Interventions DME instruction;Gait training;Stair training;Functional mobility training;Therapeutic activities;Therapeutic exercise;Balance training;Neuromuscular re-education;Cognitive remediation;Patient/family education    PT Goals (Current goals can be found in the Care Plan section)  Acute Rehab PT Goals Patient Stated Goal: none stated PT Goal Formulation: Patient unable to participate in goal  setting Time For Goal Achievement: 04/16/18 Potential to Achieve Goals: Fair    Frequency Min 3X/week   Barriers to discharge        Co-evaluation PT/OT/SLP Co-Evaluation/Treatment: Yes Reason for Co-Treatment: For patient/therapist safety;To address functional/ADL transfers PT goals addressed during session: Mobility/safety with mobility;Balance;Strengthening/ROM OT goals addressed during session: ADL's and self-care       AM-PAC PT "6 Clicks" Mobility  Outcome Measure Help needed turning from your back to your side while in a flat bed without using bedrails?: A Lot Help needed moving from lying on your back to sitting on the side of a flat bed without using bedrails?: A Lot Help needed moving to and from a bed to a chair (including a wheelchair)?: A Lot Help needed standing up from a chair using your arms (e.g., wheelchair or bedside chair)?: A Lot Help needed to walk in hospital room?: A Lot Help needed climbing 3-5 steps with a railing? : Total 6 Click Score: 11    End of Session   Activity Tolerance: Patient limited by fatigue Patient left: in bed;with call bell/phone within reach;with bed alarm set Nurse Communication: Mobility status;Other (comment)(elevated BP and HR) PT Visit Diagnosis: Other abnormalities of gait and mobility (R26.89);Other symptoms and signs involving the nervous system (B14.782)    Time: 9562-1308 PT Time Calculation (min) (ACUTE ONLY): 22 min   Charges:   PT Evaluation $PT Eval Moderate Complexity: 1 Mod          Sherie Don, PT, DPT  Acute Rehabilitation Services Pager [phone] Office [phone]    Anderson Malta  M Keithan Dileonardo 04/02/2018, 10:06 AM

## 2018-04-02 NOTE — Consult Note (Addendum)
Referring Physician: Dr. Wyonia Hough    Chief Complaint: Confusion  HPI: [name] is an 84 y.o. female with dementia, chronic atrial fibrillation on Eliquis, recurrent falls, right distal radius and ulnar fracture with cast, HTN, HLD and CAD, who initially presented to the hospital with complaints of progressive decline in mental status, oral intake and weight. Over the past 2 days she had been very slow to respond. No seizure-like events were reported. She was sent to the ED from her 89 office as there was a question of a right facial droop with concern for stroke. An MRI was obtained, revealing an acute to subacute lacunar infarction within the right putamen.    Past Medical History:  Diagnosis Date  . Arthritis   . Atrial fibrillation ([place])   . Bladder polyps   . Cancer ([place])    stomach  . Cataracts, bilateral   . Cervical polyp   . Chronic kidney disease    urinary incont-  . Coronary artery disease    minimal  . Dysrhythmia     AF  hx     dr Acie Fredrickson  . GERD (gastroesophageal reflux disease)   . Hiatal hernia   . Hyperlipidemia   . Hypertension   . Hypokalemia   . IBS (irritable bowel syndrome)   . IFG (impaired fasting glucose)   . Incontinence of urine   . MI, acute, non ST segment elevation ([place]) DTO.[id number]   cath  . OSA (obstructive sleep apnea)    hypoxia , not CO 2 retainer,   . OSA on CPAP 06/16/2013    Sleep study 1- 11-15 with an AHI of 61 , now titrated to CPAP 9 cm water with 2 cm EPR.   . Osteopenia   . Stromal tumor of the stomach [facility])    cancer    Past Surgical History:  Procedure Laterality Date  . ANKLE FRACTURE SURGERY  2006   right, plate   . CARDIAC CATHETERIZATION  12/07/20210   Dr.Nahser,minor cad  . COLONOSCOPY    . COLONOSCOPY N/A 12-05-2014   Procedure: COLONOSCOPY;  Surgeon: Inda Castle, MD;  Location: [facility];  Service: Endoscopy;  Laterality: N/A;  . ENTEROSCOPY N/A 12-05-2014   Procedure: ENTEROSCOPY;  Surgeon:  Inda Castle, MD;  Location: [facility] ENDOSCOPY;  Service: Endoscopy;  Laterality: N/A;  . EYE SURGERY     both cataracts  . KNEE ARTHROSCOPY Right 05/28/2012   Procedure: RIGHT KNEE ARTHROSCOPY PARTIAL MEDIAL AND LATERAL MENISECTOMY WITH CHONDROPLASTY;  Surgeon: Alta Corning, MD;  Location: [place];  Service: Orthopedics;  Laterality: Right;  . stromal stomach  tumor  03/26/2007   resection  . TOTAL KNEE ARTHROPLASTY Right 08/10/2013   Procedure: TOTAL KNEE ARTHROPLASTY;  Surgeon: Alta Corning, MD;  Location: [place];  Service: Orthopedics;  Laterality: Right;  . UPPER GI ENDOSCOPY      Family History  Problem Relation Age of Onset  . Pancreatic cancer Paternal Grandmother   . Heart disease Paternal Grandfather        both sides of the family  . Diabetes Paternal Aunt    Social History:  reports that she has never smoked. She has never used smokeless tobacco. She reports previous alcohol use of about 2.0 standard drinks of alcohol per week. She reports that she does not use drugs.  Allergies:  Allergies  Allergen Reactions  . Detrol [Tolterodine] Other (See Comments)    Critical reaction-per MD office  . Tranexamic Acid  Other (See Comments)    Patient ineligible for Tranexamic acid due to hx of NSTEMI and CAD.  [name] Xarelto [Rivaroxaban] Other (See Comments)    Critical reaction  . Pradaxa [Dabigatran Etexilate Mesylate] Nausea And Vomiting and Other (See Comments)    Moderate reaction-per MD    Medications:  Prior to Admission:  Medications Prior to Admission  Medication Sig Dispense Refill Last Dose  . albuterol (PROVENTIL) (2.5 MG/3ML) 0.083% nebulizer solution Take 3 mLs (2.5 mg total) by nebulization every 2 (two) hours as needed for wheezing. (Patient taking differently: Take 2.5 mg by nebulization 2 (two) times daily as needed for wheezing. ) 75 mL 12 unk at unk  . apixaban (ELIQUIS) 2.5 MG TABS tablet Take 1 tablet (2.5 mg total) by mouth 2 (two) times daily.  60 tablet 0 03/31/2018 at 2000  . atorvastatin (LIPITOR) 20 MG tablet Take 20 mg by mouth every evening.   03/31/2018 at 1700  . bismuth subsalicylate (PEPTO BISMOL) 262 MG/15ML suspension Take 30 mLs by mouth every 6 (six) hours as needed (for nausea, indigestion, or upset stomach).    unk at unk  . calcium carbonate (TUMS - DOSED IN MG ELEMENTAL CALCIUM) 500 MG chewable tablet Chew 1 tablet by mouth every 8 (eight) hours as needed for indigestion or heartburn.    unk at [employer]  . DULoxetine (CYMBALTA) 20 MG capsule TAKE 1 CAPSULE AT NIGHT FOR 7 NIGHTS THEN TAKE 2 CAPSULES AT NIGHT (Patient taking differently: Take 40 mg by mouth at bedtime. ) 180 capsule 0 03/31/2018 at 2000  . esomeprazole (NEXIUM) 40 MG packet Take 40 mg by mouth daily. DISSOLVE CONTENTS OF 1 PACKET IN 15ML OF WATER AND DRINK EVERY DAY   04/01/2018 at 0600  . feeding supplement, ENSURE ENLIVE, (ENSURE ENLIVE) LIQD Take 237 mLs by mouth 2 (two) times daily between meals. (Patient taking differently: Take 118.5 mLs by mouth 2 (two) times daily. ) 237 mL 0 03/31/2018 at 2000  . galantamine (RAZADYNE ER) 16 MG 24 hr capsule Take 16 mg by mouth daily with breakfast.   03/31/2018 at 0800  . magnesium oxide (MAG-OX) 400 (241.3 Mg) MG tablet Take 1 tablet (400 mg total) by mouth 2 (two) times daily.   03/31/2018 at 2000  . metoprolol succinate (TOPROL-XL) 100 MG 24 hr tablet Take 1 tablet (100 mg total) daily by mouth. Take with or immediately following a meal. 30 tablet 6 03/31/2018 at 0800  . nitroGLYCERIN (NITROSTAT) 0.4 MG SL tablet Place 1 tablet (0.4 mg total) under the tongue every 5 (five) minutes as needed for chest pain. (Patient taking differently: Place 0.4 mg under the tongue every 5 (five) minutes x 3 doses as needed for chest pain. ) 25 tablet prn unk at unk  . polyethylene glycol (MIRALAX / GLYCOLAX) packet Take 17 g by mouth daily as needed for mild constipation. (Patient taking differently: Take 17 g by mouth daily as needed for mild  constipation. TO BE MIXED INTO 8 OUNCES OF WATER) 14 each 0 unk at unk  . ondansetron (ZOFRAN-ODT) 4 MG disintegrating tablet Take 4 mg by mouth every 8 (eight) hours as needed for nausea or vomiting.   Not Taking at Unknown time  . potassium chloride SA (K-DUR,KLOR-CON) 20 MEQ tablet Give potassium chloride 40 mEq p.o. x2 for a total of 80 meq on 02/21/2018, and give 40 mEq p.o. x1 on 02/22/2018 then 20 mEq p.o. x1 on 02/23/2018 (Patient not taking: Reported on 04/01/2018) 8 tablet  0 Not Taking at Unknown time  . ranitidine (ZANTAC) 150 MG tablet Take 150 mg by mouth daily.   Not Taking at Unknown time   Scheduled: .  stroke: mapping our early stages of recovery book   Does not apply Once  . apixaban  2.5 mg Oral BID  . atorvastatin  20 mg Oral Daily  . DULoxetine  40 mg Oral QHS  . galantamine  16 mg Oral Q breakfast  . metoprolol succinate  100 mg Oral Daily  . ondansetron (ZOFRAN) IV  4 mg Intravenous Once  . pantoprazole  40 mg Oral Daily   Continuous: . sodium chloride 75 mL/hr at 04/02/18 0319    ROS: Unable to obtain due to AMS.   Physical Examination: Blood pressure (!) 153/85, pulse (!) 101, temperature 98.2 F (36.8 C), temperature source Oral, resp. rate 16, height 5\' 6"  (1.676 m), weight 55 kg, SpO2 97 %.  HEENT: [place]/AT Lungs: Respirations unlabored Ext: No edema  Neurologic Examination: Mental Status: Awake and alert. Unable to answer any of 5 orientation questions. Speech is non-dysarthric but at times consists of nonsensical strings of word-like utterances, at others is clear and fluent. Able to follow simple commands. Able to name pinky and thumb but not index finger. Decreased attention.  Cranial Nerves: II:  Visual fields intact with no extinction to DSS. PERRL. III,IV, VI: EOMI without nystagmus. No ptosis.  V,VII: smile symmetric, facial temp sensation equal bilaterally VIII: hearing intact to voice IX,X: No hypophonia or hoarseness XI: bilateral shoulder shrug  is symmetric and 5/5 XII: midline tongue extension  Motor: Right : Upper extremity   5/5    Left:     Upper extremity   5/5  Lower extremity   5/5     Lower extremity   5/5 Tone and bulk:normal tone throughout; no atrophy noted Cast on right forearm noted.  No pronator drift.  Sensory: FT intact in all 4 extremities Deep Tendon Reflexes:  2+ bilateral biceps and patellae Plantars: Right: downgoing   Left: downgoing Cerebellar: No ataxia with FNF bilaterally  Gait: Deferred  Results for orders placed or performed during the hospital encounter of 04/01/18 (from the past 48 hour(s))  Urine rapid drug screen (hosp performed)     Status: None   Collection Time: 04/01/18 11:42 AM  Result Value Ref Range   Opiates NONE DETECTED NONE DETECTED   Cocaine NONE DETECTED NONE DETECTED   Benzodiazepines NONE DETECTED NONE DETECTED   Amphetamines NONE DETECTED NONE DETECTED   Tetrahydrocannabinol NONE DETECTED NONE DETECTED   Barbiturates NONE DETECTED NONE DETECTED    Comment: (NOTE) DRUG SCREEN FOR MEDICAL PURPOSES ONLY.  IF CONFIRMATION IS NEEDED FOR ANY PURPOSE, NOTIFY LAB WITHIN 5 DAYS. LOWEST DETECTABLE LIMITS FOR URINE DRUG SCREEN Drug Class                     Cutoff (ng/mL) Amphetamine and metabolites    1000 Barbiturate and metabolites    200 Benzodiazepine                 161 Tricyclics and metabolites     300 Opiates and metabolites        300 Cocaine and metabolites        300 THC                            50 Performed at [facility], [place]  [address], [place], [place] [address]   CBG monitoring, ED     Status: Abnormal   Collection Time: 04/01/18 11:58 AM  Result Value Ref Range   Glucose-Capillary 102 (H) 70 - 99 mg/dL   Comment 1 Notify RN    Comment 2 Document in Chart   Ethanol     Status: None   Collection Time: 04/01/18 12:05 PM  Result Value Ref Range   Alcohol, Ethyl (B) <10 <10 mg/dL    Comment: (NOTE) Lowest detectable limit for serum alcohol is 10  mg/dL. For medical purposes only. Performed at [facility], [place] [address], [place], [place] [address]   Protime-INR     Status: None   Collection Time: 04/01/18 12:05 PM  Result Value Ref Range   Prothrombin Time 15.2 11.4 - 15.2 seconds   INR 1.21     Comment: Performed at [facility] [address] [place], [place] [address]  APTT     Status: Abnormal   Collection Time: 04/01/18 12:05 PM  Result Value Ref Range   aPTT 37 (H) 24 - 36 seconds    Comment:        IF BASELINE aPTT IS ELEVATED, SUGGEST PATIENT RISK ASSESSMENT BE USED TO DETERMINE APPROPRIATE ANTICOAGULANT THERAPY. Performed at [facility], [place] [address] [place], [place] [address]   CBC     Status: None   Collection Time: 04/01/18 12:05 PM  Result Value Ref Range   WBC 6.9 4.0 - 10.5 K/uL   RBC 5.02 3.87 - 5.11 MIL/uL   Hemoglobin 14.5 12.0 - 15.0 g/dL   HCT 45.1 36.0 - 46.0 %   MCV 89.8 80.0 - 100.0 fL   MCH 28.9 26.0 - 34.0 pg   MCHC 32.2 30.0 - 36.0 g/dL   RDW 12.4 11.5 - 15.5 %   Platelets 208 150 - 400 K/uL   nRBC 0.0 0.0 - 0.2 %    Comment: Performed at [facility], [place] [address] [place], [place] [address]  Differential     Status: None   Collection Time: 04/01/18 12:05 PM  Result Value Ref Range   Neutrophils Relative % 67 %   Neutro Abs 4.6 1.7 - 7.7 K/uL   Lymphocytes Relative 18 %   Lymphs Abs 1.3 0.7 - 4.0 K/uL   Monocytes Relative 13 %   Monocytes Absolute 0.9 0.1 - 1.0 K/uL   Eosinophils Relative 1 %   Eosinophils Absolute 0.1 0.0 - 0.5 K/uL   Basophils Relative 1 %   Basophils Absolute 0.0 0.0 - 0.1 K/uL   Immature Granulocytes 0 %   Abs Immature Granulocytes 0.01 0.00 - 0.07 K/uL    Comment: Performed at [facility], [place] [address] [place], [place] [address]  Comprehensive metabolic panel     Status: Abnormal   Collection Time: 04/01/18 12:05 PM  Result Value Ref Range   Sodium 136 135 - 145 mmol/L   Potassium 3.8 3.5 - 5.1 mmol/L   Chloride  95 (L) 98 - 111 mmol/L   CO2 28 22 - 32 mmol/L   Glucose, Bld 106 (H) 70 - 99 mg/dL   BUN 15 8 - 23 mg/dL   Creatinine, Ser 0.97 0.44 - 1.00 mg/dL   Calcium 10.4 (H) 8.9 - 10.3 mg/dL   Total Protein 7.1 6.5 - 8.1 g/dL   Albumin 3.6 3.5 - 5.0 g/dL   AST 21 15 - 41 U/L   ALT 9  0 - 44 U/L   Alkaline Phosphatase 82 38 - 126 U/L   Total Bilirubin 1.3 (H) 0.3 - 1.2 mg/dL   GFR calc non Af Amer 55 (L) >60 mL/min   GFR calc Af Amer >60 >60 mL/min   Anion gap 13 5 - 15    Comment: Performed at [facility] [address] [place], [place] [address]  I-stat troponin, ED     Status: None   Collection Time: 04/01/18 12:24 PM  Result Value Ref Range   Troponin i, poc 0.01 0.00 - 0.08 ng/mL   Comment 3            Comment: Due to the release kinetics of cTnI, a negative result within the first hours of the onset of symptoms does not rule out myocardial infarction with certainty. If myocardial infarction is still suspected, repeat the test at appropriate intervals.   Urinalysis, Routine w reflex microscopic     Status: Abnormal   Collection Time: 04/01/18  3:25 PM  Result Value Ref Range   Color, Urine YELLOW YELLOW   APPearance HAZY (A) CLEAR   Specific Gravity, Urine 1.017 1.005 - 1.030   pH 6.0 5.0 - 8.0   Glucose, UA NEGATIVE NEGATIVE mg/dL   Hgb urine dipstick NEGATIVE NEGATIVE   Bilirubin Urine NEGATIVE NEGATIVE   Ketones, ur NEGATIVE NEGATIVE mg/dL   Protein, ur NEGATIVE NEGATIVE mg/dL   Nitrite NEGATIVE NEGATIVE   Leukocytes, UA NEGATIVE NEGATIVE    Comment: Performed at [facility] [address], [place], [place] [address]  Hemoglobin A1c     Status: None   Collection Time: 04/02/18  6:04 AM  Result Value Ref Range   Hgb A1c MFr Bld 5.4 4.8 - 5.6 %    Comment: (NOTE) Pre diabetes:          5.7%-6.4% Diabetes:              >6.4% Glycemic control for   <7.0% adults with diabetes    Mean Plasma Glucose 108.28 mg/dL    Comment: Performed at [facility] [address] [place],  [address]  Lipid panel     Status: Abnormal   Collection Time: 04/02/18  6:04 AM  Result Value Ref Range   Cholesterol 144 0 - 200 mg/dL   Triglycerides 54 <150 mg/dL   HDL 35 (L) >40 mg/dL   Total CHOL/HDL Ratio 4.1 RATIO   VLDL 11 0 - 40 mg/dL   LDL Cholesterol 98 0 - 99 mg/dL    Comment:        Total Cholesterol/HDL:CHD Risk Coronary Heart Disease Risk Table                     Men   Women  1/2 Average Risk   3.4   3.3  Average Risk       5.0   4.4  2 X Average Risk   9.6   7.1  3 X Average Risk  23.4   11.0        Use the calculated Patient Ratio above and the CHD Risk Table to determine the patient's CHD Risk.        ATP III CLASSIFICATION (LDL):  <100     mg/dL   Optimal  100-129  mg/dL   Near or Above                    Optimal  130-159  mg/dL   Borderline  160-189  mg/dL   High  >190     mg/dL   Very High Performed at [facility] [address], [place], [place] [address]   Vitamin B12     Status: None   Collection Time: 04/02/18  6:04 AM  Result Value Ref Range   Vitamin B-12 450 180 - 914 pg/mL    Comment: (NOTE) This assay is not validated for testing neonatal or myeloproliferative syndrome specimens for Vitamin B12 levels. Performed at [facility], [place] [address] [place], [place] [address]   TSH     Status: None   Collection Time: 04/02/18  6:04 AM  Result Value Ref Range   TSH 1.398 0.350 - 4.500 uIU/mL    Comment: Performed by a 3rd Generation assay with a functional sensitivity of <=0.01 uIU/mL. Performed at [facility], [place] [address], [place], [place] [address]   CBC with Differential/Platelet     Status: None   Collection Time: 04/02/18  6:04 AM  Result Value Ref Range   WBC 5.2 4.0 - 10.5 K/uL   RBC 4.81 3.87 - 5.11 MIL/uL   Hemoglobin 13.7 12.0 - 15.0 g/dL   HCT 41.9 36.0 - 46.0 %   MCV 87.1 80.0 - 100.0 fL   MCH 28.5 26.0 - 34.0 pg   MCHC 32.7 30.0 - 36.0 g/dL   RDW 12.4  11.5 - 15.5 %   Platelets 225 150 - 400 K/uL   nRBC 0.0 0.0 - 0.2 %   Neutrophils Relative % 62 %   Neutro Abs 3.2 1.7 - 7.7 K/uL   Lymphocytes Relative 21 %   Lymphs Abs 1.1 0.7 - 4.0 K/uL   Monocytes Relative 14 %   Monocytes Absolute 0.7 0.1 - 1.0 K/uL   Eosinophils Relative 2 %   Eosinophils Absolute 0.1 0.0 - 0.5 K/uL   Basophils Relative 1 %   Basophils Absolute 0.1 0.0 - 0.1 K/uL   Immature Granulocytes 0 %   Abs Immature Granulocytes 0.02 0.00 - 0.07 K/uL    Comment: Performed at [facility], [address], [place], [place] [address]  Comprehensive metabolic panel     Status: Abnormal   Collection Time: 04/02/18  6:04 AM  Result Value Ref Range   Sodium 136 135 - 145 mmol/L   Potassium 3.2 (L) 3.5 - 5.1 mmol/L   Chloride 96 (L) 98 - 111 mmol/L   CO2 27 22 - 32 mmol/L   Glucose, Bld 96 70 - 99 mg/dL   BUN 10 8 - 23 mg/dL   Creatinine, Ser 0.63 0.44 - 1.00 mg/dL   Calcium 9.8 8.9 - 10.3 mg/dL   Total Protein 6.5 6.5 - 8.1 g/dL   Albumin 3.3 (L) 3.5 - 5.0 g/dL   AST 18 15 - 41 U/L   ALT 10 0 - 44 U/L   Alkaline Phosphatase 75 38 - 126 U/L   Total Bilirubin 1.6 (H) 0.3 - 1.2 mg/dL   GFR calc non Af Amer >60 >60 mL/min   GFR calc Af Amer >60 >60 mL/min   Anion gap 13 5 - 15    Comment: Performed at [facility], [place] [address], [place], [place] [address]  Ammonia     Status: None   Collection Time: 04/02/18  6:04 AM  Result Value Ref Range   Ammonia 18 9 - 35 umol/L    Comment: Performed at  [facility], [place] [address], [place], [place] [address]   Ct Head Wo Contrast  Result Date: 04/01/2018 CLINICAL DATA:  Longstanding neuro deficit EXAM: CT HEAD WITHOUT CONTRAST TECHNIQUE: Contiguous axial images were obtained from the base of the skull through the vertex without intravenous contrast. COMPARISON:  02/21/2018 FINDINGS: Brain: Atrophic and chronic white matter ischemic changes are noted similar to that seen on the prior exam. No findings to suggest  acute hemorrhage, acute infarction or space-occupying mass lesion are seen. Vascular: No hyperdense vessel or unexpected calcification. Skull: Normal. Negative for fracture or focal lesion. Sinuses/Orbits: No acute finding. Other: None. IMPRESSION: Chronic atrophic and ischemic changes without acute abnormality. Electronically Signed   By: [place] Catalina M.D.   On: 04/01/2018 15:13   Mr Brain Wo Contrast  Result Date: 04/01/2018 CLINICAL DATA:  84 y/o F; right facial droop and leading to the right. History of dementia. EXAM: MRI HEAD WITHOUT CONTRAST TECHNIQUE: Multiplanar, multiecho pulse sequences of the brain and surrounding structures were obtained without intravenous contrast. COMPARISON:  04/01/2018 CT head.  09/13/2016 MRI head. FINDINGS: Brain: Motion degradation of multiple sequences. Punctate focus of reduced diffusion within the right putamen (series 5, image 7, series 7, image 52, and series 8, image 20) compatible with acute/early subacute infarction. No hemorrhage or mass effect. Moderate for age chronic microvascular ischemic changes and volume loss of the brain, progressed from 7/18. Small focus of susceptibility hypointensity within the left occipital lobe compatible hemosiderin deposition of chronic microhemorrhage. No mass effect, extra-axial collection, hydrocephalus, or herniation. Vascular: Normal flow voids. Skull and upper cervical spine: Normal marrow signal. Sinuses/Orbits: Mild mucosal thickening of the anterior ethmoid air cells. Partial opacification of the mastoid tips. Bilateral intra-ocular lens replacements. Other: None. IMPRESSION: 1. Punctate focus of acute/early subacute infarction in the right putamen. No hemorrhage or mass effect. 2. Progression of chronic microvascular ischemic changes and volume loss of the brain from prior MRI. These results will be called to the ordering clinician or representative by the Radiologist Assistant, and communication documented in the PACS or  zVision Dashboard. Electronically Signed   By: Kristine Garbe M.D.   On: 04/01/2018 22:35   Dg Chest Portable 1 View  Result Date: 04/01/2018 CLINICAL DATA:  Altered mental status EXAM: PORTABLE CHEST 1 VIEW COMPARISON:  02/21/2018 and prior chest radiographs FINDINGS: Cardiomegaly again noted. There is no evidence of focal airspace disease, pulmonary edema, suspicious pulmonary nodule/mass, pleural effusion, or pneumothorax. No acute bony abnormalities are identified. IMPRESSION: Cardiomegaly without evidence of acute cardiopulmonary disease. Electronically Signed   By: Margarette Canada M.D.   On: 04/01/2018 16:41    MRI brain: 1. Punctate focus of acute/early subacute infarction in the right putamen. No hemorrhage or mass effect. 2. Progression of chronic microvascular ischemic changes and volume loss of the brain from prior MRI.   Assessment: 83 y.o. female with chronic atrial fibrillation on Eliquis, presenting with acute to subacute lacunar infarction within the right putamen. She presented with progressive decline in mental status, oral intake and weight. 1. DDx regarding etiology for her stroke is cardioembolic in the context of atrial fibrillation versus small vessel disease given the location of the infarction.  2. Progressive decline in mental status, oral intake and weight. 3. Regarding her cognitive exam, she is unable to answer any of 5 orientation questions. Speech is non-dysarthric but at times consists of nonsensical strings of word-like utterances, at others is clear and fluent. Able to follow simple commands. Able to  name pinky and thumb but not index finger. Decreased attention. These findings are more consistent with waxing and waning mentation due to a mild delirium than an aphasia, especially in the context of her dementia. There are no findings on MRI to correlate with her speech deficit other than the diffuse volume loss compatible with a degenerative/dementing process.   4. Stroke Risk Factors - atrial fibrillation, CAD, HTN, HLD and OSA   Plan: 1. HgbA1c, fasting lipid panel 2. MRA of the brain without contrast 3. PT consult, OT consult, Speech consult 4. Echocardiogram 5. Carotid dopplers 6. Prophylactic therapy- Continue apixaban and atorvastatin 7. Risk factor modification 8. Telemetry monitoring 9. Frequent neuro checks 10. BP management as per standard protocol as she is most likely out of the 24 hour permissive HTN time window 11. Stroke team to follow tomorrow AM   @Electronically  signed: Dr. Kerney Elbe  04/02/2018, 8:03 AM

## 2018-04-02 NOTE — Progress Notes (Signed)
[facility] swallow evaluation done but pt failed again SLP consult  For SLP BS evaluation the patient remained NPO.

## 2018-04-02 NOTE — Procedures (Signed)
  Minerva A. Merlene Laughter, MD     [email or web address]           HISTORY: The patient 84 year old female who presents with altered mental status and right facial weakness.  The studies been done to evaluate for potential seizures.  MEDICATIONS: Scheduled Meds: .  stroke: mapping our early stages of recovery book   Does not apply Once  . apixaban  2.5 mg Oral BID  . atorvastatin  20 mg Oral Daily  . DULoxetine  40 mg Oral QHS  . feeding supplement (ENSURE ENLIVE)  237 mL Oral BID BM  . galantamine  16 mg Oral Q breakfast  . metoprolol succinate  100 mg Oral Daily  . pantoprazole  40 mg Oral Daily   Continuous Infusions: . sodium chloride 75 mL/hr at 04/02/18 0319   PRN Meds:.acetaminophen **OR** acetaminophen (TYLENOL) oral liquid 160 mg/5 mL **OR** acetaminophen, albuterol, calcium carbonate, hydrALAZINE, nitroGLYCERIN, polyethylene glycol, senna-docusate     ANALYSIS: A 16 channel recording using standard 10 20 measurements is conducted for 21 minutes.  There is posterior dominant rhythm mostly in the theta range that gets as high as 6.5-7 Hz.  There is beta activity observed in frontal areas.  Photic stimulation and hyperventilation are not conducted.  There is no focal or lateralized slowing.  There is no epileptiform activity is observed.  There is occasional generalized rhythmic delta activity with frontocentral maximum.   IMPRESSION: 1.  This recording shows mild to moderate global slowing indicating a mild to moderate global encephalopathy. 2.  Generalized rhythmic delta activity with frontocentral maximum is also observed.  This rhythm is often seen in toxic metabolic processes.      Kristyanna Barcelo A. Merlene Laughter, M.D.  Diplomate, Tax adviser of Psychiatry and Neurology ( Neurology).

## 2018-04-02 NOTE — Consult Note (Signed)
Consultation Note Date: 04/02/2018   Patient Name: [name]  DOB: 1934/08/22  MRN: [id number]  Age / Sex: 84 y.o., female  PCP: Crist Infante, MD Referring Physician: Marcell Anger*  Reason for Consultation: Establishing goals of care  HPI/Patient Profile: 84 y.o. female  with past medical history of dementia, chronic a. Fib on Eliquis, HTN, HLD, CAD, recently hospitalized with recurrent falls resulting in R distal radius and ulna fracture admitted on 04/01/2018 with concern due to concern for stroke. MRI reveals acute to subacute lacunar infarct in the R putamen. Family has noted worsening mental status and weight loss since her discharge in January. Palliative medicine consulted for Man.    Clinical Assessment and Goals of Care:  I have reviewed medical records including EPIC notes, labs and imaging,  assessed the patient and then met at the bedside along with her HCPOA- Bill to discuss diagnosis prognosis, GOC, EOL wishes, disposition and options.  I introduced Palliative Medicine as specialized medical care for people living with serious illness. It focuses on providing relief from the symptoms and stress of a serious illness. The goal is to improve quality of life for both the patient and the family.  We discussed a brief life review of the patient. She worked as a Acupuncturist. Her husband is deceased. She has a son who is incarcerated.   As far as functional and nutritional status there has been significant decline, especially in the last several months. She has lost a significant amount of weight and has stopped eating on her own. [facility] notes that she will stare at food that is put in front of her. She had several falls, and is now confined to a wheelchair. They have noted her speech waxes and wanes- she will communicate some with complete sentences, but then at  other times cannot speak.   We discussed her current illness and what it means in the larger context of her on-going co-morbidities.  Natural disease trajectory and expectations at EOL were discussed. Advanced directives, concepts specific to code status, artifical feeding and hydration, and rehospitalization were considered and discussed.  I attempted to elicit values and goals of care important to the patient.  Her cognition was always very important to her. She has a living will with advanced directives stating she would not want artificial measures to sustain life- including no feeding tube.   The difference between aggressive medical intervention and comfort care was considered in light of the patient's goals of care. [facility] states he would not want surgical intervention. His main hope is to return her back to her assisted living facility, possible with Hospice.    Hospice and Palliative Care services outpatient were explained and offered.  Questions and concerns were addressed.  Hard Choices booklet left for review. The family was encouraged to call with questions or concerns.       Primary Decision Maker HCPOA- brother in law- Brigitt Mcclish- copy of HCPOA and advanced directives were placed on chart  SUMMARY OF RECOMMENDATIONS -Patient will need speech and PT eval for appropriate level of care setting- Bill is hopeful patient can return to assisted living at [facility] - even if she needs private care to supplement what Hospice and the assisted living provides- their memory care unit is currently full unfortunately -She is likely eligible for Hospice given her significant declines in cognition, nutrition, and function in the last two months- she is now wheelchair bound, not eating, and likely to decline even further after this most recent stroke- [facility] states patient would not want aggressive rehab- would prefer to focus on comfort and 'just being where she is and letting nature take its  course'   Code Status/Advance Care Planning:  DNR  Palliative Prophylaxis:   Aspiration, Delirium Protocol and Frequent Pain Assessment  Additional Recommendations (Limitations, Scope, Preferences):  Avoid Hospitalization and No Surgical Procedures  Prognosis:    < 6 months due to advanced vascular dementia with new recent CVA, significant declines in nutrition, cognition, and function prior to CVA- expect further with new incident  Discharge Planning: Home with Hospice  Primary Diagnoses: Present on Admission: . Altered mental status, unspecified . Essential hypertension . Dyslipidemia . Dementia ([place]) . Chronic atrial fibrillation . CVA (cerebral vascular accident) ([place]) . Acute CVA (cerebrovascular accident) ([place])   I have reviewed the medical record, interviewed the patient and family, and examined the patient. The following aspects are pertinent.  Past Medical History:  Diagnosis Date  . Arthritis   . Atrial fibrillation ([place])   . Bladder polyps   . Cancer ([place])    stomach  . Cataracts, bilateral   . Cervical polyp   . Chronic kidney disease    urinary incont-  . Coronary artery disease    minimal  . Dysrhythmia     AF  hx     dr Acie Fredrickson  . GERD (gastroesophageal reflux disease)   . Hiatal hernia   . Hyperlipidemia   . Hypertension   . Hypokalemia   . IBS (irritable bowel syndrome)   . IFG (impaired fasting glucose)   . Incontinence of urine   . MI, acute, non ST segment elevation ([place]) [facility]9,4709   cath  . OSA (obstructive sleep apnea)    hypoxia , not CO 2 retainer,   . OSA on CPAP 06/16/2013    Sleep study 1- 11-15 with an AHI of 61 , now titrated to CPAP 9 cm water with 2 cm EPR.   . Osteopenia   . Stromal tumor of the stomach [facility])    cancer   Social History   Socioeconomic History  . Marital status: Married    Spouse name: Joneen Boers  . Number of children: 2  . Years of education: Masters  . Highest education level: Not on file    Occupational History  . Occupation: retired    Fish farm manager: RETIRED  Social Needs  . Financial resource strain: Not on file  . Food insecurity:    Worry: Not on file    Inability: Not on file  . Transportation needs:    Medical: Not on file    Non-medical: Not on file  Tobacco Use  . Smoking status: Never Smoker  . Smokeless tobacco: Never Used  Substance and Sexual Activity  . Alcohol use: Not Currently    Alcohol/week: 2.0 standard drinks    Types: 2 Glasses of wine per week    Comment: one per week  . Drug use: No  . Sexual activity: Not Currently  Lifestyle  . Physical activity:    Days per week: Not on file    Minutes per session: Not on file  . Stress: Not on file  Relationships  . Social connections:    Talks on phone: Not on file    Gets together: Not on file    Attends religious service: Not on file    Active member of club or organization: Not on file    Attends meetings of clubs or organizations: Not on file    Relationship status: Not on file  Other Topics Concern  . Not on file  Social History Narrative   Patient is married Joneen Boers) and lives at home with her husband.   Patient is retired.   Patient has a Oceanographer in Education   Patient is right-handed.   Patient does not drink any caffeine.   Patient has one living child and one child is deceased.   Family History  Problem Relation Age of Onset  . Pancreatic cancer Paternal Grandmother   . Heart disease Paternal Grandfather        both sides of the family  . Diabetes Paternal Aunt    Scheduled Meds: .  stroke: mapping our early stages of recovery book   Does not apply Once  . apixaban  2.5 mg Oral BID  . atorvastatin  20 mg Oral Daily  . DULoxetine  40 mg Oral QHS  . feeding supplement (ENSURE ENLIVE)  237 mL Oral BID BM  . galantamine  16 mg Oral Q breakfast  . metoprolol succinate  100 mg Oral Daily  . pantoprazole  40 mg Oral Daily   Continuous Infusions: . sodium chloride 75 mL/hr at  04/02/18 0319   PRN Meds:.acetaminophen **OR** acetaminophen (TYLENOL) oral liquid 160 mg/5 mL **OR** acetaminophen, albuterol, calcium carbonate, hydrALAZINE, nitroGLYCERIN, polyethylene glycol, senna-docusate Medications Prior to Admission:  Prior to Admission medications   Medication Sig Start Date End Date Taking? Authorizing Provider  albuterol (PROVENTIL) (2.5 MG/3ML) 0.083% nebulizer solution Take 3 mLs (2.5 mg total) by nebulization every 2 (two) hours as needed for wheezing. Patient taking differently: Take 2.5 mg by nebulization 2 (two) times daily as needed for wheezing.  02/28/18  Yes Geradine Girt, DO  apixaban (ELIQUIS) 2.5 MG TABS tablet Take 1 tablet (2.5 mg total) by mouth 2 (two) times daily. 12/29/17  Yes Eugenie Filler, MD  atorvastatin (LIPITOR) 20 MG tablet Take 20 mg by mouth every evening.   Yes [provider]  bismuth subsalicylate (PEPTO BISMOL) 262 MG/15ML suspension Take 30 mLs by mouth every 6 (six) hours as needed (for nausea, indigestion, or upset stomach).    Yes [provider]  calcium carbonate (TUMS - DOSED IN MG ELEMENTAL CALCIUM) 500 MG chewable tablet Chew 1 tablet by mouth every 8 (eight) hours as needed for indigestion or heartburn.    Yes [provider]  DULoxetine (CYMBALTA) 20 MG capsule TAKE 1 CAPSULE AT NIGHT FOR 7 NIGHTS THEN TAKE 2 CAPSULES AT NIGHT Patient taking differently: Take 40 mg by mouth at bedtime.  03/06/18  Yes Magnus Sinning, MD  esomeprazole (NEXIUM) 40 MG packet Take 40 mg by mouth daily. DISSOLVE CONTENTS OF 1 PACKET IN 15ML OF WATER AND DRINK EVERY DAY   Yes [provider]  feeding supplement, ENSURE ENLIVE, (ENSURE ENLIVE) LIQD Take 237 mLs by mouth 2 (two) times daily between meals. Patient taking differently: Take 118.5 mLs by mouth 2 (two) times daily.  12/29/17  Yes Grandville Silos,  Malachy Moan, MD  galantamine (RAZADYNE ER) 16 MG 24 hr capsule Take 16 mg by mouth daily with breakfast.   Yes  [provider]  magnesium oxide (MAG-OX) 400 (241.3 Mg) MG tablet Take 1 tablet (400 mg total) by mouth 2 (two) times daily. 02/28/18  Yes Eulogio Bear U, DO  metoprolol succinate (TOPROL-XL) 100 MG 24 hr tablet Take 1 tablet (100 mg total) daily by mouth. Take with or immediately following a meal. 01/11/17  Yes Nahser, Wonda Cheng, MD  nitroGLYCERIN (NITROSTAT) 0.4 MG SL tablet Place 1 tablet (0.4 mg total) under the tongue every 5 (five) minutes as needed for chest pain. Patient taking differently: Place 0.4 mg under the tongue every 5 (five) minutes x 3 doses as needed for chest pain.  12/17/16  Yes Kilroy, Luke K, PA-C  polyethylene glycol (MIRALAX / GLYCOLAX) packet Take 17 g by mouth daily as needed for mild constipation. Patient taking differently: Take 17 g by mouth daily as needed for mild constipation. TO BE MIXED INTO 8 OUNCES OF WATER 02/28/18  Yes Vann, Jessica U, DO  ondansetron (ZOFRAN-ODT) 4 MG disintegrating tablet Take 4 mg by mouth every 8 (eight) hours as needed for nausea or vomiting.    [provider]  potassium chloride SA (K-DUR,KLOR-CON) 20 MEQ tablet Give potassium chloride 40 mEq p.o. x2 for a total of 80 meq on 02/21/2018, and give 40 mEq p.o. x1 on 02/22/2018 then 20 mEq p.o. x1 on 02/23/2018 Patient not taking: Reported on 04/01/2018 02/21/18   Domenic Moras, PA-C  ranitidine (ZANTAC) 150 MG tablet Take 150 mg by mouth daily.    [provider]   Allergies  Allergen Reactions  . Detrol [Tolterodine] Other (See Comments)    Critical reaction-per MD office  . Tranexamic Acid Other (See Comments)    Patient ineligible for Tranexamic acid due to hx of NSTEMI and CAD.  [name] Xarelto [Rivaroxaban] Other (See Comments)    Critical reaction  . Pradaxa [Dabigatran Etexilate Mesylate] Nausea And Vomiting and Other (See Comments)    Moderate reaction-per MD   Review of Systems  Unable to perform ROS: Dementia    Physical Exam Vitals signs and nursing note  reviewed.  Constitutional:      Comments: Frail, thin  Cardiovascular:     Rate and Rhythm: Normal rate.     Pulses: Normal pulses.  Pulmonary:     Effort: Pulmonary effort is normal.  Musculoskeletal:     Comments: R arm in cast  Skin:    General: Skin is warm and dry.     Coloration: Skin is pale.  Neurological:     Comments: Not oriented, speech is pressured and aphasic     Vital Signs: BP (!) 153/85 (BP Location: Right Leg)   Pulse (!) 101   Temp 98.2 F (36.8 C) (Oral)   Resp 16   Ht '5\' 6"'$  (1.676 m)   Wt 55 kg   SpO2 97%   BMI 19.57 kg/m  Pain Scale: 0-10   Pain Score: 0-No pain   SpO2: SpO2: 97 % O2 Device:SpO2: 97 % O2 Flow Rate: .   IO: Intake/output summary:   Intake/Output Summary (Last 24 hours) at 04/02/2018 1344 Last data filed at 04/02/2018 1100 Gross per 24 hour  Intake 1000.61 ml  Output 200 ml  Net 800.61 ml    LBM: Last BM Date: (PTA) Baseline Weight: Weight: 55 kg Most recent weight: Weight: 55 kg     Palliative Assessment/Data:  PPS: 20%   Flowsheet Rows     Most Recent Value  Intake Tab  Referral Department  Hospitalist  Unit at Time of Referral  ER  Palliative Care Primary Diagnosis  Neurology  Date Notified  04/01/18  Palliative Care Type  New Palliative care  Reason for referral  Clarify Goals of Care  Date of Admission  04/01/18  # of days IP prior to Palliative referral  0  Clinical Assessment  Psychosocial & Spiritual Assessment  Palliative Care Outcomes      Thank you for this consult. Palliative medicine will continue to follow and assist as needed.   Time In: 1400 Time Out: 1530 Time Total: 90 minutes minutes Prolonged services billed: yes Greater than 50%  of this time was spent counseling and coordinating care related to the above assessment and plan.  Signed by: Mariana Kaufman, AGNP-C Palliative Medicine    Please contact Palliative Medicine Team phone at [phone] for questions and concerns.  For individual  provider: See Shea Evans

## 2018-04-02 NOTE — Progress Notes (Signed)
Initial Nutrition Assessment  DOCUMENTATION CODES:   Not applicable  INTERVENTION:  Provide Ensure Enlive po BID, each supplement provides 350 kcal and 20 grams of protein.  Encourage adequate PO intake.   NUTRITION DIAGNOSIS:   Inadequate oral intake related to poor appetite(dislike of hospital food) as evidenced by meal completion < 25%.  GOAL:   Patient will meet greater than or equal to 90% of their needs  MONITOR:   PO intake, Supplement acceptance, Labs, Weight trends, I & O's, Skin  REASON FOR ASSESSMENT:   Consult Assessment of nutrition requirement/status  ASSESSMENT:    84 y.o. female with dementia, chronic atrial fibrillation on Eliquis, recurrent falls, right distal radius and ulnar fracture with cast, HTN, HLD and CAD, who initially presented to the hospital with complaints of progressive decline in mental status. MRI was obtained, revealing an acute to subacute lacunar infarction within the right putamen  Meal completion has been 25%. Pt reports dislike of food at her breakfast tray, thus did not consume most of it. Pt reports her po intake at meals recently has been inadequate and she has been "letting it slide", however unable to quantify amount of food usually eaten at meals. Pt from assisted living and reports she is offered 3 meals a day. No family at bedside. Pt unable to report usual body weight, however endorses weight loss. Per weight records, pt with a 9.5% weight loss in 3 months, which is significant for time frame. Unable to diagnose malnutrition at this time. Pt is agreeable to nutritional supplements to aid in caloric and protein needs. RD to order. Pt encouraged to eat her food at meals and to drink her supplements. Labs and medications reviewed.   NUTRITION - FOCUSED PHYSICAL EXAM:  Depletion likely related to the natural aging process.    Most Recent Value  Orbital Region  Unable to assess  Upper Arm Region  No depletion  Thoracic and Lumbar  Region  No depletion  Buccal Region  Unable to assess  Temple Region  Unable to assess  Clavicle Bone Region  Moderate depletion  Clavicle and Acromion Bone Region  Moderate depletion  Scapular Bone Region  Unable to assess  Dorsal Hand  Unable to assess  Patellar Region  No depletion  Anterior Thigh Region  No depletion  Posterior Calf Region  No depletion  Edema (RD Assessment)  None  Hair  Reviewed  Eyes  Reviewed  Mouth  Reviewed  Skin  Reviewed  Nails  Reviewed       Diet Order:   Diet Order            DIET DYS 2 Room service appropriate? Yes; Fluid consistency: Thin  Diet effective now              EDUCATION NEEDS:   Not appropriate for education at this time  Skin:  Skin Assessment: Reviewed RN Assessment  Last BM:  Unknown  Height:   Ht Readings from Last 1 Encounters:  04/01/18 5\' 6"  (1.676 m)    Weight:   Wt Readings from Last 1 Encounters:  04/01/18 55 kg    Ideal Body Weight:  59 kg  BMI:  Body mass index is 19.57 kg/m.  Estimated Nutritional Needs:   Kcal:  1450-1650  Protein:  65-75 grams  Fluid:  >/= 1.5 L/day    Corrin Parker, MS, RD, LDN Pager # [phone] After hours/ weekend pager # [phone]

## 2018-04-02 NOTE — Evaluation (Signed)
Clinical/Bedside Swallow Evaluation Patient Details  Name: [name] MRN: [id number] Date of Birth: 20-Feb-1935  Today's Date: 04/02/2018 Time: SLP Start Time (ACUTE ONLY): 0754 SLP Stop Time (ACUTE ONLY): 0804 SLP Time Calculation (min) (ACUTE ONLY): 10 min  Past Medical History:  Past Medical History:  Diagnosis Date  . Arthritis   . Atrial fibrillation ([place])   . Bladder polyps   . Cancer ([place])    stomach  . Cataracts, bilateral   . Cervical polyp   . Chronic kidney disease    urinary incont-  . Coronary artery disease    minimal  . Dysrhythmia     AF  hx     dr Acie Fredrickson  . GERD (gastroesophageal reflux disease)   . Hiatal hernia   . Hyperlipidemia   . Hypertension   . Hypokalemia   . IBS (irritable bowel syndrome)   . IFG (impaired fasting glucose)   . Incontinence of urine   . MI, acute, non ST segment elevation ([place]) XVQ.0,0867   cath  . OSA (obstructive sleep apnea)    hypoxia , not CO 2 retainer,   . OSA on CPAP 06/16/2013    Sleep study 1- 11-15 with an AHI of 61 , now titrated to CPAP 9 cm water with 2 cm EPR.   . Osteopenia   . Stromal tumor of the stomach [facility])    cancer   Past Surgical History:  Past Surgical History:  Procedure Laterality Date  . ANKLE FRACTURE SURGERY  2006   right, plate   . CARDIAC CATHETERIZATION  12/07/20210   Dr.Nahser,minor cad  . COLONOSCOPY    . COLONOSCOPY N/A 05/24/202016   Procedure: COLONOSCOPY;  Surgeon: Inda Castle, MD;  Location: [facility];  Service: Endoscopy;  Laterality: N/A;  . ENTEROSCOPY N/A 05/24/202016   Procedure: ENTEROSCOPY;  Surgeon: Inda Castle, MD;  Location: [facility] ENDOSCOPY;  Service: Endoscopy;  Laterality: N/A;  . EYE SURGERY     both cataracts  . KNEE ARTHROSCOPY Right 05/28/2012   Procedure: RIGHT KNEE ARTHROSCOPY PARTIAL MEDIAL AND LATERAL MENISECTOMY WITH CHONDROPLASTY;  Surgeon: Alta Corning, MD;  Location: [place];  Service: Orthopedics;  Laterality: Right;  . stromal  stomach  tumor  03/26/2007   resection  . TOTAL KNEE ARTHROPLASTY Right 08/10/2013   Procedure: TOTAL KNEE ARTHROPLASTY;  Surgeon: Alta Corning, MD;  Location: [place];  Service: Orthopedics;  Laterality: Right;  . UPPER GI ENDOSCOPY     HPI:  84 y.o. female from SNF with chronic atrial fibrillation on Eliquis, presenting with acute to subacute lacunar infarction within the right putamen. She as history of dementia with cognitive deficits but family reports more progressive decline in mental status, oral intake and weight. DDx regarding etiology for her stroke is cardioembolic in the context of atrial fibrillation versus small vessel disease given the location of the infarction.  MRI also reveals progression of chronic microvascular ischemic changes and volume loss of the brain from prior MRI.   Assessment / Plan / Recommendation Clinical Impression  Pt presents with mild oral phase dysphagia that is likely based on cognitive deficits. Pt with slow but effective mastication of solids, good independent lingual sweeping to get graham cracker from teeth and gum line. Complete oral clearing. However given pt's slow mastication, would recommend dysphagia 2 for energy conservation and to promote increased intake. Pt consumed thin liquidsvia straw with appearance of swift swallow initiation and no overt s/s of aspiration. Instrumental swallow study  is not indicated at this time. Will defer further services to next venue of care to allow pt more time to increase consumption and given nature of  cognitive deficits, next venue of care will be more familiar to pt and more effective in implementing lasting diet modifications. Education provided to nursing. ST to sign off at this time.  SLP Visit Diagnosis: Dysphagia, oral phase (R13.11)    Aspiration Risk  Mild aspiration risk    Diet Recommendation Dysphagia 2 (Fine chop);Thin liquid   Liquid Administration via: Cup;Straw Medication Administration: Whole meds  with puree Supervision: Staff to assist with self feeding(d/t cognitive deficits) Compensations: Minimize environmental distractions;Slow rate;Small sips/bites Postural Changes: Seated upright at 90 degrees    Other  Recommendations Oral Care Recommendations: Oral care BID   Follow up Recommendations Skilled Nursing facility             Prognosis Barriers to Reach Goals: Cognitive deficits      Swallow Study   General Date of Onset: 04/01/18 HPI: 84 y.o. female from SNF with chronic atrial fibrillation on Eliquis, presenting with acute to subacute lacunar infarction within the right putamen. She as history of dementia with cognitive deficits but family reports more progressive decline in mental status, oral intake and weight. DDx regarding etiology for her stroke is cardioembolic in the context of atrial fibrillation versus small vessel disease given the location of the infarction.  MRI also reveals progression of chronic microvascular ischemic changes and volume loss of the brain from prior MRI. Type of Study: Bedside Swallow Evaluation Previous Swallow Assessment: none in chart Diet Prior to this Study: NPO Temperature Spikes Noted: No Respiratory Status: Room air History of Recent Intubation: No Behavior/Cognition: Alert;Cooperative;Pleasant mood;Confused;Requires cueing Oral Cavity Assessment: Within Functional Limits Oral Care Completed by SLP: Yes Oral Cavity - Dentition: Adequate natural dentition Vision: Functional for self-feeding Self-Feeding Abilities: Needs assist Patient Positioning: Upright in bed Baseline Vocal Quality: Normal Volitional Cough: Cognitively unable to elicit Volitional Swallow: Able to elicit    Oral/Motor/Sensory Function Overall Oral Motor/Sensory Function: (appeared functional within PO intake)   Ice Chips Ice chips: Not tested   Thin Liquid Thin Liquid: Within functional limits Presentation: Straw;Self Fed    Nectar Thick Nectar Thick  Liquid: Not tested   Honey Thick Honey Thick Liquid: Not tested   Puree Puree: Within functional limits Presentation: Spoon;Self Fed   Solid     Solid: Impaired Presentation: Self Fed Oral Phase Impairments: Impaired mastication Other Comments: (graham cracker)      Yesena Reaves 04/02/2018,8:43 AM

## 2018-04-02 NOTE — Plan of Care (Signed)
  Problem: Education: Goal: Knowledge of secondary prevention will improve Outcome: Progressing   Problem: Coping: Goal: Will verbalize positive feelings about self Outcome: Progressing   Problem: Coping: Goal: Will identify appropriate support needs Outcome: Progressing

## 2018-04-02 NOTE — Evaluation (Signed)
Speech Language Pathology Evaluation Patient Details Name: [name] MRN: [id number] DOB: 04/29/1934 Today's Date: 04/02/2018 Time: 9563-8756 SLP Time Calculation (min) (ACUTE ONLY): 11 min  Problem List:  Patient Active Problem List   Diagnosis Date Noted  . Altered mental status, unspecified 04/01/2018  . Dementia ([place]) 04/01/2018  . Falls 02/21/2018  . Syncope, vasovagal 02/21/2018  . Hyponatremia 12/27/2017  . Traumatic arthritis of ankle, right 08/12/2017  . History of non-ST elevation myocardial infarction (NSTEMI) 12/17/2016  . Chronic anticoagulation 12/17/2016  . Chest pain with moderate risk for cardiac etiology 05/30/2015  . Internal and external hemorrhoids without complication 43/32/9518  . History of GI bleed 06/24/2014  . Chronic diastolic heart failure ([place]) 06/24/2014  . Chronic atrial fibrillation 06/24/2014  . Pre-syncope 05/05/2014  . Osteoarthritis of right knee 08/10/2013  . OSA on CPAP 06/16/2013  . Bradycardia 10/25/2010  . Fatigue 10/25/2010  . Dyspnea 09/18/2010  . Coronary artery disease   . Hypokalemia   . Incontinence of urine   . Stromal tumor of the stomach ([place])   . Dyslipidemia 05/06/2007  . Essential hypertension 05/06/2007  . GERD 05/06/2007  . IRRITABLE BOWEL SYNDROME 05/06/2007   Past Medical History:  Past Medical History:  Diagnosis Date  . Arthritis   . Atrial fibrillation ([place])   . Bladder polyps   . Cancer ([place])    stomach  . Cataracts, bilateral   . Cervical polyp   . Chronic kidney disease    urinary incont-  . Coronary artery disease    minimal  . Dysrhythmia     AF  hx     dr Acie Fredrickson  . GERD (gastroesophageal reflux disease)   . Hiatal hernia   . Hyperlipidemia   . Hypertension   . Hypokalemia   . IBS (irritable bowel syndrome)   . IFG (impaired fasting glucose)   . Incontinence of urine   . MI, acute, non ST segment elevation ([place]) ACZ.6,6063   cath  . OSA (obstructive sleep apnea)    hypoxia , not CO 2  retainer,   . OSA on CPAP 06/16/2013    Sleep study 1- 11-15 with an AHI of 61 , now titrated to CPAP 9 cm water with 2 cm EPR.   . Osteopenia   . Stromal tumor of the stomach [facility])    cancer   Past Surgical History:  Past Surgical History:  Procedure Laterality Date  . ANKLE FRACTURE SURGERY  2006   right, plate   . CARDIAC CATHETERIZATION  12/07/20210   Dr.Nahser,minor cad  . COLONOSCOPY    . COLONOSCOPY N/A Mar 05, 202016   Procedure: COLONOSCOPY;  Surgeon: Inda Castle, MD;  Location: [place];  Service: Endoscopy;  Laterality: N/A;  . ENTEROSCOPY N/A Mar 05, 202016   Procedure: ENTEROSCOPY;  Surgeon: Inda Castle, MD;  Location: [facility] ENDOSCOPY;  Service: Endoscopy;  Laterality: N/A;  . EYE SURGERY     both cataracts  . KNEE ARTHROSCOPY Right 05/28/2012   Procedure: RIGHT KNEE ARTHROSCOPY PARTIAL MEDIAL AND LATERAL MENISECTOMY WITH CHONDROPLASTY;  Surgeon: Alta Corning, MD;  Location: [place];  Service: Orthopedics;  Laterality: Right;  . stromal stomach  tumor  03/26/2007   resection  . TOTAL KNEE ARTHROPLASTY Right 08/10/2013   Procedure: TOTAL KNEE ARTHROPLASTY;  Surgeon: Alta Corning, MD;  Location: [place];  Service: Orthopedics;  Laterality: Right;  . UPPER GI ENDOSCOPY     HPI:  84 y.o. female from SNF with chronic atrial fibrillation  on Eliquis, presenting with acute to subacute lacunar infarction within the right putamen. She as history of dementia with cognitive deficits but family reports more progressive decline in mental status, oral intake and weight. DDx regarding etiology for her stroke is cardioembolic in the context of atrial fibrillation versus small vessel disease given the location of the infarction.  MRI also reveals progression of chronic microvascular ischemic changes and volume loss of the brain from prior MRI.   Assessment / Plan / Recommendation Clinical Impression  Pt presents with cognitive deficits that appear commensurate with deficits  reported in PT/OT notes from recent admission. Pt is oriented to herself, pleasant, appropriate and easily redirected to task. Pt is not able to follow 1 step directions but is responsive to direction from SLP. Will defer follow up ST services to next venue of care to target within familiar functional setting for pt. ST to sign off.    SLP Assessment  SLP Recommendation/Assessment: All further Speech Lanaguage Pathology  needs can be addressed in the next venue of care SLP Visit Diagnosis: Cognitive communication deficit (R41.841)    Follow Up Recommendations  Skilled Nursing facility          SLP Evaluation Cognition  Overall Cognitive Status: History of cognitive impairments - at baseline Arousal/Alertness: Awake/alert Orientation Level: Oriented to person;Disoriented to place;Disoriented to time;Disoriented to situation       Comprehension  Auditory Comprehension Overall Auditory Comprehension: Impaired at baseline    Expression Expression Primary Mode of Expression: Verbal Verbal Expression Overall Verbal Expression: Impaired at baseline(d/t dementia) Written Expression Dominant Hand: Right   Oral / Motor  Oral Motor/Sensory Function Overall Oral Motor/Sensory Function: (functional when consuming POs) Motor Speech Overall Motor Speech: Impaired at baseline   GO                    Lennyn Bellanca 04/02/2018, 8:48 AM

## 2018-04-02 NOTE — Progress Notes (Signed)
  Echocardiogram 2D Echocardiogram has been performed.  Hosteen Kienast G Christipher Rieger 04/02/2018, 2:25 PM

## 2018-04-02 NOTE — Progress Notes (Signed)
Pt has become mildly dysarthric. MD has been notified.

## 2018-04-02 NOTE — Progress Notes (Signed)
EEG Completed; Results Pending  

## 2018-04-02 NOTE — Progress Notes (Signed)
PROGRESS NOTE    [name]  XVQ:[id number] DOB: 1934-05-29 DOA: 04/01/2018 PCP: Crist Infante, MD   Brief Narrative:  [name] is a 84 y.o. female with medical history significant of dementia, chronic atrial fibrillation on Eliquis, hypertension, hyperlipidemia, coronary artery disease, right distal radius and ulna fracture currently with a cast, recurrent falls who was sent from her doctor's office with a concern for stroke.  Patient is confused from her dementia and does not provide much history.  Relatives/family members at bedside provides some history.  Patient was discharged to skilled nursing facility in early January 2020 for rehab for recurrent falls and right distal radius and ulna fracture.  Subsequently she was discharged back to assisted living facility almost a week ago.  Family members are concerned that her mental status has progressively declined with rapid decline in her oral intake and her weight as well.  Over the last couple of days, she was very slow to respond.  There was no known seizure-like activities or bowel bladder incontinence.  No recent fever, nausea or vomiting reported.  At her doctor's office, there was a question of right facial droop so she was sent to the ED for concern for stroke.   Assessment & Plan:   Principal Problem:   Altered mental status, unspecified Active Problems:   Dyslipidemia   Essential hypertension   Chronic atrial fibrillation   Dementia (HCC)   CVA (cerebral vascular accident) ([place])   Acute CVA (cerebrovascular accident) ([place])   Acute vs subacute stroke -As previously noted, there was a question of facial droop in the doctor's office which was concerning for stroke.  Rule out stroke. -CT of the head was negative for abnormality.  Will get MRI of the brain as well as MRA.  2D echo recently done in December as were  Ultrasound of carotids. -PT/OT/SLP evaluation appreciated, recs for snf -EEG -Vitamin B12 - 450 /folate pend /TSH  1.4 /ammonia 18 -Fall precautions. -Patient's overall general medical condition has been declining pretty quickly recently as per the family.  Her appetite is poor.  They do not want feeding tube.    Follow-up palliative care consultation   chronic atrial fibrillation -Currently rate controlled.  Continue Eliquis.  Hypertension -Monitor blood pressure.  Hold antihypertensives for now, start to titrate back in  Dyslipidemia -Continue statin  Recent right distal radius and ulnar fracture -Currently has a cast.  Outpatient follow-up with orthopedics  DVT prophylaxis: KD:[id number]  Code Status: DNR    Code Status Orders  (From admission, onward)         Start     Ordered   04/01/18 1639  Do not attempt resuscitation (DNR)  Continuous    Question Answer Comment  In the event of cardiac or respiratory ARREST Do not call a "code blue"   In the event of cardiac or respiratory ARREST Do not perform Intubation, CPR, defibrillation or ACLS   In the event of cardiac or respiratory ARREST Use medication by any route, position, wound care, and other measures to relive pain and suffering. May use oxygen, suction and manual treatment of airway obstruction as needed for comfort.      04/01/18 1643        Code Status History    Date Active Date Inactive Code Status Order ID Comments User Context   02/21/2018 1827 02/28/2018 1606 Full Code [id number]  Roxan Hockey, MD Inpatient   12/27/2017 1636 12/29/2017 1934 Full Code [id number]  Mariel Aloe,  MD Inpatient   06/24/2014 1857 04-26-202016 2019 Full Code [id number]  Annita Brod, MD Inpatient   08/10/2013 1555 08/12/2013 1821 Full Code [id number]  Penelope Coop Inpatient     Family Communication: none present  Disposition Plan:   home 1-2 days Consults called: neuro Admission status: Inpatient   Consultants:   neuro  Procedures:  Ct Head Wo Contrast  Result Date: 04/01/2018 CLINICAL DATA:  Longstanding neuro deficit  EXAM: CT HEAD WITHOUT CONTRAST TECHNIQUE: Contiguous axial images were obtained from the base of the skull through the vertex without intravenous contrast. COMPARISON:  02/21/2018 FINDINGS: Brain: Atrophic and chronic white matter ischemic changes are noted similar to that seen on the prior exam. No findings to suggest acute hemorrhage, acute infarction or space-occupying mass lesion are seen. Vascular: No hyperdense vessel or unexpected calcification. Skull: Normal. Negative for fracture or focal lesion. Sinuses/Orbits: No acute finding. Other: None. IMPRESSION: Chronic atrophic and ischemic changes without acute abnormality. Electronically Signed   By: Inez Catalina M.D.   On: 04/01/2018 15:13   Mr Brain Wo Contrast  Result Date: 04/01/2018 CLINICAL DATA:  84 y/o F; right facial droop and leading to the right. History of dementia. EXAM: MRI HEAD WITHOUT CONTRAST TECHNIQUE: Multiplanar, multiecho pulse sequences of the brain and surrounding structures were obtained without intravenous contrast. COMPARISON:  04/01/2018 CT head.  09/13/2016 MRI head. FINDINGS: Brain: Motion degradation of multiple sequences. Punctate focus of reduced diffusion within the right putamen (series 5, image 7, series 7, image 52, and series 8, image 20) compatible with acute/early subacute infarction. No hemorrhage or mass effect. Moderate for age chronic microvascular ischemic changes and volume loss of the brain, progressed from 7/18. Small focus of susceptibility hypointensity within the left occipital lobe compatible hemosiderin deposition of chronic microhemorrhage. No mass effect, extra-axial collection, hydrocephalus, or herniation. Vascular: Normal flow voids. Skull and upper cervical spine: Normal marrow signal. Sinuses/Orbits: Mild mucosal thickening of the anterior ethmoid air cells. Partial opacification of the mastoid tips. Bilateral intra-ocular lens replacements. Other: None. IMPRESSION: 1. Punctate focus of acute/early  subacute infarction in the right putamen. No hemorrhage or mass effect. 2. Progression of chronic microvascular ischemic changes and volume loss of the brain from prior MRI. These results will be called to the ordering clinician or representative by the Radiologist Assistant, and communication documented in the PACS or zVision Dashboard. Electronically Signed   By: Kristine Garbe M.D.   On: 04/01/2018 22:35   Dg Chest Portable 1 View  Result Date: 04/01/2018 CLINICAL DATA:  Altered mental status EXAM: PORTABLE CHEST 1 VIEW COMPARISON:  02/21/2018 and prior chest radiographs FINDINGS: Cardiomegaly again noted. There is no evidence of focal airspace disease, pulmonary edema, suspicious pulmonary nodule/mass, pleural effusion, or pneumothorax. No acute bony abnormalities are identified. IMPRESSION: Cardiomegaly without evidence of acute cardiopulmonary disease. Electronically Signed   By: Margarette Canada M.D.   On: 04/01/2018 16:41     Antimicrobials:   none    Subjective: Patient reports feeling weak.  Later on this afternoon she was more dysarthric Neurology did not feel it warranted CT of head for hemorrhagic conversion eval  Objective: Vitals:   04/02/18 0200 04/02/18 0400 04/02/18 0440 04/02/18 0752  BP: (!) 161/100 (!) 178/108 (!) 158/75 (!) 153/85  Pulse: 85 (!) 101 99 (!) 101  Resp: 17 15 13 16   Temp: 97.6 F (36.4 C) 98.3 F (36.8 C)  98.2 F (36.8 C)  TempSrc: Oral Oral  Oral  SpO2: 97%  100%  97%  Weight:      Height:        Intake/Output Summary (Last 24 hours) at 04/02/2018 1329 Last data filed at 04/02/2018 1100 Gross per 24 hour  Intake 1000.61 ml  Output 200 ml  Net 800.61 ml   Filed Weights   04/01/18 1143  Weight: 55 kg    Examination:  General exam: Appears calm and comfortable  Respiratory system: Clear to auscultation. Respiratory effort normal. Cardiovascular system: S1 & S2 heard, RRR. No JVD, murmurs, rubs, gallops or clicks. No pedal  edema. Gastrointestinal system: Abdomen is nondistended, soft and nontender. No organomegaly or masses felt. Normal bowel sounds heard. Central nervous system: Alert and oriented. No focal neurological deficits. Extremities: Symmetric 5 x 5 power. Skin: No rashes, lesions or ulcers Psychiatry: Judgement and insight appear normal. Mood & affect appropriate.     Data Reviewed: I have personally reviewed following labs and imaging studies  CBC: Recent Labs  Lab 04/01/18 1205 04/02/18 0604  WBC 6.9 5.2  NEUTROABS 4.6 3.2  HGB 14.5 13.7  HCT 45.1 41.9  MCV 89.8 87.1  PLT 208 161   Basic Metabolic Panel: Recent Labs  Lab 04/01/18 1205 04/02/18 0604  NA 136 136  K 3.8 3.2*  CL 95* 96*  CO2 28 27  GLUCOSE 106* 96  BUN 15 10  CREATININE 0.97 0.63  CALCIUM 10.4* 9.8   GFR: Estimated Creatinine Clearance: 47.9 mL/min (by C-G formula based on SCr of 0.63 mg/dL). Liver Function Tests: Recent Labs  Lab 04/01/18 1205 04/02/18 0604  AST 21 18  ALT 9 10  ALKPHOS 82 75  BILITOT 1.3* 1.6*  PROT 7.1 6.5  ALBUMIN 3.6 3.3*   No results for input(s): LIPASE, AMYLASE in the last 168 hours. Recent Labs  Lab 04/02/18 0604  AMMONIA 18   Coagulation Profile: Recent Labs  Lab 04/01/18 1205  INR 1.21   Cardiac Enzymes: No results for input(s): CKTOTAL, CKMB, CKMBINDEX, TROPONINI in the last 168 hours. BNP (last 3 results) No results for input(s): PROBNP in the last 8760 hours. HbA1C: Recent Labs    04/02/18 0604  HGBA1C 5.4   CBG: Recent Labs  Lab 04/01/18 1158  GLUCAP 102*   Lipid Profile: Recent Labs    04/02/18 0604  CHOL 144  HDL 35*  LDLCALC 98  TRIG 54  CHOLHDL 4.1   Thyroid Function Tests: Recent Labs    04/02/18 0604  TSH 1.398   Anemia Panel: Recent Labs    04/02/18 0604  VITAMINB12 450   Sepsis Labs: No results for input(s): PROCALCITON, LATICACIDVEN in the last 168 hours.  Recent Results (from the past 240 hour(s))  Urine culture      Status: None   Collection Time: 04/01/18  3:27 PM  Result Value Ref Range Status   Specimen Description URINE, CATHETERIZED  Final   Special Requests NONE  Final   Culture   Final    NO GROWTH Performed at [facility], [address], [place], [place] [address]    Report Status 04/02/2018 FINAL  Final         Radiology Studies: Ct Head Wo Contrast  Result Date: 04/01/2018 CLINICAL DATA:  Longstanding neuro deficit EXAM: CT HEAD WITHOUT CONTRAST TECHNIQUE: Contiguous axial images were obtained from the base of the skull through the vertex without intravenous contrast. COMPARISON:  02/21/2018 FINDINGS: Brain: Atrophic and chronic white matter ischemic changes are noted similar to that seen on the prior  exam. No findings to suggest acute hemorrhage, acute infarction or space-occupying mass lesion are seen. Vascular: No hyperdense vessel or unexpected calcification. Skull: Normal. Negative for fracture or focal lesion. Sinuses/Orbits: No acute finding. Other: None. IMPRESSION: Chronic atrophic and ischemic changes without acute abnormality. Electronically Signed   By: Inez Catalina M.D.   On: 04/01/2018 15:13   Mr Brain Wo Contrast  Result Date: 04/01/2018 CLINICAL DATA:  84 y/o F; right facial droop and leading to the right. History of dementia. EXAM: MRI HEAD WITHOUT CONTRAST TECHNIQUE: Multiplanar, multiecho pulse sequences of the brain and surrounding structures were obtained without intravenous contrast. COMPARISON:  04/01/2018 CT head.  09/13/2016 MRI head. FINDINGS: Brain: Motion degradation of multiple sequences. Punctate focus of reduced diffusion within the right putamen (series 5, image 7, series 7, image 52, and series 8, image 20) compatible with acute/early subacute infarction. No hemorrhage or mass effect. Moderate for age chronic microvascular ischemic changes and volume loss of the brain, progressed from 7/18. Small focus of susceptibility hypointensity within the left  occipital lobe compatible hemosiderin deposition of chronic microhemorrhage. No mass effect, extra-axial collection, hydrocephalus, or herniation. Vascular: Normal flow voids. Skull and upper cervical spine: Normal marrow signal. Sinuses/Orbits: Mild mucosal thickening of the anterior ethmoid air cells. Partial opacification of the mastoid tips. Bilateral intra-ocular lens replacements. Other: None. IMPRESSION: 1. Punctate focus of acute/early subacute infarction in the right putamen. No hemorrhage or mass effect. 2. Progression of chronic microvascular ischemic changes and volume loss of the brain from prior MRI. These results will be called to the ordering clinician or representative by the Radiologist Assistant, and communication documented in the PACS or zVision Dashboard. Electronically Signed   By: Kristine Garbe M.D.   On: 04/01/2018 22:35   Dg Chest Portable 1 View  Result Date: 04/01/2018 CLINICAL DATA:  Altered mental status EXAM: PORTABLE CHEST 1 VIEW COMPARISON:  02/21/2018 and prior chest radiographs FINDINGS: Cardiomegaly again noted. There is no evidence of focal airspace disease, pulmonary edema, suspicious pulmonary nodule/mass, pleural effusion, or pneumothorax. No acute bony abnormalities are identified. IMPRESSION: Cardiomegaly without evidence of acute cardiopulmonary disease. Electronically Signed   By: Margarette Canada M.D.   On: 04/01/2018 16:41        Scheduled Meds: .  stroke: mapping our early stages of recovery book   Does not apply Once  . apixaban  2.5 mg Oral BID  . atorvastatin  20 mg Oral Daily  . DULoxetine  40 mg Oral QHS  . feeding supplement (ENSURE ENLIVE)  237 mL Oral BID BM  . galantamine  16 mg Oral Q breakfast  . metoprolol succinate  100 mg Oral Daily  . pantoprazole  40 mg Oral Daily   Continuous Infusions: . sodium chloride 75 mL/hr at 04/02/18 0319     LOS: 0 days    Time spent: 57 min    Nicolette Bang, MD [facility]  If 7PM-7AM, please contact night-coverage  04/02/2018, 1:29 PM

## 2018-04-02 NOTE — Evaluation (Signed)
Occupational Therapy Evaluation Patient Details Name: [name] MRN: [id number] DOB: April 06, 1934 Today's Date: 04/02/2018    History of Present Illness [name] is a 84 y.o. female with medical history significant of dementia, chronic atrial fibrillation on Eliquis, hypertension, hyperlipidemia, coronary artery disease, right distal radius and ulna fracture currently with a cast, recurrent falls who was sent from her doctor's office with a concern for stroke.     Clinical Impression   Pt admitted with AMS from ALF facility and now presenting to OT with cognitive impairments, deficits in functional activity tolerance, balance, and generalized muscle weakness. Mod +2 assist to advance pt to EOB from supine. Functional mobility in session was limited by large fluctuations in HR (ranging 99-128 BPM) and hypertension as described below. Pt reporting dizziness throughout session.   Prior to session: 153/85 (103) Sitting: 173/134 (144) Sitting after ~2 mins: 169/102 (122) Supine at end of session: 166/107 (123)  Pt would benefit from continued skilled OT services to maximize return to PLOF. Recommend pt d/c to SNF with 24/7 (S) d/t cognitive and physical deficits.     Follow Up Recommendations  SNF;Supervision/Assistance - 24 hour    Equipment Recommendations  None recommended by OT    Recommendations for Other Services       Precautions / Restrictions Precautions Precautions: Fall Restrictions Weight Bearing Restrictions: (Nothing in chart but suspect NWB through R hand)      Mobility Bed Mobility Overal bed mobility: Needs Assistance Bed Mobility: Supine to Sit     Supine to sit: +2 for physical assistance;Mod assist     General bed mobility comments: cueing for technique, pt able to initiate bed mobility independently but requried assist to come to sitting   Transfers Overall transfer level: Needs assistance               General transfer comment: HR and  hypertension inhibited any transfers    Balance Overall balance assessment: Needs assistance Sitting-balance support: Feet supported Sitting balance-Leahy Scale: Poor Sitting balance - Comments: Occasional min A posteriorly Postural control: Posterior lean                                 ADL either performed or assessed with clinical judgement   ADL Overall ADL's : Needs assistance/impaired Eating/Feeding: NPO   Grooming: Oral care;Minimal assistance;Sitting   Upper Body Bathing: Moderate assistance;Sitting   Lower Body Bathing: Sitting/lateral leans;Maximal assistance;+2 for physical assistance   Upper Body Dressing : Moderate assistance;Sitting   Lower Body Dressing: Maximal assistance;+2 for safety/equipment;Sit to/from stand               Functional mobility during ADLs: (Unable to progress past EOB d/t hypertension/HR) General ADL Comments: Simulated/clinical judgement on ADLs     Vision Baseline Vision/History: (unable to assess) Patient Visual Report: (unable to report) Vision Assessment?: (Pt tracking therapists at time, unable to formally assess)     Perception     Praxis      Pertinent Vitals/Pain Pain Assessment: No/denies pain     Hand Dominance Right   Extremity/Trunk Assessment Upper Extremity Assessment Upper Extremity Assessment: Generalized weakness   Lower Extremity Assessment Lower Extremity Assessment: Defer to PT evaluation   Cervical / Trunk Assessment Cervical / Trunk Assessment: Kyphotic   Communication Communication Communication: Expressive difficulties;Receptive difficulties   Cognition Arousal/Alertness: Lethargic Behavior During Therapy: Flat affect Overall Cognitive Status: Impaired/Different from baseline Area of Impairment:  Orientation;Memory;Following commands;Awareness;Safety/judgement                 Orientation Level: Time   Memory: Decreased short-term memory Following Commands: Follows  one step commands inconsistently Safety/Judgement: Decreased awareness of safety Awareness: Intellectual   General Comments: Pt following directions 50% of the time, slow processing   General Comments  Pt had intermittent moments of lucidity during session     Exercises     Shoulder Instructions      Home Living Family/patient expects to be discharged to:: Skilled nursing facility   Available Help at Discharge: [place] Type of Home: [place]: Kasandra Knudsen - single point;Shower seat - built in;Grab bars - toilet;Grab bars - tub/shower;Hand held shower head          Prior Functioning/Environment Level of Independence: Needs assistance  Gait / Transfers Assistance Needed: independent mobility, h/o mulit falls ADL's / Homemaking Assistance Needed: assist with housekeeping/meals at ILF   Comments: All hx obtained from medical record        OT Problem List: Decreased strength;Decreased activity tolerance;Impaired balance (sitting and/or standing);Decreased coordination;Decreased cognition;Decreased safety awareness;Decreased knowledge of use of DME or AE;Decreased knowledge of precautions;Cardiopulmonary status limiting activity      OT Treatment/Interventions: Self-care/ADL training;Therapeutic exercise;Energy conservation;Balance training;Patient/family education;Cognitive remediation/compensation;Therapeutic activities;Modalities;Manual therapy;DME and/or AE instruction    OT Goals(Current goals can be found in the care plan section) Acute Rehab OT Goals Patient Stated Goal: none stated OT Goal Formulation: Patient unable to participate in goal setting Time For Goal Achievement: 04/07/18 Potential to Achieve Goals: Fair  OT Frequency: Min 2X/week           Co-evaluation PT/OT/SLP Co-Evaluation/Treatment: Yes Reason for Co-Treatment: Complexity of the patient's impairments (multi-system  involvement)   OT goals addressed during session: ADL's and self-care      AM-PAC OT "6 Clicks" Daily Activity     Outcome Measure Help from another person eating meals?: Total Help from another person taking care of personal grooming?: A Little Help from another person toileting, which includes using toliet, bedpan, or urinal?: A Lot Help from another person bathing (including washing, rinsing, drying)?: A Lot Help from another person to put on and taking off regular upper body clothing?: A Lot Help from another person to put on and taking off regular lower body clothing?: A Lot 6 Click Score: 12   End of Session Nurse Communication: Other (comment)(Vitals during session)  Activity Tolerance: Treatment limited secondary to medical complications (Comment) Patient left: in bed;with call bell/phone within reach;with bed alarm set  OT Visit Diagnosis: Muscle weakness (generalized) (M62.81);History of falling (Z91.81);Cognitive communication deficit (R41.841)                Time: 1771-1657 OT Time Calculation (min): 26 min Charges:  OT General Charges $OT Visit: 1 Visit OT Evaluation $OT Eval Moderate Complexity: [place] OTR/L  04/02/2018, 9:25 AM

## 2018-04-03 DIAGNOSIS — R400 Somnolence: Secondary | ICD-10-CM

## 2018-04-03 DIAGNOSIS — Z515 Encounter for palliative care: Secondary | ICD-10-CM

## 2018-04-03 DIAGNOSIS — Z7189 Other specified counseling: Secondary | ICD-10-CM

## 2018-04-03 DIAGNOSIS — I639 Cerebral infarction, unspecified: Secondary | ICD-10-CM

## 2018-04-03 LAB — HEMOGLOBIN A1C
Hgb A1c MFr Bld: 5.4 % (ref 4.8–5.6)
Mean Plasma Glucose: 108.28 mg/dL

## 2018-04-03 LAB — LIPID PANEL
Cholesterol: 119 mg/dL (ref 0–200)
HDL: 32 mg/dL — ABNORMAL LOW (ref >40)
LDL Cholesterol: 76 mg/dL (ref 0–99)
TRIGLYCERIDES: 53 mg/dL (ref <150)
Total CHOL/HDL Ratio: 3.7 RATIO
VLDL: 11 mg/dL (ref 0–40)

## 2018-04-03 LAB — FOLATE RBC
Folate, Hemolysate: >620 ng/mL
Hematocrit: 43.7 % (ref 34.0–46.6)

## 2018-04-03 MED ORDER — HALOPERIDOL LACTATE 5 MG/ML IJ SOLN
5.0000 mg | Freq: Once | INTRAMUSCULAR | Status: AC
  Administered 2018-04-03: 5 mg via INTRAVENOUS
  Filled 2018-04-03: qty 1

## 2018-04-03 NOTE — Progress Notes (Signed)
Daily Progress Note   Patient Name: [name]       Date: 04/03/2018 DOB: 08-24-1934  Age: 84 y.o. MRN#: [id number] Attending Physician: Marcell Anger* Primary Care Physician: Crist Infante, MD Admit Date: 04/01/2018  Reason for Consultation/Follow-up: Establishing goals of care  Subjective:  patient is awake alert, resting in bed.  She has some word finding difficulty, struggles with words. Is able to answer few yes/no type questions appropriately.  She doesn't appear to be in distress No family at bedside, call placed ad discussed with brother, see below.    Length of Stay: 1  Current Medications: Scheduled Meds:  .  stroke: mapping our early stages of recovery book   Does not apply Once  . apixaban  2.5 mg Oral BID  . atorvastatin  20 mg Oral Daily  . DULoxetine  40 mg Oral QHS  . feeding supplement (ENSURE ENLIVE)  237 mL Oral BID BM  . galantamine  16 mg Oral Q breakfast  . metoprolol succinate  100 mg Oral Daily  . pantoprazole  40 mg Oral Daily    Continuous Infusions: . sodium chloride 75 mL/hr at 04/03/18 0142    PRN Meds: acetaminophen **OR** acetaminophen (TYLENOL) oral liquid 160 mg/5 mL **OR** acetaminophen, albuterol, calcium carbonate, hydrALAZINE, nitroGLYCERIN, polyethylene glycol, senna-docusate  Physical Exam         Awake alert No distress Has cast on R upper extremity No edema Has some memory loss, word finding difficulty S1 S 2 Clear Abdomen is not distended  Vital Signs: BP (!) 189/95 (BP Location: Left Arm)   Pulse 87   Temp 97.7 F (36.5 C) (Oral)   Resp 13   Ht 5\' 6"  (1.676 m)   Wt 55 kg   SpO2 97%   BMI 19.57 kg/m  SpO2: SpO2: 97 % O2 Device: O2 Device: Room Air O2 Flow Rate:    Intake/output summary:   Intake/Output  Summary (Last 24 hours) at 04/03/2018 1255 Last data filed at 04/03/2018 0933 Gross per 24 hour  Intake 850 ml  Output 450 ml  Net 400 ml   LBM: Last BM Date: (PTA) Baseline Weight: Weight: 55 kg Most recent weight: Weight: 55 kg PPS 30%      Palliative Assessment/Data:    Flowsheet Rows     Most  Recent Value  Intake Tab  Referral Department  Hospitalist  Unit at Time of Referral  ER  Palliative Care Primary Diagnosis  Neurology  Date Notified  04/01/18  Palliative Care Type  New Palliative care  Reason for referral  Clarify Goals of Care  Date of Admission  04/01/18  # of days IP prior to Palliative referral  0  Clinical Assessment  Psychosocial & Spiritual Assessment  Palliative Care Outcomes      Patient Active Problem List   Diagnosis Date Noted  . CVA (cerebral vascular accident) ([place]) 04/02/2018  . Acute CVA (cerebrovascular accident) ([place]) 04/02/2018  . Altered mental status, unspecified 04/01/2018  . Dementia ([place]) 04/01/2018  . Falls 02/21/2018  . Syncope, vasovagal 02/21/2018  . Hyponatremia 12/27/2017  . Traumatic arthritis of ankle, right 08/12/2017  . History of non-ST elevation myocardial infarction (NSTEMI) 12/17/2016  . Chronic anticoagulation 12/17/2016  . Chest pain with moderate risk for cardiac etiology 05/30/2015  . Internal and external hemorrhoids without complication 58/52/7782  . History of GI bleed 06/24/2014  . Chronic diastolic heart failure ([place]) 06/24/2014  . Chronic atrial fibrillation 06/24/2014  . Pre-syncope 05/05/2014  . Osteoarthritis of right knee 08/10/2013  . OSA on CPAP 06/16/2013  . Bradycardia 10/25/2010  . Fatigue 10/25/2010  . Dyspnea 09/18/2010  . Coronary artery disease   . Hypokalemia   . Incontinence of urine   . Stromal tumor of the stomach ([place])   . Dyslipidemia 05/06/2007  . Essential hypertension 05/06/2007  . GERD 05/06/2007  . IRRITABLE BOWEL SYNDROME 05/06/2007    Palliative Care Assessment & Plan    Patient Profile:    Assessment:  84 yo lady with dementia, chronic A fib on Eliquis, HTN HLD CAD. Patient with recurrent falls, ongoing decline since Christmas 2019, fall with R distal radius and ulna fracture. Patient was in independent living, after fall and RUE fracture, she went to rehab. For the past one week prior to current hospitalization, she was at assisted living at [facility].  1. Acute vs early sub acute stroke, based on recent MRI. Call placed and discussed about MRI findings with brother Daje Stark HCPOA agent.  2. Recent wrist fracture 3. Chronic A fib, HTN HLD 4. Gradual progressive decline since the past 1-2 months or so.  Recommendations/Plan:   continue to monitor from PT OT and SLP standpoint, continue neurological evaluation and appropriate stroke work up.   Monitor PO intake, encourage safe PO.   Disposition: SNF rehab with palliative care versus back to ALF with hospice options discussed with brother HCPOA agent Lillie Columbia.     Code Status:    Code Status Orders  (From admission, onward)         Start     Ordered   04/01/18 1639  Do not attempt resuscitation (DNR)  Continuous    Question Answer Comment  In the event of cardiac or respiratory ARREST Do not call a "code blue"   In the event of cardiac or respiratory ARREST Do not perform Intubation, CPR, defibrillation or ACLS   In the event of cardiac or respiratory ARREST Use medication by any route, position, wound care, and other measures to relive pain and suffering. May use oxygen, suction and manual treatment of airway obstruction as needed for comfort.      04/01/18 1643        Code Status History    Date Active Date Inactive Code Status Order ID Comments User Context   02/21/2018  1827 02/28/2018 1606 Full Code [id number]  Roxan Hockey, MD Inpatient   12/27/2017 1636 12/29/2017 1934 Full Code [id number]  Mariel Aloe, MD Inpatient   06/24/2014 1857 Mar 01, 202016 2019 Full Code [id number]   Annita Brod, MD Inpatient   08/10/2013 1555 08/12/2013 1821 Full Code [id number]  Penelope Coop Inpatient    Advance Directive Documentation     Most Recent Value  Type of Advance Directive  Healthcare Power of Attorney  Pre-existing out of facility DNR order (yellow form or pink MOST form)  -  "MOST" Form in Place?  -       Prognosis:   Unable to determine  Discharge Planning:  To Be Determined SNF rehab with palliative versus back to ALF with hospice, based on my discussions with brother HCPOA agent.   Care plan was discussed with  Patient, also discussed with her brother over the phone.   Thank you for allowing the Palliative Medicine Team to assist in the care of this patient.   Time In: 10 Time Out: 10.35 Total Time 35 Prolonged Time Billed  no       Greater than 50%  of this time was spent counseling and coordinating care related to the above assessment and plan.  Loistine Chance, MD [phone] Please contact Palliative Medicine Team phone at [phone] for questions and concerns.

## 2018-04-03 NOTE — Progress Notes (Addendum)
Referring Physician: Dr. Wyonia Hough    Chief Complaint: Confusion  HPI: [name] is an 84 y.o. female with dementia, chronic atrial fibrillation on Eliquis, recurrent falls, right distal radius and ulnar fracture with cast, HTN, HLD and CAD, who initially presented to the hospital with complaints of progressive decline in mental status, oral intake and weight. Over the past 2 days she had been very slow to respond. No seizure-like events were reported. She was sent to the ED from her 23 office as there was a question of a right facial droop with concern for stroke. An MRI was obtained, revealing an acute to subacute lacunar infarction within the right putamen.   Interval HX:  MRI shows tiny stroke in the Rt putamen. EEG neg. Exam much improved. She is bright today, pleasantly confused which seems to be baseline.   Allergies:  Allergies  Allergen Reactions  . Detrol [Tolterodine] Other (See Comments)    Critical reaction-per MD office  . Tranexamic Acid Other (See Comments)    Patient ineligible for Tranexamic acid due to hx of NSTEMI and CAD.  [name] Xarelto [Rivaroxaban] Other (See Comments)    Critical reaction  . Pradaxa [Dabigatran Etexilate Mesylate] Nausea And Vomiting and Other (See Comments)    Moderate reaction-per MD    Medications:  Prior to Admission:  Medications Prior to Admission  Medication Sig Dispense Refill Last Dose  . albuterol (PROVENTIL) (2.5 MG/3ML) 0.083% nebulizer solution Take 3 mLs (2.5 mg total) by nebulization every 2 (two) hours as needed for wheezing. (Patient taking differently: Take 2.5 mg by nebulization 2 (two) times daily as needed for wheezing. ) 75 mL 12 unk at unk  . apixaban (ELIQUIS) 2.5 MG TABS tablet Take 1 tablet (2.5 mg total) by mouth 2 (two) times daily. 60 tablet 0 03/31/2018 at 2000  . atorvastatin (LIPITOR) 20 MG tablet Take 20 mg by mouth every evening.   03/31/2018 at 1700  . bismuth subsalicylate (PEPTO BISMOL) 262 MG/15ML  suspension Take 30 mLs by mouth every 6 (six) hours as needed (for nausea, indigestion, or upset stomach).    unk at unk  . calcium carbonate (TUMS - DOSED IN MG ELEMENTAL CALCIUM) 500 MG chewable tablet Chew 1 tablet by mouth every 8 (eight) hours as needed for indigestion or heartburn.    unk at [employer]  . DULoxetine (CYMBALTA) 20 MG capsule TAKE 1 CAPSULE AT NIGHT FOR 7 NIGHTS THEN TAKE 2 CAPSULES AT NIGHT (Patient taking differently: Take 40 mg by mouth at bedtime. ) 180 capsule 0 03/31/2018 at 2000  . esomeprazole (NEXIUM) 40 MG packet Take 40 mg by mouth daily. DISSOLVE CONTENTS OF 1 PACKET IN 15ML OF WATER AND DRINK EVERY DAY   04/01/2018 at 0600  . feeding supplement, ENSURE ENLIVE, (ENSURE ENLIVE) LIQD Take 237 mLs by mouth 2 (two) times daily between meals. (Patient taking differently: Take 118.5 mLs by mouth 2 (two) times daily. ) 237 mL 0 03/31/2018 at 2000  . galantamine (RAZADYNE ER) 16 MG 24 hr capsule Take 16 mg by mouth daily with breakfast.   03/31/2018 at 0800  . magnesium oxide (MAG-OX) 400 (241.3 Mg) MG tablet Take 1 tablet (400 mg total) by mouth 2 (two) times daily.   03/31/2018 at 2000  . metoprolol succinate (TOPROL-XL) 100 MG 24 hr tablet Take 1 tablet (100 mg total) daily by mouth. Take with or immediately following a meal. 30 tablet 6 03/31/2018 at 0800  . nitroGLYCERIN (NITROSTAT) 0.4 MG SL tablet Place  1 tablet (0.4 mg total) under the tongue every 5 (five) minutes as needed for chest pain. (Patient taking differently: Place 0.4 mg under the tongue every 5 (five) minutes x 3 doses as needed for chest pain. ) 25 tablet prn unk at unk  . polyethylene glycol (MIRALAX / GLYCOLAX) packet Take 17 g by mouth daily as needed for mild constipation. (Patient taking differently: Take 17 g by mouth daily as needed for mild constipation. TO BE MIXED INTO 8 OUNCES OF WATER) 14 each 0 unk at unk  . ondansetron (ZOFRAN-ODT) 4 MG disintegrating tablet Take 4 mg by mouth every 8 (eight) hours as needed for  nausea or vomiting.   Not Taking at Unknown time  . potassium chloride SA (K-DUR,KLOR-CON) 20 MEQ tablet Give potassium chloride 40 mEq p.o. x2 for a total of 80 meq on 02/21/2018, and give 40 mEq p.o. x1 on 02/22/2018 then 20 mEq p.o. x1 on 02/23/2018 (Patient not taking: Reported on 04/01/2018) 8 tablet 0 Not Taking at Unknown time  . ranitidine (ZANTAC) 150 MG tablet Take 150 mg by mouth daily.   Not Taking at Unknown time   Scheduled: .  stroke: mapping our early stages of recovery book   Does not apply Once  . apixaban  2.5 mg Oral BID  . atorvastatin  20 mg Oral Daily  . DULoxetine  40 mg Oral QHS  . feeding supplement (ENSURE ENLIVE)  237 mL Oral BID BM  . galantamine  16 mg Oral Q breakfast  . metoprolol succinate  100 mg Oral Daily  . pantoprazole  40 mg Oral Daily   Continuous: . sodium chloride 75 mL/hr at 04/03/18 0142    ROS: Unable to obtain due to baseline confusion   Physical Examination: Blood pressure (!) 189/95, pulse 87, temperature 97.7 F (36.5 C), temperature source Oral, resp. rate 13, height 5\' 6"  (1.676 m), weight 55 kg, SpO2 97 %.  Appears stated age, no acute distress Lungs: Respirations unlabored HEART: reg rate/rhythm Ext: No edema  Neurologic Examination: Mental Status: Awake and alert. Tells me her name, but unable to answer any other orientation questions. Speech is non-dysarthric but at times consists of nonsensical strings of word-like utterances, at others is clear and fluent. Able to follow simple commands only. Decreased attention, distracts easily with the TV going on behind me. Diminished recall Cranial Nerves: II:  Visual fields intact with no extinction to DSS. PERRL. III,IV, VI: EOMI without nystagmus. No ptosis.  V,VII: smile symmetric, facial temp sensation equal bilaterally VIII: hearing intact to voice IX,X: No hypophonia or hoarseness XI: bilateral shoulder shrug is symmetric and 5/5 XII: midline tongue extension  Motor: Right  : Upper extremity   5/5    Left:     Upper extremity   5/5  Lower extremity   5/5     Lower extremity   5/5 Tone and bulk:normal tone throughout; no atrophy noted Cast on right forearm noted.  No pronator drift.  Sensory: FT intact in all 4 extremities Deep Tendon Reflexes:  2+ bilateral biceps and patellae Plantars: Right: downgoing   Left: downgoing Cerebellar: No ataxia with FNF bilaterally  Gait: Deferred  Results for orders placed or performed during the hospital encounter of 04/01/18 (from the past 48 hour(s))  Urinalysis, Routine w reflex microscopic     Status: Abnormal   Collection Time: 04/01/18  3:25 PM  Result Value Ref Range   Color, Urine YELLOW YELLOW   APPearance HAZY (A) CLEAR  Specific Gravity, Urine 1.017 1.005 - 1.030   pH 6.0 5.0 - 8.0   Glucose, UA NEGATIVE NEGATIVE mg/dL   Hgb urine dipstick NEGATIVE NEGATIVE   Bilirubin Urine NEGATIVE NEGATIVE   Ketones, ur NEGATIVE NEGATIVE mg/dL   Protein, ur NEGATIVE NEGATIVE mg/dL   Nitrite NEGATIVE NEGATIVE   Leukocytes, UA NEGATIVE NEGATIVE    Comment: Performed at [facility] [address], [place], [place] [address]  Urine culture     Status: None   Collection Time: 04/01/18  3:27 PM  Result Value Ref Range   Specimen Description URINE, CATHETERIZED    Special Requests NONE    Culture      NO GROWTH Performed at [facility], [place] [address] [place], [place] [address]    Report Status 04/02/2018 FINAL   Hemoglobin A1c     Status: None   Collection Time: 04/02/18  6:04 AM  Result Value Ref Range   Hgb A1c MFr Bld 5.4 4.8 - 5.6 %    Comment: (NOTE) Pre diabetes:          5.7%-6.4% Diabetes:              >6.4% Glycemic control for   <7.0% adults with diabetes    Mean Plasma Glucose 108.28 mg/dL    Comment: Performed at [facility] [address] [place], [place] [address]  Lipid panel     Status: Abnormal   Collection Time: 04/02/18  6:04 AM  Result Value Ref Range    Cholesterol 144 0 - 200 mg/dL   Triglycerides 54 <150 mg/dL   HDL 35 (L) >40 mg/dL   Total CHOL/HDL Ratio 4.1 RATIO   VLDL 11 0 - 40 mg/dL   LDL Cholesterol 98 0 - 99 mg/dL    Comment:        Total Cholesterol/HDL:CHD Risk Coronary Heart Disease Risk Table                     Men   Women  1/2 Average Risk   3.4   3.3  Average Risk       5.0   4.4  2 X Average Risk   9.6   7.1  3 X Average Risk  23.4   11.0        Use the calculated Patient Ratio above and the CHD Risk Table to determine the patient's CHD Risk.        ATP III CLASSIFICATION (LDL):  <100     mg/dL   Optimal  100-129  mg/dL   Near or Above                    Optimal  130-159  mg/dL   Borderline  160-189  mg/dL   High  >190     mg/dL   Very High Performed at [facility] [address] [place], [place] [address]   Vitamin B12     Status: None   Collection Time: 04/02/18  6:04 AM  Result Value Ref Range   Vitamin B-12 450 180 - 914 pg/mL    Comment: (NOTE) This assay is not validated for testing neonatal or myeloproliferative syndrome specimens for Vitamin B12 levels. Performed at [facility], [place] [address], [place], [place] [address]   TSH     Status: None   Collection Time: 04/02/18  6:04 AM  Result Value Ref Range   TSH 1.398  0.350 - 4.500 uIU/mL    Comment: Performed by a 3rd Generation assay with a functional sensitivity of <=0.01 uIU/mL. Performed at [facility], [place] [address], [place], [place] [address]   CBC with Differential/Platelet     Status: None   Collection Time: 04/02/18  6:04 AM  Result Value Ref Range   WBC 5.2 4.0 - 10.5 K/uL   RBC 4.81 3.87 - 5.11 MIL/uL   Hemoglobin 13.7 12.0 - 15.0 g/dL   HCT 41.9 36.0 - 46.0 %   MCV 87.1 80.0 - 100.0 fL   MCH 28.5 26.0 - 34.0 pg   MCHC 32.7 30.0 - 36.0 g/dL   RDW 12.4 11.5 - 15.5 %   Platelets 225 150 - 400 K/uL   nRBC 0.0 0.0 - 0.2 %   Neutrophils Relative % 62 %   Neutro Abs 3.2 1.7 - 7.7 K/uL   Lymphocytes  Relative 21 %   Lymphs Abs 1.1 0.7 - 4.0 K/uL   Monocytes Relative 14 %   Monocytes Absolute 0.7 0.1 - 1.0 K/uL   Eosinophils Relative 2 %   Eosinophils Absolute 0.1 0.0 - 0.5 K/uL   Basophils Relative 1 %   Basophils Absolute 0.1 0.0 - 0.1 K/uL   Immature Granulocytes 0 %   Abs Immature Granulocytes 0.02 0.00 - 0.07 K/uL    Comment: Performed at [facility], [address] [place], [place] [address]  Comprehensive metabolic panel     Status: Abnormal   Collection Time: 04/02/18  6:04 AM  Result Value Ref Range   Sodium 136 135 - 145 mmol/L   Potassium 3.2 (L) 3.5 - 5.1 mmol/L   Chloride 96 (L) 98 - 111 mmol/L   CO2 27 22 - 32 mmol/L   Glucose, Bld 96 70 - 99 mg/dL   BUN 10 8 - 23 mg/dL   Creatinine, Ser 0.63 0.44 - 1.00 mg/dL   Calcium 9.8 8.9 - 10.3 mg/dL   Total Protein 6.5 6.5 - 8.1 g/dL   Albumin 3.3 (L) 3.5 - 5.0 g/dL   AST 18 15 - 41 U/L   ALT 10 0 - 44 U/L   Alkaline Phosphatase 75 38 - 126 U/L   Total Bilirubin 1.6 (H) 0.3 - 1.2 mg/dL   GFR calc non Af Amer >60 >60 mL/min   GFR calc Af Amer >60 >60 mL/min   Anion gap 13 5 - 15    Comment: Performed at [facility], [place] [address] [place], [place] [address]  Ammonia     Status: None   Collection Time: 04/02/18  6:04 AM  Result Value Ref Range   Ammonia 18 9 - 35 umol/L    Comment: Performed at [facility], [place] [address], [place], [place] [address]  Hemoglobin A1c     Status: None   Collection Time: 04/03/18  5:27 AM  Result Value Ref Range   Hgb A1c MFr Bld 5.4 4.8 - 5.6 %    Comment: (NOTE) Pre diabetes:          5.7%-6.4% Diabetes:              >6.4% Glycemic control for   <7.0% adults with diabetes    Mean Plasma Glucose 108.28 mg/dL    Comment: Performed at [facility] [address] [place], [place] [address]  Lipid panel     Status: Abnormal   Collection Time: 04/03/18  5:27 AM  Result  Value Ref Range   Cholesterol 119 0 - 200 mg/dL   Triglycerides 53 <150 mg/dL   HDL  32 (L) >40 mg/dL   Total CHOL/HDL Ratio 3.7 RATIO   VLDL 11 0 - 40 mg/dL   LDL Cholesterol 76 0 - 99 mg/dL    Comment:        Total Cholesterol/HDL:CHD Risk Coronary Heart Disease Risk Table                     Men   Women  1/2 Average Risk   3.4   3.3  Average Risk       5.0   4.4  2 X Average Risk   9.6   7.1  3 X Average Risk  23.4   11.0        Use the calculated Patient Ratio above and the CHD Risk Table to determine the patient's CHD Risk.        ATP III CLASSIFICATION (LDL):  <100     mg/dL   Optimal  100-129  mg/dL   Near or Above                    Optimal  130-159  mg/dL   Borderline  160-189  mg/dL   High  >190     mg/dL   Very High Performed at [facility] [address] [place], [place] [address]    Ct Head Wo Contrast  Result Date: 04/01/2018 CLINICAL DATA:  Longstanding neuro deficit EXAM: CT HEAD WITHOUT CONTRAST TECHNIQUE: Contiguous axial images were obtained from the base of the skull through the vertex without intravenous contrast. COMPARISON:  02/21/2018 FINDINGS: Brain: Atrophic and chronic white matter ischemic changes are noted similar to that seen on the prior exam. No findings to suggest acute hemorrhage, acute infarction or space-occupying mass lesion are seen. Vascular: No hyperdense vessel or unexpected calcification. Skull: Normal. Negative for fracture or focal lesion. Sinuses/Orbits: No acute finding. Other: None. IMPRESSION: Chronic atrophic and ischemic changes without acute abnormality. Electronically Signed   By: Inez Catalina M.D.   On: 04/01/2018 15:13   Mr Virgel Paling QI Contrast  Result Date: 04/02/2018 CLINICAL DATA:  84 y/o  F; small stroke for follow-up. EXAM: MRA HEAD WITHOUT CONTRAST TECHNIQUE: Angiographic images of the Circle of Willis were obtained using MRA technique without intravenous contrast. COMPARISON:  04/01/2018 MRI head. FINDINGS: Internal carotid arteries:  Patent. Anterior cerebral arteries:  Patent. Middle cerebral  arteries: Patent. Anterior communicating artery: Patent. Posterior communicating arteries: Fetal right PCA. No left posterior communicating artery identified, likely hypoplastic or absent. Posterior cerebral arteries:  Patent. Basilar artery:  Patent. Vertebral arteries:  Patent. Motion degraded study. Suboptimal assessment for subtle stenosis, small (2-3 mm) aneurysm, or small vessels. No large vessel occlusion, large aneurysm, or high-grade stenosis identified. IMPRESSION: Motion degraded study. Suboptimal assessment for subtle stenosis, small (2-3 mm) aneurysm, or small vessels. No large vessel occlusion, large aneurysm, or high-grade stenosis identified. Electronically Signed   By: Kristine Garbe M.D.   On: 04/02/2018 17:48   Mr Brain Wo Contrast  Result Date: 04/01/2018 CLINICAL DATA:  84 y/o F; right facial droop and leading to the right. History of dementia. EXAM: MRI HEAD WITHOUT CONTRAST TECHNIQUE: Multiplanar, multiecho pulse sequences of the brain and surrounding structures were obtained without intravenous contrast. COMPARISON:  04/01/2018 CT head.  09/13/2016 MRI head. FINDINGS: Brain: Motion degradation of multiple sequences. Punctate focus of reduced diffusion within  the right putamen (series 5, image 7, series 7, image 52, and series 8, image 20) compatible with acute/early subacute infarction. No hemorrhage or mass effect. Moderate for age chronic microvascular ischemic changes and volume loss of the brain, progressed from 7/18. Small focus of susceptibility hypointensity within the left occipital lobe compatible hemosiderin deposition of chronic microhemorrhage. No mass effect, extra-axial collection, hydrocephalus, or herniation. Vascular: Normal flow voids. Skull and upper cervical spine: Normal marrow signal. Sinuses/Orbits: Mild mucosal thickening of the anterior ethmoid air cells. Partial opacification of the mastoid tips. Bilateral intra-ocular lens replacements. Other: None.  IMPRESSION: 1. Punctate focus of acute/early subacute infarction in the right putamen. No hemorrhage or mass effect. 2. Progression of chronic microvascular ischemic changes and volume loss of the brain from prior MRI. These results will be called to the ordering clinician or representative by the Radiologist Assistant, and communication documented in the PACS or zVision Dashboard. Electronically Signed   By: Kristine Garbe M.D.   On: 04/01/2018 22:35   Dg Chest Portable 1 View  Result Date: 04/01/2018 CLINICAL DATA:  Altered mental status EXAM: PORTABLE CHEST 1 VIEW COMPARISON:  02/21/2018 and prior chest radiographs FINDINGS: Cardiomegaly again noted. There is no evidence of focal airspace disease, pulmonary edema, suspicious pulmonary nodule/mass, pleural effusion, or pneumothorax. No acute bony abnormalities are identified. IMPRESSION: Cardiomegaly without evidence of acute cardiopulmonary disease. Electronically Signed   By: Margarette [place] M.D.   On: 04/01/2018 16:41    MRI brain: 1. Punctate focus of acute/early subacute infarction in the right putamen. No hemorrhage or mass effect. 2. Progression of chronic microvascular ischemic changes and volume loss of the brain from prior MRI.   Assessment: 84 y.o. female with chronic atrial fibrillation on Eliquis, presenting with acute to subacute lacunar infarction within the right putamen. She presented with progressive decline in mental status, oral intake and weight.  1. DDx regarding etiology for her stroke is cardioembolic in the context of atrial fibrillation versus small vessel disease given the location of the infarction.  2. Progressive decline in mental status, oral intake and weight. This is likely progression of existing dementia and delirium in new setting.   -EEG neg for seizures 4. Stroke Risk Factors - atrial fibrillation, CAD, HTN, HLD and OSA . Continue risk factor medication mgt  Plan: Rehab as tol Stroke wk up complete,  no change in current secondary prevention Continue Eliquis Neuro f/u as out pt if desired DNR/DNI Ok to d/c back to SNF per neuro. We will s/o. Please call back if needed  Desiree Metzger-Cihelka, ARNP-C, ANVP-BC  04/03/2018, 1:33 PM I have personally obtained history,examined this patient, reviewed notes, independently viewed imaging studies, participated in medical decision making and plan of care.ROS completed by me personally and pertinent positives fully documented  I have made any additions or clarifications directly to the above note. Agree with note above. She presented initially with a fall and right forearm fracture and developed mental status changes for a few days and MRI shows a tiny punctate right putaminal infarct likely incidental related to small vessel disease. She also has chronic atrial fibrillation and it and is on eliquis and has some baseline dementia.no family available for discussion. Discussed with Dr. Wyonia Hough. Stroke team will sign off. Kindly call for questions. Greater than 50% time during this 25 minute visit was spent on counseling and coordination of care and discussion with her stroke  Care team.  Antony Contras, MD Medical Director [place] Pager: [phone]  04/03/2018 4:59 PM

## 2018-04-03 NOTE — NC FL2 (Signed)
[place] LEVEL OF CARE SCREENING TOOL     IDENTIFICATION  Patient Name: [name] Birthdate: 05/04/1934 Sex: female Admission Date (Current Location): 04/01/2018  [facility] and [place] Number:  Herbalist and Address:  [facility] [place]. [facility], [place] [address] [place], [place] [address]      Provider Number: [id number]  Attending Physician Name and Address:  Marcell Anger*  Relative Name and Phone Number:       Current Level of Care: Hospital Recommended Level of Care: [place] Prior Approval Number:    Date Approved/Denied:   PASRR Number:   Discharge Plan: Other (Comment)(ALF)    Current Diagnoses: Patient Active Problem List   Diagnosis Date Noted  . CVA (cerebral vascular accident) ([place]) 04/02/2018  . Acute CVA (cerebrovascular accident) ([place]) 04/02/2018  . Altered mental status, unspecified 04/01/2018  . Dementia ([place]) 04/01/2018  . Falls 02/21/2018  . Syncope, vasovagal 02/21/2018  . Hyponatremia 12/27/2017  . Traumatic arthritis of ankle, right 08/12/2017  . History of non-ST elevation myocardial infarction (NSTEMI) 12/17/2016  . Chronic anticoagulation 12/17/2016  . Chest pain with moderate risk for cardiac etiology 05/30/2015  . Internal and external hemorrhoids without complication 61/44/3154  . History of GI bleed 06/24/2014  . Chronic diastolic heart failure ([place]) 06/24/2014  . Chronic atrial fibrillation 06/24/2014  . Pre-syncope 05/05/2014  . Osteoarthritis of right knee 08/10/2013  . OSA on CPAP 06/16/2013  . Bradycardia 10/25/2010  . Fatigue 10/25/2010  . Dyspnea 09/18/2010  . Coronary artery disease   . Hypokalemia   . Incontinence of urine   . Stromal tumor of the stomach ([place])   . Dyslipidemia 05/06/2007  . Essential hypertension 05/06/2007  . GERD 05/06/2007  . IRRITABLE BOWEL SYNDROME 05/06/2007    Orientation RESPIRATION BLADDER Height & Weight     Self  Normal  Incontinent, External catheter Weight: 121 lb 4.1 oz (55 kg) Height:  5\' 6"  (167.6 cm)  BEHAVIORAL SYMPTOMS/MOOD NEUROLOGICAL BOWEL NUTRITION STATUS      Continent Diet(see DC summary)  AMBULATORY STATUS COMMUNICATION OF NEEDS Skin   Extensive Assist Verbally Normal                       Personal Care Assistance Level of Assistance  Bathing, Feeding, Dressing Bathing Assistance: Maximum assistance Feeding assistance: Limited assistance Dressing Assistance: Maximum assistance     Functional Limitations Info  Sight, Hearing, Speech Sight Info: Adequate Hearing Info: Adequate Speech Info: Adequate    SPECIAL CARE FACTORS FREQUENCY  PT (by licensed PT), OT (by licensed OT)     PT Frequency: 3x/wk with home health OT Frequency: 3x/wk with home health           Contractures Contractures Info: Not present    Additional Factors Info  Code Status, Allergies, Psychotropic Code Status Info: DNR Allergies Info: Detrol Tolterodine, Tranexamic Acid, Xarelto Rivaroxaban, Pradaxa Dabigatran Etexilate Mesylate Psychotropic Info: Cymbalta 40mg  daily at bed         Current Medications (04/03/2018):  This is the current hospital active medication list Current Facility-Administered Medications  Medication Dose Route Frequency Provider Last Rate Last Dose  .  stroke: mapping our early stages of recovery book   Does not apply Once Aline August, MD      . 0.9 %  sodium chloride infusion   Intravenous Continuous Aline August, MD 75 mL/hr at 04/03/18 0142    . acetaminophen (TYLENOL) tablet 650 mg  650 mg Oral Q4H PRN Aline August, MD       Or  . acetaminophen (TYLENOL) solution 650 mg  650 mg Per Tube Q4H PRN Aline August, MD       Or  . acetaminophen (TYLENOL) suppository 650 mg  650 mg Rectal Q4H PRN Alekh, Kshitiz, MD      . albuterol (PROVENTIL) (2.5 MG/3ML) 0.083% nebulizer solution 2.5 mg  2.5 mg Nebulization Q2H PRN Alekh, Kshitiz, MD      . apixaban (ELIQUIS) tablet  2.5 mg  2.5 mg Oral BID Starla Link, Kshitiz, MD   2.5 mg at 04/03/18 1047  . atorvastatin (LIPITOR) tablet 20 mg  20 mg Oral Daily Starla Link, Kshitiz, MD   20 mg at 04/03/18 1046  . calcium carbonate (TUMS - dosed in mg elemental calcium) chewable tablet 200 mg of elemental calcium  1 tablet Oral TID PRN Aline August, MD      . DULoxetine (CYMBALTA) DR capsule 40 mg  40 mg Oral QHS Aline August, MD   40 mg at 04/02/18 2221  . feeding supplement (ENSURE ENLIVE) (ENSURE ENLIVE) liquid 237 mL  237 mL Oral BID BM Marcell Anger, MD   237 mL at 04/03/18 1057  . galantamine (RAZADYNE ER) 24 hr capsule 16 mg  16 mg Oral Q breakfast Alekh, Kshitiz, MD   16 mg at 04/03/18 0746  . hydrALAZINE (APRESOLINE) injection 5 mg  5 mg Intravenous Q6H PRN Aline August, MD   5 mg at 04/02/18 1631  . metoprolol succinate (TOPROL-XL) 24 hr tablet 100 mg  100 mg Oral Daily Alekh, Kshitiz, MD   100 mg at 04/03/18 1047  . nitroGLYCERIN (NITROSTAT) SL tablet 0.4 mg  0.4 mg Sublingual Q5 min PRN Alekh, Kshitiz, MD      . pantoprazole (PROTONIX) EC tablet 40 mg  40 mg Oral Daily Alekh, Kshitiz, MD   40 mg at 04/03/18 1046  . polyethylene glycol (MIRALAX / GLYCOLAX) packet 17 g  17 g Oral Daily PRN Aline August, MD      . senna-docusate (Senokot-S) tablet 1 tablet  1 tablet Oral QHS PRN Aline August, MD   1 tablet at 04/02/18 2222     Discharge Medications: Please see discharge summary for a list of discharge medications.  Relevant Imaging Results:  Relevant Lab Results:   Additional Information SS#: [id number]  Geralynn Ochs, LCSW

## 2018-04-03 NOTE — Progress Notes (Addendum)
Pt is pleasantly confused pulling of fcardiac monitor ,gown and  attempting  To get oob. Reoriented and redirected  But not successful nurse has to stay at bed side to  Redirect frequently. Pt stating " I got to get home ,I got to get back to work."   activity pad given to play with but pt refused. Will notify MD.

## 2018-04-03 NOTE — Progress Notes (Signed)
PROGRESS NOTE    [name]  AST:[id number] DOB: Jul 14, 1934 DOA: 04/01/2018 PCP: Crist Infante, MD   Brief Narrative:  Sabrina Po Sheltonis a 84 y.o.femalewith medical history significant ofdementia, chronic atrial fibrillation on Eliquis, hypertension, hyperlipidemia, coronary artery disease, right distal radius and ulna fracture currently with a cast, recurrent falls who was sent from her doctor's office with a concern for stroke. Patient is confused from her dementia and does not provide much history. Relatives/family members at bedside provides some history. Patient was discharged to skilled nursing facility in early January 2020 for rehab for recurrent falls and right distal radius and ulna fracture. Subsequently she was discharged back to assisted living facility almost a week ago. Family members are concerned that her mental status has progressively declined with rapid decline in her oral intake and her weight as well. Over the last couple of days, she was very slow to respond. There was no known seizure-like activities or bowel bladder incontinence. No recent fever, nausea or vomiting reported. At her doctor's office, there was a question of right facial droop so she was sent to the ED for concern for stroke.   Assessment & Plan:   Principal Problem:   Altered mental status, unspecified Active Problems:   Dyslipidemia   Essential hypertension   Chronic atrial fibrillation   Dementia (HCC)   CVA (cerebral vascular accident) ([place])   Acute CVA (cerebrovascular accident) ([place])   Acute vs subacute stroke -As previously noted, there was a question of facial droop in the doctor's office which was concerning for stroke. Ruled in stroke. -CT of the head was negative for abnormality.  Double for acute versus early subacute stroke, 2D echo recently done in December as were Ultrasound of carotids. -PT/OT/SLP evaluation appreciated, recs for snf -EEG pleaded showing mild to  moderate global slowing consider global encephalopathy -Vitamin B12 - 450 /folate pend /TSH 1.4 /ammonia 18 -Fall precautions. -Patient's overall general medical condition has been declining pretty quickly recently as per the family. Her appetite is poor. They do not want feeding tube.   Follow-up palliative care consultation   chronic atrial fibrillation -Currently rate controlled. Continue Eliquis.  Hypertension -Monitor blood pressure. Hold antihypertensives for now, start to titrate back in  Dyslipidemia -Continue statin  Recent right distal radius and ulnar fracture -Currently has a cast. Outpatient follow-up with orthopedics  DVT prophylaxis: [id number]  Code Status: DNR    Code Status Orders  (From admission, onward)         Start     Ordered   04/01/18 1639  Do not attempt resuscitation (DNR)  Continuous    Question Answer Comment  In the event of cardiac or respiratory ARREST Do not call a "code blue"   In the event of cardiac or respiratory ARREST Do not perform Intubation, CPR, defibrillation or ACLS   In the event of cardiac or respiratory ARREST Use medication by any route, position, wound care, and other measures to relive pain and suffering. May use oxygen, suction and manual treatment of airway obstruction as needed for comfort.      04/01/18 1643        Code Status History    Date Active Date Inactive Code Status Order ID Comments User Context   02/21/2018 1827 02/28/2018 1606 Full Code [id number]  Roxan Hockey, MD Inpatient   12/27/2017 1636 12/29/2017 1934 Full Code [id number]  Mariel Aloe, MD Inpatient   06/24/2014 1857 2020/08/1214 2019 Full Code [id number]  Annita Brod,  MD Inpatient   08/10/2013 1555 08/12/2013 1821 Full Code [id number]  Penelope Coop Inpatient    Advance Directive Documentation     Most Recent Value  Type of Advance Directive  Healthcare Power of Attorney  Pre-existing out of facility DNR order (yellow form or  pink MOST form)  -  "MOST" Form in Place?  -     Family Communication: Present, case manager discussing case with healthcare power of attorney Disposition Plan: Return to ALF versus skilled nursing facility   Consults called: Neurology Admission status: Inpatient   Consultants:   Neurology  Procedures:  Ct Head Wo Contrast  Result Date: 04/01/2018 CLINICAL DATA:  Longstanding neuro deficit EXAM: CT HEAD WITHOUT CONTRAST TECHNIQUE: Contiguous axial images were obtained from the base of the skull through the vertex without intravenous contrast. COMPARISON:  02/21/2018 FINDINGS: Brain: Atrophic and chronic white matter ischemic changes are noted similar to that seen on the prior exam. No findings to suggest acute hemorrhage, acute infarction or space-occupying mass lesion are seen. Vascular: No hyperdense vessel or unexpected calcification. Skull: Normal. Negative for fracture or focal lesion. Sinuses/Orbits: No acute finding. Other: None. IMPRESSION: Chronic atrophic and ischemic changes without acute abnormality. Electronically Signed   By: Inez Catalina M.D.   On: 04/01/2018 15:13   Mr Virgel Paling MH Contrast  Result Date: 04/02/2018 CLINICAL DATA:  84 y/o  F; small stroke for follow-up. EXAM: MRA HEAD WITHOUT CONTRAST TECHNIQUE: Angiographic images of the Circle of Willis were obtained using MRA technique without intravenous contrast. COMPARISON:  04/01/2018 MRI head. FINDINGS: Internal carotid arteries:  Patent. Anterior cerebral arteries:  Patent. Middle cerebral arteries: Patent. Anterior communicating artery: Patent. Posterior communicating arteries: Fetal right PCA. No left posterior communicating artery identified, likely hypoplastic or absent. Posterior cerebral arteries:  Patent. Basilar artery:  Patent. Vertebral arteries:  Patent. Motion degraded study. Suboptimal assessment for subtle stenosis, small (2-3 mm) aneurysm, or small vessels. No large vessel occlusion, large aneurysm, or  high-grade stenosis identified. IMPRESSION: Motion degraded study. Suboptimal assessment for subtle stenosis, small (2-3 mm) aneurysm, or small vessels. No large vessel occlusion, large aneurysm, or high-grade stenosis identified. Electronically Signed   By: Kristine Garbe M.D.   On: 04/02/2018 17:48   Mr Brain Wo Contrast  Result Date: 04/01/2018 CLINICAL DATA:  84 y/o F; right facial droop and leading to the right. History of dementia. EXAM: MRI HEAD WITHOUT CONTRAST TECHNIQUE: Multiplanar, multiecho pulse sequences of the brain and surrounding structures were obtained without intravenous contrast. COMPARISON:  04/01/2018 CT head.  09/13/2016 MRI head. FINDINGS: Brain: Motion degradation of multiple sequences. Punctate focus of reduced diffusion within the right putamen (series 5, image 7, series 7, image 52, and series 8, image 20) compatible with acute/early subacute infarction. No hemorrhage or mass effect. Moderate for age chronic microvascular ischemic changes and volume loss of the brain, progressed from 7/18. Small focus of susceptibility hypointensity within the left occipital lobe compatible hemosiderin deposition of chronic microhemorrhage. No mass effect, extra-axial collection, hydrocephalus, or herniation. Vascular: Normal flow voids. Skull and upper cervical spine: Normal marrow signal. Sinuses/Orbits: Mild mucosal thickening of the anterior ethmoid air cells. Partial opacification of the mastoid tips. Bilateral intra-ocular lens replacements. Other: None. IMPRESSION: 1. Punctate focus of acute/early subacute infarction in the right putamen. No hemorrhage or mass effect. 2. Progression of chronic microvascular ischemic changes and volume loss of the brain from prior MRI. These results will be called to the ordering clinician or representative by the  Psychologist, clinical, and communication documented in the PACS or zVision Dashboard. Electronically Signed   By: Kristine Garbe  M.D.   On: 04/01/2018 22:35   Dg Chest Portable 1 View  Result Date: 04/01/2018 CLINICAL DATA:  Altered mental status EXAM: PORTABLE CHEST 1 VIEW COMPARISON:  02/21/2018 and prior chest radiographs FINDINGS: Cardiomegaly again noted. There is no evidence of focal airspace disease, pulmonary edema, suspicious pulmonary nodule/mass, pleural effusion, or pneumothorax. No acute bony abnormalities are identified. IMPRESSION: Cardiomegaly without evidence of acute cardiopulmonary disease. Electronically Signed   By: Margarette Canada M.D.   On: 04/01/2018 16:41     Antimicrobials:   none    Subjective: Patient much improved this morning awake alert answering questions although pleasantly confused  Objective: Vitals:   04/02/18 2255 04/03/18 0311 04/03/18 0737 04/03/18 1537  BP: (!) 149/88 (!) 151/83 (!) 189/95 (!) 190/94  Pulse: 81 91 87 85  Resp: 20 20 13 18   Temp: 98 F (36.7 C) 98.3 F (36.8 C) 97.7 F (36.5 C)   TempSrc: Oral Oral Oral   SpO2: 98% 98% 97% 97%  Weight:      Height:        Intake/Output Summary (Last 24 hours) at 04/03/2018 1729 Last data filed at 04/03/2018 0933 Gross per 24 hour  Intake 850 ml  Output 450 ml  Net 400 ml   Filed Weights   04/01/18 1143  Weight: 55 kg    Examination:  General exam: Appears calm and comfortable, pleasantly confused Respiratory system: Clear to auscultation. Respiratory effort normal. Cardiovascular system: S1 & S2 heard, RRR. No JVD, murmurs, rubs, gallops or clicks. No pedal edema. Gastrointestinal system: Abdomen is nondistended, soft and nontender. No organomegaly or masses felt. Normal bowel sounds heard. Central nervous system: Alert and oriented. No focal neurological deficits. Extremities: Symmetric 5 x 5 power. Skin: No rashes, lesions or ulcers Psychiatry: Judgment and insight poor secondary to known chronic dementia, pleasant mood    Data Reviewed: I have personally reviewed following labs and imaging  studies  CBC: Recent Labs  Lab 04/01/18 1205 04/02/18 0604  WBC 6.9 5.2  NEUTROABS 4.6 3.2  HGB 14.5 13.7  HCT 45.1 41.9  43.7  MCV 89.8 87.1  PLT 208 062   Basic Metabolic Panel: Recent Labs  Lab 04/01/18 1205 04/02/18 0604  NA 136 136  K 3.8 3.2*  CL 95* 96*  CO2 28 27  GLUCOSE 106* 96  BUN 15 10  CREATININE 0.97 0.63  CALCIUM 10.4* 9.8   GFR: Estimated Creatinine Clearance: 47.9 mL/min (by C-G formula based on SCr of 0.63 mg/dL). Liver Function Tests: Recent Labs  Lab 04/01/18 1205 04/02/18 0604  AST 21 18  ALT 9 10  ALKPHOS 82 75  BILITOT 1.3* 1.6*  PROT 7.1 6.5  ALBUMIN 3.6 3.3*   No results for input(s): LIPASE, AMYLASE in the last 168 hours. Recent Labs  Lab 04/02/18 0604  AMMONIA 18   Coagulation Profile: Recent Labs  Lab 04/01/18 1205  INR 1.21   Cardiac Enzymes: No results for input(s): CKTOTAL, CKMB, CKMBINDEX, TROPONINI in the last 168 hours. BNP (last 3 results) No results for input(s): PROBNP in the last 8760 hours. HbA1C: Recent Labs    04/02/18 0604 04/03/18 0527  HGBA1C 5.4 5.4   CBG: Recent Labs  Lab 04/01/18 1158  GLUCAP 102*   Lipid Profile: Recent Labs    04/02/18 0604 04/03/18 0527  CHOL 144 119  HDL 35* 32*  LDLCALC 98  76  TRIG 54 53  CHOLHDL 4.1 3.7   Thyroid Function Tests: Recent Labs    04/02/18 0604  TSH 1.398   Anemia Panel: Recent Labs    04/02/18 0604  VITAMINB12 450   Sepsis Labs: No results for input(s): PROCALCITON, LATICACIDVEN in the last 168 hours.  Recent Results (from the past 240 hour(s))  Urine culture     Status: None   Collection Time: 04/01/18  3:27 PM  Result Value Ref Range Status   Specimen Description URINE, CATHETERIZED  Final   Special Requests NONE  Final   Culture   Final    NO GROWTH Performed at [facility], [address] [place], [place] [address]    Report Status 04/02/2018 FINAL  Final         Radiology Studies: Mr Virgel Paling TW  Contrast  Result Date: 04/02/2018 CLINICAL DATA:  84 y/o  F; small stroke for follow-up. EXAM: MRA HEAD WITHOUT CONTRAST TECHNIQUE: Angiographic images of the Circle of Willis were obtained using MRA technique without intravenous contrast. COMPARISON:  04/01/2018 MRI head. FINDINGS: Internal carotid arteries:  Patent. Anterior cerebral arteries:  Patent. Middle cerebral arteries: Patent. Anterior communicating artery: Patent. Posterior communicating arteries: Fetal right PCA. No left posterior communicating artery identified, likely hypoplastic or absent. Posterior cerebral arteries:  Patent. Basilar artery:  Patent. Vertebral arteries:  Patent. Motion degraded study. Suboptimal assessment for subtle stenosis, small (2-3 mm) aneurysm, or small vessels. No large vessel occlusion, large aneurysm, or high-grade stenosis identified. IMPRESSION: Motion degraded study. Suboptimal assessment for subtle stenosis, small (2-3 mm) aneurysm, or small vessels. No large vessel occlusion, large aneurysm, or high-grade stenosis identified. Electronically Signed   By: Kristine Garbe M.D.   On: 04/02/2018 17:48   Mr Brain Wo Contrast  Result Date: 04/01/2018 CLINICAL DATA:  84 y/o F; right facial droop and leading to the right. History of dementia. EXAM: MRI HEAD WITHOUT CONTRAST TECHNIQUE: Multiplanar, multiecho pulse sequences of the brain and surrounding structures were obtained without intravenous contrast. COMPARISON:  04/01/2018 CT head.  09/13/2016 MRI head. FINDINGS: Brain: Motion degradation of multiple sequences. Punctate focus of reduced diffusion within the right putamen (series 5, image 7, series 7, image 52, and series 8, image 20) compatible with acute/early subacute infarction. No hemorrhage or mass effect. Moderate for age chronic microvascular ischemic changes and volume loss of the brain, progressed from 7/18. Small focus of susceptibility hypointensity within the left occipital lobe compatible  hemosiderin deposition of chronic microhemorrhage. No mass effect, extra-axial collection, hydrocephalus, or herniation. Vascular: Normal flow voids. Skull and upper cervical spine: Normal marrow signal. Sinuses/Orbits: Mild mucosal thickening of the anterior ethmoid air cells. Partial opacification of the mastoid tips. Bilateral intra-ocular lens replacements. Other: None. IMPRESSION: 1. Punctate focus of acute/early subacute infarction in the right putamen. No hemorrhage or mass effect. 2. Progression of chronic microvascular ischemic changes and volume loss of the brain from prior MRI. These results will be called to the ordering clinician or representative by the Radiologist Assistant, and communication documented in the PACS or zVision Dashboard. Electronically Signed   By: Kristine Garbe M.D.   On: 04/01/2018 22:35        Scheduled Meds: .  stroke: mapping our early stages of recovery book   Does not apply Once  . apixaban  2.5 mg Oral BID  . atorvastatin  20 mg Oral Daily  . DULoxetine  40 mg Oral QHS  . feeding supplement (ENSURE ENLIVE)  237 mL Oral BID BM  . galantamine  16 mg Oral Q breakfast  . metoprolol succinate  100 mg Oral Daily  . pantoprazole  40 mg Oral Daily   Continuous Infusions: . sodium chloride 75 mL/hr at 04/03/18 0142     LOS: 1 day    Time spent: 23 min    Nicolette Bang, MD [facility]  If 7PM-7AM, please contact night-coverage  04/03/2018, 5:29 PM

## 2018-04-03 NOTE — Clinical Social Work Note (Signed)
Clinical Social Work Assessment  Patient Details  Name: [name] MRN: [id number] Date of Birth: 1935-01-10  Date of referral:  04/03/18               Reason for consult:  Facility Placement                Permission sought to share information with:  Facility Sport and exercise psychologist, Family Supports Permission granted to share information::  Yes, Verbal Permission Granted  Name::     Comptroller::  [place]  Relationship::  Power of Building surveyor Information:     Housing/Transportation Living arrangements for the past 2 months:  [place], [place] of Information:  Medical Team, Power of Attorney Patient Interpreter Needed:  None Criminal Activity/Legal Involvement Pertinent to Current Situation/Hospitalization:  No - Comment as needed Significant Relationships:  Other Family Members, Friend Lives with:  Self, Facility Resident Do you feel safe going back to the place where you live?  Yes Need for family participation in patient care:  Yes (Comment)  Care giving concerns:  Patient from [place], and family has no concerns about care received. Patient recently transitioned to ALF from IDL at [facility], after short rehab stay after breaking her arm, but patient's family didn't feel like it made much of a difference for the patient.    Social Worker assessment / plan:  CSW spoke with patient's power of attorney, Rush Landmark, to discuss discharge plan. Bill provided brief summary of the patient's past few months, and how the main goal for her care is to keep her as happy and comfortable as possible. CSW discussed recommendation for SNF with Rush Landmark and Rush Landmark was not in agreement, he wanted to see if patient could return to her home environment at [facility] in ALF instead. CSW reached out to ALF, faxed clinical information, and awaiting decision from ALF on if they can continue to care for the patient. CSW to follow.  Employment status:   Retired Nurse, adult PT Recommendations:  [place], [place] / Referral to community resources:     Patient/Family's Response to care:  Patient's POA hopeful for patient to return to ALF as he did not find SNF very helpful for her.  Patient/Family's Understanding of and Emotional Response to Diagnosis, Current Treatment, and Prognosis:  Patient's POA involved in patient's care and aware of her deficits. Patient's POA discussed what patient was able to do and what the nurses helped her with over at [facility], and said that he thought she seemed about the same with what she had been like lately to be able to return. Patient's POA hopeful that they will be able to take her back, as he just wants her to be happy for the life that she has left.  Emotional Assessment Appearance:  Appears stated age Attitude/Demeanor/Rapport:  Unable to Assess Affect (typically observed):  Unable to Assess Orientation:  Oriented to Self Alcohol / Substance use:  Not Applicable Psych involvement (Current and /or in the community):  No (Comment)  Discharge Needs  Concerns to be addressed:  Care Coordination Readmission within the last 30 days:  No Current discharge risk:  Physical Impairment, Cognitively Impaired, Dependent with Mobility Barriers to Discharge:  Continued Medical Work up   [employer], [place] 04/03/2018, 2:35 PM

## 2018-04-03 NOTE — NC FL2 (Signed)
[place] LEVEL OF CARE SCREENING TOOL     IDENTIFICATION  Patient Name: [name] Birthdate: [birth year] Sex: female Admission Date (Current Location): 04/01/2018  [facility] and [place] Number:  Herbalist and Address:  [facility], [place] [address] [place], [place] [address]      Provider Number: [id number]  Attending Physician Name and Address:  Marcell Anger*  Relative Name and Phone Number:       Current Level of Care: Hospital Recommended Level of Care: [place] Prior Approval Number:    Date Approved/Denied:   PASRR Number: [id number]  Discharge Plan: Other (Comment)(ALF)    Current Diagnoses: Patient Active Problem List   Diagnosis Date Noted  . Palliative care by specialist   . Goals of care, counseling/discussion   . Advanced care planning/counseling discussion   . CVA (cerebral vascular accident) ([place]) 04/02/2018  . Acute CVA (cerebrovascular accident) ([place]) 04/02/2018  . Altered mental status, unspecified 04/01/2018  . Dementia ([place]) 04/01/2018  . Falls 02/21/2018  . Syncope, vasovagal 02/21/2018  . Hyponatremia 12/27/2017  . Traumatic arthritis of ankle, right 08/12/2017  . History of non-ST elevation myocardial infarction (NSTEMI) 12/17/2016  . Chronic anticoagulation 12/17/2016  . Chest pain with moderate risk for cardiac etiology 05/30/2015  . Internal and external hemorrhoids without complication 56/21/3086  . History of GI bleed 06/24/2014  . Chronic diastolic heart failure ([place]) 06/24/2014  . Chronic atrial fibrillation 06/24/2014  . Pre-syncope 05/05/2014  . Osteoarthritis of right knee 08/10/2013  . OSA on CPAP 06/16/2013  . Bradycardia 10/25/2010  . Fatigue 10/25/2010  . Dyspnea 09/18/2010  . Coronary artery disease   . Hypokalemia   . Incontinence of urine   . Stromal tumor of the stomach ([place])   . Dyslipidemia 05/06/2007  . Essential hypertension 05/06/2007   . GERD 05/06/2007  . IRRITABLE BOWEL SYNDROME 05/06/2007    Orientation RESPIRATION BLADDER Height & Weight     Self  Normal Incontinent Weight: 121 lb 4.1 oz (55 kg) Height:  5\' 6"  (167.6 cm)  BEHAVIORAL SYMPTOMS/MOOD NEUROLOGICAL BOWEL NUTRITION STATUS      Continent (regular)  AMBULATORY STATUS COMMUNICATION OF NEEDS Skin   Extensive Assist Verbally Normal                       Personal Care Assistance Level of Assistance  Bathing, Feeding, Dressing Bathing Assistance: Maximum assistance Feeding assistance: Limited assistance Dressing Assistance: Maximum assistance     Functional Limitations Info  Sight, Hearing, Speech Sight Info: Adequate Hearing Info: Adequate Speech Info: Adequate    SPECIAL CARE FACTORS FREQUENCY  PT (By licensed PT), OT (By licensed OT), Speech therapy     PT Frequency: 3x/wk with home health OT Frequency: 3x/wk with home health     Speech Therapy Frequency: 5x/wk      Contractures Contractures Info: Not present    Additional Factors Info  Code Status, Allergies, Psychotropic Code Status Info: DNR Allergies Info: Detrol Tolterodine, Tranexamic Acid, Xarelto Rivaroxaban, Pradaxa Dabigatran Etexilate Mesylate Psychotropic Info: Cymbalta 40mg  daily at bed         Current Medications (04/03/2018):  This is the current hospital active medication list Current Facility-Administered Medications  Medication Dose Route Frequency Provider Last Rate Last Dose  .  stroke: mapping our early stages of recovery book   Does not apply Once Alekh, Kshitiz, MD      . 0.9 %  sodium chloride  infusion   Intravenous Continuous Aline August, MD 75 mL/hr at 04/03/18 0142    . acetaminophen (TYLENOL) tablet 650 mg  650 mg Oral Q4H PRN Aline August, MD       Or  . acetaminophen (TYLENOL) solution 650 mg  650 mg Per Tube Q4H PRN Aline August, MD       Or  . acetaminophen (TYLENOL) suppository 650 mg  650 mg Rectal Q4H PRN Alekh, Kshitiz, MD      .  albuterol (PROVENTIL) (2.5 MG/3ML) 0.083% nebulizer solution 2.5 mg  2.5 mg Nebulization Q2H PRN Alekh, Kshitiz, MD      . apixaban (ELIQUIS) tablet 2.5 mg  2.5 mg Oral BID Starla Link, Kshitiz, MD   2.5 mg at 04/03/18 1047  . atorvastatin (LIPITOR) tablet 20 mg  20 mg Oral Daily Starla Link, Kshitiz, MD   20 mg at 04/03/18 1046  . calcium carbonate (TUMS - dosed in mg elemental calcium) chewable tablet 200 mg of elemental calcium  1 tablet Oral TID PRN Aline August, MD      . DULoxetine (CYMBALTA) DR capsule 40 mg  40 mg Oral QHS Aline August, MD   40 mg at 04/02/18 2221  . feeding supplement (ENSURE ENLIVE) (ENSURE ENLIVE) liquid 237 mL  237 mL Oral BID BM Marcell Anger, MD   237 mL at 04/03/18 1057  . galantamine (RAZADYNE ER) 24 hr capsule 16 mg  16 mg Oral Q breakfast Alekh, Kshitiz, MD   16 mg at 04/03/18 0746  . hydrALAZINE (APRESOLINE) injection 5 mg  5 mg Intravenous Q6H PRN Aline August, MD   5 mg at 04/02/18 1631  . metoprolol succinate (TOPROL-XL) 24 hr tablet 100 mg  100 mg Oral Daily Alekh, Kshitiz, MD   100 mg at 04/03/18 1047  . nitroGLYCERIN (NITROSTAT) SL tablet 0.4 mg  0.4 mg Sublingual Q5 min PRN Alekh, Kshitiz, MD      . pantoprazole (PROTONIX) EC tablet 40 mg  40 mg Oral Daily Alekh, Kshitiz, MD   40 mg at 04/03/18 1046  . polyethylene glycol (MIRALAX / GLYCOLAX) packet 17 g  17 g Oral Daily PRN Aline August, MD      . senna-docusate (Senokot-S) tablet 1 tablet  1 tablet Oral QHS PRN Aline August, MD   1 tablet at 04/02/18 2222     Discharge Medications: Please see discharge summary for a list of discharge medications.  Relevant Imaging Results:  Relevant Lab Results:   Additional Information SS#: [id number]  Geralynn Ochs, LCSW

## 2018-04-03 NOTE — NC FL2 (Signed)
[place] LEVEL OF CARE SCREENING TOOL     IDENTIFICATION  Patient Name: [name] Birthdate: 1934-06-25 Sex: female Admission Date (Current Location): 04/01/2018  [facility] and [place] Number:  Herbalist and Address:  [facility] [place]. [facility], [place] [address] [place], [place] [address]      Provider Number: [id number]  Attending Physician Name and Address:  Marcell Anger*  Relative Name and Phone Number:       Current Level of Care: Hospital Recommended Level of Care: [place] Prior Approval Number:    Date Approved/Denied:   PASRR Number: [id number]  Discharge Plan: SNF    Current Diagnoses: Patient Active Problem List   Diagnosis Date Noted  . CVA (cerebral vascular accident) ([place]) 04/02/2018  . Acute CVA (cerebrovascular accident) ([place]) 04/02/2018  . Altered mental status, unspecified 04/01/2018  . Dementia ([place]) 04/01/2018  . Falls 02/21/2018  . Syncope, vasovagal 02/21/2018  . Hyponatremia 12/27/2017  . Traumatic arthritis of ankle, right 08/12/2017  . History of non-ST elevation myocardial infarction (NSTEMI) 12/17/2016  . Chronic anticoagulation 12/17/2016  . Chest pain with moderate risk for cardiac etiology 05/30/2015  . Internal and external hemorrhoids without complication 37/62/8315  . History of GI bleed 06/24/2014  . Chronic diastolic heart failure ([place]) 06/24/2014  . Chronic atrial fibrillation 06/24/2014  . Pre-syncope 05/05/2014  . Osteoarthritis of right knee 08/10/2013  . OSA on CPAP 06/16/2013  . Bradycardia 10/25/2010  . Fatigue 10/25/2010  . Dyspnea 09/18/2010  . Coronary artery disease   . Hypokalemia   . Incontinence of urine   . Stromal tumor of the stomach ([place])   . Dyslipidemia 05/06/2007  . Essential hypertension 05/06/2007  . GERD 05/06/2007  . IRRITABLE BOWEL SYNDROME 05/06/2007    Orientation RESPIRATION BLADDER Height & Weight     Self  Normal  Incontinent, External catheter Weight: 121 lb 4.1 oz (55 kg) Height:  5\' 6"  (167.6 cm)  BEHAVIORAL SYMPTOMS/MOOD NEUROLOGICAL BOWEL NUTRITION STATUS      Continent Diet(see DC summary)  AMBULATORY STATUS COMMUNICATION OF NEEDS Skin   Extensive Assist Verbally Normal                       Personal Care Assistance Level of Assistance  Bathing, Feeding, Dressing Bathing Assistance: Maximum assistance Feeding assistance: Limited assistance Dressing Assistance: Maximum assistance     Functional Limitations Info  Sight, Hearing, Speech Sight Info: Adequate Hearing Info: Adequate Speech Info: Adequate    SPECIAL CARE FACTORS FREQUENCY  PT (By licensed PT), OT (By licensed OT), Speech therapy     PT Frequency: 5x/wk OT Frequency: 5x/wk     Speech Therapy Frequency: 5x/wk      Contractures Contractures Info: Not present    Additional Factors Info  Code Status, Allergies, Psychotropic Code Status Info: DNR Allergies Info: Detrol Tolterodine, Tranexamic Acid, Xarelto Rivaroxaban, Pradaxa Dabigatran Etexilate Mesylate Psychotropic Info: Cymbalta 40mg  daily at bed         Current Medications (04/03/2018):  This is the current hospital active medication list Current Facility-Administered Medications  Medication Dose Route Frequency Provider Last Rate Last Dose  .  stroke: mapping our early stages of recovery book   Does not apply Once Aline August, MD      . 0.9 %  sodium chloride infusion   Intravenous Continuous Aline August, MD 75 mL/hr at 04/03/18 0142    . acetaminophen (TYLENOL) tablet 650 mg  650  mg Oral Q4H PRN Aline August, MD       Or  . acetaminophen (TYLENOL) solution 650 mg  650 mg Per Tube Q4H PRN Aline August, MD       Or  . acetaminophen (TYLENOL) suppository 650 mg  650 mg Rectal Q4H PRN Alekh, Kshitiz, MD      . albuterol (PROVENTIL) (2.5 MG/3ML) 0.083% nebulizer solution 2.5 mg  2.5 mg Nebulization Q2H PRN Alekh, Kshitiz, MD      . apixaban  (ELIQUIS) tablet 2.5 mg  2.5 mg Oral BID Starla Link, Kshitiz, MD   2.5 mg at 04/03/18 1047  . atorvastatin (LIPITOR) tablet 20 mg  20 mg Oral Daily Starla Link, Kshitiz, MD   20 mg at 04/03/18 1046  . calcium carbonate (TUMS - dosed in mg elemental calcium) chewable tablet 200 mg of elemental calcium  1 tablet Oral TID PRN Aline August, MD      . DULoxetine (CYMBALTA) DR capsule 40 mg  40 mg Oral QHS Aline August, MD   40 mg at 04/02/18 2221  . feeding supplement (ENSURE ENLIVE) (ENSURE ENLIVE) liquid 237 mL  237 mL Oral BID BM Marcell Anger, MD   237 mL at 04/02/18 1634  . galantamine (RAZADYNE ER) 24 hr capsule 16 mg  16 mg Oral Q breakfast Alekh, Kshitiz, MD   16 mg at 04/03/18 0746  . hydrALAZINE (APRESOLINE) injection 5 mg  5 mg Intravenous Q6H PRN Aline August, MD   5 mg at 04/02/18 1631  . metoprolol succinate (TOPROL-XL) 24 hr tablet 100 mg  100 mg Oral Daily Alekh, Kshitiz, MD   100 mg at 04/03/18 1047  . nitroGLYCERIN (NITROSTAT) SL tablet 0.4 mg  0.4 mg Sublingual Q5 min PRN Alekh, Kshitiz, MD      . pantoprazole (PROTONIX) EC tablet 40 mg  40 mg Oral Daily Alekh, Kshitiz, MD   40 mg at 04/03/18 1046  . polyethylene glycol (MIRALAX / GLYCOLAX) packet 17 g  17 g Oral Daily PRN Aline August, MD      . senna-docusate (Senokot-S) tablet 1 tablet  1 tablet Oral QHS PRN Aline August, MD   1 tablet at 04/02/18 2222     Discharge Medications: Please see discharge summary for a list of discharge medications.  Relevant Imaging Results:  Relevant Lab Results:   Additional Information SS#: [id number]  Geralynn Ochs, LCSW

## 2018-04-04 MED ORDER — AMLODIPINE BESYLATE 5 MG PO TABS
5.0000 mg | ORAL_TABLET | Freq: Every day | ORAL | Status: DC
  Administered 2018-04-04: 5 mg via ORAL
  Filled 2018-04-04: qty 1

## 2018-04-04 NOTE — Progress Notes (Signed)
Patient picked up by EMS for discharge.  IV site removed and patient belongings turned over to transport.  In these items were 2 hearing aids in a pink cup that was shown and handed to Designer, fashion/clothing.

## 2018-04-04 NOTE — Care Management Important Message (Signed)
Important Message  Patient Details  Name: [name] MRN: [id number] Date of Birth: 29-Oct-1934 Do to illness atient could not sign.  Unsigned copy left in room.  Medicare Important Message Given:  Yes    Shiryl Ruddy 04/04/2018, 12:30 PM

## 2018-04-04 NOTE — Discharge Instructions (Signed)

## 2018-04-04 NOTE — NC FL2 (Signed)
[place] LEVEL OF CARE SCREENING TOOL     IDENTIFICATION  Patient Name: [name] Birthdate: [birth year] Sex: female Admission Date (Current Location): 04/01/2018  [facility] and [place] Number:  Herbalist and Address:  The [place]. [facility], [place] [address], [place], [place] [address]      Provider Number: [id number]  Attending Physician Name and Address:  Marcell Anger*  Relative Name and Phone Number:       Current Level of Care: Hospital Recommended Level of Care: [place] Prior Approval Number:    Date Approved/Denied:   PASRR Number: [id number]  Discharge Plan: Other (Comment)(ALF)    Current Diagnoses: Patient Active Problem List   Diagnosis Date Noted  . Palliative care by specialist   . Goals of care, counseling/discussion   . Advanced care planning/counseling discussion   . CVA (cerebral vascular accident) ([place]) 04/02/2018  . Acute CVA (cerebrovascular accident) ([name]) 04/02/2018  . Altered mental status, unspecified 04/01/2018  . Dementia ([place]) 04/01/2018  . Falls 02/21/2018  . Syncope, vasovagal 02/21/2018  . Hyponatremia 12/27/2017  . Traumatic arthritis of ankle, right 08/12/2017  . History of non-ST elevation myocardial infarction (NSTEMI) 12/17/2016  . Chronic anticoagulation 12/17/2016  . Chest pain with moderate risk for cardiac etiology 05/30/2015  . Internal and external hemorrhoids without complication 74/01/8785  . History of GI bleed 06/24/2014  . Chronic diastolic heart failure ([place]) 06/24/2014  . Chronic atrial fibrillation 06/24/2014  . Pre-syncope 05/05/2014  . Osteoarthritis of right knee 08/10/2013  . OSA on CPAP 06/16/2013  . Bradycardia 10/25/2010  . Fatigue 10/25/2010  . Dyspnea 09/18/2010  . Coronary artery disease   . Hypokalemia   . Incontinence of urine   . Stromal tumor of the stomach ([place])   . Dyslipidemia 05/06/2007  . Essential hypertension 05/06/2007   . GERD 05/06/2007  . IRRITABLE BOWEL SYNDROME 05/06/2007    Orientation RESPIRATION BLADDER Height & Weight     Self  Normal Incontinent Weight: 121 lb 4.1 oz (55 kg) Height:  5\' 6"  (167.6 cm)  BEHAVIORAL SYMPTOMS/MOOD NEUROLOGICAL BOWEL NUTRITION STATUS      Continent (regular)  AMBULATORY STATUS COMMUNICATION OF NEEDS Skin   Extensive Assist Verbally Normal                       Personal Care Assistance Level of Assistance  Bathing, Feeding, Dressing Bathing Assistance: Maximum assistance Feeding assistance: Limited assistance Dressing Assistance: Maximum assistance     Functional Limitations Info  Sight, Hearing, Speech Sight Info: Adequate Hearing Info: Adequate Speech Info: Adequate    SPECIAL CARE FACTORS FREQUENCY  PT (by licensed PT), OT (by licensed OT)     PT Frequency: 3x/wk with home health OT Frequency: 3x/wk with home health           Contractures Contractures Info: Not present    Additional Factors Info  Code Status, Allergies, Psychotropic Code Status Info: DNR Allergies Info: Detrol Tolterodine, Tranexamic Acid, Xarelto Rivaroxaban, Pradaxa Dabigatran Etexilate Mesylate Psychotropic Info: Cymbalta 40mg  daily at bed         Current Medications (04/04/2018):  This is the current hospital active medication list Current Facility-Administered Medications  Medication Dose Route Frequency Provider Last Rate Last Dose  .  stroke: mapping our early stages of recovery book   Does not apply Once Alekh, Kshitiz, MD      . 0.9 %  sodium chloride infusion   Intravenous Continuous  Aline August, MD 75 mL/hr at 04/04/18 0257    . acetaminophen (TYLENOL) tablet 650 mg  650 mg Oral Q4H PRN Aline August, MD   650 mg at 04/03/18 2123   Or  . acetaminophen (TYLENOL) solution 650 mg  650 mg Per Tube Q4H PRN Aline August, MD       Or  . acetaminophen (TYLENOL) suppository 650 mg  650 mg Rectal Q4H PRN Alekh, Kshitiz, MD      . albuterol (PROVENTIL) (2.5  MG/3ML) 0.083% nebulizer solution 2.5 mg  2.5 mg Nebulization Q2H PRN Alekh, Kshitiz, MD      . amLODipine (NORVASC) tablet 5 mg  5 mg Oral Daily Spongberg, Audie Pinto, MD   5 mg at 04/04/18 0916  . apixaban (ELIQUIS) tablet 2.5 mg  2.5 mg Oral BID Aline August, MD   2.5 mg at 04/04/18 5188  . atorvastatin (LIPITOR) tablet 20 mg  20 mg Oral Daily Aline August, MD   20 mg at 04/04/18 0917  . calcium carbonate (TUMS - dosed in mg elemental calcium) chewable tablet 200 mg of elemental calcium  1 tablet Oral TID PRN Aline August, MD      . DULoxetine (CYMBALTA) DR capsule 40 mg  40 mg Oral QHS Aline August, MD   40 mg at 04/03/18 2124  . feeding supplement (ENSURE ENLIVE) (ENSURE ENLIVE) liquid 237 mL  237 mL Oral BID BM Marcell Anger, MD   237 mL at 04/04/18 1419  . galantamine (RAZADYNE ER) 24 hr capsule 16 mg  16 mg Oral Q breakfast Starla Link, Kshitiz, MD   16 mg at 04/04/18 0927  . hydrALAZINE (APRESOLINE) injection 5 mg  5 mg Intravenous Q6H PRN Aline August, MD   5 mg at 04/03/18 1953  . metoprolol succinate (TOPROL-XL) 24 hr tablet 100 mg  100 mg Oral Daily Alekh, Kshitiz, MD   100 mg at 04/04/18 0919  . nitroGLYCERIN (NITROSTAT) SL tablet 0.4 mg  0.4 mg Sublingual Q5 min PRN Alekh, Kshitiz, MD      . pantoprazole (PROTONIX) EC tablet 40 mg  40 mg Oral Daily Starla Link, Kshitiz, MD   40 mg at 04/04/18 0921  . polyethylene glycol (MIRALAX / GLYCOLAX) packet 17 g  17 g Oral Daily PRN Aline August, MD   17 g at 04/03/18 1952  . senna-docusate (Senokot-S) tablet 1 tablet  1 tablet Oral QHS PRN Aline August, MD   1 tablet at 04/02/18 2222     Discharge Medications: TAKE these medications  albuterol(2.5 MG/3ML) 0.083% nebulizer solution Commonly known as: PROVENTIL Take 3 mLs (2.5 mg total) by nebulization every 2 (two) hours as needed for wheezing. What changed: when to take this   apixaban2.5 MG Tabs tablet Commonly known as: ELIQUIS Take 1 tablet (2.5 mg total) by  mouth 2 (two) times daily.   atorvastatin20 MG tablet Commonly known as: LIPITOR Take 20 mg by mouth every evening.   bismuth subsalicylate262 CZ/66AY suspension Commonly known as: PEPTO BISMOL Take 30 mLs by mouth every 6 (six) hours as needed (for nausea, indigestion, or upset stomach).   calcium carbonate500 MG chewable tablet Commonly known as: TUMS - dosed in mg elemental calcium Chew 1 tablet by mouth every 8 (eight) hours as needed for indigestion or heartburn.   DULoxetine20 MG capsule Commonly known as: CYMBALTA TAKE 1 CAPSULE AT NIGHT FOR 7 NIGHTS THEN TAKE 2 CAPSULES AT NIGHT What changed: See the new instructions.   esomeprazole40 MG packet Commonly known as: NEXIUM  Take 40 mg by mouth daily. DISSOLVE CONTENTS OF 1 PACKET IN 15ML OF WATER AND DRINK EVERY DAY   feeding supplement (ENSURE ENLIVE)Liqd Take 237 mLs by mouth 2 (two) times daily between meals. What changed:   how much to take  when to take this   galantamine16 MG 24 hr capsule Commonly known as: RAZADYNE ER Take 16 mg by mouth daily with breakfast.   magnesium oxide400 (241.3 Mg) MG tablet Commonly known as: MAG-OX Take 1 tablet (400 mg total) by mouth 2 (two) times daily.   metoprolol succinate100 MG 24 hr tablet Commonly known as: TOPROL-XL Take 1 tablet (100 mg total) daily by mouth. Take with or immediately following a meal.   nitroGLYCERIN0.4 MG SL tablet Commonly known as: NITROSTAT Place 1 tablet (0.4 mg total) under the tongue every 5 (five) minutes as needed for chest pain. What changed: when to take this   ondansetron4 MG disintegrating tablet Commonly known as: ZOFRAN-ODT Take 4 mg by mouth every 8 (eight) hours as needed for nausea or vomiting.   polyethylene glycolpacket Commonly known as: MIRALAX / GLYCOLAX Take 17 g by mouth daily as needed for mild constipation. What changed: additional instructions   potassium chloride SA20 MEQ  tablet Commonly known as: K-DUR,KLOR-CON Give potassium chloride 40 mEq p.o. x2 for a total of 80 meq on 02/21/2018, and give 40 mEq p.o. x1 on 02/22/2018 then 20 mEq p.o. x1 on 02/23/2018   ranitidine150 MG tablet Commonly known as: ZANTAC Take 150 mg by mouth daily.      Relevant Imaging Results:  Relevant Lab Results:   Additional Information SS#: [id number]  Geralynn Ochs, LCSW

## 2018-04-04 NOTE — Progress Notes (Signed)
Daily Progress Note   Patient Name: [name]       Date: 04/04/2018 DOB: 10/12/1934  Age: 84 y.o. MRN#: [id number] Attending Physician: Marcell Anger* Primary Care Physician: Crist Infante, MD Admit Date: 04/01/2018  Reason for Consultation/Follow-up: Establishing goals of care  Subjective:  patient is awake alert, resting in bed.  She is awaiting her breakfast Denies pain Tolerating medications crushed in applesauce.  SLP recommendations noted    Length of Stay: 2  Current Medications: Scheduled Meds:  .  stroke: mapping our early stages of recovery book   Does not apply Once  . amLODipine  5 mg Oral Daily  . apixaban  2.5 mg Oral BID  . atorvastatin  20 mg Oral Daily  . DULoxetine  40 mg Oral QHS  . feeding supplement (ENSURE ENLIVE)  237 mL Oral BID BM  . galantamine  16 mg Oral Q breakfast  . metoprolol succinate  100 mg Oral Daily  . pantoprazole  40 mg Oral Daily    Continuous Infusions: . sodium chloride 75 mL/hr at 04/04/18 0257    PRN Meds: acetaminophen **OR** acetaminophen (TYLENOL) oral liquid 160 mg/5 mL **OR** acetaminophen, albuterol, calcium carbonate, hydrALAZINE, nitroGLYCERIN, polyethylene glycol, senna-docusate  Physical Exam         Awake alert No distress Has cast on R upper extremity No edema Has some memory loss, word finding difficulty S1 S 2 Clear Abdomen is not distended  Vital Signs: BP 128/73 (BP Location: Left Arm)   Pulse 67   Temp 98 F (36.7 C) (Axillary)   Resp 12   Ht 5\' 6"  (1.676 m)   Wt 55 kg   SpO2 100%   BMI 19.57 kg/m  SpO2: SpO2: 100 % O2 Device: O2 Device: Room Air O2 Flow Rate:    Intake/output summary:   Intake/Output Summary (Last 24 hours) at 04/04/2018 1626 Last data filed at 04/04/2018  0800 Gross per 24 hour  Intake 2219.12 ml  Output 700 ml  Net 1519.12 ml   LBM: Last BM Date: (PTA) Baseline Weight: Weight: 55 kg Most recent weight: Weight: 55 kg PPS 30%      Palliative Assessment/Data:    Flowsheet Rows     Most Recent Value  Intake Tab  Referral Department  Hospitalist  Unit at Time of  Referral  ER  Palliative Care Primary Diagnosis  Neurology  Date Notified  04/01/18  Palliative Care Type  New Palliative care  Reason for referral  Clarify Goals of Care  Date of Admission  04/01/18  Date first seen by Palliative Care  04/02/18  # of days Palliative referral response time  1 Day(s)  # of days IP prior to Palliative referral  0  Clinical Assessment  Psychosocial & Spiritual Assessment  Palliative Care Outcomes      Patient Active Problem List   Diagnosis Date Noted  . Palliative care by specialist   . Goals of care, counseling/discussion   . Advanced care planning/counseling discussion   . CVA (cerebral vascular accident) ([place]) 04/02/2018  . Acute CVA (cerebrovascular accident) ([place]) 04/02/2018  . Altered mental status, unspecified 04/01/2018  . Dementia ([place]) 04/01/2018  . Falls 02/21/2018  . Syncope, vasovagal 02/21/2018  . Hyponatremia 12/27/2017  . Traumatic arthritis of ankle, right 08/12/2017  . History of non-ST elevation myocardial infarction (NSTEMI) 12/17/2016  . Chronic anticoagulation 12/17/2016  . Chest pain with moderate risk for cardiac etiology 05/30/2015  . Internal and external hemorrhoids without complication 16/11/9602  . History of GI bleed 06/24/2014  . Chronic diastolic heart failure ([place]) 06/24/2014  . Chronic atrial fibrillation 06/24/2014  . Pre-syncope 05/05/2014  . Osteoarthritis of right knee 08/10/2013  . OSA on CPAP 06/16/2013  . Bradycardia 10/25/2010  . Fatigue 10/25/2010  . Dyspnea 09/18/2010  . Coronary artery disease   . Hypokalemia   . Incontinence of urine   . Stromal tumor of the stomach ([place])    . Dyslipidemia 05/06/2007  . Essential hypertension 05/06/2007  . GERD 05/06/2007  . IRRITABLE BOWEL SYNDROME 05/06/2007    Palliative Care Assessment & Plan   Patient Profile:    Assessment:  84 yo lady with dementia, chronic A fib on Eliquis, HTN HLD CAD. Patient with recurrent falls, ongoing decline since Christmas 2019, fall with R distal radius and ulna fracture. Patient was in independent living, after fall and RUE fracture, she went to rehab. For the past one week prior to current hospitalization, she was at assisted living at [facility].  1. Acute vs early sub acute stroke, based on recent MRI. Call placed and discussed about MRI findings with brother [name] HCPOA agent.  2. Recent wrist fracture 3. Chronic A fib, HTN HLD 4. Gradual progressive decline since the past 1-2 months or so.  Recommendations/Plan:   continue to monitor from PT OT and SLP standpoint, continue neurological evaluation and appropriate stroke work up.   Monitor PO intake, encourage safe PO.   Disposition: SNF rehab with palliative care soon. No additional PMT specific recommendations at this time.   Code Status:    Code Status Orders  (From admission, onward)         Start     Ordered   04/01/18 1639  Do not attempt resuscitation (DNR)  Continuous    Question Answer Comment  In the event of cardiac or respiratory ARREST Do not call a "code blue"   In the event of cardiac or respiratory ARREST Do not perform Intubation, CPR, defibrillation or ACLS   In the event of cardiac or respiratory ARREST Use medication by any route, position, wound care, and other measures to relive pain and suffering. May use oxygen, suction and manual treatment of airway obstruction as needed for comfort.      04/01/18 1643        Code  Status History    Date Active Date Inactive Code Status Order ID Comments User Context   02/21/2018 1827 02/28/2018 1606 Full Code [id number]  Roxan Hockey, MD Inpatient    12/27/2017 1636 12/29/2017 1934 Full Code [id number]  Mariel Aloe, MD Inpatient   06/24/2014 1857 2020/02/2514 2019 Full Code [id number]  Annita Brod, MD Inpatient   08/10/2013 1555 08/12/2013 1821 Full Code [id number]  Erlene Senters, PA-C Inpatient    Advance Directive Documentation     Most Recent Value  Type of Advance Directive  Healthcare Power of Attorney  Pre-existing out of facility DNR order (yellow form or pink MOST form)  -  "MOST" Form in Place?  -       Prognosis:   Unable to determine  Discharge Planning:   SNF rehab with palliative     Care plan was discussed with  Patient, also discussed with her brother over the phone.   Thank you for allowing the Palliative Medicine Team to assist in the care of this patient.   Time In: 9 Time Out: 9.25 Total Time 25 Prolonged Time Billed  no       Greater than 50%  of this time was spent counseling and coordinating care related to the above assessment and plan.  Loistine Chance, MD [phone] Please contact Palliative Medicine Team phone at [phone] for questions and concerns.

## 2018-04-04 NOTE — Progress Notes (Signed)
Pt finally calm down and remained quiet in bed after the haldol iv given   Bilateral hand  mittens applied to prevent her from pulling off medical devices such as  Telemetry leads & IV line. No acute distress at present. BP WNL .RN will continue to monitor.

## 2018-04-04 NOTE — Plan of Care (Signed)
Adequate for discharge.

## 2018-04-04 NOTE — Progress Notes (Signed)
Discharge to: [facility] Anticipated discharge date: 04/04/18 Family notified: Yes, POA Bill by phone Transportation by: [facility]  Report #: [phone]  [place] signing off.  Laveda Abbe LCSW [phone]

## 2018-04-04 NOTE — Progress Notes (Signed)
Physical Therapy Treatment Patient Details Name: [name] MRN: [id number] DOB: [birth year] Today's Date: 04/04/2018    History of Present Illness [name] is an 84 y.o. female with medical history significant of dementia, chronic atrial fibrillation on Eliquis, hypertension, hyperlipidemia, coronary artery disease, right distal radius and ulna fracture currently with a cast, recurrent falls who was sent from her doctor's office with a concern for stroke.  MRI revealed an acute infarct of the R putamen.    PT Comments    Pt remains very limited secondary to weakness, fatigue and cognitive deficits. All VSS throughout this session with bed mobility and transfers. Pt would continue to benefit from skilled physical therapy services at this time while admitted and after d/c to address the below listed limitations in order to improve overall safety and independence with functional mobility.    Follow Up Recommendations  SNF     Equipment Recommendations  None recommended by PT    Recommendations for Other Services       Precautions / Restrictions Precautions Precautions: Fall Restrictions Weight Bearing Restrictions: No    Mobility  Bed Mobility Overal bed mobility: Needs Assistance Bed Mobility: Supine to Sit     Supine to sit: Max assist;+2 for physical assistance     General bed mobility comments: increased time and effort, multimodal cueing to perform task, heavy assistance with use of bed pads to position pt's hips at EOB  Transfers Overall transfer level: Needs assistance Equipment used: 2 person hand held assist Transfers: Squat Pivot Transfers     Squat pivot transfers: Total assist;+2 physical assistance     General transfer comment: face-to-face with gait belt and bed pad, squat-pivot towards pt's R side  Ambulation/Gait                 Stairs             Wheelchair Mobility    Modified Rankin (Stroke Patients Only) Modified Rankin  (Stroke Patients Only) Pre-Morbid Rankin Score: Moderate disability Modified Rankin: Moderately severe disability     Balance Overall balance assessment: Needs assistance Sitting-balance support: Feet supported Sitting balance-Leahy Scale: Poor Sitting balance - Comments: mod-max A to maintain upright sitting at EOB Postural control: Posterior lean;Right lateral lean Standing balance support: Bilateral upper extremity supported Standing balance-Leahy Scale: Zero                              Cognition Arousal/Alertness: Awake/alert Behavior During Therapy: Flat affect Overall Cognitive Status: Impaired/Different from baseline Area of Impairment: Orientation;Attention;Memory;Following commands;Awareness;Safety/judgement;Problem solving                 Orientation Level: Disoriented to;Time;Situation Current Attention Level: Focused Memory: Decreased recall of precautions;Decreased short-term memory Following Commands: Follows one step commands inconsistently;Follows one step commands with increased time Safety/Judgement: Decreased awareness of safety;Decreased awareness of deficits Awareness: Intellectual Problem Solving: Slow processing;Decreased initiation;Difficulty sequencing;Requires verbal cues;Requires tactile cues        Exercises      General Comments        Pertinent Vitals/Pain Pain Assessment: Faces Faces Pain Scale: No hurt    Home Living                      Prior Function            PT Goals (current goals can now be found in the care plan section) Acute Rehab PT Goals  PT Goal Formulation: Patient unable to participate in goal setting Time For Goal Achievement: 04/16/18 Potential to Achieve Goals: Fair Progress towards PT goals: Progressing toward goals    Frequency    Min 3X/week      PT Plan Current plan remains appropriate    Co-evaluation              AM-PAC PT "6 Clicks" Mobility   Outcome  Measure  Help needed turning from your back to your side while in a flat bed without using bedrails?: A Lot Help needed moving from lying on your back to sitting on the side of a flat bed without using bedrails?: A Lot Help needed moving to and from a bed to a chair (including a wheelchair)?: Total Help needed standing up from a chair using your arms (e.g., wheelchair or bedside chair)?: A Lot Help needed to walk in hospital room?: Total Help needed climbing 3-5 steps with a railing? : Total 6 Click Score: 9    End of Session Equipment Utilized During Treatment: Gait belt Activity Tolerance: Patient limited by fatigue Patient left: in chair;with call bell/phone within reach;with chair alarm set Nurse Communication: Mobility status PT Visit Diagnosis: Other abnormalities of gait and mobility (R26.89);Other symptoms and signs involving the nervous system (R29.898)     Time: 1610-9604 PT Time Calculation (min) (ACUTE ONLY): 23 min  Charges:  $Therapeutic Activity: 23-37 mins                     Sherie Don, [place], DPT  Acute Rehabilitation Services Pager [phone] Office [place] 04/04/2018, 4:01 PM

## 2018-04-04 NOTE — Discharge Summary (Signed)
Physician Discharge Summary  [name] MWN:[id number] DOB: [birth year] DOA: 04/01/2018  PCP: Crist Infante, MD  Admit date: 04/01/2018 Discharge date: 04/04/2018  Admitted From: Inpatient Disposition: ALF  Recommendations for Outpatient Follow-up:  1. Follow up with PCP in 1-2 weeks 2. Please obtain BMP/CBC in one week 3. Please follow up on the following pending results:  Home Health:No Equipment/Devices:NO  Discharge Condition:Stable CODE STATUS:DNR Diet recommendation: Regular healthy diet  Brief/Interim Summary: Sabrina Fuhrmann Sheltonis a 84 y.o.femalewith medical history significant ofdementia, chronic atrial fibrillation on Eliquis, hypertension, hyperlipidemia, coronary artery disease, right distal radius and ulna fracture currently with a cast, recurrent falls who was sent from her doctor's office with a concern for stroke. Patient is confused from her dementia and does not provide much history. Relatives/family members at bedside provides some history. Patient was discharged to skilled nursing facility in early January 2020 for rehab for recurrent falls and right distal radius and ulna fracture. Subsequently she was discharged back to assisted living facility almost a week ago. Family members are concerned that her mental status has progressively declined with rapid decline in her oral intake and her weight as well. Over the last couple of days, she was very slow to respond. There was no known seizure-like activities or bowel bladder incontinence. No recent fever, nausea or vomiting reported. At her doctor's office, there was a question of right facial droop so she was sent to the ED for concern for stroke.  Acute stroke putamen: Patient was seen by neurology in consultation.  CT of head was negative, MRI was positive for acute stroke as noted above.  Echo did not identify any acute thrombus.  An EEG was done which showed moderate global slowing, B12 TSH and ammonia were normal.   Nephrology's recommendations were for continuing Eliquis and statin.  No additional recommendations until patient was stable to be discharged home to assisted living facility.  Chronic A. fib was well controlled on current nodal blocking agents.  Continue Eliquis on discharge further follow-up through her primary care physician  Hypertension patient episodic high blood pressure this appears to be more related to anxiety and mild agitation.  Most recent blood pressure 120/73.  Patient will be to home medications with outpatient titration through her primary care physician  Hyperlipidemia.  Continue patient's statin without change  Recent right distal radius and ulnar fracture.  Patient currently has a cast on her right arm.  She will follow-up with orthopedics as an outpatient.   Discharge Diagnoses:  Principal Problem:   Altered mental status, unspecified Active Problems:   Dyslipidemia   Essential hypertension   Chronic atrial fibrillation   Dementia (HCC)   CVA (cerebral vascular accident) ([place])   Acute CVA (cerebrovascular accident) [facility])   Palliative care by specialist   Goals of care, counseling/discussion   Advanced care planning/counseling discussion    Discharge Instructions  Discharge Instructions    Diet - low sodium heart healthy   Complete by:  As directed    Increase activity slowly   Complete by:  As directed      Allergies as of 04/04/2018      Reactions   Detrol [tolterodine] Other (See Comments)   Critical reaction-per MD office   Tranexamic Acid Other (See Comments)   Patient ineligible for Tranexamic acid due to hx of NSTEMI and CAD.   Xarelto [rivaroxaban] Other (See Comments)   Critical reaction   Pradaxa [dabigatran Etexilate Mesylate] Nausea And Vomiting, Other (See Comments)   Moderate  reaction-per MD      Medication List    TAKE these medications   albuterol (2.5 MG/3ML) 0.083% nebulizer solution Commonly known as:  PROVENTIL Take 3 mLs (2.5  mg total) by nebulization every 2 (two) hours as needed for wheezing. What changed:  when to take this   apixaban 2.5 MG Tabs tablet Commonly known as:  ELIQUIS Take 1 tablet (2.5 mg total) by mouth 2 (two) times daily.   atorvastatin 20 MG tablet Commonly known as:  LIPITOR Take 20 mg by mouth every evening.   bismuth subsalicylate 403 KV/42VZ suspension Commonly known as:  PEPTO BISMOL Take 30 mLs by mouth every 6 (six) hours as needed (for nausea, indigestion, or upset stomach).   calcium carbonate 500 MG chewable tablet Commonly known as:  TUMS - dosed in mg elemental calcium Chew 1 tablet by mouth every 8 (eight) hours as needed for indigestion or heartburn.   DULoxetine 20 MG capsule Commonly known as:  CYMBALTA TAKE 1 CAPSULE AT NIGHT FOR 7 NIGHTS THEN TAKE 2 CAPSULES AT NIGHT What changed:  See the new instructions.   esomeprazole 40 MG packet Commonly known as:  NEXIUM Take 40 mg by mouth daily. DISSOLVE CONTENTS OF 1 PACKET IN 15ML OF WATER AND DRINK EVERY DAY   feeding supplement (ENSURE ENLIVE) Liqd Take 237 mLs by mouth 2 (two) times daily between meals. What changed:    how much to take  when to take this   galantamine 16 MG 24 hr capsule Commonly known as:  RAZADYNE ER Take 16 mg by mouth daily with breakfast.   magnesium oxide 400 (241.3 Mg) MG tablet Commonly known as:  MAG-OX Take 1 tablet (400 mg total) by mouth 2 (two) times daily.   metoprolol succinate 100 MG 24 hr tablet Commonly known as:  TOPROL-XL Take 1 tablet (100 mg total) daily by mouth. Take with or immediately following a meal.   nitroGLYCERIN 0.4 MG SL tablet Commonly known as:  NITROSTAT Place 1 tablet (0.4 mg total) under the tongue every 5 (five) minutes as needed for chest pain. What changed:  when to take this   ondansetron 4 MG disintegrating tablet Commonly known as:  ZOFRAN-ODT Take 4 mg by mouth every 8 (eight) hours as needed for nausea or vomiting.   polyethylene  glycol packet Commonly known as:  MIRALAX / GLYCOLAX Take 17 g by mouth daily as needed for mild constipation. What changed:  additional instructions   potassium chloride SA 20 MEQ tablet Commonly known as:  K-DUR,KLOR-CON Give potassium chloride 40 mEq p.o. x2 for a total of 80 meq on 02/21/2018, and give 40 mEq p.o. x1 on 02/22/2018 then 20 mEq p.o. x1 on 02/23/2018   ranitidine 150 MG tablet Commonly known as:  ZANTAC Take 150 mg by mouth daily.       Allergies  Allergen Reactions  . Detrol [Tolterodine] Other (See Comments)    Critical reaction-per MD office  . Tranexamic Acid Other (See Comments)    Patient ineligible for Tranexamic acid due to hx of NSTEMI and CAD.  [name] Xarelto [Rivaroxaban] Other (See Comments)    Critical reaction  . Pradaxa [Dabigatran Etexilate Mesylate] Nausea And Vomiting and Other (See Comments)    Moderate reaction-per MD    Consultations:  NEURO   Procedures/Studies: Ct Head Wo Contrast  Result Date: 04/01/2018 CLINICAL DATA:  Longstanding neuro deficit EXAM: CT HEAD WITHOUT CONTRAST TECHNIQUE: Contiguous axial images were obtained from the base of the skull  through the vertex without intravenous contrast. COMPARISON:  02/21/2018 FINDINGS: Brain: Atrophic and chronic white matter ischemic changes are noted similar to that seen on the prior exam. No findings to suggest acute hemorrhage, acute infarction or space-occupying mass lesion are seen. Vascular: No hyperdense vessel or unexpected calcification. Skull: Normal. Negative for fracture or focal lesion. Sinuses/Orbits: No acute finding. Other: None. IMPRESSION: Chronic atrophic and ischemic changes without acute abnormality. Electronically Signed   By: Inez Catalina M.D.   On: 04/01/2018 15:13   Mr Virgel Paling FO Contrast  Result Date: 04/02/2018 CLINICAL DATA:  84 y/o  F; small stroke for follow-up. EXAM: MRA HEAD WITHOUT CONTRAST TECHNIQUE: Angiographic images of the Circle of Willis were obtained  using MRA technique without intravenous contrast. COMPARISON:  04/01/2018 MRI head. FINDINGS: Internal carotid arteries:  Patent. Anterior cerebral arteries:  Patent. Middle cerebral arteries: Patent. Anterior communicating artery: Patent. Posterior communicating arteries: Fetal right PCA. No left posterior communicating artery identified, likely hypoplastic or absent. Posterior cerebral arteries:  Patent. Basilar artery:  Patent. Vertebral arteries:  Patent. Motion degraded study. Suboptimal assessment for subtle stenosis, small (2-3 mm) aneurysm, or small vessels. No large vessel occlusion, large aneurysm, or high-grade stenosis identified. IMPRESSION: Motion degraded study. Suboptimal assessment for subtle stenosis, small (2-3 mm) aneurysm, or small vessels. No large vessel occlusion, large aneurysm, or high-grade stenosis identified. Electronically Signed   By: Kristine Garbe M.D.   On: 04/02/2018 17:48   Mr Brain Wo Contrast  Result Date: 04/01/2018 CLINICAL DATA:  84 y/o F; right facial droop and leading to the right. History of dementia. EXAM: MRI HEAD WITHOUT CONTRAST TECHNIQUE: Multiplanar, multiecho pulse sequences of the brain and surrounding structures were obtained without intravenous contrast. COMPARISON:  04/01/2018 CT head.  09/13/2016 MRI head. FINDINGS: Brain: Motion degradation of multiple sequences. Punctate focus of reduced diffusion within the right putamen (series 5, image 7, series 7, image 52, and series 8, image 20) compatible with acute/early subacute infarction. No hemorrhage or mass effect. Moderate for age chronic microvascular ischemic changes and volume loss of the brain, progressed from 7/18. Small focus of susceptibility hypointensity within the left occipital lobe compatible hemosiderin deposition of chronic microhemorrhage. No mass effect, extra-axial collection, hydrocephalus, or herniation. Vascular: Normal flow voids. Skull and upper cervical spine: Normal marrow  signal. Sinuses/Orbits: Mild mucosal thickening of the anterior ethmoid air cells. Partial opacification of the mastoid tips. Bilateral intra-ocular lens replacements. Other: None. IMPRESSION: 1. Punctate focus of acute/early subacute infarction in the right putamen. No hemorrhage or mass effect. 2. Progression of chronic microvascular ischemic changes and volume loss of the brain from prior MRI. These results will be called to the ordering clinician or representative by the Radiologist Assistant, and communication documented in the PACS or zVision Dashboard. Electronically Signed   By: Kristine Garbe M.D.   On: 04/01/2018 22:35   Dg Chest Portable 1 View  Result Date: 04/01/2018 CLINICAL DATA:  Altered mental status EXAM: PORTABLE CHEST 1 VIEW COMPARISON:  02/21/2018 and prior chest radiographs FINDINGS: Cardiomegaly again noted. There is no evidence of focal airspace disease, pulmonary edema, suspicious pulmonary nodule/mass, pleural effusion, or pneumothorax. No acute bony abnormalities are identified. IMPRESSION: Cardiomegaly without evidence of acute cardiopulmonary disease. Electronically Signed   By: Margarette Canada M.D.   On: 04/01/2018 16:41       Subjective:   Discharge Exam: Vitals:   04/04/18 0727 04/04/18 1130  BP: (!) 181/81 128/73  Pulse: 87 67  Resp: (!) 23 12  Temp: 97.8 F (36.6 C) 98 F (36.7 C)  SpO2: 99% 100%   Vitals:   04/03/18 2329 04/04/18 0326 04/04/18 0727 04/04/18 1130  BP: (!) 187/99 131/84 (!) 181/81 128/73  Pulse: 72 76 87 67  Resp: 15 11 (!) 23 12  Temp: 98.2 F (36.8 C) 98.4 F (36.9 C) 97.8 F (36.6 C) 98 F (36.7 C)  TempSrc: Axillary Axillary Axillary Axillary  SpO2: 98% 97% 99% 100%  Weight:      Height:        General: Pt is alert, awake, not in acute distress Cardiovascular: RRR, S1/S2 +, no rubs, no gallops Respiratory: CTA bilaterally, no wheezing, no rhonchi Abdominal: Soft, NT, ND, bowel sounds + Extremities: no edema, no  cyanosis    The results of significant diagnostics from this hospitalization (including imaging, microbiology, ancillary and laboratory) are listed below for reference.     Microbiology: Recent Results (from the past 240 hour(s))  Urine culture     Status: None   Collection Time: 04/01/18  3:27 PM  Result Value Ref Range Status   Specimen Description URINE, CATHETERIZED  Final   Special Requests NONE  Final   Culture   Final    NO GROWTH Performed at [facility], [address] [place], [place] [address]    Report Status 04/02/2018 FINAL  Final     Labs: BNP (last 3 results) No results for input(s): BNP in the last 8760 hours. Basic Metabolic Panel: Recent Labs  Lab 04/01/18 1205 04/02/18 0604  NA 136 136  K 3.8 3.2*  CL 95* 96*  CO2 28 27  GLUCOSE 106* 96  BUN 15 10  CREATININE 0.97 0.63  CALCIUM 10.4* 9.8   Liver Function Tests: Recent Labs  Lab 04/01/18 1205 04/02/18 0604  AST 21 18  ALT 9 10  ALKPHOS 82 75  BILITOT 1.3* 1.6*  PROT 7.1 6.5  ALBUMIN 3.6 3.3*   No results for input(s): LIPASE, AMYLASE in the last 168 hours. Recent Labs  Lab 04/02/18 0604  AMMONIA 18   CBC: Recent Labs  Lab 04/01/18 1205 04/02/18 0604  WBC 6.9 5.2  NEUTROABS 4.6 3.2  HGB 14.5 13.7  HCT 45.1 41.9  43.7  MCV 89.8 87.1  PLT 208 225   Cardiac Enzymes: No results for input(s): CKTOTAL, CKMB, CKMBINDEX, TROPONINI in the last 168 hours. BNP: Invalid input(s): POCBNP CBG: Recent Labs  Lab 04/01/18 1158  GLUCAP 102*   D-Dimer No results for input(s): DDIMER in the last 72 hours. Hgb A1c Recent Labs    04/02/18 0604 04/03/18 0527  HGBA1C 5.4 5.4   Lipid Profile Recent Labs    04/02/18 0604 04/03/18 0527  CHOL 144 119  HDL 35* 32*  LDLCALC 98 76  TRIG 54 53  CHOLHDL 4.1 3.7   Thyroid function studies Recent Labs    04/02/18 0604  TSH 1.398   Anemia work up Recent Labs    04/02/18 0604  VITAMINB12 450   Urinalysis     Component Value Date/Time   COLORURINE YELLOW 04/01/2018 1525   APPEARANCEUR HAZY (A) 04/01/2018 1525   LABSPEC 1.017 04/01/2018 1525   PHURINE 6.0 04/01/2018 [place] 04/01/2018 1525   HGBUR NEGATIVE 04/01/2018 [facility] 04/01/2018 1525   KETONESUR NEGATIVE 04/01/2018 1525   PROTEINUR NEGATIVE 04/01/2018 1525   UROBILINOGEN 1.0 09/08/2007 1024   NITRITE NEGATIVE 04/01/2018 1525   LEUKOCYTESUR NEGATIVE 04/01/2018 1525   Sepsis  Labs Invalid input(s): PROCALCITONIN,  WBC,  LACTICIDVEN Microbiology Recent Results (from the past 240 hour(s))  Urine culture     Status: None   Collection Time: 04/01/18  3:27 PM  Result Value Ref Range Status   Specimen Description URINE, CATHETERIZED  Final   Special Requests NONE  Final   Culture   Final    NO GROWTH Performed at [facility], [address] [place], [place] [address]    Report Status 04/02/2018 FINAL  Final     Time coordinating discharge: Over 30 minutes  SIGNED:   Nicolette Bang, MD  [facility] 04/04/2018, 1:49 PM Pager   If 7PM-7AM, please contact night-coverage [email or web address] Password [email or web address]

## 2018-04-04 NOTE — NC FL2 (Signed)
[place] LEVEL OF CARE SCREENING TOOL     IDENTIFICATION  Patient Name: [name] Birthdate: 08-19-1934 Sex: female Admission Date (Current Location): 04/01/2018  [facility] and [place] Number:  Herbalist and Address:  [facility] [place] [address], [place], [place] [address]      Provider Number: [id number]  Attending Physician Name and Address:  Marcell Anger*  Relative Name and Phone Number:       Current Level of Care: Hospital Recommended Level of Care: [place] Prior Approval Number:    Date Approved/Denied:   PASRR Number: [id number]  Discharge Plan: Other (Comment)(ALF)    Current Diagnoses: Patient Active Problem List   Diagnosis Date Noted  . Palliative care by specialist   . Goals of care, counseling/discussion   . Advanced care planning/counseling discussion   . CVA (cerebral vascular accident) ([place]) 04/02/2018  . Acute CVA (cerebrovascular accident) ([place]) 04/02/2018  . Altered mental status, unspecified 04/01/2018  . Dementia ([place]) 04/01/2018  . Falls 02/21/2018  . Syncope, vasovagal 02/21/2018  . Hyponatremia 12/27/2017  . Traumatic arthritis of ankle, right 08/12/2017  . History of non-ST elevation myocardial infarction (NSTEMI) 12/17/2016  . Chronic anticoagulation 12/17/2016  . Chest pain with moderate risk for cardiac etiology 05/30/2015  . Internal and external hemorrhoids without complication 99/35/7017  . History of GI bleed 06/24/2014  . Chronic diastolic heart failure ([place]) 06/24/2014  . Chronic atrial fibrillation 06/24/2014  . Pre-syncope 05/05/2014  . Osteoarthritis of right knee 08/10/2013  . OSA on CPAP 06/16/2013  . Bradycardia 10/25/2010  . Fatigue 10/25/2010  . Dyspnea 09/18/2010  . Coronary artery disease   . Hypokalemia   . Incontinence of urine   . Stromal tumor of the stomach ([place])   . Dyslipidemia 05/06/2007  . Essential hypertension 05/06/2007   . GERD 05/06/2007  . IRRITABLE BOWEL SYNDROME 05/06/2007    Orientation RESPIRATION BLADDER Height & Weight     Self  Normal Incontinent Weight: 121 lb 4.1 oz (55 kg) Height:  5\' 6"  (167.6 cm)  BEHAVIORAL SYMPTOMS/MOOD NEUROLOGICAL BOWEL NUTRITION STATUS      Continent (regular)  AMBULATORY STATUS COMMUNICATION OF NEEDS Skin   Extensive Assist Verbally Normal                       Personal Care Assistance Level of Assistance  Bathing, Feeding, Dressing Bathing Assistance: Maximum assistance Feeding assistance: Limited assistance Dressing Assistance: Maximum assistance     Functional Limitations Info  Sight, Hearing, Speech Sight Info: Adequate Hearing Info: Adequate Speech Info: Adequate    SPECIAL CARE FACTORS FREQUENCY  PT (by licensed PT), OT (by licensed OT)     PT Frequency: 3x/wk with home health OT Frequency: 3x/wk with home health           Contractures Contractures Info: Not present    Additional Factors Info  Code Status, Allergies, Psychotropic Code Status Info: DNR Allergies Info: Detrol Tolterodine, Tranexamic Acid, Xarelto Rivaroxaban, Pradaxa Dabigatran Etexilate Mesylate Psychotropic Info: Cymbalta 40mg  daily at bed         Current Medications (04/04/2018):  This is the current hospital active medication list Current Facility-Administered Medications  Medication Dose Route Frequency Provider Last Rate Last Dose  .  stroke: mapping our early stages of recovery book   Does not apply Once Alekh, Kshitiz, MD      . 0.9 %  sodium chloride infusion   Intravenous Continuous  Aline August, MD 75 mL/hr at 04/04/18 0257    . acetaminophen (TYLENOL) tablet 650 mg  650 mg Oral Q4H PRN Aline August, MD   650 mg at 04/03/18 2123   Or  . acetaminophen (TYLENOL) solution 650 mg  650 mg Per Tube Q4H PRN Aline August, MD       Or  . acetaminophen (TYLENOL) suppository 650 mg  650 mg Rectal Q4H PRN Alekh, Kshitiz, MD      . albuterol (PROVENTIL) (2.5  MG/3ML) 0.083% nebulizer solution 2.5 mg  2.5 mg Nebulization Q2H PRN Alekh, Kshitiz, MD      . amLODipine (NORVASC) tablet 5 mg  5 mg Oral Daily Spongberg, Audie Pinto, MD   5 mg at 04/04/18 0916  . apixaban (ELIQUIS) tablet 2.5 mg  2.5 mg Oral BID Aline August, MD   2.5 mg at 04/04/18 1610  . atorvastatin (LIPITOR) tablet 20 mg  20 mg Oral Daily Aline August, MD   20 mg at 04/04/18 0917  . calcium carbonate (TUMS - dosed in mg elemental calcium) chewable tablet 200 mg of elemental calcium  1 tablet Oral TID PRN Aline August, MD      . DULoxetine (CYMBALTA) DR capsule 40 mg  40 mg Oral QHS Aline August, MD   40 mg at 04/03/18 2124  . feeding supplement (ENSURE ENLIVE) (ENSURE ENLIVE) liquid 237 mL  237 mL Oral BID BM Marcell Anger, MD   237 mL at 04/04/18 1419  . galantamine (RAZADYNE ER) 24 hr capsule 16 mg  16 mg Oral Q breakfast Starla Link, Kshitiz, MD   16 mg at 04/04/18 0927  . hydrALAZINE (APRESOLINE) injection 5 mg  5 mg Intravenous Q6H PRN Aline August, MD   5 mg at 04/03/18 1953  . metoprolol succinate (TOPROL-XL) 24 hr tablet 100 mg  100 mg Oral Daily Alekh, Kshitiz, MD   100 mg at 04/04/18 0919  . nitroGLYCERIN (NITROSTAT) SL tablet 0.4 mg  0.4 mg Sublingual Q5 min PRN Alekh, Kshitiz, MD      . pantoprazole (PROTONIX) EC tablet 40 mg  40 mg Oral Daily Starla Link, Kshitiz, MD   40 mg at 04/04/18 0921  . polyethylene glycol (MIRALAX / GLYCOLAX) packet 17 g  17 g Oral Daily PRN Aline August, MD   17 g at 04/03/18 1952  . senna-docusate (Senokot-S) tablet 1 tablet  1 tablet Oral QHS PRN Aline August, MD   1 tablet at 04/02/18 2222     Discharge Medications: TAKE these medications   albuterol (2.5 MG/3ML) 0.083% nebulizer solution Commonly known as:  PROVENTIL Take 3 mLs (2.5 mg total) by nebulization every 2 (two) hours as needed for wheezing. What changed:  when to take this   apixaban 2.5 MG Tabs tablet Commonly known as:  ELIQUIS Take 1 tablet (2.5 mg total) by  mouth 2 (two) times daily.   atorvastatin 20 MG tablet Commonly known as:  LIPITOR Take 20 mg by mouth every evening.   bismuth subsalicylate 960 AV/40JW suspension Commonly known as:  PEPTO BISMOL Take 30 mLs by mouth every 6 (six) hours as needed (for nausea, indigestion, or upset stomach).   calcium carbonate 500 MG chewable tablet Commonly known as:  TUMS - dosed in mg elemental calcium Chew 1 tablet by mouth every 8 (eight) hours as needed for indigestion or heartburn.   DULoxetine 20 MG capsule Commonly known as:  CYMBALTA TAKE 1 CAPSULE AT NIGHT FOR 7 NIGHTS THEN TAKE 2 CAPSULES AT NIGHT What  changed:  See the new instructions.   esomeprazole 40 MG packet Commonly known as:  NEXIUM Take 40 mg by mouth daily. DISSOLVE CONTENTS OF 1 PACKET IN 15ML OF WATER AND DRINK EVERY DAY   feeding supplement (ENSURE ENLIVE) Liqd Take 237 mLs by mouth 2 (two) times daily between meals. What changed:    how much to take  when to take this   galantamine 16 MG 24 hr capsule Commonly known as:  RAZADYNE ER Take 16 mg by mouth daily with breakfast.   magnesium oxide 400 (241.3 Mg) MG tablet Commonly known as:  MAG-OX Take 1 tablet (400 mg total) by mouth 2 (two) times daily.   metoprolol succinate 100 MG 24 hr tablet Commonly known as:  TOPROL-XL Take 1 tablet (100 mg total) daily by mouth. Take with or immediately following a meal.   nitroGLYCERIN 0.4 MG SL tablet Commonly known as:  NITROSTAT Place 1 tablet (0.4 mg total) under the tongue every 5 (five) minutes as needed for chest pain. What changed:  when to take this   ondansetron 4 MG disintegrating tablet Commonly known as:  ZOFRAN-ODT Take 4 mg by mouth every 8 (eight) hours as needed for nausea or vomiting.   polyethylene glycol packet Commonly known as:  MIRALAX / GLYCOLAX Take 17 g by mouth daily as needed for mild constipation. What changed:  additional instructions   potassium chloride SA 20 MEQ  tablet Commonly known as:  K-DUR,KLOR-CON Give potassium chloride 40 mEq p.o. x2 for a total of 80 meq on 02/21/2018, and give 40 mEq p.o. x1 on 02/22/2018 then 20 mEq p.o. x1 on 02/23/2018   ranitidine 150 MG tablet Commonly known as:  ZANTAC Take 150 mg by mouth daily.      Relevant Imaging Results:  Relevant Lab Results:   Additional Information SS#: [id number]  Geralynn Ochs, LCSW

## 2018-04-04 NOTE — Plan of Care (Signed)
  Problem: Coping: Goal: Will identify appropriate support needs Outcome: Progressing   Problem: Health Behavior/Discharge Planning: Goal: Ability to manage health-related needs will improve Outcome: Progressing   Problem: Education: Goal: Individualized Educational Video(s) Outcome: Progressing

## 2018-06-27 DEATH — deceased
